# Patient Record
Sex: Female | Born: 1980 | Race: Black or African American | Hispanic: No | Marital: Married | Smoking: Never smoker
Health system: Southern US, Community
[De-identification: ages and names within clinical notes are randomized; demographics above are authoritative.]

## PROBLEM LIST (undated history)

## (undated) ENCOUNTER — Ambulatory Visit (HOSPITAL_COMMUNITY): Admission: EM | Payer: Medicare Other | Source: Home / Self Care

## (undated) DIAGNOSIS — D649 Anemia, unspecified: Secondary | ICD-10-CM

## (undated) DIAGNOSIS — F419 Anxiety disorder, unspecified: Secondary | ICD-10-CM

## (undated) DIAGNOSIS — J45909 Unspecified asthma, uncomplicated: Secondary | ICD-10-CM

## (undated) DIAGNOSIS — I209 Angina pectoris, unspecified: Secondary | ICD-10-CM

## (undated) DIAGNOSIS — R51 Headache: Secondary | ICD-10-CM

## (undated) DIAGNOSIS — G709 Myoneural disorder, unspecified: Secondary | ICD-10-CM

## (undated) DIAGNOSIS — K219 Gastro-esophageal reflux disease without esophagitis: Secondary | ICD-10-CM

## (undated) DIAGNOSIS — F603 Borderline personality disorder: Secondary | ICD-10-CM

## (undated) DIAGNOSIS — G039 Meningitis, unspecified: Secondary | ICD-10-CM

## (undated) DIAGNOSIS — F32A Depression, unspecified: Secondary | ICD-10-CM

## (undated) DIAGNOSIS — IMO0002 Reserved for concepts with insufficient information to code with codable children: Secondary | ICD-10-CM

## (undated) DIAGNOSIS — F329 Major depressive disorder, single episode, unspecified: Secondary | ICD-10-CM

## (undated) HISTORY — PX: WISDOM TOOTH EXTRACTION: SHX21

## (undated) HISTORY — PX: ABDOMINAL HYSTERECTOMY: SHX81

## (undated) HISTORY — PX: TUBAL LIGATION: SHX77

## (undated) HISTORY — PX: PERIPHERALLY INSERTED CENTRAL CATHETER INSERTION: SHX2221

---
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
## 1999-01-19 ENCOUNTER — Encounter: Admission: RE | Admit: 1999-01-19 | Discharge: 1999-01-19 | Payer: Self-pay | Admitting: Family Medicine

## 1999-02-18 ENCOUNTER — Encounter: Admission: RE | Admit: 1999-02-18 | Discharge: 1999-02-18 | Payer: Self-pay | Admitting: Sports Medicine

## 1999-03-30 ENCOUNTER — Encounter: Admission: RE | Admit: 1999-03-30 | Discharge: 1999-03-30 | Payer: Self-pay | Admitting: Family Medicine

## 1999-04-07 ENCOUNTER — Encounter: Admission: RE | Admit: 1999-04-07 | Discharge: 1999-04-07 | Payer: Self-pay | Admitting: Family Medicine

## 1999-04-13 ENCOUNTER — Encounter: Admission: RE | Admit: 1999-04-13 | Discharge: 1999-04-13 | Payer: Self-pay | Admitting: Family Medicine

## 1999-05-13 ENCOUNTER — Encounter: Admission: RE | Admit: 1999-05-13 | Discharge: 1999-05-13 | Payer: Self-pay | Admitting: Family Medicine

## 1999-07-13 ENCOUNTER — Encounter: Admission: RE | Admit: 1999-07-13 | Discharge: 1999-07-13 | Payer: Self-pay | Admitting: Family Medicine

## 1999-08-04 ENCOUNTER — Encounter: Admission: RE | Admit: 1999-08-04 | Discharge: 1999-08-04 | Payer: Self-pay | Admitting: Family Medicine

## 1999-08-31 ENCOUNTER — Encounter: Admission: RE | Admit: 1999-08-31 | Discharge: 1999-08-31 | Payer: Self-pay | Admitting: Family Medicine

## 1999-09-23 ENCOUNTER — Encounter: Admission: RE | Admit: 1999-09-23 | Discharge: 1999-09-23 | Payer: Self-pay | Admitting: Family Medicine

## 1999-10-23 ENCOUNTER — Encounter (INDEPENDENT_AMBULATORY_CARE_PROVIDER_SITE_OTHER): Payer: Self-pay | Admitting: *Deleted

## 1999-10-23 LAB — CONVERTED CEMR LAB

## 1999-10-25 ENCOUNTER — Other Ambulatory Visit: Admission: RE | Admit: 1999-10-25 | Discharge: 1999-10-25 | Payer: Self-pay | Admitting: *Deleted

## 1999-10-25 ENCOUNTER — Encounter: Admission: RE | Admit: 1999-10-25 | Discharge: 1999-10-25 | Payer: Self-pay | Admitting: Family Medicine

## 1999-12-20 ENCOUNTER — Encounter: Admission: RE | Admit: 1999-12-20 | Discharge: 1999-12-20 | Payer: Self-pay | Admitting: Sports Medicine

## 2000-01-24 ENCOUNTER — Encounter: Admission: RE | Admit: 2000-01-24 | Discharge: 2000-01-24 | Payer: Self-pay | Admitting: Family Medicine

## 2000-01-24 ENCOUNTER — Ambulatory Visit (HOSPITAL_COMMUNITY): Admission: RE | Admit: 2000-01-24 | Discharge: 2000-01-24 | Payer: Self-pay | Admitting: Family Medicine

## 2000-06-30 ENCOUNTER — Emergency Department (HOSPITAL_COMMUNITY): Admission: EM | Admit: 2000-06-30 | Discharge: 2000-06-30 | Payer: Self-pay | Admitting: *Deleted

## 2000-07-16 ENCOUNTER — Inpatient Hospital Stay (HOSPITAL_COMMUNITY): Admission: AD | Admit: 2000-07-16 | Discharge: 2000-07-16 | Payer: Self-pay | Admitting: Obstetrics

## 2000-11-20 ENCOUNTER — Encounter: Admission: RE | Admit: 2000-11-20 | Discharge: 2000-11-20 | Payer: Self-pay | Admitting: Sports Medicine

## 2000-12-13 ENCOUNTER — Encounter: Admission: RE | Admit: 2000-12-13 | Discharge: 2000-12-13 | Payer: Self-pay | Admitting: Family Medicine

## 2000-12-20 ENCOUNTER — Other Ambulatory Visit: Admission: RE | Admit: 2000-12-20 | Discharge: 2000-12-20 | Payer: Self-pay | Admitting: Family Medicine

## 2000-12-20 ENCOUNTER — Encounter: Admission: RE | Admit: 2000-12-20 | Discharge: 2000-12-20 | Payer: Self-pay | Admitting: Sports Medicine

## 2000-12-27 ENCOUNTER — Emergency Department (HOSPITAL_COMMUNITY): Admission: EM | Admit: 2000-12-27 | Discharge: 2000-12-27 | Payer: Self-pay | Admitting: Emergency Medicine

## 2001-04-23 ENCOUNTER — Encounter: Admission: RE | Admit: 2001-04-23 | Discharge: 2001-04-23 | Payer: Self-pay | Admitting: Family Medicine

## 2001-06-10 ENCOUNTER — Inpatient Hospital Stay (HOSPITAL_COMMUNITY): Admission: AD | Admit: 2001-06-10 | Discharge: 2001-06-10 | Payer: Self-pay | Admitting: *Deleted

## 2001-07-06 ENCOUNTER — Inpatient Hospital Stay (HOSPITAL_COMMUNITY): Admission: AD | Admit: 2001-07-06 | Discharge: 2001-07-06 | Payer: Self-pay | Admitting: Obstetrics and Gynecology

## 2001-07-06 ENCOUNTER — Encounter: Payer: Self-pay | Admitting: Obstetrics and Gynecology

## 2001-09-03 ENCOUNTER — Inpatient Hospital Stay (HOSPITAL_COMMUNITY): Admission: AD | Admit: 2001-09-03 | Discharge: 2001-09-03 | Payer: Self-pay | Admitting: *Deleted

## 2001-10-18 ENCOUNTER — Encounter: Payer: Self-pay | Admitting: *Deleted

## 2001-10-18 ENCOUNTER — Inpatient Hospital Stay (HOSPITAL_COMMUNITY): Admission: AD | Admit: 2001-10-18 | Discharge: 2001-10-18 | Payer: Self-pay | Admitting: Obstetrics and Gynecology

## 2001-11-01 ENCOUNTER — Inpatient Hospital Stay (HOSPITAL_COMMUNITY): Admission: RE | Admit: 2001-11-01 | Discharge: 2001-11-01 | Payer: Self-pay | Admitting: *Deleted

## 2001-11-01 ENCOUNTER — Encounter: Payer: Self-pay | Admitting: *Deleted

## 2001-12-13 ENCOUNTER — Inpatient Hospital Stay (HOSPITAL_COMMUNITY): Admission: AD | Admit: 2001-12-13 | Discharge: 2001-12-13 | Payer: Self-pay | Admitting: *Deleted

## 2002-06-07 ENCOUNTER — Inpatient Hospital Stay (HOSPITAL_COMMUNITY): Admission: AD | Admit: 2002-06-07 | Discharge: 2002-06-07 | Payer: Self-pay | Admitting: *Deleted

## 2002-06-21 ENCOUNTER — Inpatient Hospital Stay (HOSPITAL_COMMUNITY): Admission: AD | Admit: 2002-06-21 | Discharge: 2002-06-24 | Payer: Self-pay | Admitting: Obstetrics

## 2003-01-24 ENCOUNTER — Encounter: Payer: Self-pay | Admitting: Emergency Medicine

## 2003-01-24 ENCOUNTER — Emergency Department (HOSPITAL_COMMUNITY): Admission: EM | Admit: 2003-01-24 | Discharge: 2003-01-25 | Payer: Self-pay | Admitting: Emergency Medicine

## 2003-01-25 ENCOUNTER — Encounter: Payer: Self-pay | Admitting: Emergency Medicine

## 2003-12-08 ENCOUNTER — Inpatient Hospital Stay (HOSPITAL_COMMUNITY): Admission: AD | Admit: 2003-12-08 | Discharge: 2003-12-08 | Payer: Self-pay | Admitting: Obstetrics

## 2003-12-14 ENCOUNTER — Inpatient Hospital Stay (HOSPITAL_COMMUNITY): Admission: AD | Admit: 2003-12-14 | Discharge: 2003-12-16 | Payer: Self-pay | Admitting: Obstetrics

## 2003-12-15 ENCOUNTER — Encounter (INDEPENDENT_AMBULATORY_CARE_PROVIDER_SITE_OTHER): Payer: Self-pay | Admitting: *Deleted

## 2005-06-12 ENCOUNTER — Inpatient Hospital Stay (HOSPITAL_COMMUNITY): Admission: AD | Admit: 2005-06-12 | Discharge: 2005-06-13 | Payer: Self-pay | Admitting: Obstetrics

## 2005-07-03 ENCOUNTER — Emergency Department (HOSPITAL_COMMUNITY): Admission: EM | Admit: 2005-07-03 | Discharge: 2005-07-03 | Payer: Self-pay | Admitting: Family Medicine

## 2005-07-17 ENCOUNTER — Emergency Department (HOSPITAL_COMMUNITY): Admission: EM | Admit: 2005-07-17 | Discharge: 2005-07-17 | Payer: Self-pay | Admitting: Family Medicine

## 2005-10-21 ENCOUNTER — Emergency Department (HOSPITAL_COMMUNITY): Admission: EM | Admit: 2005-10-21 | Discharge: 2005-10-21 | Payer: Self-pay | Admitting: Emergency Medicine

## 2006-02-14 ENCOUNTER — Emergency Department (HOSPITAL_COMMUNITY): Admission: EM | Admit: 2006-02-14 | Discharge: 2006-02-15 | Payer: Self-pay | Admitting: Emergency Medicine

## 2006-04-05 ENCOUNTER — Encounter: Admission: RE | Admit: 2006-04-05 | Discharge: 2006-04-05 | Payer: Self-pay | Admitting: Internal Medicine

## 2006-06-22 ENCOUNTER — Encounter (INDEPENDENT_AMBULATORY_CARE_PROVIDER_SITE_OTHER): Payer: Self-pay | Admitting: *Deleted

## 2006-07-02 ENCOUNTER — Emergency Department (HOSPITAL_COMMUNITY): Admission: EM | Admit: 2006-07-02 | Discharge: 2006-07-02 | Payer: Self-pay | Admitting: Emergency Medicine

## 2006-07-03 ENCOUNTER — Emergency Department (HOSPITAL_COMMUNITY): Admission: EM | Admit: 2006-07-03 | Discharge: 2006-07-03 | Payer: Self-pay | Admitting: Family Medicine

## 2007-05-03 ENCOUNTER — Emergency Department (HOSPITAL_COMMUNITY): Admission: EM | Admit: 2007-05-03 | Discharge: 2007-05-03 | Payer: Self-pay | Admitting: Emergency Medicine

## 2007-06-18 ENCOUNTER — Emergency Department (HOSPITAL_COMMUNITY): Admission: EM | Admit: 2007-06-18 | Discharge: 2007-06-18 | Payer: Self-pay | Admitting: Emergency Medicine

## 2008-01-30 ENCOUNTER — Emergency Department (HOSPITAL_COMMUNITY): Admission: EM | Admit: 2008-01-30 | Discharge: 2008-01-30 | Payer: Self-pay | Admitting: Emergency Medicine

## 2008-01-31 ENCOUNTER — Ambulatory Visit: Payer: Self-pay | Admitting: Gynecology

## 2008-02-16 ENCOUNTER — Emergency Department (HOSPITAL_COMMUNITY): Admission: EM | Admit: 2008-02-16 | Discharge: 2008-02-16 | Payer: Self-pay | Admitting: Emergency Medicine

## 2008-04-08 ENCOUNTER — Ambulatory Visit: Payer: Self-pay | Admitting: Gynecology

## 2008-04-08 ENCOUNTER — Encounter: Payer: Self-pay | Admitting: Gynecology

## 2008-04-08 ENCOUNTER — Other Ambulatory Visit: Admission: RE | Admit: 2008-04-08 | Discharge: 2008-04-08 | Payer: Self-pay | Admitting: Gynecology

## 2008-04-29 ENCOUNTER — Inpatient Hospital Stay (HOSPITAL_COMMUNITY): Admission: RE | Admit: 2008-04-29 | Discharge: 2008-05-01 | Payer: Self-pay | Admitting: Gynecology

## 2008-04-29 ENCOUNTER — Encounter: Payer: Self-pay | Admitting: Gynecology

## 2008-04-29 ENCOUNTER — Ambulatory Visit: Payer: Self-pay | Admitting: Gynecology

## 2008-05-04 ENCOUNTER — Ambulatory Visit: Payer: Self-pay | Admitting: Gynecology

## 2008-05-18 ENCOUNTER — Ambulatory Visit: Payer: Self-pay | Admitting: Gynecology

## 2008-06-09 ENCOUNTER — Ambulatory Visit: Payer: Self-pay | Admitting: Gynecology

## 2008-09-29 ENCOUNTER — Observation Stay (HOSPITAL_COMMUNITY): Admission: EM | Admit: 2008-09-29 | Discharge: 2008-09-30 | Payer: Self-pay | Admitting: Emergency Medicine

## 2008-09-30 ENCOUNTER — Encounter: Payer: Self-pay | Admitting: Cardiology

## 2009-03-19 ENCOUNTER — Emergency Department (HOSPITAL_COMMUNITY): Admission: EM | Admit: 2009-03-19 | Discharge: 2009-03-19 | Payer: Self-pay | Admitting: Family Medicine

## 2009-08-15 ENCOUNTER — Emergency Department (HOSPITAL_COMMUNITY): Admission: EM | Admit: 2009-08-15 | Discharge: 2009-08-16 | Payer: Self-pay | Admitting: Emergency Medicine

## 2009-11-29 ENCOUNTER — Emergency Department (HOSPITAL_COMMUNITY): Admission: EM | Admit: 2009-11-29 | Discharge: 2009-11-29 | Payer: Self-pay | Admitting: Emergency Medicine

## 2010-05-08 ENCOUNTER — Emergency Department (HOSPITAL_COMMUNITY)
Admission: EM | Admit: 2010-05-08 | Discharge: 2010-05-08 | Payer: Self-pay | Source: Home / Self Care | Admitting: Emergency Medicine

## 2010-05-09 LAB — BASIC METABOLIC PANEL
BUN: 7 mg/dL (ref 6–23)
CO2: 27 mEq/L (ref 19–32)
Calcium: 9.4 mg/dL (ref 8.4–10.5)
Chloride: 107 mEq/L (ref 96–112)
Creatinine, Ser: 0.74 mg/dL (ref 0.4–1.2)
GFR calc Af Amer: >60 mL/min (ref >60)
GFR calc non Af Amer: >60 mL/min (ref >60)
Glucose, Bld: 76 mg/dL (ref 70–99)
Potassium: 3.3 mEq/L — ABNORMAL LOW (ref 3.5–5.1)
Sodium: 140 mEq/L (ref 135–145)

## 2010-05-09 LAB — CBC
HCT: 39 % (ref 36.0–46.0)
Hemoglobin: 13.2 g/dL (ref 12.0–15.0)
MCH: 30.4 pg (ref 26.0–34.0)
MCHC: 33.8 g/dL (ref 30.0–36.0)
MCV: 89.9 fL (ref 78.0–100.0)
Platelets: 230 10*3/uL (ref 150–400)
RBC: 4.34 MIL/uL (ref 3.87–5.11)
RDW: 12.2 % (ref 11.5–15.5)
WBC: 3.3 10*3/uL — ABNORMAL LOW (ref 4.0–10.5)

## 2010-05-09 LAB — POCT CARDIAC MARKERS
CKMB, poc: <1 ng/mL — ABNORMAL LOW (ref 1.0–8.0)
CKMB, poc: <1 ng/mL — ABNORMAL LOW (ref 1.0–8.0)
Myoglobin, poc: 56.6 ng/mL (ref 12–200)
Myoglobin, poc: 44.9 ng/mL (ref 12–200)
Troponin i, poc: <0.05 ng/mL (ref 0.00–0.09)
Troponin i, poc: <0.05 ng/mL (ref 0.00–0.09)

## 2010-05-09 LAB — URINALYSIS, ROUTINE W REFLEX MICROSCOPIC
Bilirubin Urine: NEGATIVE
Hgb urine dipstick: NEGATIVE
Ketones, ur: NEGATIVE mg/dL
Nitrite: NEGATIVE
Protein, ur: NEGATIVE mg/dL
Specific Gravity, Urine: 1.022 (ref 1.005–1.030)
Urine Glucose, Fasting: NEGATIVE mg/dL
Urobilinogen, UA: 0.2 mg/dL (ref 0.0–1.0)
pH: 7 (ref 5.0–8.0)

## 2010-05-09 LAB — DIFFERENTIAL
Basophils Absolute: 0 10*3/uL (ref 0.0–0.1)
Basophils Relative: 1 % (ref 0–1)
Eosinophils Absolute: 0.2 10*3/uL (ref 0.0–0.7)
Eosinophils Relative: 6 % — ABNORMAL HIGH (ref 0–5)
Lymphocytes Relative: 44 % (ref 12–46)
Lymphs Abs: 1.5 10*3/uL (ref 0.7–4.0)
Monocytes Absolute: 0.2 10*3/uL (ref 0.1–1.0)
Monocytes Relative: 7 % (ref 3–12)
Neutro Abs: 1.4 10*3/uL — ABNORMAL LOW (ref 1.7–7.7)
Neutrophils Relative %: 42 % — ABNORMAL LOW (ref 43–77)

## 2010-05-09 LAB — POCT PREGNANCY, URINE: Preg Test, Ur: NEGATIVE

## 2010-05-15 ENCOUNTER — Encounter: Payer: Self-pay | Admitting: Internal Medicine

## 2010-06-27 ENCOUNTER — Emergency Department (HOSPITAL_COMMUNITY)
Admission: EM | Admit: 2010-06-27 | Discharge: 2010-06-28 | Disposition: A | Discharge status: Home / Self Care | Payer: Medicaid Other | Attending: Emergency Medicine | Admitting: Emergency Medicine

## 2010-06-27 DIAGNOSIS — T50901A Poisoning by unspecified drugs, medicaments and biological substances, accidental (unintentional), initial encounter: Secondary | ICD-10-CM | POA: Insufficient documentation

## 2010-06-27 DIAGNOSIS — F3289 Other specified depressive episodes: Secondary | ICD-10-CM | POA: Insufficient documentation

## 2010-06-27 DIAGNOSIS — F329 Major depressive disorder, single episode, unspecified: Secondary | ICD-10-CM | POA: Insufficient documentation

## 2010-06-27 DIAGNOSIS — T50904A Poisoning by unspecified drugs, medicaments and biological substances, undetermined, initial encounter: Secondary | ICD-10-CM | POA: Insufficient documentation

## 2010-06-27 LAB — COMPREHENSIVE METABOLIC PANEL
ALT: <8 U/L (ref 0–35)
AST: 14 U/L (ref 0–37)
Albumin: 3.1 g/dL — ABNORMAL LOW (ref 3.5–5.2)
Alkaline Phosphatase: 56 U/L (ref 39–117)
BUN: 7 mg/dL (ref 6–23)
CO2: 26 mEq/L (ref 19–32)
Calcium: 8.1 mg/dL — ABNORMAL LOW (ref 8.4–10.5)
Chloride: 109 mEq/L (ref 96–112)
Creatinine, Ser: 0.63 mg/dL (ref 0.4–1.2)
GFR calc Af Amer: >60 mL/min (ref >60)
GFR calc non Af Amer: >60 mL/min (ref >60)
Glucose, Bld: 97 mg/dL (ref 70–99)
Potassium: 3.3 mEq/L — ABNORMAL LOW (ref 3.5–5.1)
Sodium: 139 mEq/L (ref 135–145)
Total Bilirubin: 0.1 mg/dL — ABNORMAL LOW (ref 0.3–1.2)
Total Protein: 6.1 g/dL (ref 6.0–8.3)

## 2010-06-27 LAB — URINALYSIS, ROUTINE W REFLEX MICROSCOPIC
Bilirubin Urine: NEGATIVE
Glucose, UA: NEGATIVE mg/dL
Hgb urine dipstick: NEGATIVE
Ketones, ur: NEGATIVE mg/dL
Nitrite: NEGATIVE
Protein, ur: NEGATIVE mg/dL
Specific Gravity, Urine: 1.026 (ref 1.005–1.030)
Urobilinogen, UA: 0.2 mg/dL (ref 0.0–1.0)
pH: 7 (ref 5.0–8.0)

## 2010-06-27 LAB — DIFFERENTIAL
Basophils Absolute: 0 10*3/uL (ref 0.0–0.1)
Basophils Relative: 1 % (ref 0–1)
Eosinophils Absolute: 0.2 10*3/uL (ref 0.0–0.7)
Eosinophils Relative: 5 % (ref 0–5)
Lymphocytes Relative: 40 % (ref 12–46)
Lymphs Abs: 1.7 10*3/uL (ref 0.7–4.0)
Monocytes Absolute: 0.3 10*3/uL (ref 0.1–1.0)
Monocytes Relative: 6 % (ref 3–12)
Neutro Abs: 2.1 10*3/uL (ref 1.7–7.7)
Neutrophils Relative %: 49 % (ref 43–77)

## 2010-06-27 LAB — ACETAMINOPHEN LEVEL
Acetaminophen (Tylenol), Serum: <10 ug/mL — ABNORMAL LOW (ref 10–30)
Acetaminophen (Tylenol), Serum: <10 ug/mL — ABNORMAL LOW (ref 10–30)

## 2010-06-27 LAB — ETHANOL: Alcohol, Ethyl (B): <5 mg/dL (ref 0–10)

## 2010-06-27 LAB — CBC
HCT: 33.3 % — ABNORMAL LOW (ref 36.0–46.0)
Hemoglobin: 10.8 g/dL — ABNORMAL LOW (ref 12.0–15.0)
MCH: 28.7 pg (ref 26.0–34.0)
MCHC: 32.4 g/dL (ref 30.0–36.0)
MCV: 88.6 fL (ref 78.0–100.0)
Platelets: 179 10*3/uL (ref 150–400)
RBC: 3.76 MIL/uL — ABNORMAL LOW (ref 3.87–5.11)
RDW: 12.2 % (ref 11.5–15.5)
WBC: 4.3 10*3/uL (ref 4.0–10.5)

## 2010-06-27 LAB — POCT PREGNANCY, URINE: Preg Test, Ur: NEGATIVE

## 2010-06-27 LAB — RAPID URINE DRUG SCREEN, HOSP PERFORMED
Amphetamines: NOT DETECTED
Barbiturates: NOT DETECTED
Benzodiazepines: POSITIVE — AB
Cocaine: NOT DETECTED
Opiates: NOT DETECTED
Tetrahydrocannabinol: NOT DETECTED

## 2010-06-27 LAB — SALICYLATE LEVEL: Salicylate Lvl: <4 mg/dL (ref 2.8–20.0)

## 2010-06-28 DIAGNOSIS — F339 Major depressive disorder, recurrent, unspecified: Secondary | ICD-10-CM

## 2010-07-08 LAB — DIFFERENTIAL
Basophils Absolute: 0 10*3/uL (ref 0.0–0.1)
Basophils Relative: 1 % (ref 0–1)
Eosinophils Absolute: 0.3 10*3/uL (ref 0.0–0.7)
Eosinophils Relative: 5 % (ref 0–5)
Lymphocytes Relative: 34 % (ref 12–46)
Lymphs Abs: 2 10*3/uL (ref 0.7–4.0)
Monocytes Absolute: 0.3 10*3/uL (ref 0.1–1.0)
Monocytes Relative: 6 % (ref 3–12)
Neutro Abs: 3.2 10*3/uL (ref 1.7–7.7)
Neutrophils Relative %: 55 % (ref 43–77)

## 2010-07-08 LAB — BASIC METABOLIC PANEL
BUN: 4 mg/dL — ABNORMAL LOW (ref 6–23)
CO2: 25 mEq/L (ref 19–32)
Calcium: 9.4 mg/dL (ref 8.4–10.5)
Chloride: 105 mEq/L (ref 96–112)
Creatinine, Ser: 0.71 mg/dL (ref 0.4–1.2)
GFR calc Af Amer: >60 mL/min (ref >60)
GFR calc non Af Amer: >60 mL/min (ref >60)
Glucose, Bld: 93 mg/dL (ref 70–99)
Potassium: 3.8 mEq/L (ref 3.5–5.1)
Sodium: 136 mEq/L (ref 135–145)

## 2010-07-08 LAB — TROPONIN I: Troponin I: 0.01 ng/mL (ref 0.00–0.06)

## 2010-07-08 LAB — CBC
HCT: 37 % (ref 36.0–46.0)
Hemoglobin: 12.7 g/dL (ref 12.0–15.0)
MCH: 30.2 pg (ref 26.0–34.0)
MCHC: 34.3 g/dL (ref 30.0–36.0)
MCV: 88.1 fL (ref 78.0–100.0)
Platelets: 227 10*3/uL (ref 150–400)
RBC: 4.2 MIL/uL (ref 3.87–5.11)
RDW: 12.3 % (ref 11.5–15.5)
WBC: 5.9 10*3/uL (ref 4.0–10.5)

## 2010-07-08 LAB — CK TOTAL AND CKMB (NOT AT ARMC)
CK, MB: 1.1 ng/mL (ref 0.3–4.0)
Relative Index: INVALID (ref 0.0–2.5)
Total CK: 79 U/L (ref 7–177)

## 2010-07-08 LAB — POCT CARDIAC MARKERS
CKMB, poc: <1 ng/mL — ABNORMAL LOW (ref 1.0–8.0)
Myoglobin, poc: 45.5 ng/mL (ref 12–200)
Troponin i, poc: <0.05 ng/mL (ref 0.00–0.09)

## 2010-07-12 LAB — DIFFERENTIAL
Basophils Absolute: 0 10*3/uL (ref 0.0–0.1)
Basophils Relative: 1 % (ref 0–1)
Eosinophils Absolute: 0.4 10*3/uL (ref 0.0–0.7)
Eosinophils Relative: 6 % — ABNORMAL HIGH (ref 0–5)
Lymphocytes Relative: 41 % (ref 12–46)
Lymphs Abs: 2.6 10*3/uL (ref 0.7–4.0)
Monocytes Absolute: 0.6 10*3/uL (ref 0.1–1.0)
Monocytes Relative: 9 % (ref 3–12)
Neutro Abs: 2.7 10*3/uL (ref 1.7–7.7)
Neutrophils Relative %: 43 % (ref 43–77)

## 2010-07-12 LAB — CBC
HCT: 36.5 % (ref 36.0–46.0)
Hemoglobin: 12.7 g/dL (ref 12.0–15.0)
MCHC: 34.8 g/dL (ref 30.0–36.0)
MCV: 90.8 fL (ref 78.0–100.0)
Platelets: 248 10*3/uL (ref 150–400)
RBC: 4.02 MIL/uL (ref 3.87–5.11)
RDW: 12.3 % (ref 11.5–15.5)
WBC: 6.2 10*3/uL (ref 4.0–10.5)

## 2010-07-12 LAB — POCT CARDIAC MARKERS
CKMB, poc: <1 ng/mL — ABNORMAL LOW (ref 1.0–8.0)
Myoglobin, poc: 35.4 ng/mL (ref 12–200)
Troponin i, poc: <0.05 ng/mL (ref 0.00–0.09)

## 2010-07-12 LAB — BASIC METABOLIC PANEL
BUN: 12 mg/dL (ref 6–23)
CO2: 28 mEq/L (ref 19–32)
Calcium: 9.4 mg/dL (ref 8.4–10.5)
Chloride: 106 mEq/L (ref 96–112)
Creatinine, Ser: 0.81 mg/dL (ref 0.4–1.2)
GFR calc Af Amer: >60 mL/min (ref >60)
GFR calc non Af Amer: >60 mL/min (ref >60)
Glucose, Bld: 94 mg/dL (ref 70–99)
Potassium: 3.4 mEq/L — ABNORMAL LOW (ref 3.5–5.1)
Sodium: 137 mEq/L (ref 135–145)

## 2010-08-01 LAB — COMPREHENSIVE METABOLIC PANEL
ALT: 9 U/L (ref 0–35)
AST: 26 U/L (ref 0–37)
Albumin: 3.7 g/dL (ref 3.5–5.2)
Alkaline Phosphatase: 60 U/L (ref 39–117)
BUN: 5 mg/dL — ABNORMAL LOW (ref 6–23)
CO2: 25 mEq/L (ref 19–32)
Calcium: 8.9 mg/dL (ref 8.4–10.5)
Chloride: 110 mEq/L (ref 96–112)
Creatinine, Ser: 0.74 mg/dL (ref 0.4–1.2)
GFR calc Af Amer: >60 mL/min (ref >60)
GFR calc non Af Amer: >60 mL/min (ref >60)
Glucose, Bld: 117 mg/dL — ABNORMAL HIGH (ref 70–99)
Potassium: 3.5 mEq/L (ref 3.5–5.1)
Sodium: 139 mEq/L (ref 135–145)
Total Bilirubin: 0.7 mg/dL (ref 0.3–1.2)
Total Protein: 6.9 g/dL (ref 6.0–8.3)

## 2010-08-01 LAB — DIFFERENTIAL
Basophils Absolute: 0 10*3/uL (ref 0.0–0.1)
Basophils Absolute: 0 10*3/uL (ref 0.0–0.1)
Basophils Relative: 1 % (ref 0–1)
Basophils Relative: 1 % (ref 0–1)
Eosinophils Absolute: 0.2 10*3/uL (ref 0.0–0.7)
Eosinophils Absolute: 0.2 10*3/uL (ref 0.0–0.7)
Eosinophils Relative: 5 % (ref 0–5)
Eosinophils Relative: 5 % (ref 0–5)
Lymphocytes Relative: 48 % — ABNORMAL HIGH (ref 12–46)
Lymphocytes Relative: 50 % — ABNORMAL HIGH (ref 12–46)
Lymphs Abs: 2.1 10*3/uL (ref 0.7–4.0)
Lymphs Abs: 2.2 10*3/uL (ref 0.7–4.0)
Monocytes Absolute: 0.3 10*3/uL (ref 0.1–1.0)
Monocytes Absolute: 0.4 10*3/uL (ref 0.1–1.0)
Monocytes Relative: 7 % (ref 3–12)
Monocytes Relative: 8 % (ref 3–12)
Neutro Abs: 1.6 10*3/uL — ABNORMAL LOW (ref 1.7–7.7)
Neutro Abs: 1.8 10*3/uL (ref 1.7–7.7)
Neutrophils Relative %: 38 % — ABNORMAL LOW (ref 43–77)
Neutrophils Relative %: 39 % — ABNORMAL LOW (ref 43–77)

## 2010-08-01 LAB — CBC
HCT: 35.7 % — ABNORMAL LOW (ref 36.0–46.0)
HCT: 37.5 % (ref 36.0–46.0)
Hemoglobin: 12.3 g/dL (ref 12.0–15.0)
Hemoglobin: 13 g/dL (ref 12.0–15.0)
MCHC: 34.4 g/dL (ref 30.0–36.0)
MCHC: 34.7 g/dL (ref 30.0–36.0)
MCV: 88.3 fL (ref 78.0–100.0)
MCV: 88.5 fL (ref 78.0–100.0)
Platelets: 205 10*3/uL (ref 150–400)
Platelets: 226 10*3/uL (ref 150–400)
RBC: 4.04 MIL/uL (ref 3.87–5.11)
RBC: 4.24 MIL/uL (ref 3.87–5.11)
RDW: 12.4 % (ref 11.5–15.5)
RDW: 12.8 % (ref 11.5–15.5)
WBC: 4.3 10*3/uL (ref 4.0–10.5)
WBC: 4.6 10*3/uL (ref 4.0–10.5)

## 2010-08-01 LAB — BASIC METABOLIC PANEL
BUN: 7 mg/dL (ref 6–23)
CO2: 24 mEq/L (ref 19–32)
Calcium: 9.3 mg/dL (ref 8.4–10.5)
Chloride: 106 mEq/L (ref 96–112)
Creatinine, Ser: 0.74 mg/dL (ref 0.4–1.2)
GFR calc Af Amer: >60 mL/min (ref >60)
GFR calc non Af Amer: >60 mL/min (ref >60)
Glucose, Bld: 98 mg/dL (ref 70–99)
Potassium: 3.4 mEq/L — ABNORMAL LOW (ref 3.5–5.1)
Sodium: 138 mEq/L (ref 135–145)

## 2010-08-01 LAB — POCT CARDIAC MARKERS
CKMB, poc: <1 ng/mL — ABNORMAL LOW (ref 1.0–8.0)
CKMB, poc: <1 ng/mL — ABNORMAL LOW (ref 1.0–8.0)
Myoglobin, poc: 105 ng/mL (ref 12–200)
Myoglobin, poc: 59.9 ng/mL (ref 12–200)
Troponin i, poc: <0.05 ng/mL (ref 0.00–0.09)
Troponin i, poc: <0.05 ng/mL (ref 0.00–0.09)

## 2010-08-01 LAB — CK TOTAL AND CKMB (NOT AT ARMC)
CK, MB: 1.3 ng/mL (ref 0.3–4.0)
Relative Index: 0.6 (ref 0.0–2.5)
Total CK: 219 U/L — ABNORMAL HIGH (ref 7–177)

## 2010-08-01 LAB — CARDIAC PANEL(CRET KIN+CKTOT+MB+TROPI)
CK, MB: 1 ng/mL (ref 0.3–4.0)
CK, MB: 1.2 ng/mL (ref 0.3–4.0)
Relative Index: 0.5 (ref 0.0–2.5)
Relative Index: 0.6 (ref 0.0–2.5)
Total CK: 206 U/L — ABNORMAL HIGH (ref 7–177)
Total CK: 211 U/L — ABNORMAL HIGH (ref 7–177)
Troponin I: 0.01 ng/mL (ref 0.00–0.06)
Troponin I: <0.01 ng/mL (ref 0.00–0.06)

## 2010-08-01 LAB — CK: Total CK: 219 U/L — ABNORMAL HIGH (ref 7–177)

## 2010-08-01 LAB — LIPID PANEL
Cholesterol: 132 mg/dL (ref 0–200)
HDL: 43 mg/dL (ref >39)
LDL Cholesterol: 82 mg/dL (ref 0–99)
Total CHOL/HDL Ratio: 3.1 RATIO
Triglycerides: 35 mg/dL (ref <150)
VLDL: 7 mg/dL (ref 0–40)

## 2010-08-01 LAB — C-REACTIVE PROTEIN: CRP: 0 mg/dL — ABNORMAL LOW (ref <0.6)

## 2010-08-01 LAB — TROPONIN I: Troponin I: 0.01 ng/mL (ref 0.00–0.06)

## 2010-08-01 LAB — APTT: aPTT: 32 seconds (ref 24–37)

## 2010-08-01 LAB — PROTIME-INR
INR: 1 (ref 0.00–1.49)
Prothrombin Time: 13.9 seconds (ref 11.6–15.2)

## 2010-08-08 LAB — CBC
HCT: 27.5 % — ABNORMAL LOW (ref 36.0–46.0)
HCT: 34.3 % — ABNORMAL LOW (ref 36.0–46.0)
Hemoglobin: 11.1 g/dL — ABNORMAL LOW (ref 12.0–15.0)
Hemoglobin: 9.3 g/dL — ABNORMAL LOW (ref 12.0–15.0)
MCHC: 32.5 g/dL (ref 30.0–36.0)
MCHC: 33.8 g/dL (ref 30.0–36.0)
MCV: 84.1 fL (ref 78.0–100.0)
MCV: 84.5 fL (ref 78.0–100.0)
Platelets: 232 10*3/uL (ref 150–400)
Platelets: 275 10*3/uL (ref 150–400)
RBC: 3.25 MIL/uL — ABNORMAL LOW (ref 3.87–5.11)
RBC: 4.07 MIL/uL (ref 3.87–5.11)
RDW: 16.3 % — ABNORMAL HIGH (ref 11.5–15.5)
RDW: 17.2 % — ABNORMAL HIGH (ref 11.5–15.5)
WBC: 11 10*3/uL — ABNORMAL HIGH (ref 4.0–10.5)
WBC: 4.2 10*3/uL (ref 4.0–10.5)

## 2010-08-08 LAB — HCG, SERUM, QUALITATIVE: Preg, Serum: NEGATIVE

## 2010-08-10 ENCOUNTER — Emergency Department (HOSPITAL_COMMUNITY)
Admission: EM | Admit: 2010-08-10 | Discharge: 2010-08-10 | Disposition: A | Discharge status: Home / Self Care | Payer: Medicaid Other | Attending: Emergency Medicine | Admitting: Emergency Medicine

## 2010-08-10 ENCOUNTER — Emergency Department (HOSPITAL_COMMUNITY): Payer: Medicaid Other

## 2010-08-10 DIAGNOSIS — R002 Palpitations: Secondary | ICD-10-CM | POA: Insufficient documentation

## 2010-08-10 DIAGNOSIS — R0602 Shortness of breath: Secondary | ICD-10-CM | POA: Insufficient documentation

## 2010-08-10 DIAGNOSIS — R079 Chest pain, unspecified: Secondary | ICD-10-CM | POA: Insufficient documentation

## 2010-08-10 LAB — COMPREHENSIVE METABOLIC PANEL
ALT: 15 U/L (ref 0–35)
AST: 23 U/L (ref 0–37)
Albumin: 4 g/dL (ref 3.5–5.2)
Alkaline Phosphatase: 76 U/L (ref 39–117)
BUN: 9 mg/dL (ref 6–23)
CO2: 25 mEq/L (ref 19–32)
Calcium: 9.4 mg/dL (ref 8.4–10.5)
Chloride: 105 mEq/L (ref 96–112)
Creatinine, Ser: 0.59 mg/dL (ref 0.4–1.2)
GFR calc Af Amer: >60 mL/min (ref >60)
GFR calc non Af Amer: >60 mL/min (ref >60)
Glucose, Bld: 97 mg/dL (ref 70–99)
Potassium: 3.8 mEq/L (ref 3.5–5.1)
Sodium: 135 mEq/L (ref 135–145)
Total Bilirubin: 0.4 mg/dL (ref 0.3–1.2)
Total Protein: 8.1 g/dL (ref 6.0–8.3)

## 2010-08-10 LAB — CBC
HCT: 39.8 % (ref 36.0–46.0)
Hemoglobin: 13.7 g/dL (ref 12.0–15.0)
MCH: 29.9 pg (ref 26.0–34.0)
MCHC: 34.4 g/dL (ref 30.0–36.0)
MCV: 86.9 fL (ref 78.0–100.0)
Platelets: 252 10*3/uL (ref 150–400)
RBC: 4.58 MIL/uL (ref 3.87–5.11)
RDW: 12.3 % (ref 11.5–15.5)
WBC: 5.9 10*3/uL (ref 4.0–10.5)

## 2010-08-10 LAB — DIFFERENTIAL
Basophils Absolute: 0 10*3/uL (ref 0.0–0.1)
Basophils Relative: 1 % (ref 0–1)
Eosinophils Absolute: 0.4 10*3/uL (ref 0.0–0.7)
Eosinophils Relative: 6 % — ABNORMAL HIGH (ref 0–5)
Lymphocytes Relative: 49 % — ABNORMAL HIGH (ref 12–46)
Lymphs Abs: 2.9 10*3/uL (ref 0.7–4.0)
Monocytes Absolute: 0.4 10*3/uL (ref 0.1–1.0)
Monocytes Relative: 7 % (ref 3–12)
Neutro Abs: 2.2 10*3/uL (ref 1.7–7.7)
Neutrophils Relative %: 37 % — ABNORMAL LOW (ref 43–77)

## 2010-08-10 LAB — POCT CARDIAC MARKERS
CKMB, poc: <1 ng/mL — ABNORMAL LOW (ref 1.0–8.0)
Myoglobin, poc: 34.2 ng/mL (ref 12–200)
Troponin i, poc: <0.05 ng/mL (ref 0.00–0.09)

## 2010-08-21 ENCOUNTER — Emergency Department (HOSPITAL_COMMUNITY)
Admission: EM | Admit: 2010-08-21 | Discharge: 2010-08-22 | Disposition: A | Discharge status: Home / Self Care | Payer: Medicaid Other | Attending: Emergency Medicine | Admitting: Emergency Medicine

## 2010-08-21 DIAGNOSIS — T481X4A Poisoning by skeletal muscle relaxants [neuromuscular blocking agents], undetermined, initial encounter: Secondary | ICD-10-CM | POA: Insufficient documentation

## 2010-08-21 DIAGNOSIS — Z79899 Other long term (current) drug therapy: Secondary | ICD-10-CM | POA: Insufficient documentation

## 2010-08-21 DIAGNOSIS — F341 Dysthymic disorder: Secondary | ICD-10-CM | POA: Insufficient documentation

## 2010-08-21 DIAGNOSIS — T43294A Poisoning by other antidepressants, undetermined, initial encounter: Secondary | ICD-10-CM | POA: Insufficient documentation

## 2010-08-21 DIAGNOSIS — T43591A Poisoning by other antipsychotics and neuroleptics, accidental (unintentional), initial encounter: Secondary | ICD-10-CM | POA: Insufficient documentation

## 2010-08-21 DIAGNOSIS — I209 Angina pectoris, unspecified: Secondary | ICD-10-CM | POA: Insufficient documentation

## 2010-08-21 LAB — ACETAMINOPHEN LEVEL
Acetaminophen (Tylenol), Serum: <10 ug/mL — ABNORMAL LOW (ref 10–30)
Acetaminophen (Tylenol), Serum: <10 ug/mL — ABNORMAL LOW (ref 10–30)

## 2010-08-21 LAB — DIFFERENTIAL
Basophils Absolute: 0 10*3/uL (ref 0.0–0.1)
Basophils Relative: 1 % (ref 0–1)
Eosinophils Absolute: 0.3 10*3/uL (ref 0.0–0.7)
Eosinophils Relative: 7 % — ABNORMAL HIGH (ref 0–5)
Lymphocytes Relative: 43 % (ref 12–46)
Lymphs Abs: 2 10*3/uL (ref 0.7–4.0)
Monocytes Absolute: 0.4 10*3/uL (ref 0.1–1.0)
Monocytes Relative: 8 % (ref 3–12)
Neutro Abs: 1.9 10*3/uL (ref 1.7–7.7)
Neutrophils Relative %: 42 % — ABNORMAL LOW (ref 43–77)

## 2010-08-21 LAB — CBC
HCT: 38.1 % (ref 36.0–46.0)
Hemoglobin: 12.8 g/dL (ref 12.0–15.0)
MCH: 29.3 pg (ref 26.0–34.0)
MCHC: 33.6 g/dL (ref 30.0–36.0)
MCV: 87.2 fL (ref 78.0–100.0)
Platelets: 229 10*3/uL (ref 150–400)
RBC: 4.37 MIL/uL (ref 3.87–5.11)
RDW: 12.4 % (ref 11.5–15.5)
WBC: 4.6 10*3/uL (ref 4.0–10.5)

## 2010-08-21 LAB — COMPREHENSIVE METABOLIC PANEL
ALT: 12 U/L (ref 0–35)
AST: 20 U/L (ref 0–37)
Albumin: 3.6 g/dL (ref 3.5–5.2)
Alkaline Phosphatase: 69 U/L (ref 39–117)
BUN: 8 mg/dL (ref 6–23)
CO2: 28 mEq/L (ref 19–32)
Calcium: 9 mg/dL (ref 8.4–10.5)
Chloride: 107 mEq/L (ref 96–112)
Creatinine, Ser: 0.65 mg/dL (ref 0.4–1.2)
GFR calc Af Amer: >60 mL/min (ref >60)
GFR calc non Af Amer: >60 mL/min (ref >60)
Glucose, Bld: 98 mg/dL (ref 70–99)
Potassium: 3.4 mEq/L — ABNORMAL LOW (ref 3.5–5.1)
Sodium: 140 mEq/L (ref 135–145)
Total Bilirubin: 0.3 mg/dL (ref 0.3–1.2)
Total Protein: 7.5 g/dL (ref 6.0–8.3)

## 2010-08-21 LAB — POCT PREGNANCY, URINE: Preg Test, Ur: NEGATIVE

## 2010-08-21 LAB — ETHANOL: Alcohol, Ethyl (B): <5 mg/dL (ref 0–10)

## 2010-08-21 LAB — RAPID URINE DRUG SCREEN, HOSP PERFORMED
Amphetamines: NOT DETECTED
Barbiturates: NOT DETECTED
Benzodiazepines: POSITIVE — AB
Cocaine: NOT DETECTED
Opiates: NOT DETECTED
Tetrahydrocannabinol: NOT DETECTED

## 2010-08-21 LAB — SALICYLATE LEVEL: Salicylate Lvl: <4 mg/dL (ref 2.8–20.0)

## 2010-08-22 DIAGNOSIS — F329 Major depressive disorder, single episode, unspecified: Secondary | ICD-10-CM

## 2010-08-22 DIAGNOSIS — F3289 Other specified depressive episodes: Secondary | ICD-10-CM

## 2010-08-23 NOTE — Consult Note (Signed)
  NAMEKOHANA, AMBLE           ACCOUNT NO.:  [id number]  MEDICAL RECORD NO.:  [id number]           PATIENT TYPE:  E  LOCATION:  WLED                         FACILITY:  [facility]  PHYSICIAN:  Eulogio Ditch, MD DATE OF BIRTH:  1981-03-17  DATE OF CONSULTATION:  08/22/2010 DATE OF DISCHARGE:  08/22/2010                                CONSULTATION   REASON FOR CONSULTATION:  Depression.  HISTORY OF PRESENT ILLNESS:  A 30 year old female who came to the [facility] ED as she took extra pills of Celexa.  The patient told me that she just took the pills to sleep because she was not able to sleep properly for the last few days, and there was no intention to kill herself.  The patient was very logical and goal directed during the interview.  Denies suicidal or homicidal ideations, was not internally preoccupied.  Told me that she  follows regularly at [facility] and has an appointment on May 1 at 2 o'clock. The patient denies any major stressors going on in her life.  The patient also denies any drug abuse.  CURRENT PSYCH MEDICATION:  The patient is on Celexa and Xanax.  MEDICAL HISTORY:  History of angina.  ALLERGIES:  Allergic to AMOXICILLIN any IMITREX.  MENTAL STATUS EXAM:  The patient was calm, cooperative during the interview, good eye contact, pleasant on approach.  Hygiene grooming fair, mood as per her okay.  Affect mood congruent.  Thought process logical and goal directed.  Thought content not suicidal or homicidal, not delusional.  Thought perception, no audiovisual hallucination reported not internally preoccupied.  Cognition alert, awake, oriented x3.  Memory immediate, recent, remote, fair.  Attention and concentration good.  Abstraction ability good.  Insight and judgment intact.  DIAGNOSIS:  AXIS I:  History of mood disorder, rule out depressive disorder. AXIS II:  Deferred. AXIS III:  No active medical issue. AXIS IV:  Unspecified at this  time. AXIS V:  50 to 60.  RECOMMENDATIONS: 1. The patient can be discharged at this time.  The patient do not     want to be admitted to the inpatient.  She has a regular followup     in the outpatient setting. 2. The patient is not acutely psychotic or manic and not any acute     depressive symptoms.  Denies suicidal     ideation, so she does not meet criteria to be admitted on IVC, but     I told the patient that she can get help from group therapy for     admission at [facility] for few days, but the patient do not     want to be  admitted at this time.  The patient can be discharged     to followup in the outpatient setting.     Eulogio Ditch, MD     SA/MEDQ  D:  08/23/2010  T:  08/23/2010  Job:  [id number]  Electronically Signed by Eulogio Ditch  on 08/23/2010 05:51:42 PM

## 2010-09-06 NOTE — H&P (Signed)
[name], [name]           ACCOUNT NO.:  [id number]   MEDICAL RECORD NO.:  [id number]         PATIENT TYPE:  WAMB   LOCATION:                                FACILITY[facility]  [facility]   PHYSICIAN:  Juan H. Lily Peer, M.D.DATE OF BIRTH:  Sep 03, 1980   DATE OF ADMISSION:  04/29/2008  DATE OF DISCHARGE:                              HISTORY & PHYSICAL   The patient is scheduled for surgery on Wednesday, April 29, 2008 at 1  p.m. at [facility].  Please have history and physical available.   CHIEF COMPLAINT:  Symptomatic leiomyomatous uterine, contributing to  dysmenorrhea and menorrhagia and anemia.   HISTORY:  The patient is a 30 year old gravida 4, para 3, AB 1, who had  been seen in the office on January 31, 2008, and then on April 08, 2008.  She had recently been referred to our office as a result of  having been seen in the emergency room at [facility].  The patient  had presented with vaginal bleeding, pelvic pressure, and cramping.  Review of her history indicated that she has had a prior tubal  sterilization procedure and had 3 children delivered vaginally.  She had  an ultrasound, which demonstrated a 12- to 14-week-sized uterus with a  posterior fibroid measuring 7.1 x 6.6 x 5.6 cm and the ovaries appeared  to be normal.  She had recently been placed on Megace to stop her  bleeding and as a result of her iron deficiency anemia was instructed to  take iron tablets 1 tablet p.o. b.i.d.  The last CBC in the office on  April 08, 2008, demonstrated a hemoglobin of 11.8, hematocrit of  37.4, and platelet count of 294,000.  Her PET scan was recently done and  which was normal and she is scheduled not to undergo a total abdominal  hysterectomy with ovarian conservation.   ALLERGIES:  The patient is allergic to [facility].   PAST CLINICAL HISTORY:  She has iron-deficiency anemia and is taking  iron b.i.d.  She has had 3 vaginal deliveries, has had a previous tubal  sterilization procedure at the time of her last delivery.  Has had a DME  in the past.   FAMILY HISTORY:  Her grandfather has history of colon cancer and mother  with history of renal failure.   PHYSICAL EXAMINATION:  GENERAL:  The patient is 5 feet 4-1/2 inches  tall, weight is 164 pounds.  Blood pressure 106/68.  HEENT:  Unremarkable.  NECK:  Supple.  Trachea midline.  No carotid bruits, no thyromegaly.  LUNGS:  Clear to auscultation, without rhonchi or wheezes.  HEART:  Regular rate and rhythm.  No murmurs or gallop.  BREAST:  Not done.  ABDOMEN:  Soft, nontender, no rebound or guarding.  PELVIC:  Bartholin, urethral, and Skene were within normal limits.  Vagina and cervix, no gross features on inspection.  Uterus  approximately 12- to 14-week size and irregular.  Adnexa difficult to  palpate.  RECTAL:  Deferred.   ASSESSMENT:  A 30 year old gravida 4, para 3, AB 1 with symptomatic  leiomyomatous uterus contributing to dysmenorrhea, menorrhagia, pelvic  pressure, bloating, and anemia.  She is scheduled to undergo a total  abdominal hysterectomy with ovarian conservation on Wednesday, April 29, 2008 at 1 p.m. at [facility].  The risks, benefits and present  cause of the operation were discussed.   To include infection, although she received prophylaxis antibiotics.  The risk of deep venous thrombosis and pulmonary embolism were  discussed.  She will have PSA stockings for prophylaxis.  Also in the  event of uncontrolled hemorrhage, and she would need blood or blood  products as a potential risk of anaphylactic reactions, hepatitis, or  acquired immunodeficiency syndrome.  Also a potential risk of trauma and  injury to internal organs requiring reparative surgery at that point.  All efforts will be undertaken to leave ovarian function by leaving both  ovaries.  All questions were answered, and we will follow accordingly.   PLAN:  The patient is scheduled for a total  abdominal hysterectomy with  ovarian conservation on Wednesday, April 29, 2008 at 1 p.m. at [facility].  Please have history and physical available.      Juan H. Lily Peer, M.D.  Electronically Signed     JHF/MEDQ  D:  04/28/2008  T:  04/29/2008  Job:  [id number]

## 2010-09-06 NOTE — Discharge Summary (Signed)
[name], [name]           ACCOUNT NO.:  [id number]   MEDICAL RECORD NO.:  [id number]          PATIENT TYPE:  INP   LOCATION:  9319                          FACILITY:  [facility]   PHYSICIAN:  Juan H. Lily Peer, M.D.DATE OF BIRTH:  March 19, 1981   DATE OF ADMISSION:  04/29/2008  DATE OF DISCHARGE:  05/01/2008                               DISCHARGE SUMMARY   HISTORY OF PRESENT ILLNESS:  The patient is a 30 year old 1 4, para 3,  AB 1 who on Wednesday, January 6th underwent a total abdominal  hysterectomy with ovarian conservation for symptomatic leiomyomatous  uterus contributing to dysmenorrhea, menorrhagia, and anemia.  The  patient is well intraoperatively, had only a blood loss of 125 mL and  receiving 1 g of cefotetan for prophylaxis.  On postoperative day #1,  her hemoglobin and hematocrit were 9.3 and 27.5, platelet count is  232,000, and urine output was greater than inclusive of 100 mL in  clearance.  She was started on clear liquid diet.  Her PCA pump was  discontinued, and she was started on oral pain medications consisting of  Percocet one p.o. q. 4-6h. p.r.n. pain.  That evening, she ambulated and  showered.  Then on postoperative day #2, she continued to be afebrile,  tolerating regular diet, ambulated, and showered; and was complaining of  being anxious with lot of anxiety and apprehension especially even  before her surgery.  We discussed about placing her on antianxiety  medications.  She was given a sample of Xanax 0.5 mg in the hospital and  was discharged home.  She will be given a prescription to take it on a  p.r.n. basis.  She looks ready to be discharged home.   FINAL DIAGNOSES:  1. Leiomyomatous uterus.  2. Dysmenorrhea.  3. Menorrhagia.  4. Anemia.  5. Anxiety.   PROCEDURE PERFORMED:  Total abdominal hysterectomy with ovarian  conservation.   FINAL DISPOSITION/FOLLOW UP:  The patient was ready to be discharged  home on second postoperative day.  She was  up and ambulating, tolerating  regular diet well and passing gas.  She was given a prescription for  Lortab 7.5 mg to take one p.o. q. 4-6h. p.r.n. pain and Reglan 10 mg one  p.o. q. 4-6h. p.r.n. nausea or vomiting.  She was instructed also to  continue to take her iron tablet one p.o. daily.  Her incision site was  intact.  She will return back in 72 hours to have staples removed.      Juan H. Lily Peer, M.D.  Electronically Signed     JHF/MEDQ  D:  05/01/2008  T:  05/02/2008  Job:  [id number]

## 2010-09-06 NOTE — Discharge Summary (Signed)
[name], [name]           ACCOUNT NO.:  [id number]   MEDICAL RECORD NO.:  [id number]          PATIENT TYPE:  INP   LOCATION:  1432                         FACILITY:  [facility]   PHYSICIAN:  Mohan N. Sharyn Lull, M.D. DATE OF BIRTH:  04/14/1981   DATE OF ADMISSION:  09/28/2008  DATE OF DISCHARGE:  09/30/2008                               DISCHARGE SUMMARY   ADMISSION DIAGNOSES:  1. Chest pain, rule out myocardial infarction.  2. Positive family history of coronary artery disease.  3. Gastroesophageal reflux disease.  4. Anxiety disorder.   DISCHARGE DIAGNOSES:  1. Status post chest pain, myocardial infarction ruled out.  2. Negative stress Myoview.  3. Gastroesophageal reflux disease.  4. Anxiety disorder, positive.  5. Family history of coronary artery disease.   DISCHARGE MEDICATIONS:  1. Enteric-coated aspirin 81 mg one tablet daily.  2. Prilosec 20 mg one capsule daily.  3. Nitrostat 0.4 mg sublingually, use as directed.  4. Xanax 0.25 mg one tablet twice daily   DIET:  Low salt, low cholesterol.   ACTIVITY:  As tolerated.   FOLLOWUP:  Follow up with me in two weeks.   CONDITION ON DISCHARGE:  Stable.   BRIEF HISTORY/HOSPITAL COURSE:  [name] is a 30 year old black  female with past medical history significant for GERD, anxiety disorder,  strong family history of coronary artery disease.  Mother died at the  age of 31 due to massive MI, status post surgical menopause.  She came  to the ER complaining of retrosternal chest pain described as tightness,  pressure, 10/10, which woke her up on Saturday night.  Again she had  similar chest pain associated with tingling and numbness in both arms.  Denies any nausea, vomiting, diaphoresis.  Denies palpitations, light-  headedness, or syncope.  Denies relation of chest pain to food,  breathing, or movement.  Denies cough, fever, or chills.  Denies  abdominal pain.  Denies urinary complaints.  No history of exertional  chest pain.   PAST MEDICAL HISTORY:  As above.   PAST SURGICAL HISTORY:  She had a hysterectomy in the past.   SOCIAL HISTORY:  She is single, three children.  Worked as Conservation officer, nature at  [employer], presently unemployed.  No smoking or alcohol abuse or drug  abuse.   FAMILY HISTORY:  Mother died of massive MI at the age of 31.  Father is  alive.  He is 109 years old.  He has alcohol abuse problems.  Two  brothers and three sisters in good health.   MEDICATIONS AT HOME:  She takes Pepcid AC on p.r.n. basis.   ALLERGIES:  No known drug allergies.   PHYSICAL EXAMINATION:  GENERAL:  She was alert, awake, oriented x3, no  acute distress.  VITAL SIGNS:  Blood pressure is 108/67,  pulse was 74 and regular.  HEENT:  Conjunctivae is pink.  NECK:  Supple.  No JVD, no bruit.  LUNGS:  Clear to auscultation without rhonchi.  CARDIOVASCULAR:  S1/S2 was normal.  There was a soft systolic murmur.  There was no S3, gallop, or rub.  ABDOMEN:  Soft.  Bowel sounds  were present.  Nontender.  EXTREMITIES:  There was no clubbing, cyanosis, or edema.   LABORATORY DATA:  EKG showed normal sinus rhythm with nonspecific T-wave  changes.   Her hemoglobin was 13, hematocrit 37.5, white count 4.6.  Potassium was  3.4, BUN 7, creatinine 0.74, glucose 98.  Two sets of cardiac enzymes  were normal.  Her C-reactive protein was 0.  Cholesterol was 132,  triglycerides 35, HDL 43, LDL 82.  Her Myoview scan showed no evidence  of reversible ischemia with EF of 68%.   HOSPITAL COURSE:  The patient was admitted to telemetry unit.  MI was  ruled out by serial enzymes and EKG.  The patient had an occasional  episode of vague chest pain during hospital stay with nonspecific T-wave  changes.  The patient subsequently underwent stress Myoview, exercised  for nine minutes on Bruce protocol.  Peak heart achieved was 171, peak  blood pressure was 101/60.  EKG showed normal sinus rhythm with  nonspecific T-wave changes and poor  R-wave progression in V1 to V3, had  an upslopiing mild ST depression in intralateral leads which reverted  towards baseline.  Occasional PVCs were noted during the stress test.  Myoview scan showed no evidence of reversible ischemia with ejection  fraction of 68%.  The patient has been ambulating in the hallway without  any exertional chest pain.  The patient will be discharged home on the  above medications and will be followed up in my office in two weeks.      Eduardo Osier. Sharyn Lull, M.D.  Electronically Signed     MNH/MEDQ  D:  09/30/2008  T:  10/01/2008  Job:  [id number]

## 2010-09-06 NOTE — Op Note (Signed)
NAMEKATHRINA, [name]           ACCOUNT NO.:  [id number]   MEDICAL RECORD NO.:  [id number]          PATIENT TYPE:  INP   LOCATION:  9319                          [facility]ACILITY:  [facility]   PHYSICIAN:  Juan H. Lily Peer, M.D.DATE OF BIRTH:  1980/06/18   DATE OF PROCEDURE:  04/29/2008  DATE OF DISCHARGE:                               OPERATIVE REPORT   SURGEON:  Juan H. Lily Peer, MD   FIRST ASSISTANT:  Timothy P. Fontaine, MD   INDICATIONS FOR OPERATION:  A 30 year old gravida 4, para 3, AB 1 with  symptomatic leiomyomatous uteri, scheduled to undergo a transabdominal  hysterectomy with ovarian conservation as a result of her leiomyomatous  uteri, dysmenorrhea, menorrhagia, and anemia.   PREOPERATIVE DIAGNOSES:  1. Symptomatic leiomyomatous uteri.  2. Anemia.  3. Dysfunctional bleeding.  4. Pelvic pain.  5. Uterine fibroids.   POSTOPERATIVE DIAGNOSES:  1. Symptomatic leiomyomatous uterine.  2. Anemia.  3. Dysfunctional bleeding.  4. Pelvic pain.  5. Uterine fibroids.   PROCEDURE PERFORMED:  Total abdominal hysterectomy.   ANESTHESIA:  General endotracheal anesthesia.   FINDINGS:  The patient with a 14 to 16-week size leiomyomatous uteri.  Normal-appearing ovaries.  Evidence of prior tubal sterilization was  evident.  The remainder of pelvic evaluation was normal.   DESCRIPTION OF OPERATION:  After the patient was adequately counseled,  she was taken to the operating room where she underwent successful  general endotracheal anesthesia.  She had PSA stockings for DVT  prophylaxis and she received a gram of cefotetan IV for infection  prophylaxis.  After general endotracheal anesthesia was obtained, the  patient's abdomen, vagina, and perineum were prepped and draped in usual  sterile fashion.  A Foley catheter was placed for monitorization of  urine output.  After the drapes were in place, a Pfannenstiel skin  incision was made 2 cm above the symphysis pubis.  The incision  was  carried out from skin, subcutaneous tissue down to the rectus fascia.  A  midline nick was made.  The fascia was incised in transverse fashion.  The peritoneal cavity was entered cautiously.  The O'Connor-O'Sullivan  retractors were placed.  The patient was placed in Trendelenburg  position.  Systematic inspection demonstrated 14-16 weeks size of  leiomyomatous uteri.  Normal-appearing ovaries bilaterally.  Previous a  evidence of tubal transection from sterilization was noted.  The right  round ligament was placed under tension and a transfixion stitch of 0  Vicryl suture was placed and the right round ligament that had been  transected and the bladder flap was established.  The utero-ovarian  ligament was clamped.  After it was transected, that was secured with 0  Vicryl suture in a transfixion stitch followed by free tie of 0 Vicryl  suture.  The remainder of the broad and cardinal ligaments were clamped,  cut, and suture ligated to the level of the right vaginal fornices and  similar procedure was carried out on the contralateral side.  After the  bladder flap had been established, an incision was made in the anterior  portion of the cervix, and with the use of Jorgenson scissors  the cervix  was removed from the vagina and passed off the operative field.  Both  angles were secured with a transfixion stitch of 0 Vicryl suture and the  rest of the vaginal cuff was closed with interrupted sutures of 0 Vicryl  suture.  The pelvic cavity was copiously irrigated with normal saline  solution and systematic inspection demonstrated both pedicles to be dry  as well as the vaginal cuff.  Sponge and needle count were correct.  The  visceral peritoneum was not reapproximated, but the rectus fascia was  closed with running stitch of 0 Vicryl suture and the subcutaneous  bleeders were Bovie cauterized.  The skin was reapproximated the skin  clips followed by placing Xeroform gauze and followed  by dressing.  The  patient was extubated, transferred to recovery in stable vital signs.  Blood loss was 125 mL.  IV fluids 1600 mL of lactated Ringer.  Urine  output 125 mL of clear.      Juan H. Lily Peer, M.D.  Electronically Signed     JHF/MEDQ  D:  04/29/2008  T:  04/30/2008  Job:  [id number]

## 2010-09-09 NOTE — Discharge Summary (Signed)
NAME:  [name], [name]                     ACCOUNT NO.:  [id number]   MEDICAL RECORD NO.:  [id number]                   PATIENT TYPE:  INP   LOCATION:  9124                                 FACILITY:  [facility]   PHYSICIAN:  Kathreen Cosier, M.D.           DATE OF BIRTH:  July 16, 1980   DATE OF ADMISSION:  12/14/2003  DATE OF DISCHARGE:                                 DISCHARGE SUMMARY   HOSPITAL COURSE:  The patient is a 30 year old gravida 4 para 2-0-1-2 with  [facility] December 13, 2003 who requested induction.  She was admitted with the  cervix 4 cm, 60%, vertex -2 to -3.  On admission her hemoglobin was 10.2 and  postoperative 9.6, platelets 303 and 266, RPR negative.  The patient had a  normal vaginal delivery of a female, Apgars 8 and 9, and desired  sterilization.  She underwent sterilization on December 15, 2003 and she did  well.  She was discharged on postpartum day #2 ambulatory, on a regular  diet, hemoglobin 9.6, to see me in 6 weeks.   DISCHARGE DIAGNOSES:  1. Status post normal vaginal delivery at term.  2. Postpartum tubal ligation.                                               Kathreen Cosier, M.D.    BAM/MEDQ  D:  12/16/2003  T:  12/16/2003  Job:  [id number]

## 2010-09-09 NOTE — Discharge Summary (Signed)
[name], [name]           ACCOUNT NO.:  [id number]   MEDICAL RECORD NO.:  [id number]          PATIENT TYPE:  OBV   LOCATION:  1432                         [facility]ACILITY:  [facility]   PHYSICIAN:  Mohan N. Sharyn Lull, M.D. DATE OF BIRTH:  Jan 18, 1981   DATE OF ADMISSION:  09/28/2008  DATE OF DISCHARGE:  09/30/2008                               DISCHARGE SUMMARY   ADMITTING DIAGNOSES:  1. Chest pain, rule out myocardial infarction.  Positive family      history of coronary artery disease.  2. Gastroesophageal reflux disease.  3. Anxiety disorder.   DISCHARGE DIAGNOSES:  1. Status post recurrent chest pain, myocardial infarction ruled out.      Negative Persantine Myoview.  Positive family history of coronary      artery disease.  2. Gastroesophageal reflux disease.  3. Anxiety disorder.   DISCHARGE HOME MEDICATIONS:  1. Prilosec 20 mg one capsule daily.  2. Enteric-coated aspirin 81 mg one tablet daily.  3. Nitrostat 0.4 mg sublingual use as directed.  4. Xanax 0.25 mg one tablet twice daily.   DIET:  Low salt, low cholesterol.   ACTIVITY:  As tolerated.   FOLLOWUP:  With me in 2 weeks.   CONDITION AT DISCHARGE:  Stable.   BRIEF HISTORY AND HOSPITAL COURSE:  [name] is a 30 year old black  female with past medical history significant for GERD, anxiety disorder,  strong family history of coronary artery disease.  Mother died of MI at  the age of 21.  Status post surgical menopause.  Came to the ER  complaining of retrosternal chest pain described as tightness, pressure  grade 10/10 which woke her up on Saturday night.  Again had similar  chest pain associated with tingling and numbness in both arms.  Denies  any nausea, vomiting, diaphoresis.  Denies palpitations, lightheadedness  or syncope.  Denies relation of chest pain to food, breathing or  movement.  Denies any cough, fever or chills.  Denies abdominal pain.  Denies urinary complaints.  Denies history of exertional  chest pain.   PAST MEDICAL HISTORY:  As above.   PAST SURGICAL HISTORY:  She had hysterectomy in the past.   She is single, has three children.  Worked as a Conservation officer, nature in [employer],  presently unemployed.  No history of smoking or alcohol abuse or drug  abuse.   FAMILY HISTORY:  Mother died of massive MI at the age of 52.  Father is  alive, he is 74, alcohol abuse.  Two brothers and three sisters in good  health.   MEDICATION AT HOME:  She takes Pepcid AC p.r.n.   ALLERGIES:  NO KNOWN DRUG ALLERGIES.   EXAMINATION:  She was alert, awake and oriented x3; in no acute  distress.  Blood pressure was 108/67, pulse was 74, afebrile.  Conjunctivae was pink.  NECK:  Supple, no JVD, no bruit.  LUNGS:  Clear to auscultation without rhonchi or rales.  CARDIOVASCULAR:  S1 - S2 was normal, there was soft systolic murmur,  there was no S3 gallop or rub.  ABDOMEN:  Soft, bowel sounds were present, nontender.  EXTREMITIES:  There  is no clubbing, cyanosis or edema.   LABS:  EKG showed normal sinus rhythm with nonspecific T-wave changes.  Two sets of cardiac enzymes were negative.  C-reactive protein was  normal.  Cholesterol was 132, HDL 35, LDL 82.  Hemoglobin was 13,  hematocrit 37.5, white count of 4.6, sodium was 138, potassium 3.4,  chloride 106, bicarb 24, glucose 98, BUN 7, creatinine 0.74.   BRIEF HOSPITAL COURSE:  The patient was admitted to telemetry unit.  MI  was ruled out by serial enzymes and EKG.  The patient had occasional  episode of vague chest pain during the hospital stay with nonspecific T-  wave changes.  The patient subsequently underwent stress Myoview,  exercised for 9 minutes on Bruce protocol, peak heart rate __________  was 171, peak blood pressure was 101/60.  EKG showed normal sinus rhythm  with nonspecific T-wave changes and poor R-wave progression in V1  through V3 and had upsloping mild ST depression in inferolateral leads  which reverted towards baseline.   Occasional PVCs were noted during the  stress test.  Myoview scan showed no evidence of reversible ischemia  with EF of 68%.  The patient has been ambulating in hallway without any  exertional chest pain.  The patient will be discharged home on above  medications and will be followed up in my office in 2 weeks.      Eduardo Osier. Sharyn Lull, M.D.  Electronically Signed     MNH/MEDQ  D:  11/06/2008  T:  11/07/2008  Job:  [id number]

## 2010-09-09 NOTE — Op Note (Signed)
NAME:  [name], [name]                     ACCOUNT NO.:  [id number]   MEDICAL RECORD NO.:  [id number]                   PATIENT TYPE:  INP   LOCATION:  9124                                 FACILITY:  [facility]   PHYSICIAN:  Kathreen Cosier, M.D.           DATE OF BIRTH:  [birth year]   DATE OF PROCEDURE:  12/15/2003  DATE OF DISCHARGE:                                 OPERATIVE REPORT   PREOPERATIVE DIAGNOSES:  Multiparity desires postpartum sterilization.   POSTOPERATIVE DIAGNOSES:  Multiparity desires postpartum sterilization.    Using epidural, patient in the supine position, abdomen prepped and draped,  bladder emptied with a straight catheter. A midline subumbilical incision 1  inch long was made, carried down to the fascia, fascia cleaned, grasped with  two Kocher's and then fascia and the peritoneum opened with the Mayo  scissors left tuberosity to the mid portion with a Babcock clamp. The tube  was traced to the fimbria. A zero plain suture was placed in the mesosalpinx  below the portion of the tube within the clamp. This was tied and then  approximately __________ tube transected. Hemostasis was satisfactory.  Procedure done in a similar fashion on the other side.  Lap and sponge  counts correct.  Abdomen closed in layers, peritoneum and fascia continuous  suture of #0 Dexon and the subcu closed with interrupted 3-0 plain and the  skin closed with subcuticular stitch of 4-0 Monocryl done __________ 5 mL.  The patient tolerated the procedure well and was taken to the recovery room  in good condition.                                               Kathreen Cosier, M.D.    BAM/MEDQ  D:  12/15/2003  T:  12/15/2003  Job:  [id number]

## 2010-09-23 ENCOUNTER — Inpatient Hospital Stay (HOSPITAL_COMMUNITY)
Admission: RE | Admit: 2010-09-23 | Discharge: 2010-09-27 | DRG: 885 | Disposition: A | Discharge status: Home / Self Care | Payer: Medicaid Other | Source: Ambulatory Visit | Attending: Psychiatry | Admitting: Psychiatry

## 2010-09-23 DIAGNOSIS — F329 Major depressive disorder, single episode, unspecified: Principal | ICD-10-CM

## 2010-09-23 DIAGNOSIS — K219 Gastro-esophageal reflux disease without esophagitis: Secondary | ICD-10-CM

## 2010-09-23 DIAGNOSIS — E559 Vitamin D deficiency, unspecified: Secondary | ICD-10-CM

## 2010-09-23 DIAGNOSIS — J45909 Unspecified asthma, uncomplicated: Secondary | ICD-10-CM

## 2010-09-23 DIAGNOSIS — D509 Iron deficiency anemia, unspecified: Secondary | ICD-10-CM

## 2010-09-23 DIAGNOSIS — Z6379 Other stressful life events affecting family and household: Secondary | ICD-10-CM

## 2010-09-23 DIAGNOSIS — R45851 Suicidal ideations: Secondary | ICD-10-CM

## 2010-09-23 DIAGNOSIS — F603 Borderline personality disorder: Secondary | ICD-10-CM

## 2010-09-24 ENCOUNTER — Other Ambulatory Visit: Payer: Self-pay

## 2010-09-24 DIAGNOSIS — F329 Major depressive disorder, single episode, unspecified: Secondary | ICD-10-CM

## 2010-09-24 LAB — CBC
HCT: 35.9 % — ABNORMAL LOW (ref 36.0–46.0)
Hemoglobin: 11.9 g/dL — ABNORMAL LOW (ref 12.0–15.0)
MCH: 29.1 pg (ref 26.0–34.0)
MCHC: 33.1 g/dL (ref 30.0–36.0)
MCV: 87.8 fL (ref 78.0–100.0)
Platelets: 230 10*3/uL (ref 150–400)
RBC: 4.09 MIL/uL (ref 3.87–5.11)
RDW: 12.5 % (ref 11.5–15.5)
WBC: 4.4 10*3/uL (ref 4.0–10.5)

## 2010-09-24 LAB — COMPREHENSIVE METABOLIC PANEL
ALT: 7 U/L (ref 0–35)
AST: 15 U/L (ref 0–37)
Albumin: 3.7 g/dL (ref 3.5–5.2)
Alkaline Phosphatase: 64 U/L (ref 39–117)
BUN: 10 mg/dL (ref 6–23)
CO2: 26 mEq/L (ref 19–32)
Calcium: 9 mg/dL (ref 8.4–10.5)
Chloride: 104 mEq/L (ref 96–112)
Creatinine, Ser: 0.72 mg/dL (ref 0.4–1.2)
GFR calc Af Amer: >60 mL/min (ref >60)
GFR calc non Af Amer: >60 mL/min (ref >60)
Glucose, Bld: 100 mg/dL — ABNORMAL HIGH (ref 70–99)
Potassium: 3.5 mEq/L (ref 3.5–5.1)
Sodium: 138 mEq/L (ref 135–145)
Total Bilirubin: 0.2 mg/dL — ABNORMAL LOW (ref 0.3–1.2)
Total Protein: 7.3 g/dL (ref 6.0–8.3)

## 2010-09-25 LAB — CARDIAC PANEL(CRET KIN+CKTOT+MB+TROPI)
CK, MB: 1.4 ng/mL (ref 0.3–4.0)
Relative Index: INVALID (ref 0.0–2.5)
Total CK: 96 U/L (ref 7–177)
Troponin I: <0.3 ng/mL (ref <0.30)

## 2010-09-26 LAB — HCG, SERUM, QUALITATIVE: Preg, Serum: NEGATIVE

## 2010-09-26 LAB — CARDIAC PANEL(CRET KIN+CKTOT+MB+TROPI)
CK, MB: 1.2 ng/mL (ref 0.3–4.0)
Relative Index: INVALID (ref 0.0–2.5)
Total CK: 80 U/L (ref 7–177)
Troponin I: <0.3 ng/mL (ref <0.30)

## 2010-09-26 LAB — PREGNANCY, URINE: Preg Test, Ur: NEGATIVE

## 2010-09-27 LAB — TSH: TSH: 2.207 u[IU]/mL (ref 0.350–4.500)

## 2010-09-27 LAB — T4, FREE: Free T4: 1.18 ng/dL (ref 0.80–1.80)

## 2010-09-27 LAB — T3, FREE: T3, Free: 3.1 pg/mL (ref 2.3–4.2)
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
## 2010-10-24 ENCOUNTER — Emergency Department (HOSPITAL_COMMUNITY)
Admission: EM | Admit: 2010-10-24 | Discharge: 2010-10-24 | Disposition: A | Discharge status: Home / Self Care | Payer: Medicaid Other | Attending: Emergency Medicine | Admitting: Emergency Medicine

## 2010-10-24 ENCOUNTER — Emergency Department (HOSPITAL_COMMUNITY): Payer: Medicaid Other

## 2010-10-24 DIAGNOSIS — I498 Other specified cardiac arrhythmias: Secondary | ICD-10-CM | POA: Insufficient documentation

## 2010-10-24 DIAGNOSIS — R0789 Other chest pain: Secondary | ICD-10-CM | POA: Insufficient documentation

## 2010-10-24 DIAGNOSIS — F410 Panic disorder [episodic paroxysmal anxiety] without agoraphobia: Secondary | ICD-10-CM | POA: Insufficient documentation

## 2010-10-24 LAB — URINALYSIS, ROUTINE W REFLEX MICROSCOPIC
Bilirubin Urine: NEGATIVE
Glucose, UA: NEGATIVE mg/dL
Hgb urine dipstick: NEGATIVE
Ketones, ur: NEGATIVE mg/dL
Leukocytes, UA: NEGATIVE
Nitrite: NEGATIVE
Protein, ur: NEGATIVE mg/dL
Specific Gravity, Urine: 1.026 (ref 1.005–1.030)
Urobilinogen, UA: 0.2 mg/dL (ref 0.0–1.0)
pH: 6 (ref 5.0–8.0)

## 2010-10-24 LAB — DIFFERENTIAL
Basophils Absolute: 0 10*3/uL (ref 0.0–0.1)
Basophils Relative: 1 % (ref 0–1)
Eosinophils Absolute: 0.4 10*3/uL (ref 0.0–0.7)
Eosinophils Relative: 7 % — ABNORMAL HIGH (ref 0–5)
Lymphocytes Relative: 43 % (ref 12–46)
Lymphs Abs: 2.4 10*3/uL (ref 0.7–4.0)
Monocytes Absolute: 0.3 10*3/uL (ref 0.1–1.0)
Monocytes Relative: 5 % (ref 3–12)
Neutro Abs: 2.5 10*3/uL (ref 1.7–7.7)
Neutrophils Relative %: 45 % (ref 43–77)

## 2010-10-24 LAB — BASIC METABOLIC PANEL
BUN: 13 mg/dL (ref 6–23)
CO2: 26 mEq/L (ref 19–32)
Calcium: 9.4 mg/dL (ref 8.4–10.5)
Chloride: 101 mEq/L (ref 96–112)
Creatinine, Ser: 0.77 mg/dL (ref 0.50–1.10)
GFR calc Af Amer: >60 mL/min (ref >60)
GFR calc non Af Amer: >60 mL/min (ref >60)
Glucose, Bld: 103 mg/dL — ABNORMAL HIGH (ref 70–99)
Potassium: 3.9 mEq/L (ref 3.5–5.1)
Sodium: 135 mEq/L (ref 135–145)

## 2010-10-24 LAB — TROPONIN I: Troponin I: <0.3 ng/mL (ref <0.30)

## 2010-10-24 LAB — CK TOTAL AND CKMB (NOT AT ARMC)
CK, MB: 1.4 ng/mL (ref 0.3–4.0)
Relative Index: INVALID (ref 0.0–2.5)
Total CK: 81 U/L (ref 7–177)

## 2010-10-24 LAB — CBC
HCT: 37.2 % (ref 36.0–46.0)
Hemoglobin: 12.5 g/dL (ref 12.0–15.0)
MCH: 29.2 pg (ref 26.0–34.0)
MCHC: 33.6 g/dL (ref 30.0–36.0)
MCV: 86.9 fL (ref 78.0–100.0)
Platelets: 222 10*3/uL (ref 150–400)
RBC: 4.28 MIL/uL (ref 3.87–5.11)
RDW: 12.3 % (ref 11.5–15.5)
WBC: 5.6 10*3/uL (ref 4.0–10.5)

## 2010-10-24 LAB — POCT PREGNANCY, URINE: Preg Test, Ur: NEGATIVE

## 2010-10-27 ENCOUNTER — Emergency Department (HOSPITAL_COMMUNITY)
Admission: EM | Admit: 2010-10-27 | Discharge: 2010-10-27 | Disposition: A | Discharge status: Home / Self Care | Payer: Medicaid Other | Attending: Emergency Medicine | Admitting: Emergency Medicine

## 2010-10-27 DIAGNOSIS — F411 Generalized anxiety disorder: Secondary | ICD-10-CM | POA: Insufficient documentation

## 2010-10-27 DIAGNOSIS — X789XXA Intentional self-harm by unspecified sharp object, initial encounter: Secondary | ICD-10-CM | POA: Insufficient documentation

## 2010-10-27 DIAGNOSIS — F3289 Other specified depressive episodes: Secondary | ICD-10-CM | POA: Insufficient documentation

## 2010-10-27 DIAGNOSIS — IMO0002 Reserved for concepts with insufficient information to code with codable children: Secondary | ICD-10-CM | POA: Insufficient documentation

## 2010-10-27 DIAGNOSIS — Z23 Encounter for immunization: Secondary | ICD-10-CM | POA: Insufficient documentation

## 2010-10-27 DIAGNOSIS — F329 Major depressive disorder, single episode, unspecified: Secondary | ICD-10-CM | POA: Insufficient documentation

## 2010-10-27 LAB — ETHANOL: Alcohol, Ethyl (B): <11 mg/dL (ref 0–11)

## 2010-10-27 LAB — DIFFERENTIAL
Basophils Absolute: 0.1 10*3/uL (ref 0.0–0.1)
Basophils Relative: 1 % (ref 0–1)
Eosinophils Absolute: 0.3 10*3/uL (ref 0.0–0.7)
Eosinophils Relative: 6 % — ABNORMAL HIGH (ref 0–5)
Lymphocytes Relative: 45 % (ref 12–46)
Lymphs Abs: 2.1 10*3/uL (ref 0.7–4.0)
Monocytes Absolute: 0.3 10*3/uL (ref 0.1–1.0)
Monocytes Relative: 6 % (ref 3–12)
Neutro Abs: 2 10*3/uL (ref 1.7–7.7)
Neutrophils Relative %: 42 % — ABNORMAL LOW (ref 43–77)

## 2010-10-27 LAB — RAPID URINE DRUG SCREEN, HOSP PERFORMED
Amphetamines: NOT DETECTED
Barbiturates: NOT DETECTED
Benzodiazepines: NOT DETECTED
Cocaine: NOT DETECTED
Opiates: NOT DETECTED
Tetrahydrocannabinol: NOT DETECTED

## 2010-10-27 LAB — CBC
HCT: 37.9 % (ref 36.0–46.0)
Hemoglobin: 12.8 g/dL (ref 12.0–15.0)
MCH: 29.5 pg (ref 26.0–34.0)
MCHC: 33.8 g/dL (ref 30.0–36.0)
MCV: 87.3 fL (ref 78.0–100.0)
Platelets: 226 10*3/uL (ref 150–400)
RBC: 4.34 MIL/uL (ref 3.87–5.11)
RDW: 12.1 % (ref 11.5–15.5)
WBC: 4.8 10*3/uL (ref 4.0–10.5)

## 2010-10-27 LAB — COMPREHENSIVE METABOLIC PANEL
ALT: 7 U/L (ref 0–35)
AST: 17 U/L (ref 0–37)
Albumin: 4.1 g/dL (ref 3.5–5.2)
Alkaline Phosphatase: 76 U/L (ref 39–117)
BUN: 9 mg/dL (ref 6–23)
CO2: 27 mEq/L (ref 19–32)
Calcium: 9.2 mg/dL (ref 8.4–10.5)
Chloride: 103 mEq/L (ref 96–112)
Creatinine, Ser: 0.68 mg/dL (ref 0.50–1.10)
GFR calc Af Amer: >60 mL/min (ref >60)
GFR calc non Af Amer: >60 mL/min (ref >60)
Glucose, Bld: 92 mg/dL (ref 70–99)
Potassium: 3.5 mEq/L (ref 3.5–5.1)
Sodium: 137 mEq/L (ref 135–145)
Total Bilirubin: 0.4 mg/dL (ref 0.3–1.2)
Total Protein: 7.9 g/dL (ref 6.0–8.3)

## 2010-10-27 LAB — POCT PREGNANCY, URINE: Preg Test, Ur: NEGATIVE

## 2010-11-30 ENCOUNTER — Emergency Department (HOSPITAL_COMMUNITY)
Admission: EM | Admit: 2010-11-30 | Discharge: 2010-12-01 | Disposition: A | Discharge status: Home / Self Care | Payer: Medicaid Other | Attending: Emergency Medicine | Admitting: Emergency Medicine

## 2010-11-30 DIAGNOSIS — Z0389 Encounter for observation for other suspected diseases and conditions ruled out: Secondary | ICD-10-CM | POA: Insufficient documentation

## 2010-11-30 DIAGNOSIS — F341 Dysthymic disorder: Secondary | ICD-10-CM | POA: Insufficient documentation

## 2010-11-30 DIAGNOSIS — I498 Other specified cardiac arrhythmias: Secondary | ICD-10-CM | POA: Insufficient documentation

## 2010-11-30 LAB — CBC
HCT: 36.5 % (ref 36.0–46.0)
Hemoglobin: 12.1 g/dL (ref 12.0–15.0)
MCH: 28.9 pg (ref 26.0–34.0)
MCHC: 33.2 g/dL (ref 30.0–36.0)
MCV: 87.1 fL (ref 78.0–100.0)
Platelets: 255 10*3/uL (ref 150–400)
RBC: 4.19 MIL/uL (ref 3.87–5.11)
RDW: 12.3 % (ref 11.5–15.5)
WBC: 4.5 10*3/uL (ref 4.0–10.5)

## 2010-11-30 LAB — COMPREHENSIVE METABOLIC PANEL
ALT: 8 U/L (ref 0–35)
AST: 17 U/L (ref 0–37)
Albumin: 3.6 g/dL (ref 3.5–5.2)
Alkaline Phosphatase: 64 U/L (ref 39–117)
BUN: 10 mg/dL (ref 6–23)
CO2: 24 mEq/L (ref 19–32)
Calcium: 9.3 mg/dL (ref 8.4–10.5)
Chloride: 103 mEq/L (ref 96–112)
Creatinine, Ser: 0.65 mg/dL (ref 0.50–1.10)
GFR calc Af Amer: >60 mL/min (ref >60)
GFR calc non Af Amer: >60 mL/min (ref >60)
Glucose, Bld: 73 mg/dL (ref 70–99)
Potassium: 3.4 mEq/L — ABNORMAL LOW (ref 3.5–5.1)
Sodium: 135 mEq/L (ref 135–145)
Total Bilirubin: 0.2 mg/dL — ABNORMAL LOW (ref 0.3–1.2)
Total Protein: 7.5 g/dL (ref 6.0–8.3)

## 2010-11-30 LAB — DIFFERENTIAL
Basophils Absolute: 0 10*3/uL (ref 0.0–0.1)
Basophils Relative: 1 % (ref 0–1)
Eosinophils Absolute: 0.4 10*3/uL (ref 0.0–0.7)
Eosinophils Relative: 8 % — ABNORMAL HIGH (ref 0–5)
Lymphocytes Relative: 51 % — ABNORMAL HIGH (ref 12–46)
Lymphs Abs: 2.3 10*3/uL (ref 0.7–4.0)
Monocytes Absolute: 0.4 10*3/uL (ref 0.1–1.0)
Monocytes Relative: 8 % (ref 3–12)
Neutro Abs: 1.4 10*3/uL — ABNORMAL LOW (ref 1.7–7.7)
Neutrophils Relative %: 32 % — ABNORMAL LOW (ref 43–77)

## 2010-11-30 LAB — RAPID URINE DRUG SCREEN, HOSP PERFORMED
Amphetamines: NOT DETECTED
Barbiturates: NOT DETECTED
Benzodiazepines: NOT DETECTED
Cocaine: NOT DETECTED
Opiates: NOT DETECTED
Tetrahydrocannabinol: NOT DETECTED

## 2010-11-30 LAB — ACETAMINOPHEN LEVEL: Acetaminophen (Tylenol), Serum: <15 ug/mL (ref 10–30)

## 2010-11-30 LAB — ETHANOL: Alcohol, Ethyl (B): <11 mg/dL (ref 0–11)

## 2010-11-30 LAB — SALICYLATE LEVEL: Salicylate Lvl: <2 mg/dL — ABNORMAL LOW (ref 2.8–20.0)

## 2010-12-18 ENCOUNTER — Emergency Department (HOSPITAL_COMMUNITY)
Admission: EM | Admit: 2010-12-18 | Discharge: 2010-12-19 | Disposition: A | Discharge status: Other Acute Inpatient Hospital | Payer: Medicaid Other | Source: Home / Self Care | Attending: Emergency Medicine | Admitting: Emergency Medicine

## 2010-12-18 DIAGNOSIS — I498 Other specified cardiac arrhythmias: Secondary | ICD-10-CM | POA: Insufficient documentation

## 2010-12-18 DIAGNOSIS — T50904A Poisoning by unspecified drugs, medicaments and biological substances, undetermined, initial encounter: Secondary | ICD-10-CM | POA: Insufficient documentation

## 2010-12-18 DIAGNOSIS — F329 Major depressive disorder, single episode, unspecified: Secondary | ICD-10-CM | POA: Insufficient documentation

## 2010-12-18 DIAGNOSIS — T50901A Poisoning by unspecified drugs, medicaments and biological substances, accidental (unintentional), initial encounter: Secondary | ICD-10-CM | POA: Insufficient documentation

## 2010-12-18 DIAGNOSIS — F3289 Other specified depressive episodes: Secondary | ICD-10-CM | POA: Insufficient documentation

## 2010-12-19 ENCOUNTER — Inpatient Hospital Stay (HOSPITAL_COMMUNITY)
Admission: RE | Admit: 2010-12-19 | Discharge: 2010-12-23 | DRG: 885 | Disposition: A | Discharge status: Home / Self Care | Payer: Medicaid Other | Attending: Psychiatry | Admitting: Psychiatry

## 2010-12-19 DIAGNOSIS — J45909 Unspecified asthma, uncomplicated: Secondary | ICD-10-CM

## 2010-12-19 DIAGNOSIS — T426X1A Poisoning by other antiepileptic and sedative-hypnotic drugs, accidental (unintentional), initial encounter: Secondary | ICD-10-CM

## 2010-12-19 DIAGNOSIS — T426X2A Poisoning by other antiepileptic and sedative-hypnotic drugs, intentional self-harm, initial encounter: Secondary | ICD-10-CM

## 2010-12-19 DIAGNOSIS — Z79899 Other long term (current) drug therapy: Secondary | ICD-10-CM

## 2010-12-19 DIAGNOSIS — F603 Borderline personality disorder: Secondary | ICD-10-CM

## 2010-12-19 DIAGNOSIS — T4272XA Poisoning by unspecified antiepileptic and sedative-hypnotic drugs, intentional self-harm, initial encounter: Secondary | ICD-10-CM

## 2010-12-19 DIAGNOSIS — Z6379 Other stressful life events affecting family and household: Secondary | ICD-10-CM

## 2010-12-19 DIAGNOSIS — T424X4A Poisoning by benzodiazepines, undetermined, initial encounter: Secondary | ICD-10-CM

## 2010-12-19 DIAGNOSIS — T43502A Poisoning by unspecified antipsychotics and neuroleptics, intentional self-harm, initial encounter: Secondary | ICD-10-CM

## 2010-12-19 DIAGNOSIS — K219 Gastro-esophageal reflux disease without esophagitis: Secondary | ICD-10-CM

## 2010-12-19 DIAGNOSIS — F339 Major depressive disorder, recurrent, unspecified: Principal | ICD-10-CM

## 2010-12-19 LAB — CBC
HCT: 34.7 % — ABNORMAL LOW (ref 36.0–46.0)
Hemoglobin: 11.8 g/dL — ABNORMAL LOW (ref 12.0–15.0)
MCH: 29.6 pg (ref 26.0–34.0)
MCHC: 34 g/dL (ref 30.0–36.0)
MCV: 87.2 fL (ref 78.0–100.0)
Platelets: 206 K/uL (ref 150–400)
RBC: 3.98 MIL/uL (ref 3.87–5.11)
RDW: 12.5 % (ref 11.5–15.5)
WBC: 5.3 K/uL (ref 4.0–10.5)

## 2010-12-19 LAB — URINALYSIS, ROUTINE W REFLEX MICROSCOPIC
Bilirubin Urine: NEGATIVE
Glucose, UA: NEGATIVE mg/dL
Hgb urine dipstick: NEGATIVE
Ketones, ur: NEGATIVE mg/dL
Leukocytes, UA: NEGATIVE
Nitrite: NEGATIVE
Protein, ur: NEGATIVE mg/dL
Specific Gravity, Urine: 1.004 — ABNORMAL LOW (ref 1.005–1.030)
Urobilinogen, UA: 0.2 mg/dL (ref 0.0–1.0)
pH: 6 (ref 5.0–8.0)

## 2010-12-19 LAB — COMPREHENSIVE METABOLIC PANEL
ALT: 11 U/L (ref 0–35)
AST: 17 U/L (ref 0–37)
Albumin: 3.5 g/dL (ref 3.5–5.2)
Alkaline Phosphatase: 92 U/L (ref 39–117)
BUN: 8 mg/dL (ref 6–23)
CO2: 29 mEq/L (ref 19–32)
Calcium: 9.1 mg/dL (ref 8.4–10.5)
Chloride: 102 mEq/L (ref 96–112)
Creatinine, Ser: 0.66 mg/dL (ref 0.50–1.10)
GFR calc Af Amer: >60 mL/min (ref >60)
GFR calc non Af Amer: >60 mL/min (ref >60)
Glucose, Bld: 103 mg/dL — ABNORMAL HIGH (ref 70–99)
Potassium: 3.4 mEq/L — ABNORMAL LOW (ref 3.5–5.1)
Sodium: 136 mEq/L (ref 135–145)
Total Bilirubin: 0.1 mg/dL — ABNORMAL LOW (ref 0.3–1.2)
Total Protein: 7.2 g/dL (ref 6.0–8.3)

## 2010-12-19 LAB — RAPID URINE DRUG SCREEN, HOSP PERFORMED
Amphetamines: NOT DETECTED
Barbiturates: NOT DETECTED
Benzodiazepines: NOT DETECTED
Cocaine: NOT DETECTED
Opiates: NOT DETECTED
Tetrahydrocannabinol: NOT DETECTED

## 2010-12-19 LAB — DIFFERENTIAL
Basophils Absolute: 0.1 K/uL (ref 0.0–0.1)
Basophils Relative: 1 % (ref 0–1)
Eosinophils Absolute: 0.4 K/uL (ref 0.0–0.7)
Eosinophils Relative: 8 % — ABNORMAL HIGH (ref 0–5)
Lymphocytes Relative: 37 % (ref 12–46)
Lymphs Abs: 1.9 K/uL (ref 0.7–4.0)
Monocytes Absolute: 0.3 K/uL (ref 0.1–1.0)
Monocytes Relative: 6 % (ref 3–12)
Neutro Abs: 2.6 K/uL (ref 1.7–7.7)
Neutrophils Relative %: 49 % (ref 43–77)

## 2010-12-19 LAB — ACETAMINOPHEN LEVEL: Acetaminophen (Tylenol), Serum: <15 ug/mL (ref 10–30)

## 2010-12-19 LAB — ETHANOL: Alcohol, Ethyl (B): <11 mg/dL (ref 0–11)

## 2010-12-19 LAB — SALICYLATE LEVEL: Salicylate Lvl: <2 mg/dL — ABNORMAL LOW (ref 2.8–20.0)

## 2010-12-19 LAB — PREGNANCY, URINE: Preg Test, Ur: NEGATIVE

## 2010-12-20 DIAGNOSIS — F339 Major depressive disorder, recurrent, unspecified: Secondary | ICD-10-CM

## 2010-12-20 DIAGNOSIS — F603 Borderline personality disorder: Secondary | ICD-10-CM

## 2010-12-21 NOTE — Assessment & Plan Note (Signed)
  [name], Brown           ACCOUNT NO.:  [id number]  MEDICAL RECORD NO.:  [id number]  LOCATION:  0500                          FACILITY:  [facility]  PHYSICIAN:  Evelyn Gallo, MD     DATE OF BIRTH:  1981/01/26  DATE OF ADMISSION:  12/19/2010 DATE OF DISCHARGE:                      PSYCHIATRIC ADMISSION ASSESSMENT   IDENTIFYING INFORMATION:  The patient is a 30 year old female voluntarily admitted on December 19, 2010.  REASON FOR ADMISSION:  The patient presented to the emergency department with a complaint of an overdose.  She has been feeling more depressed at home and having suicidal thoughts on her medications.  She overdosed on 5 Ambien 10 mg dosing and 5 Xanax.  She was reporting depressive symptoms but denied that she wanted to kill herself.  She denied any psychotic symptoms.  She denies any history of any substance use.  PAST PSYCHIATRIC HISTORY:  The patient has an ACT team top priority. This is her second admission.  She was in our facility in June 2012 for problems with anxiety and depression.  SOCIAL HISTORY:  The patient is married and has 2 younger children, an 58- year-old and a 83-year-old.  She was working as a Conservation officer, nature at [employer].  FAMILY HISTORY:  A father with a history of alcohol abuse.  ALCOHOL AND DRUG HISTORY:  The patient denies any substance use.  PRIMARY CARE PROVIDER:  Dr. Fleet Contras.  MEDICAL PROBLEMS:  History of GERD and asthma.  MEDICATIONS:  The patient has been on: 1. Celexa 20 mg, 1 b.i.d. 2. Iron over the counter. 3. Klonopin b.i.d. for anxiety. 4. Nitroglycerin. 5. Omeprazole 20 mg b.i.d. 6. Ambien for sleep.  PHYSICAL EXAMINATION:  Physical exam was performed at [facility] Emergency Department.  Physical exam was reviewed.  No significant findings.  She, however, did have a heart rate of 48 on her EKG.  Poison Control was notified.  Her CBC shows a hemoglobin of 11.8, hematocrit of 34.7.  Urinalysis was negative.   Urine pregnancy test negative.  Her alcohol level was less than 11.  Salicylate level less than 2.  MENTAL STATUS EXAM:  She is fully alert and cooperative.  She was seen in the treatment team for concerns and further questions.  She was bright in appearance and open about trying a new medication.  DIAGNOSES:   Axis I:  Major depressive disorder, recurrent. Axis II:  Borderline personality. Axis III:  History of asthma and GERD. Axis IV:  Chronic mental illness. Axis V:  Current GAF is 30.  PLAN:  Our plan was to discontinue her prior medications and place her on Effexor XR 75 mg daily and Trileptal 300 mg b.i.d.  Her ACT team was to visit during her hospitalization.  Her tentative length of stay is 2- 3 days.     [name], N.P.   ______________________________ Evelyn Gallo, MD    JO/MEDQ  D:  12/20/2010  T:  12/20/2010  Job:  [id number]  Electronically Signed by Limmie PatriciaP. on 12/21/2010 09:02:22 AM Electronically Signed by Evelyn Gallo MD on 12/21/2010 04:21:38 PM

## 2010-12-25 ENCOUNTER — Emergency Department (HOSPITAL_COMMUNITY)
Admission: EM | Admit: 2010-12-25 | Discharge: 2010-12-26 | Disposition: A | Discharge status: Home / Self Care | Payer: Medicaid Other | Attending: Emergency Medicine | Admitting: Emergency Medicine

## 2010-12-25 DIAGNOSIS — F3289 Other specified depressive episodes: Secondary | ICD-10-CM | POA: Insufficient documentation

## 2010-12-25 DIAGNOSIS — Z79899 Other long term (current) drug therapy: Secondary | ICD-10-CM | POA: Insufficient documentation

## 2010-12-25 DIAGNOSIS — R5381 Other malaise: Secondary | ICD-10-CM | POA: Insufficient documentation

## 2010-12-25 DIAGNOSIS — F329 Major depressive disorder, single episode, unspecified: Secondary | ICD-10-CM | POA: Insufficient documentation

## 2010-12-25 DIAGNOSIS — R5383 Other fatigue: Secondary | ICD-10-CM | POA: Insufficient documentation

## 2010-12-25 LAB — URINE MICROSCOPIC-ADD ON

## 2010-12-25 LAB — BASIC METABOLIC PANEL
BUN: 7 mg/dL (ref 6–23)
CO2: 27 mEq/L (ref 19–32)
Calcium: 9.2 mg/dL (ref 8.4–10.5)
Chloride: 102 mEq/L (ref 96–112)
Creatinine, Ser: 0.69 mg/dL (ref 0.50–1.10)
GFR calc Af Amer: >60 mL/min (ref >60)
GFR calc non Af Amer: >60 mL/min (ref >60)
Glucose, Bld: 79 mg/dL (ref 70–99)
Potassium: 3.3 mEq/L — ABNORMAL LOW (ref 3.5–5.1)
Sodium: 137 mEq/L (ref 135–145)

## 2010-12-25 LAB — URINALYSIS, ROUTINE W REFLEX MICROSCOPIC
Bilirubin Urine: NEGATIVE
Glucose, UA: NEGATIVE mg/dL
Hgb urine dipstick: NEGATIVE
Ketones, ur: 15 mg/dL — AB
Nitrite: NEGATIVE
Protein, ur: NEGATIVE mg/dL
Specific Gravity, Urine: 1.027 (ref 1.005–1.030)
Urobilinogen, UA: 1 mg/dL (ref 0.0–1.0)
pH: 6.5 (ref 5.0–8.0)

## 2010-12-25 LAB — ETHANOL: Alcohol, Ethyl (B): <11 mg/dL (ref 0–11)

## 2010-12-25 LAB — CBC
HCT: 36.7 % (ref 36.0–46.0)
Hemoglobin: 12.3 g/dL (ref 12.0–15.0)
MCH: 29.1 pg (ref 26.0–34.0)
MCHC: 33.5 g/dL (ref 30.0–36.0)
MCV: 87 fL (ref 78.0–100.0)
Platelets: 254 10*3/uL (ref 150–400)
RBC: 4.22 MIL/uL (ref 3.87–5.11)
RDW: 12.2 % (ref 11.5–15.5)
WBC: 5.9 10*3/uL (ref 4.0–10.5)

## 2010-12-25 LAB — RAPID URINE DRUG SCREEN, HOSP PERFORMED
Amphetamines: NOT DETECTED
Barbiturates: NOT DETECTED
Benzodiazepines: NOT DETECTED
Cocaine: NOT DETECTED
Opiates: NOT DETECTED
Tetrahydrocannabinol: NOT DETECTED

## 2010-12-26 NOTE — Discharge Summary (Signed)
  [name], [name]           ACCOUNT NO.:  [id number]  MEDICAL RECORD NO.:  [id number]  LOCATION:  0500                          FACILITY:  [facility]  PHYSICIAN:  Franchot Gallo, MD     DATE OF BIRTH:  1980-12-04  DATE OF ADMISSION:  12/19/2010 DATE OF DISCHARGE:  12/23/2010                              DISCHARGE SUMMARY   REASON FOR ADMISSION:  This is a 30 year old female that presented to the emergency department complaining of an overdose, feeling more depressed at home and, having suicidal thoughts on her medication.  She had overdosed on five Ambien 10-mg tablets and five Xanax.  FINAL IMPRESSION:  Axis I:  Major depressive disorder, recurrent. Axis II:  Borderline personality disorder. Axis III:  History of gastroesophageal reflux disease and asthma. Axis IV:  Chronic mental illness. Axis V:  Global assessment of functioning at discharge is 65.  PERTINENT LABS:  Her CBC:  Hemoglobin 11.8, hematocrit 34.7.  Urinalysis negative.  Urine pregnancy test was negative.  Alcohol level less than 11.  Salicylate level less than 2.  SIGNIFICANT FINDINGS:  The patient was admitted to the adult milieu for safety and stabilization.  We reviewed her medications.  We discontinued her prior medications and placed the patient on Effexor XR 75 mg and added Trileptal 300 mg b.i.d. for mood stabilization.  Her ACT team had also visited the patient during her hospital stay.  We had contact with her husband to provide for [place] to provide information and for [place] to gather any safety concerns.  She was reporting then good sleep, good appetite, having mild depressive symptoms rating it a 1-2 on a scale of 1-10.  Denied any suicidal or homicidal thoughts or auditory hallucinations.  Having minimal anxiety rating it a 2.  She was participating in groups.  She continued to do well, but having some early morning awakening and reported that she would never hurt herself again.  We changed her Benadryl  to a later time to help her with her sleep.  On day of discharge the patient's sleep was good, appetite was good, having mild depressive symptoms rating it 2.  Adamantly denied any suicidal or homicidal thoughts or auditory hallucinations.  The patient was seen by the interdisciplinary treatment team for further questions and concerns.  The patient was doing well and was stable to be discharged.  DISCHARGE MEDICATIONS: 1. Benadryl 50 mg one q.h.s. for sleep. 2. Trileptal 300 mg one b.i.d. 3. Effexor XR 75 mg one daily. 4. Flonase 2 sprays as needed. 5. Ibuprofen 800 mg q.8 h. p.r.n. for pain. 6. Iron over-the-counter daily. 7. Nitroglycerin for chest pain. 8. Omeprazole 20 mg for heartburn. 9. Vitamin D2 50,000 units weekly. 10.Zyrtec as needed.  The patient was to stop taking her Celexa.  FOLLOW-UP APPOINTMENT:  Top priority with the ACT, phone number [phone].     Landry Corporal, N.P.   ______________________________ Franchot Gallo, MD    JO/MEDQ  D:  12/23/2010  T:  12/23/2010  Job:  [id number] Electronically Signed by Limmie PatriciaP. on 12/26/2010 09:19:16 AM Electronically Signed by Franchot Gallo MD on 12/26/2010 05:23:11 PM

## 2010-12-27 LAB — URINE CULTURE
Colony Count: NO GROWTH
Culture  Setup Time: 201209031243
Culture: NO GROWTH

## 2011-01-16 ENCOUNTER — Inpatient Hospital Stay (INDEPENDENT_AMBULATORY_CARE_PROVIDER_SITE_OTHER)
Admission: RE | Admit: 2011-01-16 | Discharge: 2011-01-16 | Disposition: A | Discharge status: Home / Self Care | Payer: Medicaid Other | Source: Ambulatory Visit | Attending: Family Medicine | Admitting: Family Medicine

## 2011-01-16 DIAGNOSIS — IMO0002 Reserved for concepts with insufficient information to code with codable children: Secondary | ICD-10-CM

## 2011-01-23 LAB — URINALYSIS, ROUTINE W REFLEX MICROSCOPIC
Bilirubin Urine: NEGATIVE
Bilirubin Urine: NEGATIVE
Glucose, UA: NEGATIVE
Glucose, UA: NEGATIVE
Hgb urine dipstick: NEGATIVE
Ketones, ur: 15 — AB
Ketones, ur: 15 — AB
Nitrite: NEGATIVE
Nitrite: NEGATIVE
Protein, ur: 30 — AB
Protein, ur: NEGATIVE
Specific Gravity, Urine: 1.028
Specific Gravity, Urine: 1.028
Urobilinogen, UA: 0.2
Urobilinogen, UA: 1
pH: 6
pH: 6

## 2011-01-23 LAB — RPR: RPR Ser Ql: NONREACTIVE

## 2011-01-23 LAB — POCT PREGNANCY, URINE: Preg Test, Ur: NEGATIVE

## 2011-01-23 LAB — WET PREP, GENITAL
Clue Cells Wet Prep HPF POC: NONE SEEN
Trich, Wet Prep: NONE SEEN
WBC, Wet Prep HPF POC: NONE SEEN
Yeast Wet Prep HPF POC: NONE SEEN

## 2011-01-23 LAB — GC/CHLAMYDIA PROBE AMP, GENITAL
Chlamydia, DNA Probe: NEGATIVE
GC Probe Amp, Genital: NEGATIVE

## 2011-01-23 LAB — URINE MICROSCOPIC-ADD ON

## 2011-03-24 ENCOUNTER — Encounter: Payer: Self-pay | Admitting: Family Medicine

## 2011-03-24 ENCOUNTER — Emergency Department (HOSPITAL_COMMUNITY)
Admission: EM | Admit: 2011-03-24 | Discharge: 2011-03-24 | Disposition: A | Discharge status: Other Acute Inpatient Hospital | Payer: Medicaid Other | Source: Home / Self Care | Attending: Emergency Medicine | Admitting: Emergency Medicine

## 2011-03-24 ENCOUNTER — Encounter (HOSPITAL_COMMUNITY): Payer: Self-pay

## 2011-03-24 ENCOUNTER — Inpatient Hospital Stay (HOSPITAL_COMMUNITY)
Admission: AD | Admit: 2011-03-24 | Discharge: 2011-03-28 | DRG: 885 | Disposition: A | Discharge status: Home / Self Care | Payer: Medicaid Other | Source: Ambulatory Visit | Attending: Psychiatry | Admitting: Psychiatry

## 2011-03-24 DIAGNOSIS — F603 Borderline personality disorder: Secondary | ICD-10-CM

## 2011-03-24 DIAGNOSIS — Z7982 Long term (current) use of aspirin: Secondary | ICD-10-CM

## 2011-03-24 DIAGNOSIS — K219 Gastro-esophageal reflux disease without esophagitis: Secondary | ICD-10-CM | POA: Insufficient documentation

## 2011-03-24 DIAGNOSIS — M129 Arthropathy, unspecified: Secondary | ICD-10-CM | POA: Insufficient documentation

## 2011-03-24 DIAGNOSIS — F29 Unspecified psychosis not due to a substance or known physiological condition: Principal | ICD-10-CM

## 2011-03-24 DIAGNOSIS — Z880 Allergy status to penicillin: Secondary | ICD-10-CM

## 2011-03-24 DIAGNOSIS — F329 Major depressive disorder, single episode, unspecified: Secondary | ICD-10-CM | POA: Insufficient documentation

## 2011-03-24 DIAGNOSIS — T424X4A Poisoning by benzodiazepines, undetermined, initial encounter: Secondary | ICD-10-CM

## 2011-03-24 DIAGNOSIS — T50901A Poisoning by unspecified drugs, medicaments and biological substances, accidental (unintentional), initial encounter: Secondary | ICD-10-CM

## 2011-03-24 DIAGNOSIS — F3289 Other specified depressive episodes: Secondary | ICD-10-CM | POA: Insufficient documentation

## 2011-03-24 DIAGNOSIS — X838XXA Intentional self-harm by other specified means, initial encounter: Secondary | ICD-10-CM

## 2011-03-24 DIAGNOSIS — Z79899 Other long term (current) drug therapy: Secondary | ICD-10-CM

## 2011-03-24 DIAGNOSIS — T43591A Poisoning by other antipsychotics and neuroleptics, accidental (unintentional), initial encounter: Secondary | ICD-10-CM | POA: Insufficient documentation

## 2011-03-24 HISTORY — DX: Gastro-esophageal reflux disease without esophagitis: K21.9

## 2011-03-24 HISTORY — DX: Borderline personality disorder: F60.3

## 2011-03-24 HISTORY — DX: Depression, unspecified: F32.A

## 2011-03-24 HISTORY — DX: Major depressive disorder, single episode, unspecified: F32.9

## 2011-03-24 LAB — URINALYSIS, ROUTINE W REFLEX MICROSCOPIC
Bilirubin Urine: NEGATIVE
Glucose, UA: NEGATIVE mg/dL
Hgb urine dipstick: NEGATIVE
Ketones, ur: NEGATIVE mg/dL
Leukocytes, UA: NEGATIVE
Nitrite: NEGATIVE
Protein, ur: NEGATIVE mg/dL
Specific Gravity, Urine: 1.009 (ref 1.005–1.030)
Urobilinogen, UA: 0.2 mg/dL (ref 0.0–1.0)
pH: 6.5 (ref 5.0–8.0)

## 2011-03-24 LAB — CBC
HCT: 37.1 % (ref 36.0–46.0)
Hemoglobin: 12.1 g/dL (ref 12.0–15.0)
MCH: 28.9 pg (ref 26.0–34.0)
MCHC: 32.6 g/dL (ref 30.0–36.0)
MCV: 88.5 fL (ref 78.0–100.0)
Platelets: 255 10*3/uL (ref 150–400)
RBC: 4.19 MIL/uL (ref 3.87–5.11)
RDW: 12.5 % (ref 11.5–15.5)
WBC: 5.3 10*3/uL (ref 4.0–10.5)

## 2011-03-24 LAB — COMPREHENSIVE METABOLIC PANEL
ALT: 11 U/L (ref 0–35)
AST: 17 U/L (ref 0–37)
Albumin: 3.6 g/dL (ref 3.5–5.2)
Alkaline Phosphatase: 81 U/L (ref 39–117)
BUN: 8 mg/dL (ref 6–23)
CO2: 28 mEq/L (ref 19–32)
Calcium: 9.4 mg/dL (ref 8.4–10.5)
Chloride: 102 mEq/L (ref 96–112)
Creatinine, Ser: 0.77 mg/dL (ref 0.50–1.10)
GFR calc Af Amer: >90 mL/min (ref >90)
GFR calc non Af Amer: >90 mL/min (ref >90)
Glucose, Bld: 52 mg/dL — ABNORMAL LOW (ref 70–99)
Potassium: 3.3 mEq/L — ABNORMAL LOW (ref 3.5–5.1)
Sodium: 136 mEq/L (ref 135–145)
Total Bilirubin: 0.2 mg/dL — ABNORMAL LOW (ref 0.3–1.2)
Total Protein: 7.7 g/dL (ref 6.0–8.3)

## 2011-03-24 LAB — SALICYLATE LEVEL
Salicylate Lvl: 4.7 mg/dL (ref 2.8–20.0)
Salicylate Lvl: 10.9 mg/dL (ref 2.8–20.0)
Salicylate Lvl: 11.2 mg/dL (ref 2.8–20.0)
Salicylate Lvl: 8.9 mg/dL (ref 2.8–20.0)

## 2011-03-24 LAB — DIFFERENTIAL
Basophils Absolute: 0.1 10*3/uL (ref 0.0–0.1)
Basophils Relative: 1 % (ref 0–1)
Eosinophils Absolute: 0.4 10*3/uL (ref 0.0–0.7)
Eosinophils Relative: 7 % — ABNORMAL HIGH (ref 0–5)
Lymphocytes Relative: 42 % (ref 12–46)
Lymphs Abs: 2.2 10*3/uL (ref 0.7–4.0)
Monocytes Absolute: 0.4 10*3/uL (ref 0.1–1.0)
Monocytes Relative: 7 % (ref 3–12)
Neutro Abs: 2.2 10*3/uL (ref 1.7–7.7)
Neutrophils Relative %: 42 % — ABNORMAL LOW (ref 43–77)

## 2011-03-24 LAB — ACETAMINOPHEN LEVEL
Acetaminophen (Tylenol), Serum: <15 ug/mL (ref 10–30)
Acetaminophen (Tylenol), Serum: <15 ug/mL (ref 10–30)
Acetaminophen (Tylenol), Serum: <15 ug/mL (ref 10–30)

## 2011-03-24 LAB — RAPID URINE DRUG SCREEN, HOSP PERFORMED
Amphetamines: NOT DETECTED
Barbiturates: NOT DETECTED
Benzodiazepines: NOT DETECTED
Cocaine: NOT DETECTED
Opiates: NOT DETECTED
Tetrahydrocannabinol: NOT DETECTED

## 2011-03-24 LAB — PROTIME-INR
INR: 0.95 (ref 0.00–1.49)
Prothrombin Time: 12.9 seconds (ref 11.6–15.2)

## 2011-03-24 LAB — ETHANOL: Alcohol, Ethyl (B): <11 mg/dL (ref 0–11)

## 2011-03-24 LAB — PREGNANCY, URINE: Preg Test, Ur: NEGATIVE

## 2011-03-24 MED ORDER — LORAZEPAM 1 MG PO TABS: 1.0000 mg | ORAL_TABLET | Freq: Three times a day (TID) | ORAL | Status: DC | PRN

## 2011-03-24 MED ORDER — ONDANSETRON HCL 4 MG PO TABS: 4.0000 mg | ORAL_TABLET | Freq: Three times a day (TID) | ORAL | Status: DC | PRN

## 2011-03-24 MED ORDER — ALUM & MAG HYDROXIDE-SIMETH 200-200-20 MG/5ML PO SUSP: 30.0000 mL | ORAL | Status: DC | PRN

## 2011-03-24 MED ORDER — HYDROXYZINE HCL 25 MG PO TABS
25.0000 mg | ORAL_TABLET | Freq: Every evening | ORAL | Status: DC | PRN
  Administered 2011-03-24 – 2011-03-27 (×3): 25 mg via ORAL
  Filled 2011-03-24: qty 1

## 2011-03-24 MED ORDER — NICOTINE 21 MG/24HR TD PT24: 21.0000 mg | MEDICATED_PATCH | Freq: Every day | TRANSDERMAL | Status: DC | PRN

## 2011-03-24 MED ORDER — MAGNESIUM HYDROXIDE 400 MG/5ML PO SUSP: 30.0000 mL | Freq: Every day | ORAL | Status: DC | PRN

## 2011-03-24 MED ORDER — ACETAMINOPHEN 325 MG PO TABS: 650.0000 mg | ORAL_TABLET | ORAL | Status: DC | PRN

## 2011-03-24 MED ORDER — ACETAMINOPHEN 325 MG PO TABS: 650.0000 mg | ORAL_TABLET | Freq: Four times a day (QID) | ORAL | Status: DC | PRN

## 2011-03-24 MED ORDER — ZOLPIDEM TARTRATE 5 MG PO TABS: 5.0000 mg | ORAL_TABLET | Freq: Every evening | ORAL | Status: DC | PRN

## 2011-03-24 MED ORDER — IBUPROFEN 200 MG PO TABS: 400.0000 mg | ORAL_TABLET | Freq: Three times a day (TID) | ORAL | Status: DC | PRN

## 2011-03-24 NOTE — ED Notes (Signed)
Per EMS: Pt from home lives with family. Reports pt took 8 xanax, 4- 325 mg aspirins and 4 tsp of baby benadryl at 10:00 this morning. Pt has hx of depression and cannot sleep. Denies SI, states she was trying to rest.

## 2011-03-24 NOTE — ED Notes (Signed)
Pt st's she took 8 xanax, 6-7 benadryl, and 4 325mg  aspirin, pt is alert and oriented x4 but very sleepy and drowsy.  Airway intact, respiratory effort adequate.  Pt st's this is not the first time she has ODed on her prescription meds, st's that she knows how much she is supposed to take but took more because she was "tired and wanted to sleep."  Pt st's she is depressed.  St's she is not suicidal but she st's she has ODed 4 times before a few months ago.  St's "I'm tryint to stay out the hospital."

## 2011-03-24 NOTE — ED Notes (Signed)
JXB:JYNW<GN> Expected date:03/24/11<BR> Expected time:10:30 AM<BR> Means of arrival:Ambulance<BR> Comments:<BR> overdose

## 2011-03-24 NOTE — ED Notes (Signed)
Ladona Ridgel, RN found pt belongings.  One patient belonging bag locked in activity room.

## 2011-03-24 NOTE — BH Assessment (Signed)
Assessment Note   [name] is an 30 y.o. female. She was assessed today by staff during the previous shift. Pt was referred to [facility] for a in-patient admission. Writer informed that patient has been accepted Environmental consultant to [facility]. Writer has notified the EDP Jonell Cluck, MD) that patient has been accepted and he agrees to place patient up for discharge. Pt's support paperwork has been completed by this Clinical research associate. Pt's nurse is aware of patients disposition.  Pt will be transported accordingly.  Melynda Ripple, MS

## 2011-03-24 NOTE — ED Notes (Signed)
Accepted at behavioral health, Dr. Dan Humphreys.   Patient resting comfortably.  VSS.  No complaints.  ASA level has peaked.  BP 109/64  Pulse 95  Temp(Src) 97.9 F (36.6 C) (Oral)  Resp 20  SpO2 99%   Glynn Octave, MD 03/24/11 2127

## 2011-03-24 NOTE — ED Notes (Signed)
Pt changed into blue scrubs and red socks. Belonging and patient waned by security. Inventory: 1 pair of black socks, gray tank top, and zebra bra. Belongings located underneath nurses station across from room.

## 2011-03-24 NOTE — ED Notes (Signed)
Per RN, Ladona Ridgel patient's husband took belongings home.

## 2011-03-24 NOTE — ED Provider Notes (Signed)
History     CSN: [id number] Arrival date & time: 03/24/2011 10:43 AM    Chief Complaint  Patient presents with  . Drug Overdose   Patient is a 30 y.o. female presenting with Overdose. The history is provided by the patient and the EMS personnel. History Limited By: uncooperative.  Drug Overdose  Pt was seen at 1110.  Per EMS and pt, c/o pt took 8 xanax, 4 ASA, and 4tsp of baby benadryl at 10am this morning PTA "because I was tired and wanted to rest." Has hx of doing same for the past several months.  Has hx of depression.    Past Medical History  Diagnosis Date  . Depression   . GERD (gastroesophageal reflux disease)   . Arthritis   . Borderline personality disorder     History reviewed. No pertinent past surgical history.   History  Substance Use Topics  . Smoking status: Never Smoker   . Smokeless tobacco: Not on file  . Alcohol Use: No    Review of Systems  Unable to perform ROS: Other    Allergies  Amoxicillin; Imitrex; and Penicillins  Home Medications   Current Outpatient Rx  Name Route Sig Dispense Refill  . ACETAMINOPHEN 325 MG PO TABS Oral Take 650 mg by mouth every 6 (six) hours as needed.      . ALPRAZOLAM 0.5 MG PO TABS Oral Take 0.5 mg by mouth at bedtime as needed.      . ASPIRIN 325 MG PO TABS Oral Take 325 mg by mouth daily.      Marland Kitchen VITAMIN B12 100 MCG PO TABS Oral Take 100 mcg by mouth daily.        BP 104/68  Pulse 94  Temp(Src) 97.5 F (36.4 C) (Oral)  Resp 15  SpO2 100%  Physical Exam 1115: Physical examination:  Nursing notes reviewed; Vital signs and O2 SAT reviewed;  Constitutional: Well developed, Well nourished, Well hydrated, In no acute distress; Head:  Normocephalic, atraumatic; Eyes: EOMI, PERRL, No scleral icterus; ENMT: Mouth and pharynx normal, Mucous membranes moist; Neck: Supple, Full range of motion, No lymphadenopathy; Cardiovascular: Regular rate and rhythm, No murmur, rub, or gallop; Respiratory: Breath sounds clear &  equal bilaterally, No rales, rhonchi, wheezes, or rub, Normal respiratory effort/excursion; Chest: Nontender, Movement normal; Abdomen: Soft, Nontender, Nondistended, Normal bowel sounds; Extremities: Pulses normal, No tenderness, No edema, No calf edema or asymmetry.; Neuro: AA&Ox3, Major CN grossly intact.  No gross focal motor or sensory deficits in extremities.; Skin: Color normal, Warm, Dry, Psych:  Affect flat, poor eye contact (eyes closed during exam).    ED Course  Procedures    MDM  MDM Reviewed: nursing note, vitals and previous chart Interpretation: labs   Results for orders placed during the hospital encounter of 03/24/11  CBC      Component Value Range   WBC 5.3  4.0 - 10.5 (K/uL)   RBC 4.19  3.87 - 5.11 (MIL/uL)   Hemoglobin 12.1  12.0 - 15.0 (g/dL)   HCT 40.9  81.1 - 91.4 (%)   MCV 88.5  78.0 - 100.0 (fL)   MCH 28.9  26.0 - 34.0 (pg)   MCHC 32.6  30.0 - 36.0 (g/dL)   RDW 78.2  95.6 - 21.3 (%)   Platelets 255  150 - 400 (K/uL)  DIFFERENTIAL      Component Value Range   Neutrophils Relative 42 (*) 43 - 77 (%)   Neutro Abs 2.2  1.7 - 7.7 (  K/uL)   Lymphocytes Relative 42  12 - 46 (%)   Lymphs Abs 2.2  0.7 - 4.0 (K/uL)   Monocytes Relative 7  3 - 12 (%)   Monocytes Absolute 0.4  0.1 - 1.0 (K/uL)   Eosinophils Relative 7 (*) 0 - 5 (%)   Eosinophils Absolute 0.4  0.0 - 0.7 (K/uL)   Basophils Relative 1  0 - 1 (%)   Basophils Absolute 0.1  0.0 - 0.1 (K/uL)  PROTIME-INR      Component Value Range   Prothrombin Time 12.9  11.6 - 15.2 (seconds)   INR 0.95  0.00 - 1.49   COMPREHENSIVE METABOLIC PANEL      Component Value Range   Sodium 136  135 - 145 (mEq/L)   Potassium 3.3 (*) 3.5 - 5.1 (mEq/L)   Chloride 102  96 - 112 (mEq/L)   CO2 28  19 - 32 (mEq/L)   Glucose, Bld 52 (*) 70 - 99 (mg/dL)   BUN 8  6 - 23 (mg/dL)   Creatinine, Ser 0.98  0.50 - 1.10 (mg/dL)   Calcium 9.4  8.4 - 11.9 (mg/dL)   Total Protein 7.7  6.0 - 8.3 (g/dL)   Albumin 3.6  3.5 - 5.2 (g/dL)     AST 17  0 - 37 (U/L)   ALT 11  0 - 35 (U/L)   Alkaline Phosphatase 81  39 - 117 (U/L)   Total Bilirubin 0.2 (*) 0.3 - 1.2 (mg/dL)   GFR calc non Af Amer >90  >90 (mL/min)   GFR calc Af Amer >90  >90 (mL/min)  URINE RAPID DRUG SCREEN (HOSP PERFORMED)      Component Value Range   Opiates NONE DETECTED  NONE DETECTED    Cocaine NONE DETECTED  NONE DETECTED    Benzodiazepines NONE DETECTED  NONE DETECTED    Amphetamines NONE DETECTED  NONE DETECTED    Tetrahydrocannabinol NONE DETECTED  NONE DETECTED    Barbiturates NONE DETECTED  NONE DETECTED   ETHANOL      Component Value Range   Alcohol, Ethyl (B) <11  0 - 11 (mg/dL)  URINALYSIS, ROUTINE W REFLEX MICROSCOPIC      Component Value Range   Color, Urine YELLOW  YELLOW    APPearance CLEAR  CLEAR    Specific Gravity, Urine 1.009  1.005 - 1.030    pH 6.5  5.0 - 8.0    Glucose, UA NEGATIVE  NEGATIVE (mg/dL)   Hgb urine dipstick NEGATIVE  NEGATIVE    Bilirubin Urine NEGATIVE  NEGATIVE    Ketones, ur NEGATIVE  NEGATIVE (mg/dL)   Protein, ur NEGATIVE  NEGATIVE (mg/dL)   Urobilinogen, UA 0.2  0.0 - 1.0 (mg/dL)   Nitrite NEGATIVE  NEGATIVE    Leukocytes, UA NEGATIVE  NEGATIVE   SALICYLATE LEVEL      Component Value Range   Salicylate Lvl 4.7  2.8 - 20.0 (mg/dL)  ACETAMINOPHEN LEVEL      Component Value Range   Acetaminophen (Tylenol), Serum <15.0  10 - 30 (ug/mL)  PREGNANCY, URINE      Component Value Range   Preg Test, Ur NEGATIVE    ACETAMINOPHEN LEVEL      Component Value Range   Acetaminophen (Tylenol), Serum <15.0  10 - 30 (ug/mL)  SALICYLATE LEVEL      Component Value Range   Salicylate Lvl 10.9  2.8 - 20.0 (mg/dL)  SALICYLATE LEVEL      Component Value Range   Salicylate  Lvl 11.2  2.8 - 20.0 (mg/dL)  ACETAMINOPHEN LEVEL      Component Value Range   Acetaminophen (Tylenol), Serum <15.0  10 - 30 (ug/mL)     7:38 PM:  4 hours post ingestion ASA and APAP levels continue non-toxic.  Pt AAOx3, resps easy.  Has tol PO  well without N/V while in ED.  VSS.  ACT to eval.  Holding orders written.   Sign out to Dr. Manus Gunning.         Mililani Murthy Allison Quarry, DO 03/24/11 1939
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
## 2011-03-24 NOTE — ED Notes (Signed)
Pt states she "took some medicine this morning" because she was tired and trying to sleep.  Says she was not trying to hurt herself or others.  Has had previous SA as overdose and cuttings on arms.

## 2011-03-25 ENCOUNTER — Encounter (HOSPITAL_COMMUNITY): Payer: Self-pay

## 2011-03-25 DIAGNOSIS — F329 Major depressive disorder, single episode, unspecified: Secondary | ICD-10-CM

## 2011-03-25 DIAGNOSIS — F332 Major depressive disorder, recurrent severe without psychotic features: Secondary | ICD-10-CM

## 2011-03-25 NOTE — Progress Notes (Signed)
Pt is a 30 yr old female that was transferred from [facility] after medical clearance for OD on 8 xanax, 4- 325 mg aspirins and 4 tsp of baby benadryl at 10 am on Friday 11/30. Pt denies this as a suicide attempt, instead she was trying to get some rest. Although, pt has been documented with a previous suicide attempt by OD and self-inflicted stratches on her arm, that placed her at [facility] in august 2012. During this admission the pt was drowsy and guarded with her responses. Pt reported that her husband was worried after she OD on the pills and called the EMS for help. She now states that her goal is to get out of here as soon as possible. During the suicide  risk assessment pt does describe her want to become medically stable (anxiety) so she can return back to work. During the skin assessment scars of scratches on her left arm was present. Pt arrives with minimal belongings that were sent on the unit. No locker at this time. Pt has been orientated to the units rules and regulations.

## 2011-03-25 NOTE — Progress Notes (Signed)
03/25/2011 Nrsg 1835  [name] is seen OOB UAL on the 500 hall tolerated fair. Her affect is falt and depressed. She is seen interacting appropriately with her peers, but is quiet and somewhat seclusive.\ A She has attended her groups and completed her self inventory this AM, stating she had depression and hopelessness of 4/2, denied SI and stated her goal was to :" stop allowing herslef to get so upset and hurt herslef." R Safety is maintained and POC includes fostering therapeutic realtionship already established. PD RN [facility]

## 2011-03-25 NOTE — Progress Notes (Signed)
Suicide Risk Assessment  Admission Assessment     Demographic factors:  Assessment Details Time of Assessment: Admission Current Mental Status:  Current Mental Status:  (denies si) Loss Factors:    Historical Factors:  Historical Factors: Family history of mental illness or substance abuse Risk Reduction Factors:  Risk Reduction Factors: Responsible for children under 31 years of age;Positive social support  CLINICAL FACTORS:   Depression:   Impulsivity  COGNITIVE FEATURES THAT CONTRIBUTE TO RISK:  Polarized thinking    SUICIDE RISK:   Minimal: No identifiable suicidal ideation.  Patients presenting with no risk factors but with morbid ruminations; may be classified as minimal risk based on the severity of the depressive symptoms  PLAN OF CARE: I saw the patient and review of medical records. 30 year old [place] American female with history of depression and multiple suicide attempts. Patient took number of medications but she is denying that it was a suicide attempt patient was unable to give the reason behind why she took so many medications but adamantly denied that it was a suicide attempt and  asked  to be discharged. Patient seemed to have poor insight and judgment into her illness she don't want to be started on any medications. I explained to the patient that she will be here at least 2-3 days to go to the group therapy and work on her coping skills. Psychoeducation given to the patient. Will get more collateral information on the patient.   [name] 03/25/2011, 7:59 AM

## 2011-03-25 NOTE — Progress Notes (Signed)
Adult Psychosocial Assessment Update Interdisciplinary Team  Previous [facility] admissions/discharges:  Admissions Discharges  Date:December 19, 2010 Date: December 23, 2010  Date: Date:  Date: Date:  Date: Date:  Date: Date:   Changes since the last Psychosocial Assessment (including adherence to outpatient mental health and/or substance abuse treatment, situational issues contributing to decompensation and/or relapse). Pt. Stated that she was referred to the ACT Team and was compliant for 3 weeks. Pt. Stated that the ACT team gave her more anxiety and was too overwhelming for her. Pt. Stated that the ACT Team did home visits 3 times per week. Pt. Stated that she felt that every professional she spoke with asked her repetitive questions. Pt. Continues to be on medical leave from her job at [employer] due to panic attacks and overdosing.              Discharge Plan 1. Will you be returning to the same living situation after discharge?   Yes: * No:      If no, what is your plan?           2. Would you like a referral for services when you are discharged? Yes:*     If yes, for what services?  No:       Pt. Does not want to be referred to the ACT team but would like a counselor in [facility]. Pt. Is requesting morning appointments due to her lack of transportation.       Summary and Recommendations (to be completed by the evaluator) Pt. Is a 30 y.o. African American woman diagnosed with Major Depressive Disorder, recurrent, and Borderline Personality Disorder. Pt. Has a history of overdosing. Recommendation for treatment includes: crisis stabilization, medication management, case management, psychotherapy to teach coping skills and group therapy.                        Signature:  Neila Gear, 03/25/2011 11:45 AM

## 2011-03-25 NOTE — Progress Notes (Signed)
Pt came to medication window and reported that she knew she did not have any scheduled medications but requested a prn of visteril to aid with sleep which was given. Pt. denies having pain, -si,hi, a/v hall. Safety maintained on the unit, will continue to monitor.

## 2011-03-25 NOTE — Progress Notes (Signed)
Patient ID: Rosine Abe, female   DOB: October 18, 1980, 30 y.o.   MRN: [id number]   [facility] Group Notes:  (Counselor/Nursing/MHT/Case Management/Adjunct)  03/25/2011 1:15 PM  Type of Therapy:  Group Therapy  Participation Level:  Active  Participation Quality:  Appropriate  Affect:  Appropriate  Cognitive:  Appropriate  Insight:  Good  Engagement in Group:  Good  Engagement in Therapy:  Good  Modes of Intervention:  Clarification, Problem-solving, Role-play, Socialization and Support  Summary of Progress/Problems:   Group identified what the signs are of self-sabotaging behaviors. Group members stated at least one positive behavior to change the identified sabotaging behavior. Pt shared that she is "doing ok" and that she realizes that her high expectations is a form of self-sabotage. She appropriately engaged in group discussion by sharing her personal experiences and providing support to peers. She stated that she is going to change her thinking as her positive behavior.   Thomasena Edis, [employer]

## 2011-03-25 NOTE — Progress Notes (Signed)
[facility] Adult Inpatient Family/Significant Other Suicide Prevention Education  Suicide Prevention Education:  Education Completed; Damon Shuford ([phone]) or [phone]) has been identified by the patient as the family member/significant other with whom the patient will be residing, and identified as the person(s) who will aid the patient in the event of a mental health crisis (suicidal ideations/suicide attempt).  With written consent from the patient, the family member/significant other has been provided the following suicide prevention education, prior to the and/or following the discharge of the patient.  The suicide prevention education provided includes the following:  Suicide risk factors  Suicide prevention and interventions  National Suicide Hotline telephone number  [facility] assessment telephone number  [facility] Emergency Assistance 911  [facility] and/or Residential Mobile Crisis Unit telephone number  Request made of family/significant other to:  Remove weapons (e.g., guns, rifles, knives), all items previously/currently identified as safety concern.  Pt.'s husband states there no weapons/guns in the home.  Remove drugs/medications (over-the-counter, prescriptions, illicit drugs), all items previously/currently identified as a safety concern. Pt.'s husband states that he had the pt.'s medication locked up in a lock box and will secure home before the pt. Is d/c.  Pt. Husband discussed briefly the pt.'s past history of SI and problems with depression. Pt.'s husbands states the pt. Is depressed she does not have a job. Pt.'s husband states the pt. Will do better for a while and then the depression returns along with the SI attempts. Pt. denied SI attempt in group but pt.'s husband feels the pt. was trying to harm herself and does not be live the pt. Pt.'s husband can be contacted at phone numbers above.  The family member/significant other  verbalizes understanding of the suicide prevention education information provided.  The family member/significant other agrees to remove the items of safety concern listed above.  Lamar Blinks [place] 03/25/2011, 12:03 PM

## 2011-03-25 NOTE — Progress Notes (Signed)
Pt. attended and participated in aftercare planning group. Suicide prevention information was given as well as suicide risk warning sign information and crisis line numbers to use.  Pt. Stated that she is refusing all medications because she feels as though the last time at [facility] the medications were "too strong" and that she was "close to death".

## 2011-03-25 NOTE — H&P (Signed)
Psychiatric Admission Assessment Adult  Patient Identification:  [name] Date of Evaluation:  03/25/2011  History of Present Illness:: Husband called EMS as he thought patient had overdosed. He apparently keeps her meds locked up and hadn't put them up when she took Xanax Aspirin  and liquid Benadryl "to rest".  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   Past Psychiatric History:Has been here in past and quit the ACT team. Doesn't want meds only Xanax prescribed  By [facility] Dr.Alevere. Gets it from [facility].   Substance Abuse History: Denies and no evidence   Social History:    reports that she has never smoked. She does not have any smokeless tobacco history on file. She reports that she does not drink alcohol or use illicit drugs.  Family Psych History:  Past Medical History:     Past Medical History  Diagnosis Date  . Depression   . GERD (gastroesophageal reflux disease)   . Borderline personality disorder        Past Surgical History  Procedure Date  . Abdominal hysterectomy     Allergies:  Allergies  Allergen Reactions  . Amoxicillin   . Imitrex (Sumatriptan Base)   . Penicillins     Current Medications:  Prior to Admission medications   Medication Sig Start Date End Date Taking? Authorizing Provider  acetaminophen (TYLENOL) 325 MG tablet Take 650 mg by mouth every 6 (six) hours as needed.      Historical Provider, MD  ALPRAZolam Prudy Feeler) 0.5 MG tablet Take 0.5 mg by mouth at bedtime as needed.      Historical Provider, MD  aspirin 325 MG tablet Take 325 mg by mouth daily.      Historical Provider, MD  vitamin B-12 (CYANOCOBALAMIN) 100 MCG tablet Take 100 mcg by mouth daily.      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  AXIS III See medical history.  AXIS IV problems with primary support group  AXIS V 51-60 moderate symptoms   Treatment Plan Summary:  Adjust meds as indicated. Patient refusing. Agree with H&P done in ED

## 2011-03-25 NOTE — Tx Team (Signed)
Initial Interdisciplinary Treatment Plan  PATIENT STRENGTHS: (choose at least two) Ability for insight General fund of knowledge Physical Health  PATIENT STRESSORS: Financial difficulties Occupational concerns family issues ( children split with her and husband with living arrangements)   PROBLEM LIST: Problem List/Patient Goals Date to be addressed Date deferred Reason deferred Estimated date of resolution  Depression 11/30     Anxiety 11/30     Denies SI attempt But OD in attempt " to just get some sleep" 11/30                                          DISCHARGE CRITERIA:  Ability to meet basic life and health needs Adequate post-discharge living arrangements Motivation to continue treatment in a less acute level of care Need for constant or close observation no longer present Safe-care adequate arrangements made  PRELIMINARY DISCHARGE PLAN: Attend aftercare/continuing care group Outpatient therapy Referrals indicated:    PATIENT/FAMIILY INVOLVEMENT: This treatment plan has been presented to and reviewed with the patient, [name].  The patient and family have been given the opportunity to ask questions and make suggestions.  Talitha Givens, RN 03/25/2011, 4:28 AM

## 2011-03-26 MED ORDER — DIPHENHYDRAMINE HCL 25 MG PO CAPS
50.0000 mg | ORAL_CAPSULE | Freq: Every evening | ORAL | Status: DC | PRN
  Administered 2011-03-26 – 2011-03-27 (×2): 50 mg via ORAL
  Filled 2011-03-26: qty 1

## 2011-03-26 MED ORDER — LORATADINE 10 MG PO TABS
10.0000 mg | ORAL_TABLET | Freq: Every day | ORAL | Status: DC
  Administered 2011-03-26 – 2011-03-28 (×3): 10 mg via ORAL
  Filled 2011-03-26 (×4): qty 1

## 2011-03-26 NOTE — Progress Notes (Signed)
Patient ID: [name], female   DOB: February 17, 1981, 30 y.o.   MRN: [id number] 03/26/2011 Nrsg 1845 D Pt is seen OOB UAL on the 500 hall today...tolerated fair. She cont to be sad, depressed and flat. She minimizes her problems..She  Stays focused on others' problems, as opposed to working on her issues. A She requested and was  given a Loratidine for complaints of congested sinus.Shecompleted her slef inventory this AM, on she wrote that her depression and hopelessness are 3/1 and she denied that she is having SI. R Safety is maintaiend and POC includes fostering therapeutic comunication. PD RN [facility]

## 2011-03-26 NOTE — Progress Notes (Signed)
[facility] Group Notes:  (Counselor/Nursing/MHT/Case Management/Adjunct)  03/26/2011 1315PM  Type of Therapy:  Psychoeducational Skills  Participation Level:  Active  Participation Quality:  Appropriate  Affect:  Appropriate  Cognitive:  Appropriate  Insight:  Good  Engagement in Group:  Good  Engagement in Therapy:  Good  Modes of Intervention:  Clarification, Problem-solving and Support  Summary of Progress/Problems: Pt. participated in group session on supports. Pt. was asked what support means to them, who are their supports, what is the difference between unhealthy and healthy supports and what they can do when their support is not there. Pt. Stated that she considers her husband a support in her life.    [name] 03/26/2011, 3:42 PM

## 2011-03-26 NOTE — Progress Notes (Signed)
D:  Pt was in dayroom with sheet tied like a scarf around neck.  Underwriter told pt to remove sheet that is loosely tied around neck.   Pt easily redirected and said she did not realize she was doing that.  A: Reported actions of pt to RN.  R:  Pt is safe. Aundria Rud, Dex Blakely L

## 2011-03-26 NOTE — Progress Notes (Signed)
Pt. attended and participated in aftercare planning group. [facility] support group information was given. Pt. stated that she is anxious today.

## 2011-03-26 NOTE — Progress Notes (Signed)
  [name] is a 30 y.o. female [id number] 20-Mar-1981  03/24/2011 Active Problems:  * No active hospital problems. *    Mental Status: Up dressed in her own clothes active in [place]. Denies SI. Says she had nightmares and this has kept her anxious today. Still afraid of meds and continues to exhibit poor insight and judgment.       Filed Vitals:   03/26/11 0710  BP: 113/79  Pulse: 84  Temp:   Resp:     Lab Results:   BMET    Component Value Date/Time   NA 136 03/24/2011 1115   K 3.3* 03/24/2011 1115   CL 102 03/24/2011 1115   CO2 28 03/24/2011 1115   GLUCOSE 52* 03/24/2011 1115   BUN 8 03/24/2011 1115   CREATININE 0.77 03/24/2011 1115   CALCIUM 9.4 03/24/2011 1115   GFRNONAA >90 03/24/2011 1115   GFRAA >90 03/24/2011 1115    Medications:  Scheduled:    PRN Meds acetaminophen, alum & mag hydroxide-simeth, hydrOXYzine, magnesium hydroxide Plan: need a family session with husband.           Patient refuses ACT team but does want therapy.  Juventino Pavone,MICKIE D. 03/26/2011

## 2011-03-27 NOTE — Tx Team (Signed)
Interdisciplinary Treatment Plan Update (Adult)  Date:  03/27/2011  Time Reviewed:  10:28 AM   Progress in Treatment: Attending groups: Yes Participating in groups:  Yes Taking medication as prescribed: Yes Tolerating medication:  Yes Family/Significant othe contact made:  Yes Patient understands diagnosis:  Yes Discussing patient identified problems/goals with staff:  Yes Medical problems stabilized or resolved:  Yes Denies suicidal/homicidal ideation: Yes Issues/concerns per patient self-inventory:  None identified Other: N/A  New problem(s) identified: None Identified  Reason for Continuation of Hospitalization: Depression  Interventions implemented related to continuation of hospitalization: mood stabilization, medication monitoring and adjustment, group therapy and psycho education, safety checks q 15 mins  Additional comments: N/A  Estimated length of stay: 1 day  Discharge Plan: Pt will follow up at [facility] for medication management and therapy.    New goal(s): N/A  Review of initial/current patient goals per problem list:   1.  Goal(s): Depression  Met:  No  Target date: by discharge  As evidenced by: Reduce depression from a 10 to a 3.   2.  Goal (s): Suicidal Ideation  Met:  No  Target date: by discharge  As evidenced by: Eliminate suicidal ideation.   3.  Goal(s): Anxiety  Met:  No  Target date: by discharge  As evidenced by: Reduce anxiety from a 10 to a 3.   4.  Goal(s):  Met:  No  Target date: by discharge  As evidenced by:   Attendees: Patient:  [name] 03/27/2011 11:00 am  Family:     Physician:  Orson Aloe, MD  03/27/2011  10:28 AM   Nursing:   Neill Loft, RN 03/27/2011 10:28 AM   Case Manager:  Reyes Ivan, LCSWA 03/27/2011  10:28 AM   Counselor:  Angus Palms, LCSW 03/27/2011  10:28 AM   Other:  Juline Patch, LCSW 03/27/2011  10:28 AM   Other:  Quintella Reichert, RN 03/27/2011 10:29 AM     Other:     Other:      Scribe for Treatment Team:   Carmina Miller, 03/27/2011 , 10:28 AM

## 2011-03-27 NOTE — Progress Notes (Signed)
Patient ID: [name], female   DOB: [birth year], 30 y.o.   MRN: [id number] Pt interacting in the dayroom at this time, smiles frequently.  She denies SI/HI/AVH, 2/10 for depression and minimal anxiety.  [name] reports being upset earlier when she was not discharged.  She said they had not told her that she would be discharged today, she just had it in her mind that she would be.  [name] states she is Ok with that decision and is ready for discharge tomorrow.

## 2011-03-27 NOTE — Progress Notes (Signed)
Patient ID: [name], female   DOB: 1980/07/16, 30 y.o.   MRN: [id number] Pt requested d/c today but did not have a written safety plan.  After meeting with treatment team she agreed to write out a safety plan and d/c will be viewed for tomorrow.  Pt has been attending groups.  She says she wants something that is not too strong for anxiety.  Says that she is very sensitive to medications and taking them scares her.  She ranks her depression a 3 and her hoplessness a 1.  She denies thoughts of suicide.

## 2011-03-27 NOTE — Progress Notes (Signed)
Pt attended discharge planning group and actively participated.  Pt presents with calm mood and affect.  Pt was open with sharing the reason for entering the hospital.  Pt states that she was d/c from [facility] awhile ago and started working with an ACT team.  Pt states that she was overwhelmed with this because there were multiple staff coming to her and asking the same questions.  Pt states that she also had a bad reaction to the medication she was d/c from and stopped taking it.  Pt discharged from the ACT team and started seeking other resources in the community, with no luck.  Pt states she got upset about this and overdosed.  Pt states that she is open to any referral, but only wants outpatient with a therapist and dr.  Armanda Magic will refer pt to [facility] for medication management and therapy.  Pt will return home with husband and 3 kids.  Pt has transportation home.  Pt wants to d/c soon, stating her husband is missing work to watch the children at home.  Safety planning and suicide prevention discussed.     Reyes Ivan, LCSWA 03/27/2011  11:41 AM

## 2011-03-27 NOTE — Progress Notes (Signed)
Pt seen in the dayroom interacting appropriately with select peers. Pt reported that her sleep medication that she took on last night caused her to have nightmares and she wanted to see if she could get  Benadryl for sleep instead of the visteril. Pt was informed that once an order was received from the dr. she would be notified. Pt currently denies having pain, -si/hi/a/v hall. Safety maintained on unit, will continue to monitor.

## 2011-03-27 NOTE — Progress Notes (Signed)
[facility] Group Notes:  (Counselor/Nursing/MHT/Case Management/Adjunct)  03/27/2011 3:33 PM  Type of Therapy:  Group Therapy  Participation Level:  Active  Participation Quality:  Appropriate, Attentive and Sharing  Affect:  Appropriate  Cognitive:  Alert and Appropriate  Insight:  Good  Engagement in Group:  Good  Engagement in Therapy:  Good  Modes of Intervention:  Support and Exploration  Summary of Progress/Problems: [name] explored her concept of mindfulness. She talked about how being more mindful could benefit her by not focusing on what needs to be done and then becoming overwhelmed and depressed by it. [name] processed her belief that since she is not working and making money she must take care of the house perfectly, and acknowledged how this gets in the way of her wellbeing. She shared that she gets very focused on what she feels she has to do and misses out on the important things like moments with her sons.    [name] 03/27/2011, 3:33 PM

## 2011-03-27 NOTE — Progress Notes (Signed)
[facility] Group Notes:  (Counselor/Nursing/MHT/Case Management/Adjunct)  03/27/2011 2:11 PM  Type of Therapy:  Group Therapy  Participation Level:  Active  Participation Quality:  Appropriate, Attentive and Sharing  Affect:  Appropriate  Cognitive:  Alert and Appropriate  Insight:  Good  Engagement in Group:  Good  Engagement in Therapy:  Good  Modes of Intervention:  Support and Exploration  Summary of Progress/Problems: Rupa explored ways that the journey to wellness is like a marathon, mentioning that everyone must go at their own pace and that it takes a lot of practice and preparation. She was able to generalize these things and talk about how one should practice wellness techniques in various aspects of life - physical, social, and emotional. Lloyd emphasized the importance of believing in oneself and finding meaning in what one is doing. She also talked about her need for others around her to lift her up and work with her, rather than pushing her down and away from her goals.    [name] 03/27/2011, 2:11 PM

## 2011-03-28 DIAGNOSIS — F39 Unspecified mood [affective] disorder: Secondary | ICD-10-CM

## 2011-03-28 MED ORDER — HYDROXYZINE HCL 25 MG PO TABS: 25.0000 mg | ORAL_TABLET | Freq: Every evening | ORAL | Status: DC | PRN

## 2011-03-28 NOTE — Progress Notes (Signed)
Suicide Risk Assessment  Discharge Assessment     Demographic factors:  Assessment Details Time of Assessment: Admission Current Mental Status:  Current Mental Status:  (denies si) Risk Reduction Factors:  Risk Reduction Factors: Responsible for children under 31 years of age;Positive social support  CLINICAL FACTORS:   Previous Psychiatric Diagnoses and Treatments Medical Diagnoses and Treatments/Surgeries  COGNITIVE FEATURES THAT CONTRIBUTE TO RISK:  No cognitive risks factors noted.    SUICIDE RISK:   Minimal: No identifiable suicidal ideation.  Patients presenting with no risk factors but with morbid ruminations; may be classified as minimal risk based on the severity of the depressive symptoms  Patient denies suicidal or homicidal ideation, hallucinations, illusions, or delusions. Patient engages with good eye contact, is able to focus adequately in a one to one setting, and has clear goal directed thoughts. Patient speaks with a natural conversational volume, rate, and tone. Anxiety was reported at 1 on a scale of 1 the least and 10 the most. Depression was reported also at 1 on the same scale. Patient is oriented times 4, recent and remote memory intact. Judgement: Improved from admission Insight: Improved from admission Plan: Follow-up with [facility].    [name] 03/28/2011, 1:59 PM

## 2011-03-28 NOTE — Discharge Summary (Addendum)
Discharge Summary  Patient:  [name] is an 30 y.o., female DOB:  06-Sep-1980  Date of Admission:  03/24/2011  Date of Discharge:  03/28/2011  Axis Diagnosis:   Axis I: Psychotic Disorder NOS Axis II: Deferred Axis III:  Past Medical History  Diagnosis Date  . Depression   . GERD (gastroesophageal reflux disease)   . Borderline personality disorder    Axis IV: other psychosocial or environmental problems and problems with primary support group Axis V: 51-60 moderate symptoms  Hospital Course: Patient was admitted to the acute mood disordered unit.  She was not given any medications, but learned a lot from the group, milieu, and individual therapy.  She learned from the peers on the unit as well. She decided that she needed to reach out and ask for help when she needed it, stay on her medications, and be kind to herself.  This made a large difference for her.  She was referred to the [facility] program of [facility].  The goal would be for her to rebuild her self esteem and be the confident wonderful person that she is.  Condition on Discharge: Patient denies suicidal or homicidal ideation, hallucinations, illusions, or delusions. Patient engages with good eye contact, is able to focus adequately in a one to one setting, and has clear goal directed thoughts. Patient speaks with a natural conversational volume, rate, and tone. Anxiety was reported at 1 on a scale of 1 the least and 10 the most. Depression was reported also at 1 on the same scale. Patient is oriented times 4, recent and remote memory intact. Judgement: Improved from admission Insight: Improved from admission Plan: Follow-up with [facility].  Medications: . loratadine  10 mg Oral Daily   Dominico Rod 03/28/2011, 2:20 PM

## 2011-03-28 NOTE — Progress Notes (Signed)
Group focus was on Losses and grief. Group engaged in conversation around key losses in their lives and how they are experiencing grief related to the losses relevant to them. Discussion also occurred regarding how their grief reactions were influenced in their childhood and what can help them now in finding their way through their grief. Group was supportive of one another and members found many points of common experiences. Patient identified a loss as her ability to feel good about herself and to sustain self-confidence. She identified with others as they talked about the importance of a job as bring meaning and a feeling of being independent.   Kathlyn Sacramento Director of [facility]

## 2011-03-28 NOTE — Progress Notes (Signed)
Patient ID: [name], female   DOB: [birth year], 30 y.o.   MRN: [id number] Pt was discharged ambulatory with her husband.  She denies SI/HI.  She verbalizes understanding of d/c followup and she is on no medications.  She was given suicide prevention information and she presented a suicide prevention safety plan to treatment team today.  She is hopeful about the future.

## 2011-03-28 NOTE — Discharge Summary (Addendum)
Discharge Note  Patient:  [name] is an 30 y.o., female DOB:  10/18/1980  Date of Admission:  03/24/2011  Date of Discharge:  03/28/2011  Level of Care:  OP  Discharge destination:  Home  Is patient on multiple antipsychotic therapies at discharge:  No    The patient received suicide prevention pamphlet:  Yes Belongings returned:  Judene Companion, Cherese Lozano 03/28/2011, 2:12 PM

## 2011-03-28 NOTE — Tx Team (Signed)
Interdisciplinary Treatment Plan Update (Adult)  Date:  03/28/2011  Time Reviewed:  11:12 AM   Progress in Treatment: Attending groups: Yes Participating in groups:  Yes Taking medication as prescribed: Yes Tolerating medication:  Yes Family/Significant othe contact made:  Yes Patient understands diagnosis:  Yes Discussing patient identified problems/goals with staff:  Yes Medical problems stabilized or resolved:  Yes Denies suicidal/homicidal ideation: Yes Issues/concerns per patient self-inventory:  None identified Other: N/A  New problem(s) identified: None Identified  Reason for Continuation of Hospitalization: Stable to d/c  Interventions implemented related to continuation of hospitalization: Stable to d/c  Additional comments: N/A  Estimated length of stay: D/C today   Discharge Plan: Pt will follow up at [facility] the [place] for medication management and therapy.    New goal(s): N/A  Review of initial/current patient goals per problem list:   1.  Goal(s): Depression  Met:  Yes  Target date: by discharge  As evidenced by: Reduce depression from a 10 to a 3. Pt ranks depression at a 2 today.    2.  Goal (s): Suicidal Ideation  Met:  Yes  Target date: by discharge  As evidenced by: Eliminate suicidal ideation. Pt denies SI.   3.  Goal(s): Anxiety  Met:  Yes  Target date: by discharge  As evidenced by: Reduce anxiety from a 10 to a 3. Pt ranks anxiety at a 3 today.    4.  Goal(s):  Met:  No  Target date: by discharge  As evidenced by:   Attendees: Patient:  [name] 03/28/2011 11:14 AM   Family:     Physician:  Orson Aloe, MD  03/28/2011  11:12 AM   Nursing:   Quintella Reichert, RN  03/28/2011 11:15 AM   Case Manager:  Reyes Ivan, LCSWA 03/28/2011  11:12 AM   Counselor:  Angus Palms, LCSW 03/28/2011  11:12 AM   Other:  Juline Patch, LCSW 03/28/2011  11:12 AM   Other:  Serena Colonel, FNP 03/28/2011 11:15 AM   Other:       Other:      Scribe for Treatment Team:   Carmina Miller, 03/28/2011 , 11:12 AM

## 2011-03-28 NOTE — Discharge Summary (Signed)
Pt attended discharge planning group and actively participated.  Pt presents with bright mood and affect.  Pt reports feeling stable to d/c today.  Pt ranks depression at a 2 and anxiety at a 3 today.  Pt denies SI.  Pt states her plan to stay well is to follow up with appointments, go to support groups and utilize counseling services through her church.  Pt states she left a voicemail for the counseling dept at her church already.  Pt states that she is still skeptical about taking medication and was not started on any here, but may consider medications if necessary through her outpt.  Pt can not go to [facility] due to them not accepting Medicaid.  Pt referred to [facility] of the [place] instead for medication management and therapy.  Pt to return home and has transportation.  Safety planning and suicide prevention discussed.   No further d/c needs voiced.  Pt stable to d/c.  Reyes Ivan, LCSWA 03/28/2011  9:40 AM
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
## 2011-03-30 NOTE — Progress Notes (Signed)
Patient Discharge Instructions:  Admission Note Faxed,  03/30/2011 Discharge Note Faxed,   03/30/2011 After Visit Summary Faxed,  03/30/2011 Faxed to the Next Level Care provider:  03/30/2011 Facesheet faxed 03/30/2011 D/C summary faxed 03/30/2011  Faxed to [facility] of the [facility] @ [phone]  Wandra Scot, 03/30/2011, 7:04 PM

## 2011-07-01 ENCOUNTER — Emergency Department (HOSPITAL_COMMUNITY)
Admission: EM | Admit: 2011-07-01 | Discharge: 2011-07-02 | Disposition: A | Discharge status: Home / Self Care | Payer: Medicaid Other | Attending: Emergency Medicine | Admitting: Emergency Medicine

## 2011-07-01 ENCOUNTER — Encounter (HOSPITAL_COMMUNITY): Payer: Self-pay | Admitting: *Deleted

## 2011-07-01 DIAGNOSIS — R6889 Other general symptoms and signs: Secondary | ICD-10-CM

## 2011-07-01 DIAGNOSIS — R509 Fever, unspecified: Secondary | ICD-10-CM | POA: Insufficient documentation

## 2011-07-01 DIAGNOSIS — R0602 Shortness of breath: Secondary | ICD-10-CM | POA: Insufficient documentation

## 2011-07-01 DIAGNOSIS — F3289 Other specified depressive episodes: Secondary | ICD-10-CM | POA: Insufficient documentation

## 2011-07-01 DIAGNOSIS — K219 Gastro-esophageal reflux disease without esophagitis: Secondary | ICD-10-CM | POA: Insufficient documentation

## 2011-07-01 DIAGNOSIS — F329 Major depressive disorder, single episode, unspecified: Secondary | ICD-10-CM | POA: Insufficient documentation

## 2011-07-01 DIAGNOSIS — R05 Cough: Secondary | ICD-10-CM | POA: Insufficient documentation

## 2011-07-01 DIAGNOSIS — R059 Cough, unspecified: Secondary | ICD-10-CM | POA: Insufficient documentation

## 2011-07-01 MED ORDER — ONDANSETRON 4 MG PO TBDP
4.0000 mg | ORAL_TABLET | Freq: Once | ORAL | Status: AC
  Administered 2011-07-02: 4 mg via ORAL
  Filled 2011-07-01 (×2): qty 1

## 2011-07-01 MED ORDER — DIPHENHYDRAMINE HCL 25 MG PO CAPS
25.0000 mg | ORAL_CAPSULE | Freq: Once | ORAL | Status: AC
  Administered 2011-07-02: 25 mg via ORAL
  Filled 2011-07-01: qty 1

## 2011-07-01 MED ORDER — IBUPROFEN 200 MG PO TABS
600.0000 mg | ORAL_TABLET | Freq: Once | ORAL | Status: AC
  Administered 2011-07-02: 600 mg via ORAL
  Filled 2011-07-01: qty 3

## 2011-07-01 MED ORDER — ACETAMINOPHEN 325 MG PO TABS
650.0000 mg | ORAL_TABLET | Freq: Once | ORAL | Status: AC
  Administered 2011-07-01: 650 mg via ORAL
  Filled 2011-07-01: qty 1

## 2011-07-01 NOTE — ED Provider Notes (Signed)
History     CSN: [id number]  Arrival date & time 07/01/11  1944   First MD Initiated Contact with Patient 07/01/11 2251      Chief Complaint  Patient presents with  . Fever    (Consider location/radiation/quality/duration/timing/severity/associated sxs/prior treatment) Patient is a 31 y.o. female presenting with fever. The history is provided by the patient. No language interpreter was used.  Fever Primary symptoms of the febrile illness include fever, fatigue, cough, shortness of breath, nausea and myalgias. Primary symptoms do not include wheezing, vomiting, diarrhea or dysuria. The current episode started 2 days ago. This is a new problem. The problem has not changed since onset.  Reports flu like symptoms with fever cough and nausea and general body aches and pains for over 48 hours. Febrile with cough.  Appears ill. Will check cbc, istat and chest x-ray and urine.    Past Medical History  Diagnosis Date  . Depression   . GERD (gastroesophageal reflux disease)   . Borderline personality disorder     Past Surgical History  Procedure Date  . Abdominal hysterectomy     Family History  Problem Relation Age of Onset  . Heart attack Mother     History  Substance Use Topics  . Smoking status: Never Smoker   . Smokeless tobacco: Not on file  . Alcohol Use: No    OB History    Grav Para Term Preterm Abortions TAB SAB Ect Mult Living                  Review of Systems  Constitutional: Positive for fever and fatigue.  Respiratory: Positive for cough and shortness of breath. Negative for wheezing.   Gastrointestinal: Positive for nausea. Negative for vomiting and diarrhea.  Genitourinary: Negative for dysuria.  Musculoskeletal: Positive for myalgias.  All other systems reviewed and are negative.    Allergies  Amoxicillin; Imitrex; and Penicillins  Home Medications   Current Outpatient Rx  Name Route Sig Dispense Refill  . ALBUTEROL SULFATE HFA 108 (90 BASE)  MCG/ACT IN AERS Inhalation Inhale 2 puffs into the lungs every 6 (six) hours as needed. SHORTNESS OF BREATH    . GABAPENTIN 400 MG PO CAPS Oral Take 400 mg by mouth 3 (three) times daily.    Marland Kitchen LORATADINE 10 MG PO TABS Oral Take 10 mg by mouth daily.    Marland Kitchen NITROGLYCERIN 0.4 MG SL SUBL Sublingual Place 0.4 mg under the tongue every 5 (five) minutes as needed. CHEST PAIN    . OMEPRAZOLE 20 MG PO CPDR Oral Take 20 mg by mouth daily.    . SERTRALINE HCL 100 MG PO TABS Oral Take 100 mg by mouth daily.      BP 117/60  Pulse 112  Temp(Src) 100.3 F (37.9 C) (Oral)  Resp 22  SpO2 100%  Physical Exam  Nursing note and vitals reviewed. Constitutional: She is oriented to person, place, and time. She appears well-developed and well-nourished.  HENT:  Head: Normocephalic and atraumatic.  Eyes: Conjunctivae and EOM are normal. Pupils are equal, round, and reactive to light.  Neck: Normal range of motion. Neck supple.  Cardiovascular: Normal rate, regular rhythm, normal heart sounds and intact distal pulses.   Pulmonary/Chest: Effort normal and breath sounds normal.  Abdominal: Soft.  Musculoskeletal: Normal range of motion. She exhibits no edema and no tenderness.  Neurological: She is alert and oriented to person, place, and time. She has normal reflexes.  Skin: Skin is warm and dry.  Psychiatric: She has a normal mood and affect.    ED Course  Procedures (including critical care time)  Labs Reviewed - No data to display No results found.   No diagnosis found.    MDM  31yo female with flu-like symptoms > 48 hours.   Wbc 4.5, Fever controlled with ibuprofen and tylenol.  Percocet for body aches. Feels better after meds in ER.  Will follow up with Dr. Concepcion Elk next week or return for severe pain or SOB.          Jethro Bastos, NP 07/03/11 1251  Medical screening examination/treatment/procedure(s) were performed by non-physician practitioner and as supervising physician I was  immediately available for consultation/collaboration.  Sunnie Nielsen, MD 07/03/11 [phone]

## 2011-07-01 NOTE — ED Notes (Signed)
Patient c/o shortness of breath and thinks her asthma is acting up.

## 2011-07-01 NOTE — ED Notes (Signed)
Patient with flu like symptoms for about three days.  Patient c/o coughing, fever, generalized bodyaches, headache, and chest soreness.  Has been taking OTC without any relief

## 2011-07-02 ENCOUNTER — Emergency Department (HOSPITAL_COMMUNITY): Payer: Medicaid Other

## 2011-07-02 LAB — POCT I-STAT, CHEM 8
BUN: <3 mg/dL — ABNORMAL LOW (ref 6–23)
Calcium, Ion: 1.17 mmol/L (ref 1.12–1.32)
Chloride: 105 mEq/L (ref 96–112)
Creatinine, Ser: 1 mg/dL (ref 0.50–1.10)
Glucose, Bld: 112 mg/dL — ABNORMAL HIGH (ref 70–99)
HCT: 38 % (ref 36.0–46.0)
Hemoglobin: 12.9 g/dL (ref 12.0–15.0)
Potassium: 3.4 mEq/L — ABNORMAL LOW (ref 3.5–5.1)
Sodium: 140 mEq/L (ref 135–145)
TCO2: 21 mmol/L (ref 0–100)

## 2011-07-02 LAB — URINALYSIS, ROUTINE W REFLEX MICROSCOPIC
Bilirubin Urine: NEGATIVE
Glucose, UA: NEGATIVE mg/dL
Hgb urine dipstick: NEGATIVE
Ketones, ur: NEGATIVE mg/dL
Leukocytes, UA: NEGATIVE
Nitrite: NEGATIVE
Protein, ur: NEGATIVE mg/dL
Specific Gravity, Urine: 1.012 (ref 1.005–1.030)
Urobilinogen, UA: 1 mg/dL (ref 0.0–1.0)
pH: 7.5 (ref 5.0–8.0)

## 2011-07-02 LAB — CBC
HCT: 36 % (ref 36.0–46.0)
Hemoglobin: 12.1 g/dL (ref 12.0–15.0)
MCH: 28.5 pg (ref 26.0–34.0)
MCHC: 33.6 g/dL (ref 30.0–36.0)
MCV: 84.9 fL (ref 78.0–100.0)
Platelets: 222 10*3/uL (ref 150–400)
RBC: 4.24 MIL/uL (ref 3.87–5.11)
RDW: 12.5 % (ref 11.5–15.5)
WBC: 4.5 10*3/uL (ref 4.0–10.5)

## 2011-07-02 MED ORDER — OXYCODONE-ACETAMINOPHEN 5-325 MG PO TABS: 1.0000 | ORAL_TABLET | Freq: Once | ORAL | Status: DC

## 2011-07-02 MED ORDER — ONDANSETRON HCL 4 MG PO TABS: 4.0000 mg | ORAL_TABLET | Freq: Four times a day (QID) | ORAL | Status: AC

## 2011-07-02 MED ORDER — HYDROCOD POLST-CHLORPHEN POLST 10-8 MG/5ML PO LQCR: 5.0000 mL | Freq: Two times a day (BID) | ORAL | Status: DC

## 2011-07-02 MED ORDER — HYDROCODONE-IBUPROFEN 7.5-200 MG PO TABS: 1.0000 | ORAL_TABLET | Freq: Four times a day (QID) | ORAL | Status: AC | PRN

## 2011-07-02 NOTE — Discharge Instructions (Signed)
[name] you chest x-ray shows no pneumonia.  I think you are having flu-like symtoms.  Take ibuprofen and the narcotic pain meds but do not drive with the percocet. The zofran is for nausea if you need it.  Take ibuprofen or tylenol for fever as well.  Follow up with your PCP if not better by Monday. Return for severe pain, nausea vomiting diarrhea or stiff neck.      Bronchitis Bronchitis is a problem of the air tubes leading to your lungs. This problem makes it hard for air to get in and out of the lungs. You may cough a lot because your air tubes are narrow. Going without care can cause lasting (chronic) bronchitis. HOME CARE   Drink enough fluids to keep your pee (urine) clear or pale yellow.   Use a cool mist humidifier.   Quit smoking if you smoke. If you keep smoking, the bronchitis might not get better.   Only take medicine as told by your doctor.  GET HELP RIGHT AWAY IF:   Coughing keeps you awake.   You start to wheeze.   You become more sick or weak.   You have a hard time breathing or get short of breath.   You cough up blood.   Coughing lasts more than 2 weeks.   You have a fever.   Your baby is older than 3 months with a rectal temperature of 102 F (38.9 C) or higher.   Your baby is 60 months old or younger with a rectal temperature of 100.4 F (38 C) or higher.  MAKE SURE YOU:  Understand these instructions.   Will watch your condition.   Will get help right away if you are not doing well or get worse.  Document Released: 09/27/2007 Document Revised: 03/30/2011 Document Reviewed: 03/12/2009 [facility] Patient Information 2012 [place], [place].

## 2011-10-28 ENCOUNTER — Encounter (HOSPITAL_COMMUNITY): Payer: Self-pay

## 2011-10-28 ENCOUNTER — Emergency Department (HOSPITAL_COMMUNITY)
Admission: EM | Admit: 2011-10-28 | Discharge: 2011-10-28 | Disposition: A | Discharge status: Home / Self Care | Payer: Medicaid Other | Attending: Emergency Medicine | Admitting: Emergency Medicine

## 2011-10-28 DIAGNOSIS — F419 Anxiety disorder, unspecified: Secondary | ICD-10-CM

## 2011-10-28 DIAGNOSIS — F3289 Other specified depressive episodes: Secondary | ICD-10-CM | POA: Insufficient documentation

## 2011-10-28 DIAGNOSIS — F329 Major depressive disorder, single episode, unspecified: Secondary | ICD-10-CM | POA: Insufficient documentation

## 2011-10-28 DIAGNOSIS — K219 Gastro-esophageal reflux disease without esophagitis: Secondary | ICD-10-CM | POA: Insufficient documentation

## 2011-10-28 DIAGNOSIS — F411 Generalized anxiety disorder: Secondary | ICD-10-CM | POA: Insufficient documentation

## 2011-10-28 LAB — CBC
HCT: 39.3 % (ref 36.0–46.0)
Hemoglobin: 13.4 g/dL (ref 12.0–15.0)
MCH: 29.1 pg (ref 26.0–34.0)
MCHC: 34.1 g/dL (ref 30.0–36.0)
MCV: 85.4 fL (ref 78.0–100.0)
Platelets: 291 10*3/uL (ref 150–400)
RBC: 4.6 MIL/uL (ref 3.87–5.11)
RDW: 12.5 % (ref 11.5–15.5)
WBC: 5.1 10*3/uL (ref 4.0–10.5)

## 2011-10-28 LAB — COMPREHENSIVE METABOLIC PANEL
ALT: 9 U/L (ref 0–35)
AST: 21 U/L (ref 0–37)
Albumin: 4 g/dL (ref 3.5–5.2)
Alkaline Phosphatase: 79 U/L (ref 39–117)
BUN: 7 mg/dL (ref 6–23)
CO2: 23 mEq/L (ref 19–32)
Calcium: 9.7 mg/dL (ref 8.4–10.5)
Chloride: 102 mEq/L (ref 96–112)
Creatinine, Ser: 0.68 mg/dL (ref 0.50–1.10)
GFR calc Af Amer: >90 mL/min (ref >90)
GFR calc non Af Amer: >90 mL/min (ref >90)
Glucose, Bld: 98 mg/dL (ref 70–99)
Potassium: 3.6 mEq/L (ref 3.5–5.1)
Sodium: 135 mEq/L (ref 135–145)
Total Bilirubin: 0.3 mg/dL (ref 0.3–1.2)
Total Protein: 8.4 g/dL — ABNORMAL HIGH (ref 6.0–8.3)

## 2011-10-28 LAB — ACETAMINOPHEN LEVEL: Acetaminophen (Tylenol), Serum: <15 ug/mL (ref 10–30)

## 2011-10-28 LAB — RAPID URINE DRUG SCREEN, HOSP PERFORMED
Amphetamines: NOT DETECTED
Barbiturates: NOT DETECTED
Benzodiazepines: NOT DETECTED
Cocaine: NOT DETECTED
Opiates: NOT DETECTED
Tetrahydrocannabinol: NOT DETECTED

## 2011-10-28 LAB — POCT PREGNANCY, URINE: Preg Test, Ur: NEGATIVE

## 2011-10-28 LAB — ETHANOL: Alcohol, Ethyl (B): <11 mg/dL (ref 0–11)

## 2011-10-28 MED ORDER — LORAZEPAM 1 MG PO TABS
1.0000 mg | ORAL_TABLET | Freq: Once | ORAL | Status: AC
  Administered 2011-10-28: 1 mg via ORAL
  Filled 2011-10-28: qty 1

## 2011-10-28 NOTE — ED Notes (Signed)
Pt states a lot going on states yells at kids a lot, states dealing with a lot of stressors, states "i don't trust medications because they make me yell, depressed, and withdrawn" states a couple of weeks ago felt suicidal, plan to take all medication states at present no s/i or h/i pt is calm at present denies pain

## 2011-10-28 NOTE — ED Notes (Signed)
Pt in via ems with anxiety, per ems lots of stressors this week, was found on the couch crying states can't deal anymore

## 2011-10-28 NOTE — ED Provider Notes (Signed)
History     CSN: [id number]  Arrival date & time 10/28/11  1524   First MD Initiated Contact with Patient 10/28/11 1652      Chief Complaint  Patient presents with  . Anxiety    (Consider location/radiation/quality/duration/timing/severity/associated sxs/prior treatment) HPI Comments: Patient here with increase in anxiety over the past several weeks - states that her medication was recently changed and that her PCP added klonopin to her medication as well and she is not feeling any better, she reports that she recently tried to help a neighbor who was also having psychiatric issues and this has sent her into a tailspin.  She denies homicidal or suicidal ideation but reports that she was feeling suicidal last week, none now.  She states that she wants to talk to a psychiatrist about her medications, she does not want to talk to ACT team or be admitted at [facility].  Patient is a 31 y.o. female presenting with anxiety. The history is provided by the patient. No language interpreter was used.  Anxiety This is a chronic problem. The current episode started today. The problem occurs constantly. The problem has been unchanged. Pertinent negatives include no abdominal pain, anorexia, arthralgias, change in bowel habit, chest pain, chills, congestion, coughing, diaphoresis, fatigue, fever, headaches, joint swelling, myalgias, nausea, neck pain, numbness, rash, sore throat, swollen glands, urinary symptoms, vertigo, visual change, vomiting or weakness. Nothing aggravates the symptoms. She has tried nothing for the symptoms. The treatment provided no relief.    Past Medical History  Diagnosis Date  . Depression   . GERD (gastroesophageal reflux disease)   . Borderline personality disorder     Past Surgical History  Procedure Date  . Abdominal hysterectomy     Family History  Problem Relation Age of Onset  . Heart attack Mother     History  Substance Use Topics  . Smoking status: Never Smoker    . Smokeless tobacco: Not on file  . Alcohol Use: No    OB History    Grav Para Term Preterm Abortions TAB SAB Ect Mult Living                  Review of Systems  Constitutional: Negative for fever, chills, diaphoresis and fatigue.  HENT: Negative for congestion, sore throat and neck pain.   Respiratory: Negative for cough.   Cardiovascular: Negative for chest pain.  Gastrointestinal: Negative for nausea, vomiting, abdominal pain, anorexia and change in bowel habit.  Musculoskeletal: Negative for myalgias, joint swelling and arthralgias.  Skin: Negative for rash.  Neurological: Negative for vertigo, weakness, numbness and headaches.  Psychiatric/Behavioral: Positive for dysphoric mood and decreased concentration. Negative for suicidal ideas and self-injury. The patient is nervous/anxious.   All other systems reviewed and are negative.    Allergies  Amoxicillin; Imitrex; and Penicillins  Home Medications   Current Outpatient Rx  Name Route Sig Dispense Refill  . ALBUTEROL SULFATE HFA 108 (90 BASE) MCG/ACT IN AERS Inhalation Inhale 2 puffs into the lungs every 6 (six) hours as needed. SHORTNESS OF BREATH    . AMPHETAMINE-DEXTROAMPHETAMINE 10 MG PO TABS Oral Take 5 mg by mouth 2 (two) times daily.    Marland Kitchen CETIRIZINE HCL 10 MG PO TABS Oral Take 10 mg by mouth daily.    Marland Kitchen CLONAZEPAM 0.5 MG PO TABS Oral Take 0.5 mg by mouth at bedtime as needed. Anxiety and sleep.    . CYCLOBENZAPRINE HCL 5 MG PO TABS Oral Take 5 mg by mouth at  bedtime as needed. Muscle spasms.    Marland Kitchen GABAPENTIN 300 MG PO CAPS Oral Take 300 mg by mouth 3 (three) times daily.    Marland Kitchen NITROGLYCERIN 0.4 MG SL SUBL Sublingual Place 0.4 mg under the tongue every 5 (five) minutes as needed. CHEST PAIN    . OMEPRAZOLE 20 MG PO CPDR Oral Take 20 mg by mouth daily.    . SERTRALINE HCL 100 MG PO TABS Oral Take 100 mg by mouth daily.      BP 125/78  Pulse 99  Resp 18  SpO2 100%  Physical Exam  Nursing note and vitals  reviewed. Constitutional: She is oriented to person, place, and time. She appears well-developed and well-nourished. No distress.  HENT:  Head: Normocephalic and atraumatic.  Right Ear: External ear normal.  Left Ear: External ear normal.  Nose: Nose normal.  Mouth/Throat: Oropharynx is clear and moist. No oropharyngeal exudate.  Eyes: Conjunctivae are normal. Pupils are equal, round, and reactive to light. No scleral icterus.  Neck: Normal range of motion. Neck supple. No JVD present. No tracheal deviation present. No thyromegaly present.  Cardiovascular: Normal rate, regular rhythm and normal heart sounds.  Exam reveals no gallop and no friction rub.   No murmur heard. Pulmonary/Chest: Effort normal and breath sounds normal. No respiratory distress. She has no wheezes. She has no rales. She exhibits no tenderness.  Abdominal: Soft. Bowel sounds are normal. She exhibits no distension. There is no tenderness.  Musculoskeletal: Normal range of motion. She exhibits no edema and no tenderness.  Lymphadenopathy:    She has no cervical adenopathy.  Neurological: She is alert and oriented to person, place, and time. No cranial nerve deficit. She exhibits normal muscle tone. Coordination normal.  Skin: Skin is warm and dry. No rash noted. No erythema. No pallor.  Psychiatric: Her behavior is normal. Judgment and thought content normal.       Anxious with flight of ideas, no SI/HI    ED Course  Procedures (including critical care time)   Labs Reviewed  CBC  URINE RAPID DRUG SCREEN (HOSP PERFORMED)  POCT PREGNANCY, URINE  COMPREHENSIVE METABOLIC PANEL  ETHANOL  ACETAMINOPHEN LEVEL   No results found.   Anxiety   MDM  Patient with a long history of depression and anxiety presents wanting to review her medications.  I have explained that we do not have a psychiatrist on staff, she is not homicidal or suicidal ideation at this time and does not want to speak to ACT or telepsych.  I have  given her ativan here orally and will discharge her - she states that she sees [facility] and will see them on Monday.        Izola Price [place], [place] 10/28/11 1726

## 2011-10-29 NOTE — ED Provider Notes (Signed)
Medical screening examination/treatment/procedure(s) were performed by non-physician practitioner and as supervising physician I was immediately available for consultation/collaboration.  Kleigh Hoelzer M Darol Cush, MD 10/29/11 1237 

## 2011-12-16 ENCOUNTER — Emergency Department (HOSPITAL_COMMUNITY)
Admission: EM | Admit: 2011-12-16 | Discharge: 2011-12-17 | Disposition: A | Discharge status: Home / Self Care | Payer: Medicaid Other | Attending: Emergency Medicine | Admitting: Emergency Medicine

## 2011-12-16 ENCOUNTER — Encounter (HOSPITAL_COMMUNITY): Payer: Self-pay | Admitting: *Deleted

## 2011-12-16 DIAGNOSIS — F3289 Other specified depressive episodes: Secondary | ICD-10-CM | POA: Insufficient documentation

## 2011-12-16 DIAGNOSIS — L509 Urticaria, unspecified: Secondary | ICD-10-CM | POA: Insufficient documentation

## 2011-12-16 DIAGNOSIS — F419 Anxiety disorder, unspecified: Secondary | ICD-10-CM

## 2011-12-16 DIAGNOSIS — F411 Generalized anxiety disorder: Secondary | ICD-10-CM | POA: Insufficient documentation

## 2011-12-16 DIAGNOSIS — F329 Major depressive disorder, single episode, unspecified: Secondary | ICD-10-CM | POA: Insufficient documentation

## 2011-12-16 DIAGNOSIS — K219 Gastro-esophageal reflux disease without esophagitis: Secondary | ICD-10-CM | POA: Insufficient documentation

## 2011-12-16 LAB — CBC
HCT: 38.2 % (ref 36.0–46.0)
Hemoglobin: 12.8 g/dL (ref 12.0–15.0)
MCH: 28.6 pg (ref 26.0–34.0)
MCHC: 33.5 g/dL (ref 30.0–36.0)
MCV: 85.5 fL (ref 78.0–100.0)
Platelets: 287 10*3/uL (ref 150–400)
RBC: 4.47 MIL/uL (ref 3.87–5.11)
RDW: 12.8 % (ref 11.5–15.5)
WBC: 7.7 10*3/uL (ref 4.0–10.5)

## 2011-12-16 LAB — BASIC METABOLIC PANEL
BUN: 11 mg/dL (ref 6–23)
CO2: 25 mEq/L (ref 19–32)
Calcium: 9.7 mg/dL (ref 8.4–10.5)
Chloride: 101 mEq/L (ref 96–112)
Creatinine, Ser: 0.71 mg/dL (ref 0.50–1.10)
GFR calc Af Amer: >90 mL/min (ref >90)
GFR calc non Af Amer: >90 mL/min (ref >90)
Glucose, Bld: 86 mg/dL (ref 70–99)
Potassium: 3.6 mEq/L (ref 3.5–5.1)
Sodium: 135 mEq/L (ref 135–145)

## 2011-12-16 LAB — POCT PREGNANCY, URINE: Preg Test, Ur: NEGATIVE

## 2011-12-16 LAB — POCT I-STAT TROPONIN I: Troponin i, poc: 0 ng/mL (ref 0.00–0.08)

## 2011-12-16 NOTE — ED Notes (Signed)
Pt c/o shortness of breath x 2 days.  Pt reports nausea x 1 week. Pt c/o CP w/ hx of angina and hx of anxiety. Pt has been coughing a small amount. Pt c/o "bumps" that are sore, reports multiple locations. Pt c/o medial upper chest pain that is a sharp stabbing pain.

## 2011-12-17 ENCOUNTER — Emergency Department (HOSPITAL_COMMUNITY): Payer: Medicaid Other

## 2011-12-17 MED ORDER — LORAZEPAM 1 MG PO TABS
1.0000 mg | ORAL_TABLET | Freq: Once | ORAL | Status: AC
  Administered 2011-12-17: 1 mg via ORAL
  Filled 2011-12-17: qty 1

## 2011-12-17 NOTE — Discharge Instructions (Signed)

## 2011-12-17 NOTE — ED Provider Notes (Signed)
History     CSN: [id number]  Arrival date & time 12/16/11  2137   First MD Initiated Contact with Patient 12/17/11 0216      Chief Complaint  Patient presents with  . Shortness of Breath  . Chest Pain    (Consider location/radiation/quality/duration/timing/severity/associated sxs/prior treatment) HPI  Patient presents to the emergency department with complaints of shortness of breath with hives and anxiety for one day. The patient states that she feels bumps everywhere are itchy may make her very anxious. She states that she gets anxious she starts having difficulty breathing her stomach for pain in her chest against tight. Patient has a history of chest pain with a normal CT angiogram heart and 2010 has a extensive psychiatric history depression, suicidal attempt, borderline personality disorder, severe anxiety. She's treated for these with gabapentin, Adderall.  Past Medical History  Diagnosis Date  . Depression   . GERD (gastroesophageal reflux disease)   . Borderline personality disorder     Past Surgical History  Procedure Date  . Abdominal hysterectomy     Family History  Problem Relation Age of Onset  . Heart attack Mother     History  Substance Use Topics  . Smoking status: Never Smoker   . Smokeless tobacco: Not on file  . Alcohol Use: No    OB History    Grav Para Term Preterm Abortions TAB SAB Ect Mult Living                  Review of Systems  All other systems reviewed and are negative.    Allergies  Amoxicillin; Imitrex; and Penicillins  Home Medications   Current Outpatient Rx  Name Route Sig Dispense Refill  . ALBUTEROL SULFATE HFA 108 (90 BASE) MCG/ACT IN AERS Inhalation Inhale 2 puffs into the lungs every 6 (six) hours as needed. SHORTNESS OF BREATH    . AMPHETAMINE-DEXTROAMPHETAMINE 10 MG PO TABS Oral Take 5 mg by mouth 2 (two) times daily.    Marland Kitchen CETIRIZINE HCL 10 MG PO TABS Oral Take 10 mg by mouth daily.    . CYCLOBENZAPRINE HCL 5  MG PO TABS Oral Take 5 mg by mouth at bedtime as needed. Muscle spasms.    Marland Kitchen GABAPENTIN 300 MG PO CAPS Oral Take 300 mg by mouth daily.     . IBUPROFEN 200 MG PO TABS Oral Take 800 mg by mouth every 6 (six) hours as needed.    Marland Kitchen NITROGLYCERIN 0.4 MG SL SUBL Sublingual Place 0.4 mg under the tongue every 5 (five) minutes as needed. CHEST PAIN    . OMEPRAZOLE 20 MG PO CPDR Oral Take 20 mg by mouth daily.      BP 115/73  Pulse 77  Temp 97.8 F (36.6 C) (Oral)  Resp 18  SpO2 100%  Physical Exam  Nursing note and vitals reviewed. Constitutional: She appears well-developed and well-nourished. No distress.  HENT:  Head: Normocephalic and atraumatic.  Eyes: Pupils are equal, round, and reactive to light.  Neck: Normal range of motion. Neck supple.  Cardiovascular: Normal rate and regular rhythm.   Pulmonary/Chest: Effort normal. No respiratory distress. She has no wheezes. She has no rales. She exhibits no tenderness.  Abdominal: Soft.  Neurological: She is alert.  Skin: Skin is warm and dry.  Psychiatric: Her mood appears anxious. She expresses no homicidal and no suicidal ideation. She expresses no suicidal plans and no homicidal plans.    ED Course  Procedures (including critical care time)  Labs Reviewed  CBC  BASIC METABOLIC PANEL  POCT PREGNANCY, URINE  POCT I-STAT TROPONIN I   Dg Chest 2 View  12/17/2011  *RADIOLOGY REPORT*  Clinical Data: 31 year old female with shortness of breath chest pain.  CHEST - 2 VIEW  Comparison: 07/02/2011 and earlier.  Findings: Continued somewhat low lung volumes.  Minimal retrocardiac opacity most resembles atelectasis.  No pneumothorax, pulmonary edema or pleural effusion.  No other pulmonary opacity. Cardiac size and mediastinal contours are within normal limits. Visualized tracheal air column is within normal limits.  No acute osseous abnormality identified.  IMPRESSION: Atelectasis, otherwise no acute cardiopulmonary abnormality.   Original  Report Authenticated By: Harley Hallmark, M.D.      1. Anxiety   2. Hives       MDM   Date: 12/17/2011  Rate: 78  Rhythm: normal sinus rhythm  QRS Axis: normal  Intervals: normal  ST/T Wave abnormalities: normal  Conduction Disutrbances:none  Narrative Interpretation:   Old EKG Reviewed: none available   Clinical Data: Chest pain  MYOCARDIAL IMAGING WITH SPECT (REST AND EXERCISE)  GATED LEFT VENTRICULAR WALL MOTION STUDY  LEFT VENTRICULAR EJECTION FRACTION  Technique: Standard myocardial SPECT imaging was performed after  resting intravenous injection of 10 mCi Tc-7m tetrofosmin.  Subsequently, exercise tolerance test was performed by the patient  under the supervision of the Cardiology staff. At peak exercise,  30 mCi Tc-37m tetrofosmin was injected intravenously and standard  myocardial SPECT imaging was performed. Quantitative gated imaging  was also performed to evaluate left ventricular wall motion and  estimate left ventricular ejection fraction.  Comparison: None.  Findings:  Spect: No inducible or reversible ischemia with exercise stress.  No fixed defects. Target heart rate 162. Achieved heart 171.  Time on treadmill 9 minutes. Stage III completed.  Wall motion: Normal wall motion and thickening  Ejection fraction: Calculated ejection fraction is 68%.  IMPRESSION:  Negative for inducible ischemia with exercise stress  68% ejection fraction  Provider: [place] Reece, Hannah Beat, Thompson Stedge      Patients symptoms consistent with anxiety attack. She has hives. After being observed in the ER for multiple hours the patient continues to remain symptom free. She admits that she was just anxious and is requesting to leave as she is tired and feels fine. Since the patient had a relatively normal CT angio of the heart in 2010 and a significant hx of anxiety with multiple visits, I will let the patient go home at her request and she has been given strict return to ED  precautions with follow up with cardiology.  Pt has been advised of the symptoms that warrant their return to the ED. Patient has voiced understanding and has agreed to follow-up with the PCP or specialist.          Dorthula Matas, PA 12/17/11 [phone]

## 2011-12-21 NOTE — ED Provider Notes (Signed)
Medical screening examination/treatment/procedure(s) were conducted as a shared visit with non-physician practitioner(s) and myself.  I personally evaluated the patient during the encounter.  Low risk for acute coronary syndrome or pulmonary embolus. Screening tests negative  Donnetta Hutching, MD 12/21/11 2049

## 2012-01-19 ENCOUNTER — Emergency Department (HOSPITAL_COMMUNITY): Payer: Medicaid Other

## 2012-01-19 ENCOUNTER — Encounter (HOSPITAL_COMMUNITY): Payer: Self-pay | Admitting: Emergency Medicine

## 2012-01-19 ENCOUNTER — Inpatient Hospital Stay (HOSPITAL_COMMUNITY)
Admission: EM | Admit: 2012-01-19 | Discharge: 2012-01-22 | DRG: 871 | Disposition: A | Discharge status: Home / Self Care | Payer: Medicaid Other | Attending: Internal Medicine | Admitting: Internal Medicine

## 2012-01-19 ENCOUNTER — Inpatient Hospital Stay (HOSPITAL_COMMUNITY): Payer: Medicaid Other

## 2012-01-19 DIAGNOSIS — R51 Headache: Secondary | ICD-10-CM

## 2012-01-19 DIAGNOSIS — A419 Sepsis, unspecified organism: Secondary | ICD-10-CM | POA: Diagnosis present

## 2012-01-19 DIAGNOSIS — K219 Gastro-esophageal reflux disease without esophagitis: Secondary | ICD-10-CM | POA: Diagnosis present

## 2012-01-19 DIAGNOSIS — G039 Meningitis, unspecified: Secondary | ICD-10-CM | POA: Diagnosis present

## 2012-01-19 DIAGNOSIS — R519 Headache, unspecified: Secondary | ICD-10-CM | POA: Diagnosis present

## 2012-01-19 DIAGNOSIS — N390 Urinary tract infection, site not specified: Secondary | ICD-10-CM | POA: Diagnosis present

## 2012-01-19 DIAGNOSIS — F329 Major depressive disorder, single episode, unspecified: Secondary | ICD-10-CM | POA: Diagnosis present

## 2012-01-19 DIAGNOSIS — G935 Compression of brain: Secondary | ICD-10-CM | POA: Diagnosis present

## 2012-01-19 DIAGNOSIS — Z79899 Other long term (current) drug therapy: Secondary | ICD-10-CM

## 2012-01-19 DIAGNOSIS — I472 Ventricular tachycardia, unspecified: Secondary | ICD-10-CM | POA: Diagnosis present

## 2012-01-19 DIAGNOSIS — I498 Other specified cardiac arrhythmias: Secondary | ICD-10-CM | POA: Diagnosis present

## 2012-01-19 DIAGNOSIS — R7881 Bacteremia: Secondary | ICD-10-CM | POA: Diagnosis present

## 2012-01-19 DIAGNOSIS — F411 Generalized anxiety disorder: Secondary | ICD-10-CM | POA: Diagnosis present

## 2012-01-19 DIAGNOSIS — A4151 Sepsis due to Escherichia coli [E. coli]: Principal | ICD-10-CM | POA: Diagnosis present

## 2012-01-19 DIAGNOSIS — G936 Cerebral edema: Secondary | ICD-10-CM | POA: Diagnosis present

## 2012-01-19 DIAGNOSIS — F3289 Other specified depressive episodes: Secondary | ICD-10-CM | POA: Diagnosis present

## 2012-01-19 DIAGNOSIS — I471 Supraventricular tachycardia: Secondary | ICD-10-CM | POA: Diagnosis present

## 2012-01-19 DIAGNOSIS — I4729 Other ventricular tachycardia: Secondary | ICD-10-CM | POA: Diagnosis present

## 2012-01-19 HISTORY — DX: Headache: R51

## 2012-01-19 LAB — URINE MICROSCOPIC-ADD ON

## 2012-01-19 LAB — ACETAMINOPHEN LEVEL: Acetaminophen (Tylenol), Serum: <15 ug/mL (ref 10–30)

## 2012-01-19 LAB — CBC WITH DIFFERENTIAL/PLATELET
Basophils Absolute: 0 10*3/uL (ref 0.0–0.1)
Basophils Relative: 0 % (ref 0–1)
Eosinophils Absolute: 0 10*3/uL (ref 0.0–0.7)
Eosinophils Relative: 0 % (ref 0–5)
HCT: 34.7 % — ABNORMAL LOW (ref 36.0–46.0)
Hemoglobin: 11.6 g/dL — ABNORMAL LOW (ref 12.0–15.0)
Lymphocytes Relative: 9 % — ABNORMAL LOW (ref 12–46)
Lymphs Abs: 0.9 10*3/uL (ref 0.7–4.0)
MCH: 28.9 pg (ref 26.0–34.0)
MCHC: 33.4 g/dL (ref 30.0–36.0)
MCV: 86.3 fL (ref 78.0–100.0)
Monocytes Absolute: 0.2 10*3/uL (ref 0.1–1.0)
Monocytes Relative: 2 % — ABNORMAL LOW (ref 3–12)
Neutro Abs: 9.1 10*3/uL — ABNORMAL HIGH (ref 1.7–7.7)
Neutrophils Relative %: 89 % — ABNORMAL HIGH (ref 43–77)
Platelets: 204 10*3/uL (ref 150–400)
RBC: 4.02 MIL/uL (ref 3.87–5.11)
RDW: 12.8 % (ref 11.5–15.5)
WBC: 10.2 10*3/uL (ref 4.0–10.5)

## 2012-01-19 LAB — URINALYSIS, ROUTINE W REFLEX MICROSCOPIC
Bilirubin Urine: NEGATIVE
Glucose, UA: NEGATIVE mg/dL
Ketones, ur: NEGATIVE mg/dL
Nitrite: POSITIVE — AB
Protein, ur: 100 mg/dL — AB
Specific Gravity, Urine: 1.016 (ref 1.005–1.030)
Urobilinogen, UA: 1 mg/dL (ref 0.0–1.0)
pH: 6 (ref 5.0–8.0)

## 2012-01-19 LAB — RAPID URINE DRUG SCREEN, HOSP PERFORMED
Amphetamines: NOT DETECTED
Barbiturates: NOT DETECTED
Benzodiazepines: NOT DETECTED
Cocaine: NOT DETECTED
Opiates: NOT DETECTED
Tetrahydrocannabinol: NOT DETECTED

## 2012-01-19 LAB — GRAM STAIN

## 2012-01-19 LAB — BASIC METABOLIC PANEL
BUN: 11 mg/dL (ref 6–23)
CO2: 21 mEq/L (ref 19–32)
Calcium: 8.9 mg/dL (ref 8.4–10.5)
Chloride: 103 mEq/L (ref 96–112)
Creatinine, Ser: 1.15 mg/dL — ABNORMAL HIGH (ref 0.50–1.10)
GFR calc Af Amer: 72 mL/min — ABNORMAL LOW (ref >90)
GFR calc non Af Amer: 62 mL/min — ABNORMAL LOW (ref >90)
Glucose, Bld: 146 mg/dL — ABNORMAL HIGH (ref 70–99)
Potassium: 3.6 mEq/L (ref 3.5–5.1)
Sodium: 135 mEq/L (ref 135–145)

## 2012-01-19 LAB — SALICYLATE LEVEL: Salicylate Lvl: <2 mg/dL — ABNORMAL LOW (ref 2.8–20.0)

## 2012-01-19 LAB — POCT PREGNANCY, URINE: Preg Test, Ur: NEGATIVE

## 2012-01-19 MED ORDER — IBUPROFEN 400 MG PO TABS
600.0000 mg | ORAL_TABLET | Freq: Once | ORAL | Status: AC
  Administered 2012-01-19: 600 mg via ORAL
  Filled 2012-01-19: qty 2

## 2012-01-19 MED ORDER — VANCOMYCIN HCL 1000 MG IV SOLR
1500.0000 mg | Freq: Once | INTRAVENOUS | Status: AC
  Administered 2012-01-19: 1500 mg via INTRAVENOUS
  Filled 2012-01-19: qty 1500

## 2012-01-19 MED ORDER — SODIUM CHLORIDE 0.9 % IV BOLUS (SEPSIS)
1000.0000 mL | Freq: Once | INTRAVENOUS | Status: AC
  Administered 2012-01-19: 1000 mL via INTRAVENOUS

## 2012-01-19 MED ORDER — DEXTROSE 5 % IV SOLN
2.0000 g | Freq: Once | INTRAVENOUS | Status: AC
  Administered 2012-01-19: 2 g via INTRAVENOUS
  Filled 2012-01-19: qty 2

## 2012-01-19 MED ORDER — ACETAMINOPHEN 650 MG RE SUPP: 650.0000 mg | Freq: Once | RECTAL | Status: DC

## 2012-01-19 MED ORDER — SODIUM CHLORIDE 0.9 % IV SOLN
10.0000 mg | Freq: Once | INTRAVENOUS | Status: DC
  Filled 2012-01-19: qty 1

## 2012-01-19 MED ORDER — DEXTROSE 5 % IV SOLN
700.0000 mg | Freq: Once | INTRAVENOUS | Status: AC
  Administered 2012-01-19: 700 mg via INTRAVENOUS
  Filled 2012-01-19: qty 14

## 2012-01-19 NOTE — ED Provider Notes (Signed)
History     CSN: [id number]  Arrival date & time 01/19/12  1648   First MD Initiated Contact with Patient 01/19/12 1655      Chief Complaint  Patient presents with  . Anxiety  . Tachycardia    (Consider location/radiation/quality/duration/timing/severity/associated sxs/prior treatment) Patient is a 31 y.o. female presenting with anxiety.  Anxiety This is a recurrent problem. The current episode started today. The problem occurs constantly. The problem has been gradually improving. Associated symptoms include chest pain (resolved) and diaphoresis. Pertinent negatives include no abdominal pain, anorexia, arthralgias, change in bowel habit, chills, congestion, coughing, fatigue, fever, headaches, joint swelling, myalgias, nausea, neck pain, numbness, rash, sore throat, swollen glands, urinary symptoms, vertigo, visual change, vomiting or weakness. Treatments tried: ASA, Adenosine 12 mg adminstereed by EMS. The treatment provided mild relief.   31 yo F with hx of anxiety and depression presents via EMS for AMS, fever, and tachycardia. Per EMS report patient was in SVT which resolved with 12 of adenosine. History limited given AMS. Patient febrile on arrival.  She reports dysuria, fever and HA since yesterday. HA worsened with movement at neck and lights.   Past Medical History  Diagnosis Date  . Depression   . GERD (gastroesophageal reflux disease)   . Borderline personality disorder     Past Surgical History  Procedure Date  . Abdominal hysterectomy     Family History  Problem Relation Age of Onset  . Heart attack Mother     History  Substance Use Topics  . Smoking status: Never Smoker   . Smokeless tobacco: Not on file  . Alcohol Use: No    OB History    Grav Para Term Preterm Abortions TAB SAB Ect Mult Living                  Review of Systems  Constitutional: Positive for diaphoresis. Negative for fever, chills, activity change, appetite change and fatigue.    HENT: Negative for ear pain, congestion, sore throat, rhinorrhea and neck pain.   Eyes: Negative for pain.  Respiratory: Negative for cough and shortness of breath.   Cardiovascular: Positive for chest pain (resolved) and palpitations. Negative for leg swelling.  Gastrointestinal: Negative for nausea, vomiting, abdominal pain, anorexia and change in bowel habit.  Genitourinary: Negative for dysuria, difficulty urinating and pelvic pain.  Musculoskeletal: Negative for myalgias, back pain, joint swelling and arthralgias.  Skin: Negative for rash and wound.  Neurological: Negative for vertigo, weakness, numbness and headaches.  Psychiatric/Behavioral: Negative for behavioral problems, confusion and agitation.    Allergies  Amoxicillin; Imitrex; and Penicillins  Home Medications   Current Outpatient Rx  Name Route Sig Dispense Refill  . ALBUTEROL SULFATE HFA 108 (90 BASE) MCG/ACT IN AERS Inhalation Inhale 2 puffs into the lungs every 6 (six) hours as needed. SHORTNESS OF BREATH    . AMPHETAMINE-DEXTROAMPHETAMINE 10 MG PO TABS Oral Take 5 mg by mouth 2 (two) times daily.    Marland Kitchen CETIRIZINE HCL 10 MG PO TABS Oral Take 10 mg by mouth daily.    . CYCLOBENZAPRINE HCL 5 MG PO TABS Oral Take 5 mg by mouth at bedtime as needed. Muscle spasms.    Marland Kitchen GABAPENTIN 300 MG PO CAPS Oral Take 300 mg by mouth daily.     . IBUPROFEN 200 MG PO TABS Oral Take 800 mg by mouth every 6 (six) hours as needed.    Marland Kitchen NITROGLYCERIN 0.4 MG SL SUBL Sublingual Place 0.4 mg under the tongue every  5 (five) minutes as needed. CHEST PAIN    . OMEPRAZOLE 20 MG PO CPDR Oral Take 20 mg by mouth daily.      There were no vitals taken for this visit.  Physical Exam  ED Course  Procedures (including critical care time)    Results for orders placed during the hospital encounter of 01/19/12  CBC WITH DIFFERENTIAL      Component Value Range   WBC 10.2  4.0 - 10.5 K/uL   RBC 4.02  3.87 - 5.11 MIL/uL   Hemoglobin 11.6 (*) 12.0  - 15.0 g/dL   HCT 16.1 (*) 09.6 - 04.5 %   MCV 86.3  78.0 - 100.0 fL   MCH 28.9  26.0 - 34.0 pg   MCHC 33.4  30.0 - 36.0 g/dL   RDW 40.9  81.1 - 91.4 %   Platelets 204  150 - 400 K/uL   Neutrophils Relative 89 (*) 43 - 77 %   Neutro Abs 9.1 (*) 1.7 - 7.7 K/uL   Lymphocytes Relative 9 (*) 12 - 46 %   Lymphs Abs 0.9  0.7 - 4.0 K/uL   Monocytes Relative 2 (*) 3 - 12 %   Monocytes Absolute 0.2  0.1 - 1.0 K/uL   Eosinophils Relative 0  0 - 5 %   Eosinophils Absolute 0.0  0.0 - 0.7 K/uL   Basophils Relative 0  0 - 1 %   Basophils Absolute 0.0  0.0 - 0.1 K/uL  BASIC METABOLIC PANEL      Component Value Range   Sodium 135  135 - 145 mEq/L   Potassium 3.6  3.5 - 5.1 mEq/L   Chloride 103  96 - 112 mEq/L   CO2 21  19 - 32 mEq/L   Glucose, Bld 146 (*) 70 - 99 mg/dL   BUN 11  6 - 23 mg/dL   Creatinine, Ser 7.82 (*) 0.50 - 1.10 mg/dL   Calcium 8.9  8.4 - 95.6 mg/dL   GFR calc non Af Amer 62 (*) >90 mL/min   GFR calc Af Amer 72 (*) >90 mL/min  URINE RAPID DRUG SCREEN (HOSP PERFORMED)      Component Value Range   Opiates NONE DETECTED  NONE DETECTED   Cocaine NONE DETECTED  NONE DETECTED   Benzodiazepines NONE DETECTED  NONE DETECTED   Amphetamines NONE DETECTED  NONE DETECTED   Tetrahydrocannabinol NONE DETECTED  NONE DETECTED   Barbiturates NONE DETECTED  NONE DETECTED  URINALYSIS, ROUTINE W REFLEX MICROSCOPIC      Component Value Range   Color, Urine YELLOW  YELLOW   APPearance CLOUDY (*) CLEAR   Specific Gravity, Urine 1.016  1.005 - 1.030   pH 6.0  5.0 - 8.0   Glucose, UA NEGATIVE  NEGATIVE mg/dL   Hgb urine dipstick MODERATE (*) NEGATIVE   Bilirubin Urine NEGATIVE  NEGATIVE   Ketones, ur NEGATIVE  NEGATIVE mg/dL   Protein, ur 213 (*) NEGATIVE mg/dL   Urobilinogen, UA 1.0  0.0 - 1.0 mg/dL   Nitrite POSITIVE (*) NEGATIVE   Leukocytes, UA LARGE (*) NEGATIVE  GRAM STAIN      Component Value Range   Specimen Description URINE, CLEAN CATCH     Special Requests NONE     Gram  Stain       Value: CYTOSPIN SLIDE     EPITHELIAL CELLS PRESENT     WBC PRESENT, PREDOMINANTLY PMN     GRAM NEGATIVE RODS   Report Status 01/19/2012  FINAL    SALICYLATE LEVEL      Component Value Range   Salicylate Lvl <2.0 (*) 2.8 - 20.0 mg/dL  ACETAMINOPHEN LEVEL      Component Value Range   Acetaminophen (Tylenol), Serum <15.0  10 - 30 ug/mL  POCT PREGNANCY, URINE      Component Value Range   Preg Test, Ur NEGATIVE  NEGATIVE  URINE MICROSCOPIC-ADD ON      Component Value Range   Squamous Epithelial / LPF RARE  RARE   WBC, UA 7-10  <3 WBC/hpf   RBC / HPF 0-2  <3 RBC/hpf   Bacteria, UA MANY (*) RARE       CT Head Wo Contrast (Final result)   Result time:01/19/12 1935    Final result by Rad Results In Interface (01/19/12 19:35:41)    Narrative:   *RADIOLOGY REPORT*  Clinical Data: Decreased level of consciousness  CT HEAD WITHOUT CONTRAST  Technique: Contiguous axial images were obtained from the base of the skull through the vertex without contrast.  Comparison: None.  Findings: The basal cisterns are effaced (series 2/image 11). This appearance is suspicious for cerebral edema with increased intracranial pressure/impending transtentorial herniation.  The falx and tentorium are hyperdense, which could reflect subarachnoid hemorrhage, but is favored to reflect "pseudosubarachnoid hemorrhage" given lack of focal abnormality as well as the above findings.  The visualized paranasal sinuses are essentially clear. The mastoid air cells are unopacified.  No evidence of calvarial fracture.  IMPRESSION: Suspected diffuse cerebral edema.  Effacement of the basal cisterns, suggesting increased intracranial pressure/impending transtentorial herniation.  Hyperdensity in the falx and tentorium, raising the possibility of subarachnoid hemorrhage, although favored to reflect "pseudosubarachnoid hemorrhage" given the above findings.  Critical Value/emergent results were  called by telephone at the time of interpretation on 01/19/2012 at 1928 hours to Dr Gerhard Munch, who verbally acknowledged these results.   Original Report Authenticated By: Charline Bills, M.D.             [facility] Chest Port 1 View (Final result)   Result time:01/19/12 [phone]    Final result by Rad Results In Interface (01/19/12 18:24:41)    Narrative:   *RADIOLOGY REPORT*  Clinical Data: Altered mental status.  PORTABLE CHEST - 1 VIEW  Comparison: 12/17/2011.  Findings: Trachea is midline. Heart size normal. Lungs are low in volume with bibasilar air space disease, right greater than left. No pleural fluid.  IMPRESSION: Low lung volumes with bibasilar air space disease, right greater than left. In this patient with a reported history of altered mental status, aspiration could have this appearance. Atelectasis and pneumonia are additional considerations.         1. Meningitis   2. Headache       MDM      31 yo F febrile, tachycardic who presents via EMS for AMS and HA.  CT with "Suspected diffuse cerebral edema. Effacement of the basal cisterns, suggesting increased intracranial pressure/impending transtentorial herniation. Hyperdensity in the falx and tentorium, raising the possibility of subarachnoid hemorrhage, although favored to reflect "pseudosubarachnoid hemorrhage" given the above findings."   7:45 PM Discussed case including CT head findings with Dr. Shirlean Kelly (Neurosurgery) who did not feel surgical intervention was warranted at this time.    Discussed case with Dr. Thad Ranger (Neurology). See her note for further details.   Blood cultures drawn prior to antibiotics. Coverage for meningitis.   UA with UTI.   Case discussed with Hospitalist service. Patient admitted for further care.  Nadara Mustard, MD 01/20/12 [phone]

## 2012-01-19 NOTE — ED Notes (Signed)
Pt to ED from home via EMS, hyperventilating on EMS arrival, no CP initially, developed CP which was resolved, first 12 lead normal,  Subsequent HR 200 - EMS gave 12mg  adenison, HR 130 after meds, 99% on room air, 132/60, is diaphoretic on arrival, denies CP on arrival, c/o HA, no SOB

## 2012-01-19 NOTE — ED Notes (Signed)
Neurology to bedside

## 2012-01-19 NOTE — ED Notes (Signed)
Family at bedside. Updated by Dr Jeraldine Loots.

## 2012-01-19 NOTE — Consult Note (Addendum)
Reason for Consult:Abnormal head CT Referring Physician: Jeraldine Loots  CC: Headache, fever and lethargy  HPI: [name] is an 31 y.o. female who by report of her husband has been feeling poorly for the past 2-3 days.  She has complained of frontal pressure and had coughing.  Last evening had at least three times when she was febrile (fever101-102).  Today as more lethargic an complained of worsening headache.  Was brought in for further evaluation.  Head Ct was performed in the ED and showed diffuse cerebral edema.  There was question of SAH as well.  Consult was called for further evaluation. Complains of headache that is associated with dizziness.  Reports that it is frontal and a pressure-like sensation.  There is no associated visual complaints, nausea or vomiting.  There has been no diarrhea.  She rates the pain at a 10/10.    Past Medical History  Diagnosis Date  . Depression   . GERD (gastroesophageal reflux disease)   . Borderline personality disorder     Past Surgical History  Procedure Date  . Abdominal hysterectomy     Family History  Problem Relation Age of Onset  . Heart attack Mother     Social History:  reports that she has never smoked. She does not have any smokeless tobacco history on file. She reports that she does not drink alcohol or use illicit drugs. She does not work.  She is married.    Allergies  Allergen Reactions  . Amoxicillin     Unknown  . Imitrex (Sumatriptan Base)     Unknown  . Penicillins     Unknown    Medications: I have reviewed the patient's current medications. Prior to Admission:  Current outpatient prescriptions:albuterol (PROVENTIL HFA;VENTOLIN HFA) 108 (90 BASE) MCG/ACT inhaler, Inhale 2 puffs into the lungs every 6 (six) hours as needed. SHORTNESS OF BREATH, Disp: , Rfl: ;  amphetamine-dextroamphetamine (ADDERALL) 10 MG tablet, Take 5 mg by mouth 2 (two) times daily., Disp: , Rfl: ;  cetirizine (ZYRTEC) 10 MG tablet, Take 10 mg  by mouth daily., Disp: , Rfl:  cyclobenzaprine (FLEXERIL) 5 MG tablet, Take 5 mg by mouth at bedtime as needed. Muscle spasms., Disp: , Rfl: ;  gabapentin (NEURONTIN) 300 MG capsule, Take 300 mg by mouth daily. , Disp: , Rfl: ;  ibuprofen (ADVIL,MOTRIN) 200 MG tablet, Take 800 mg by mouth every 6 (six) hours as needed. For pain, Disp: , Rfl: ;  nitroGLYCERIN (NITROSTAT) 0.4 MG SL tablet, Place 0.4 mg under the tongue every 5 (five) minutes as needed. CHEST PAIN, Disp: , Rfl:  omeprazole (PRILOSEC) 20 MG capsule, Take 20 mg by mouth daily., Disp: , Rfl:   ROS: History obtained from the patient  General ROS: as per HPI Psychological ROS: negative for - behavioral disorder, hallucinations, memory difficulties, mood swings or suicidal ideation Ophthalmic ROS: negative for - blurry vision, double vision, eye pain or loss of vision ENT ROS: negative for - epistaxis, nasal discharge, oral lesions, sore throat, tinnitus or vertigo Allergy and Immunology ROS: negative for - hives or itchy/watery eyes Hematological and Lymphatic ROS: negative for - bleeding problems, bruising or swollen lymph nodes Endocrine ROS: negative for - galactorrhea, hair pattern changes, polydipsia/polyuria or temperature intolerance Respiratory ROS: as per HPI Cardiovascular ROS: negative for - chest pain, dyspnea on exertion, edema or irregular heartbeat Gastrointestinal ROS: negative for - abdominal pain, diarrhea, hematemesis, nausea/vomiting or stool incontinence Genito-Urinary ROS: negative for - dysuria, hematuria, incontinence or urinary  frequency/urgency Musculoskeletal ROS: negative for - joint swelling or muscular weakness Neurological ROS: as noted in HPI Dermatological ROS: negative for rash and skin lesion changes  Physical Examination: Blood pressure 99/61, pulse 101, temperature 98.4 F (36.9 C), temperature source Oral, resp. rate 20, SpO2 100.00%.  Neurologic Examination Mental Status: Patient lethargic  but easily alerted.  Oriented.  Speech fluent without evidence of aphasia.  Able to follow 3 step commands without difficulty. Cranial Nerves: II: Discs flat on the right and difficult to visualize on the left secondary to cooperation.  Visual fields grossly normal, pupils equal, round, reactive to light and accommodation III,IV, VI: ptosis not present, extra-ocular motions intact bilaterally V,VII: smile symmetric, facial light touch sensation normal bilaterally VIII: hearing normal bilaterally IX,X: gag reflex present XI: bilateral shoulder shrug XII: midline tongue extension Motor: Right : Upper extremity   5/5    Left:     Upper extremity   5/5  Lower extremity   5/5     Lower extremity   5/5 Minimal effort given during exam.  Tone and bulk:normal tone throughout; no atrophy noted Sensory: Pinprick and light touch intact throughout, bilaterally Deep Tendon Reflexes: 2+ and symmetric with absent AJ's bilaterally Plantars: Right: downgoing   Left: downgoing Cerebellar: normal finger-to-nose and normal heel-to-shin test Gait: unable to test CV: pulses palpable throughout    Laboratory Studies:   Basic Metabolic Panel:  Lab 01/19/12 9562  NA 135  K 3.6  CL 103  CO2 21  GLUCOSE 146*  BUN 11  CREATININE 1.15*  CALCIUM 8.9  MG --  PHOS --    Liver Function Tests: No results found for this basename: AST:5,ALT:5,ALKPHOS:5,BILITOT:5,PROT:5,ALBUMIN:5 in the last 168 hours No results found for this basename: LIPASE:5,AMYLASE:5 in the last 168 hours No results found for this basename: AMMONIA:3 in the last 168 hours  CBC:  Lab 01/19/12 1716  WBC 10.2  NEUTROABS 9.1*  HGB 11.6*  HCT 34.7*  MCV 86.3  PLT 204    Cardiac Enzymes: No results found for this basename: CKTOTAL:5,CKMB:5,CKMBINDEX:5,TROPONINI:5 in the last 168 hours  BNP: No components found with this basename: POCBNP:5  CBG: No results found for this basename: GLUCAP:5 in the last 168  hours  Microbiology: Results for orders placed during the hospital encounter of 01/19/12  GRAM STAIN     Status: Normal   Collection Time   01/19/12  6:22 PM      Component Value Range Status Comment   Specimen Description URINE, CLEAN CATCH   Final    Special Requests NONE   Final    Gram Stain     Final    Value: CYTOSPIN SLIDE     EPITHELIAL CELLS PRESENT     WBC PRESENT, PREDOMINANTLY PMN     GRAM NEGATIVE RODS   Report Status 01/19/2012 FINAL   Final     Coagulation Studies: No results found for this basename: LABPROT:5,INR:5 in the last 72 hours  Urinalysis:  Lab 01/19/12 1822  COLORURINE YELLOW  LABSPEC 1.016  PHURINE 6.0  GLUCOSEU NEGATIVE  HGBUR MODERATE*  BILIRUBINUR NEGATIVE  KETONESUR NEGATIVE  PROTEINUR 100*  UROBILINOGEN 1.0  NITRITE POSITIVE*  LEUKOCYTESUR LARGE*    Lipid Panel:     Component Value Date/Time   CHOL  Value: 132        ATP III CLASSIFICATION:  <200     mg/dL   Desirable  130-865  mg/dL   Borderline High  >=784    mg/dL  High        09/29/2008 0910   TRIG 35 09/29/2008 0910   HDL 43 09/29/2008 0910   CHOLHDL 3.1 09/29/2008 0910   VLDL 7 09/29/2008 0910   LDLCALC  Value: 82        Total Cholesterol/HDL:CHD Risk Coronary Heart Disease Risk Table                     Men   Women  1/2 Average Risk   3.4   3.3  Average Risk       5.0   4.4  2 X Average Risk   9.6   7.1  3 X Average Risk  23.4   11.0        Use the calculated Patient Ratio above and the CHD Risk Table to determine the patient's CHD Risk.        ATP III CLASSIFICATION (LDL):  <100     mg/dL   Optimal  161-096  mg/dL   Near or Above                    Optimal  130-159  mg/dL   Borderline  045-409  mg/dL   High  >811     mg/dL   Very High 12/23/4780 9562    HgbA1C:  No results found for this basename: HGBA1C    Urine Drug Screen:     Component Value Date/Time   LABOPIA NONE DETECTED 01/19/2012 1822   COCAINSCRNUR NONE DETECTED 01/19/2012 1822   LABBENZ NONE DETECTED 01/19/2012 1822    AMPHETMU NONE DETECTED 01/19/2012 1822   THCU NONE DETECTED 01/19/2012 1822   LABBARB NONE DETECTED 01/19/2012 1822    Alcohol Level: No results found for this basename: ETH:2 in the last 168 hours  Imaging: Ct Head Wo Contrast  01/19/2012  *RADIOLOGY REPORT*  Clinical Data: Decreased level of consciousness  CT HEAD WITHOUT CONTRAST  Technique:  Contiguous axial images were obtained from the base of the skull through the vertex without contrast.  Comparison: None.  Findings: The basal cisterns are effaced (series 2/image 11).  This appearance is suspicious for cerebral edema with increased intracranial pressure/impending transtentorial herniation.  The falx and tentorium are hyperdense, which could reflect subarachnoid hemorrhage, but is favored to reflect "pseudosubarachnoid hemorrhage" given lack of focal abnormality as well as the above findings.  The visualized paranasal sinuses are essentially clear. The mastoid air cells are unopacified.  No evidence of calvarial fracture.  IMPRESSION: Suspected diffuse cerebral edema.  Effacement of the basal cisterns, suggesting increased intracranial pressure/impending transtentorial herniation.  Hyperdensity in the falx and tentorium, raising the possibility of subarachnoid hemorrhage, although favored to reflect "pseudosubarachnoid hemorrhage" given the above findings.  Critical Value/emergent results were called by telephone at the time of interpretation on 01/19/2012 at 1928 hours to Dr Gerhard Munch, who verbally acknowledged these results.   Original Report Authenticated By: Charline Bills, M.D.    Dg Chest Port 1 View  01/19/2012  *RADIOLOGY REPORT*  Clinical Data: Altered mental status.  PORTABLE CHEST - 1 VIEW  Comparison: 12/17/2011.  Findings: Trachea is midline.  Heart size normal.  Lungs are low in volume with bibasilar air space disease, right greater than left. No pleural fluid.  IMPRESSION: Low lung volumes with bibasilar air space disease, right  greater than left.  In this patient with a reported history of altered mental status, aspiration could have this appearance.  Atelectasis and pneumonia are additional considerations.  Original Report Authenticated By: Reyes Ivan, M.D.      Assessment/Plan:  30 yo female with recent upper respiratory illness that has now developed fever and lethargy.  Reports headache as well.  No focality noted on exam.  Easily alerted.  Follows commands.  Head CT suggestive of diffuse cerebral edema.  Chest X-ray suggestive of a pneumonia.  With fever, altered mental status and CT findings, meningitis is high on the differential.  Due to finding of diffuse cerebral edema would not consider LP at this time.  Patient likely better served by empiric coverage with broad spectrum antibiotics and steroids.  With mention of possible SAH, further imaging is recommended as well.  BP is controlled.   Neurosurgery does not feel their services are required at this time.    Recommendations: 1.  Would start Rocephin and vancomycin for broad spectrum antibiotic coverage.  2.  Acyclovir to be started for possible viral meningitis coverage 3.  Dexamethasone 10mg  now to be continued at 6mg  q6 4.  Blood cultures 5.  Frequent neuro checks 6.  MRI, MRA of the brain  Case discussed with Dr. Sandre Kitty, MD [facility] [phone] 01/19/2012, 8:57 PM

## 2012-01-19 NOTE — ED Notes (Addendum)
Pt LOC decreased, no verbal response to questions or painful stimuli, no response to IV start, pt, moving, maintaining airway well, has good pulses, pt's husband to bedside - Dr Jeraldine Loots notified

## 2012-01-19 NOTE — ED Notes (Signed)
Patient placed on bp cuff, cardiac monitor, and pulse oximetry.

## 2012-01-19 NOTE — ED Notes (Signed)
Dr. Lockwood to bedside 

## 2012-01-20 ENCOUNTER — Encounter (HOSPITAL_COMMUNITY): Payer: Self-pay | Admitting: *Deleted

## 2012-01-20 ENCOUNTER — Inpatient Hospital Stay (HOSPITAL_COMMUNITY): Payer: Medicaid Other

## 2012-01-20 DIAGNOSIS — A419 Sepsis, unspecified organism: Secondary | ICD-10-CM | POA: Diagnosis present

## 2012-01-20 DIAGNOSIS — G935 Compression of brain: Secondary | ICD-10-CM | POA: Diagnosis present

## 2012-01-20 DIAGNOSIS — N390 Urinary tract infection, site not specified: Secondary | ICD-10-CM

## 2012-01-20 DIAGNOSIS — G936 Cerebral edema: Secondary | ICD-10-CM | POA: Diagnosis present

## 2012-01-20 DIAGNOSIS — R51 Headache: Secondary | ICD-10-CM

## 2012-01-20 HISTORY — DX: Urinary tract infection, site not specified: N39.0

## 2012-01-20 LAB — BASIC METABOLIC PANEL
BUN: 9 mg/dL (ref 6–23)
CO2: 19 mEq/L (ref 19–32)
Calcium: 8.4 mg/dL (ref 8.4–10.5)
Chloride: 111 mEq/L (ref 96–112)
Creatinine, Ser: 0.55 mg/dL (ref 0.50–1.10)
GFR calc Af Amer: >90 mL/min (ref >90)
GFR calc non Af Amer: >90 mL/min (ref >90)
Glucose, Bld: 137 mg/dL — ABNORMAL HIGH (ref 70–99)
Potassium: 3.6 mEq/L (ref 3.5–5.1)
Sodium: 139 mEq/L (ref 135–145)

## 2012-01-20 LAB — CBC
HCT: 34.1 % — ABNORMAL LOW (ref 36.0–46.0)
Hemoglobin: 11.5 g/dL — ABNORMAL LOW (ref 12.0–15.0)
MCH: 29.3 pg (ref 26.0–34.0)
MCHC: 33.7 g/dL (ref 30.0–36.0)
MCV: 87 fL (ref 78.0–100.0)
Platelets: 192 10*3/uL (ref 150–400)
RBC: 3.92 MIL/uL (ref 3.87–5.11)
RDW: 13.3 % (ref 11.5–15.5)
WBC: 9.5 10*3/uL (ref 4.0–10.5)

## 2012-01-20 LAB — MRSA PCR SCREENING: MRSA by PCR: NEGATIVE

## 2012-01-20 MED ORDER — ENOXAPARIN SODIUM 40 MG/0.4ML ~~LOC~~ SOLN
40.0000 mg | SUBCUTANEOUS | Status: DC
  Administered 2012-01-20 – 2012-01-21 (×2): 40 mg via SUBCUTANEOUS
  Filled 2012-01-20 (×3): qty 0.4

## 2012-01-20 MED ORDER — SODIUM CHLORIDE 0.9 % IV SOLN
INTRAVENOUS | Status: DC
  Administered 2012-01-20 (×2): via INTRAVENOUS

## 2012-01-20 MED ORDER — DEXAMETHASONE SODIUM PHOSPHATE 10 MG/ML IJ SOLN
10.0000 mg | Freq: Four times a day (QID) | INTRAMUSCULAR | Status: DC
  Administered 2012-01-20 (×2): 10 mg via INTRAVENOUS
  Filled 2012-01-20 (×8): qty 1

## 2012-01-20 MED ORDER — ONDANSETRON HCL 4 MG PO TABS: 4.0000 mg | ORAL_TABLET | Freq: Four times a day (QID) | ORAL | Status: DC | PRN

## 2012-01-20 MED ORDER — ALUM & MAG HYDROXIDE-SIMETH 200-200-20 MG/5ML PO SUSP: 30.0000 mL | Freq: Four times a day (QID) | ORAL | Status: DC | PRN

## 2012-01-20 MED ORDER — VANCOMYCIN HCL 1000 MG IV SOLR
1250.0000 mg | Freq: Three times a day (TID) | INTRAVENOUS | Status: DC
  Administered 2012-01-20: 1250 mg via INTRAVENOUS
  Filled 2012-01-20 (×3): qty 1250

## 2012-01-20 MED ORDER — GADOBENATE DIMEGLUMINE 529 MG/ML IV SOLN
20.0000 mL | Freq: Once | INTRAVENOUS | Status: AC | PRN
  Administered 2012-01-20: 20 mL via INTRAVENOUS

## 2012-01-20 MED ORDER — DEXTROSE 5 % IV SOLN
700.0000 mg | Freq: Three times a day (TID) | INTRAVENOUS | Status: DC
  Administered 2012-01-20: 700 mg via INTRAVENOUS
  Filled 2012-01-20 (×4): qty 14

## 2012-01-20 MED ORDER — GABAPENTIN 300 MG PO CAPS
300.0000 mg | ORAL_CAPSULE | Freq: Every day | ORAL | Status: DC
  Administered 2012-01-20 – 2012-01-22 (×3): 300 mg via ORAL
  Filled 2012-01-20 (×3): qty 1

## 2012-01-20 MED ORDER — NITROGLYCERIN 0.4 MG SL SUBL: 0.4000 mg | SUBLINGUAL_TABLET | SUBLINGUAL | Status: DC | PRN

## 2012-01-20 MED ORDER — OXYCODONE HCL 5 MG PO TABS
5.0000 mg | ORAL_TABLET | ORAL | Status: DC | PRN
  Administered 2012-01-22 (×3): 5 mg via ORAL
  Filled 2012-01-20 (×3): qty 1

## 2012-01-20 MED ORDER — ACETAMINOPHEN 650 MG RE SUPP: 650.0000 mg | Freq: Four times a day (QID) | RECTAL | Status: DC | PRN

## 2012-01-20 MED ORDER — SODIUM CHLORIDE 0.9 % IV SOLN: INTRAVENOUS | Status: DC

## 2012-01-20 MED ORDER — ACETAMINOPHEN 325 MG PO TABS
650.0000 mg | ORAL_TABLET | Freq: Four times a day (QID) | ORAL | Status: DC | PRN
  Administered 2012-01-20 – 2012-01-22 (×5): 650 mg via ORAL
  Filled 2012-01-20 (×6): qty 2

## 2012-01-20 MED ORDER — PANTOPRAZOLE SODIUM 40 MG PO TBEC
40.0000 mg | DELAYED_RELEASE_TABLET | Freq: Every day | ORAL | Status: DC
  Administered 2012-01-20 – 2012-01-22 (×3): 40 mg via ORAL
  Filled 2012-01-20 (×4): qty 1

## 2012-01-20 MED ORDER — CEFTRIAXONE SODIUM 2 G IJ SOLR: 2.0000 g | INTRAMUSCULAR | Status: DC

## 2012-01-20 MED ORDER — HYDROMORPHONE HCL PF 1 MG/ML IJ SOLN: 0.5000 mg | INTRAMUSCULAR | Status: DC | PRN

## 2012-01-20 MED ORDER — ALBUTEROL SULFATE HFA 108 (90 BASE) MCG/ACT IN AERS: 2.0000 | INHALATION_SPRAY | Freq: Four times a day (QID) | RESPIRATORY_TRACT | Status: DC | PRN

## 2012-01-20 MED ORDER — DEXTROSE 5 % IV SOLN
2.0000 g | Freq: Two times a day (BID) | INTRAVENOUS | Status: DC
  Filled 2012-01-20 (×2): qty 2

## 2012-01-20 MED ORDER — DEXTROSE 5 % IV SOLN
2.0000 g | INTRAVENOUS | Status: DC
  Administered 2012-01-20: 2 g via INTRAVENOUS
  Filled 2012-01-20 (×2): qty 2

## 2012-01-20 MED ORDER — ONDANSETRON HCL 4 MG/2ML IJ SOLN
4.0000 mg | Freq: Four times a day (QID) | INTRAMUSCULAR | Status: DC | PRN
  Administered 2012-01-20: 4 mg via INTRAVENOUS
  Filled 2012-01-20: qty 2

## 2012-01-20 NOTE — H&P (Signed)
[facility] History and Physical  DAMAYA CHANNING ZOX:[id number] DOB: [birth year] DOA: 01/19/2012  Referring physician:  PCP: Dorrene German, MD   Chief Complaint: Headache and Fever  HPI: [name] is a 31 y.o. female who presented to the ED with complaints of a severe worsening frontal area headache for the past 2-3 days the fevers and chills and increasing confusion and listlessness over the past 24 hours.  She was seen and evaluated in the ED an had a CT scan which revealed Diffuse Cerebral Edema and questionable SAH, Neurosurgery was called and the case was discussed, and the case was deemed nonsurgical and then  Neurology was consulted.  She was started on IV Vancomycin, and Rocephin, and Acyclovir and Decadron therapy and referred for admission.     Review of Systems: The patient denies anorexia, weight loss, vision loss, decreased hearing, hoarseness, chest pain, syncope, dyspnea on exertion, peripheral edema, balance deficits, hemoptysis, abdominal pain, melena, hematochezia, severe indigestion/heartburn, hematuria, incontinence, genital sores, muscle weakness, suspicious skin lesions, transient blindness, difficulty walking, depression, unusual weight change, abnormal bleeding, enlarged lymph nodes, angioedema, and breast masses.    Past Medical History  Diagnosis Date  . Depression   . GERD (gastroesophageal reflux disease)   . Borderline personality disorder    Past Surgical History  Procedure Date  . Abdominal hysterectomy     Prior to Admission medications   Medication Sig Start Date End Date Taking? Authorizing Provider  albuterol (PROVENTIL HFA;VENTOLIN HFA) 108 (90 BASE) MCG/ACT inhaler Inhale 2 puffs into the lungs every 6 (six) hours as needed. SHORTNESS OF BREATH   Yes Historical Provider, MD  amphetamine-dextroamphetamine (ADDERALL) 10 MG tablet Take 5 mg by mouth 2 (two) times daily.   Yes Historical Provider, MD  cetirizine (ZYRTEC) 10 MG tablet  Take 10 mg by mouth daily.   Yes Historical Provider, MD  cyclobenzaprine (FLEXERIL) 5 MG tablet Take 5 mg by mouth at bedtime as needed. Muscle spasms.   Yes Historical Provider, MD  gabapentin (NEURONTIN) 300 MG capsule Take 300 mg by mouth daily.    Yes Historical Provider, MD  ibuprofen (ADVIL,MOTRIN) 200 MG tablet Take 800 mg by mouth every 6 (six) hours as needed. For pain   Yes Historical Provider, MD  nitroGLYCERIN (NITROSTAT) 0.4 MG SL tablet Place 0.4 mg under the tongue every 5 (five) minutes as needed. CHEST PAIN   Yes Historical Provider, MD  omeprazole (PRILOSEC) 20 MG capsule Take 20 mg by mouth daily.   Yes Historical Provider, MD     Allergies  Allergen Reactions  . Amoxicillin     Unknown  . Imitrex (Sumatriptan Base)     Unknown  . Penicillins     Unknown    Social History:  reports that she has never smoked. She does not have any smokeless tobacco history on file. She reports that she does not drink alcohol or use illicit drugs.  where does patient live--home, ALF, SNF? and with whom if at home?  Can patient participate in ADLs?   Family History  Problem Relation Age of Onset  . Heart attack Mother       Physical Exam: Filed Vitals:   01/19/12 2030 01/19/12 2100 01/19/12 2130 01/19/12 2154  BP: 122/89 100/60 103/58 108/57  Pulse: 111 99 101   Temp:      TempSrc:      Resp: 18 22 20 18   SpO2: 99% 100% 100% 100%    Physical Exam:  GEN:  Pleasant  examined  and in no acute distress; cooperative with exam Filed Vitals:   01/19/12 2030 01/19/12 2100 01/19/12 2130 01/19/12 2154  BP: 122/89 100/60 103/58 108/57  Pulse: 111 99 101   Temp:      TempSrc:      Resp: 18 22 20 18   SpO2: 99% 100% 100% 100%   Blood pressure 108/57, pulse 101, temperature 98.4 F (36.9 C), temperature source Oral, resp. rate 18, SpO2 100.00%. PSYCH: She is alert and oriented x 2; does not appear anxious does not appear depressed; affect is normal HEENT: Normocephalic and  Atraumatic, Mucous membranes pink; PERRLA; EOM intact; Fundi:  Benign;  No scleral icterus, Nares: Patent, Oropharynx: Clear, Fair Dentition, Neck:  FROM, no cervical lymphadenopathy nor thyromegaly or carotid bruit; no JVD; Breasts:: Not examined CHEST WALL: No tenderness CHEST: Normal respiration, clear to auscultation bilaterally HEART: Regular rate and rhythm; no murmurs rubs or gallops BACK: No kyphosis or scoliosis; no CVA tenderness ABDOMEN: Positive Bowel Sounds, Obese, soft non-tender; no masses, no organomegaly, no pannus; no intertriginous candida. Rectal Exam: Not done EXTREMITIES: No bone or joint deformity; age-appropriate arthropathy of the hands and knees; no cyanosis, clubbing or edema; no ulcerations. Genitalia: not examined PULSES: 2+ and symmetric SKIN: Normal hydration no rash or ulceration CNS: Cranial nerves 2-12 grossly intact no focal neurologic deficit   Labs on Admission:  Basic Metabolic Panel:  Lab 01/19/12 1610  NA 135  K 3.6  CL 103  CO2 21  GLUCOSE 146*  BUN 11  CREATININE 1.15*  CALCIUM 8.9  MG --  PHOS --   Liver Function Tests: No results found for this basename: AST:5,ALT:5,ALKPHOS:5,BILITOT:5,PROT:5,ALBUMIN:5 in the last 168 hours No results found for this basename: LIPASE:5,AMYLASE:5 in the last 168 hours No results found for this basename: AMMONIA:5 in the last 168 hours CBC:  Lab 01/19/12 1716  WBC 10.2  NEUTROABS 9.1*  HGB 11.6*  HCT 34.7*  MCV 86.3  PLT 204   Cardiac Enzymes: No results found for this basename: CKTOTAL:5,CKMB:5,CKMBINDEX:5,TROPONINI:5 in the last 168 hours  BNP (last 3 results) No results found for this basename: PROBNP:3 in the last 8760 hours CBG: No results found for this basename: GLUCAP:5 in the last 168 hours  Radiological Exams on Admission: Ct Head Wo Contrast  01/19/2012  *RADIOLOGY REPORT*  Clinical Data: Decreased level of consciousness  CT HEAD WITHOUT CONTRAST  Technique:  Contiguous axial  images were obtained from the base of the skull through the vertex without contrast.  Comparison: None.  Findings: The basal cisterns are effaced (series 2/image 11).  This appearance is suspicious for cerebral edema with increased intracranial pressure/impending transtentorial herniation.  The falx and tentorium are hyperdense, which could reflect subarachnoid hemorrhage, but is favored to reflect "pseudosubarachnoid hemorrhage" given lack of focal abnormality as well as the above findings.  The visualized paranasal sinuses are essentially clear. The mastoid air cells are unopacified.  No evidence of calvarial fracture.  IMPRESSION: Suspected diffuse cerebral edema.  Effacement of the basal cisterns, suggesting increased intracranial pressure/impending transtentorial herniation.  Hyperdensity in the falx and tentorium, raising the possibility of subarachnoid hemorrhage, although favored to reflect "pseudosubarachnoid hemorrhage" given the above findings.  Critical Value/emergent results were called by telephone at the time of interpretation on 01/19/2012 at 1928 hours to Dr Gerhard Munch, who verbally acknowledged these results.   Original Report Authenticated By: Charline Bills, M.D.    Dg Chest Port 1 View  01/19/2012  *RADIOLOGY REPORT*  Clinical Data: Altered  mental status.  PORTABLE CHEST - 1 VIEW  Comparison: 12/17/2011.  Findings: Trachea is midline.  Heart size normal.  Lungs are low in volume with bibasilar air space disease, right greater than left. No pleural fluid.  IMPRESSION: Low lung volumes with bibasilar air space disease, right greater than left.  In this patient with a reported history of altered mental status, aspiration could have this appearance.  Atelectasis and pneumonia are additional considerations.   Original Report Authenticated By: Reyes Ivan, M.D.     EKG: Independently reviewed.   Assessment:    Active Problems:  Meningitis  Headache Cerebral Edema   Plan:      Admitted to Stepdown Bed Neurology Consulted and Saw in ED Droplet Precautions Covered Empirically for Bacterial and Viral Meningitis with IV Vancomycin and Rocephin, and IV Acyclovir Respectively Decadron therapy for Cerebral Edema Neuro checks MRI/MRA in AM to evaluate further for possible SAH Reconcile Home Medications SCDs for DVT prophylaxis   Code Status: FULL CODE Family Communication:   Disposition Plan:  Return to Home  Time spent: 60 minutes Ron Parker [facility] Pager [phone]  If 7PM-7AM, please contact night-coverage [email or web address] Password [facility] 01/20/2012, 12:35 AM

## 2012-01-20 NOTE — Progress Notes (Signed)
ANTIBIOTIC CONSULT NOTE - INITIAL  Pharmacy Consult for Vancomycin/Acyclovir Indication: r/o meningitis  Allergies  Allergen Reactions  . Amoxicillin     Unknown  . Imitrex (Sumatriptan Base)     Unknown  . Penicillins     Unknown    Patient Measurements: Height: 5\' 5"  (165.1 cm) Weight: 205 lb 7.5 oz (93.2 kg) IBW/kg (Calculated) : 57  Adjusted Body Weight: 71kg  Vital Signs: Temp: 96.1 F (35.6 C) (09/28 0035) Temp src: Oral (09/28 0035) BP: 99/53 mmHg (09/28 0035) Pulse Rate: 87  (09/28 0035) Intake/Output from previous day:   Intake/Output from this shift:    Labs:  [facility] 01/19/12 1716  WBC 10.2  HGB 11.6*  PLT 204  LABCREA --  CREATININE 1.15*   Estimated Creatinine Clearance: 79.3 ml/min (by C-G formula based on Cr of 1.15). No results found for this basename: VANCOTROUGH:2,VANCOPEAK:2,VANCORANDOM:2,GENTTROUGH:2,GENTPEAK:2,GENTRANDOM:2,TOBRATROUGH:2,TOBRAPEAK:2,TOBRARND:2,AMIKACINPEAK:2,AMIKACINTROU:2,AMIKACIN:2, in the last 72 hours   Microbiology: Recent Results (from the past 720 hour(s))  GRAM STAIN     Status: Normal   Collection Time   01/19/12  6:22 PM      Component Value Range Status Comment   Specimen Description URINE, CLEAN CATCH   Final    Special Requests NONE   Final    Gram Stain     Final    Value: CYTOSPIN SLIDE     EPITHELIAL CELLS PRESENT     WBC PRESENT, PREDOMINANTLY PMN     GRAM NEGATIVE RODS   Report Status 01/19/2012 FINAL   Final     Medical History: Past Medical History  Diagnosis Date  . Depression   . GERD (gastroesophageal reflux disease)   . Borderline personality disorder     Medications:  Prescriptions prior to admission  Medication Sig Dispense Refill  . albuterol (PROVENTIL HFA;VENTOLIN HFA) 108 (90 BASE) MCG/ACT inhaler Inhale 2 puffs into the lungs every 6 (six) hours as needed. SHORTNESS OF BREATH      . amphetamine-dextroamphetamine (ADDERALL) 10 MG tablet Take 5 mg by mouth 2 (two) times daily.       . cetirizine (ZYRTEC) 10 MG tablet Take 10 mg by mouth daily.      . cyclobenzaprine (FLEXERIL) 5 MG tablet Take 5 mg by mouth at bedtime as needed. Muscle spasms.      Marland Kitchen gabapentin (NEURONTIN) 300 MG capsule Take 300 mg by mouth daily.       Marland Kitchen ibuprofen (ADVIL,MOTRIN) 200 MG tablet Take 800 mg by mouth every 6 (six) hours as needed. For pain      . nitroGLYCERIN (NITROSTAT) 0.4 MG SL tablet Place 0.4 mg under the tongue every 5 (five) minutes as needed. CHEST PAIN      . omeprazole (PRILOSEC) 20 MG capsule Take 20 mg by mouth daily.       Assessment: 31 y/o female patient admitted with headache and lethargy requiring antibiotics and antivirals for r/o meningitis. Received vanc/ctx/acyclovir in ED at 2100.  Goal of Therapy:  Vancomycin trough level 15-20 mcg/ml  Plan:  Vancomycin 1250mg  IV q8h, acyclovir 700mg  IV q8h and change rocephin to q12h. Monitor renal function and f/u c&s. Measure antibiotic drug levels at steady state  Verlene Mayer, PharmD, BCPS Pager [phone] Verlene Mayer M 01/20/2012,1:07 AM

## 2012-01-20 NOTE — Consult Note (Signed)
Name: [name] MRN: [id number] DOB: [birth year]    LOS: 1  PCCM CONSULT NOTE  PCP- Claudie Revering History of Present Illness: 85 /F adm 9/27 with fever, shaking chills & confusion. She also reported frontal headache. Head CT was suggestive of cerebral edema  - I was called by hospitalist in light of this finding. However her exam was not consistent with this finding. 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  She was subsequently seen by neurosurgery who felt that this could be evaluated as outpt. She was treated empirically for meningitis with ceftriaxone/ vanc. She is much improved & on my arrival was sitting in a chair talking to family. Headache is resolved, afebrile since admission.    Cultures: 9/28 Urine >> e coli 9/28 blood >> G neg rods >>  Antibiotics: ceftx 9/28 >>  Tests / Events:     Past Medical History  Diagnosis Date  . Depression   . GERD (gastroesophageal reflux disease)   . Borderline personality disorder    Past Surgical History  Procedure Date  . Abdominal hysterectomy    Prior to Admission medications   Medication Sig Start Date End Date Taking? Authorizing Provider  albuterol (PROVENTIL HFA;VENTOLIN HFA) 108 (90 BASE) MCG/ACT inhaler Inhale 2 puffs into the lungs every 6 (six) hours as needed. SHORTNESS OF BREATH   Yes Historical Provider, MD  amphetamine-dextroamphetamine (ADDERALL) 10 MG tablet Take 5 mg by mouth 2 (two) times daily.   Yes Historical Provider, MD  cetirizine (ZYRTEC) 10 MG tablet Take 10 mg by mouth daily.   Yes Historical Provider, MD  cyclobenzaprine (FLEXERIL) 5 MG tablet Take 5 mg by mouth at bedtime as needed. Muscle spasms.   Yes Historical Provider, MD  gabapentin (NEURONTIN) 300 MG capsule Take 300 mg by mouth daily.    Yes Historical Provider, MD  ibuprofen (ADVIL,MOTRIN) 200 MG tablet Take 800 mg by mouth every 6 (six) hours  as needed. For pain   Yes Historical Provider, MD  nitroGLYCERIN (NITROSTAT) 0.4 MG SL tablet Place 0.4 mg under the tongue every 5 (five) minutes as needed. CHEST PAIN   Yes Historical Provider, MD  omeprazole (PRILOSEC) 20 MG capsule Take 20 mg by mouth daily.   Yes Historical Provider, MD   Allergies Allergies  Allergen Reactions  . Amoxicillin     Unknown  . Imitrex (Sumatriptan Base)     Unknown  . Penicillins     Unknown    Family History Family History  Problem Relation Age of Onset  . Heart attack Mother     Social History  reports that she has never smoked. She does not have any smokeless tobacco history on file. She reports that she does not drink alcohol or use illicit drugs.  Review Of Systems  General ROS: as per HPI  Psychological ROS: negative for - behavioral disorder, hallucinations, memory difficulties, mood swings or suicidal ideation   negative for - blurry vision, double vision, eye pain or loss of vision  ENT ROS: negative for - epistaxis, nasal discharge, oral lesions, sore throat, tinnitus or vertigo  Allergy and Immunology ROS: negative for - hives or itchy/watery eyes  Hematological and Lymphatic ROS: negative for - bleeding problems, bruising or swollen lymph nodes  Endocrine ROS: negative for - galactorrhea, hair pattern changes, polydipsia/polyuria or temperature intolerance  Respiratory ROS: as per HPI  Cardiovascular ROS: negative for - chest pain,  dyspnea on exertion, edema or irregular heartbeat  Gastrointestinal ROS: negative for - abdominal pain, diarrhea, hematemesis, nausea/vomiting or stool incontinence  Genito-Urinary ROS: negative for - dysuria, hematuria, incontinence or urinary frequency/urgency  Musculoskeletal ROS: negative for - joint swelling or muscular weakness  Neurological ROS: as noted in HPI  Dermatological ROS: negative for rash and skin lesion changes  Vital Signs: Temp:  [96.1 F (35.6 C)-99.8 F (37.7 C)] 97.5 F (36.4  C) (09/28 1549) Pulse Rate:  [63-114] 79  (09/28 1100) Resp:  [2-27] 16  (09/28 1300) BP: (90-122)/(44-89) 93/63 mmHg (09/28 1300) SpO2:  [96 %-100 %] 100 % (09/28 1100) Weight:  [93.2 kg (205 lb 7.5 oz)] 93.2 kg (205 lb 7.5 oz) (09/28 0035) I/O last 3 completed shifts: In: 868.7 [I.V.:616.7; IV Piggyback:252] Out: 500 [Urine:500]  Physical Examination: Gen. Pleasant, well-nourished, in no distress, normal affect ENT - no lesions, no post nasal drip Neck: No JVD, no thyromegaly, no carotid bruits Lungs: no use of accessory muscles, no dullness to percussion, clear without rales or rhonchi  Cardiovascular: Rhythm regular, heart sounds  normal, no murmurs, no peripheral edema Abdomen: soft and non-tender, no hepatosplenomegaly, BS normal. Musculoskeletal: No deformities, no cyanosis or clubbing Neuro:  alert, non focal, no meningeal signs Skin:  Warm, no lesions/ rash   CBC    Component Value Date/Time   WBC 9.5 01/20/2012 0620   RBC 3.92 01/20/2012 0620   HGB 11.5* 01/20/2012 0620   HCT 34.1* 01/20/2012 0620   PLT 192 01/20/2012 0620   MCV 87.0 01/20/2012 0620   MCH 29.3 01/20/2012 0620   MCHC 33.7 01/20/2012 0620   RDW 13.3 01/20/2012 0620   LYMPHSABS 0.9 01/19/2012 1716   MONOABS 0.2 01/19/2012 1716   EOSABS 0.0 01/19/2012 1716   BASOSABS 0.0 01/19/2012 1716    BMET    Component Value Date/Time   NA 139 01/20/2012 0620   K 3.6 01/20/2012 0620   CL 111 01/20/2012 0620   CO2 19 01/20/2012 0620   GLUCOSE 137* 01/20/2012 0620   BUN 9 01/20/2012 0620   CREATININE 0.55 01/20/2012 0620   CALCIUM 8.4 01/20/2012 0620   GFRNONAA >90 01/20/2012 0620   GFRAA >90 01/20/2012 0620         Labs and Imaging:  Reviewed.  Please refer to the Assessment and Plan section for relevant results.  Assessment and Plan: Patient Active Hospital Problem List:  Sepsis (01/20/2012)   Assessment: G neg bacteremia   Plan: ct ceftx , change to PO abx once sensitivities available  No evidence of  meningitis Resume diet  UTI (lower urinary tract infection) (01/20/2012)   Assessment: no evidence of complicated UTI by history   Plan: US renals to assess for hydronephrosis   Discussed above with pt, family [facility] to  Stay primary, can transfer out of ICU in my opinion   Kaislyn Gulas V. 01/20/2012, 5:59 PM

## 2012-01-20 NOTE — Consult Note (Addendum)
Reason for Consult:  "Impending herniation" Referring Physician:  Dr. Calvert Cantor  [name] is an 31 y.o. female.  HPI: Patient is a 31 year old right-handed black female who is seen in emergency consultation at the request of Dr. Butler Denmark regarding "impending herniation."   The patient presented to the [facility] hospital emergency room yesterday. The emergency medicine resident's (Dr. Nadara Mustard) indicate that she presented with complaints of anxiety and tachycardia. His note further indicates that she had altered mental status, fever, and tachycardia. However I cannot find documentation of what her actual temperature was. He further notes that she had dysuria and headache since the previous day, and that her headache was worse with movement of her neck and with light.  A CT scan was done while she was in the emergency room which was described by the interpreting radiologist to be suspicious for diffuse cerebral edema, effacement of the basal cisterns, increased intracranial pressure. I was contacted by Dr. Colbert Coyer, and spoke with him as well as with his supervising attending emergency room physician, Dr. Gerhard Munch. I reviewed the CT scan, which was of notably poor quality and poor resolution, due to the hospital's and department of radiology's decision to do low dose CTs, which significantly limit image quality and resolution. Nonetheless I did not appreciate any intracranial mass or hemorrhage. And in having been presented a limited summary of the patient's circumstances it seemed that meningitis or some other significant medical/neurology condition may well be present, and therefore I recommended medicine/CCM/neurology consultation, treatment, and care.  I received a stat page this morning from Dr. Calvert Cantor because the patient has since undergone a MRI scan and she been told by the neurology consultant Dr. Minus Breeding that neurosurgery consultation was needed.  In speaking with  the patient she explains that she presented to the emergency room yesterday because of a rapid heartbeat and rapid breathing. She also notes that she had fever for 2 days and had some headache and nausea. Reviewing Dr. Cicero Duck note he documents that adenosine 12 mg was administered by EMS during transport to the hospital, and that EMS reported that the patient's SVT resolved with the 12 mg of adenosine.  Further workup included a urinalysis and urine culture. Urinalysis showed positive nitrite, large leukocytes, 7-10 WBC/HPF , and many act.. Urine culture quickly showed gram-negative rods.  The patient was admitted to the triad hospitalist service, and covered empirically for bacterial and viral meningitis with IV vancomycin and Rocephin, and IV acyclovir. She was also started on Decadron.  At this point the patient says that she does feel better as compared to when she presented yesterday, although she does have some mild headache and nausea. She denies any diplopia or blurred vision.  She does report a significant history of panic attacks, depression, and anxiety which are managed by her primary physician Dr. Fleet Contras, at [facility] medical clinic, and by [facility]. She explains that she's been treated with gabapentin 300 mg daily and was recently started on Wellbutrin, she is unsure of the dose. Unfortunately she ran out of her medications about a week ago.  Past medical history: Notable for history of angina for which he uses nitroglycerin when necessary and for a history of anemia. She denies a history of hypertension, myocardial infarction, cancer, stroke, peptic ulcer disease, diabetes, or lung disease.  Family history: Mother died of a myocardial infarction at age 84, father is alive with hypertension.  Social history: Patient is married, she  has 3 children, she doesn't smoke tobacco, she does not drink Etoh, she was taken out of work by Dr. Concepcion Elk (her primary physician)  on 10/25/2010 because of her panic attacks. 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   Past Medical History:  Past Medical History  Diagnosis Date  . Depression   . GERD (gastroesophageal reflux disease)   . Borderline personality disorder     Past Surgical History:  Past Surgical History  Procedure Date  . Abdominal hysterectomy     Family History:  Family History  Problem Relation Age of Onset  . Heart attack Mother     Social History:  reports that she has never smoked. She does not have any smokeless tobacco history on file. She reports that she does not drink alcohol or use illicit drugs.  Allergies:  Allergies  Allergen Reactions  . Amoxicillin     Unknown  . Imitrex (Sumatriptan Base)     Unknown  . Penicillins     Unknown    Medications: I have reviewed the patient's current medications.  Results for orders placed during the hospital encounter of 01/19/12 (from the past 48 hour(s))  CBC WITH DIFFERENTIAL     Status: Abnormal   Collection Time   01/19/12  5:16 PM      Component Value Range Comment   WBC 10.2  4.0 - 10.5 K/uL    RBC 4.02  3.87 - 5.11 MIL/uL    Hemoglobin 11.6 (*) 12.0 - 15.0 g/dL    HCT 16.1 (*) 09.6 - 46.0 %    MCV 86.3  78.0 - 100.0 fL    MCH 28.9  26.0 - 34.0 pg    MCHC 33.4  30.0 - 36.0 g/dL    RDW 04.5  40.9 - 81.1 %    Platelets 204  150 - 400 K/uL    Neutrophils Relative 89 (*) 43 - 77 %    Neutro Abs 9.1 (*) 1.7 - 7.7 K/uL    Lymphocytes Relative 9 (*) 12 - 46 %    Lymphs Abs 0.9  0.7 - 4.0 K/uL    Monocytes Relative 2 (*) 3 - 12 %    Monocytes Absolute 0.2  0.1 - 1.0 K/uL    Eosinophils Relative 0  0 - 5 %    Eosinophils Absolute 0.0  0.0 - 0.7 K/uL    Basophils Relative 0  0 - 1 %    Basophils Absolute 0.0  0.0 - 0.1 K/uL   BASIC METABOLIC PANEL     Status: Abnormal   Collection Time   01/19/12  5:16 PM      Component Value Range Comment   Sodium 135  135 - 145 mEq/L    Potassium 3.6  3.5 - 5.1 mEq/L      Chloride 103  96 - 112 mEq/L    CO2 21  19 - 32 mEq/L    Glucose, Bld 146 (*) 70 - 99 mg/dL    BUN 11  6 - 23 mg/dL    Creatinine, Ser 9.14 (*) 0.50 - 1.10 mg/dL    Calcium 8.9  8.4 - 78.2 mg/dL    GFR calc non Af Amer 62 (*) >90 mL/min    GFR calc Af Amer 72 (*) >90 mL/min   URINE RAPID DRUG SCREEN (HOSP PERFORMED)     Status: Normal   Collection Time   01/19/12  6:22 PM      Component Value  Range Comment   Opiates NONE DETECTED  NONE DETECTED    Cocaine NONE DETECTED  NONE DETECTED    Benzodiazepines NONE DETECTED  NONE DETECTED    Amphetamines NONE DETECTED  NONE DETECTED    Tetrahydrocannabinol NONE DETECTED  NONE DETECTED    Barbiturates NONE DETECTED  NONE DETECTED   URINALYSIS, ROUTINE W REFLEX MICROSCOPIC     Status: Abnormal   Collection Time   01/19/12  6:22 PM      Component Value Range Comment   Color, Urine YELLOW  YELLOW    APPearance CLOUDY (*) CLEAR    Specific Gravity, Urine 1.016  1.005 - 1.030    pH 6.0  5.0 - 8.0    Glucose, UA NEGATIVE  NEGATIVE mg/dL    Hgb urine dipstick MODERATE (*) NEGATIVE    Bilirubin Urine NEGATIVE  NEGATIVE    Ketones, ur NEGATIVE  NEGATIVE mg/dL    Protein, ur 478 (*) NEGATIVE mg/dL    Urobilinogen, UA 1.0  0.0 - 1.0 mg/dL    Nitrite POSITIVE (*) NEGATIVE    Leukocytes, UA LARGE (*) NEGATIVE   GRAM STAIN     Status: Normal   Collection Time   01/19/12  6:22 PM      Component Value Range Comment   Specimen Description URINE, CLEAN CATCH      Special Requests NONE      Gram Stain        Value: CYTOSPIN SLIDE     EPITHELIAL CELLS PRESENT     WBC PRESENT, PREDOMINANTLY PMN     GRAM NEGATIVE RODS   Report Status 01/19/2012 FINAL     URINE MICROSCOPIC-ADD ON     Status: Abnormal   Collection Time   01/19/12  6:22 PM      Component Value Range Comment   Squamous Epithelial / LPF RARE  RARE    WBC, UA 7-10  <3 WBC/hpf    RBC / HPF 0-2  <3 RBC/hpf    Bacteria, UA MANY (*) RARE   POCT PREGNANCY, URINE     Status: Normal    Collection Time   01/19/12  6:27 PM      Component Value Range Comment   Preg Test, Ur NEGATIVE  NEGATIVE   SALICYLATE LEVEL     Status: Abnormal   Collection Time   01/19/12  6:34 PM      Component Value Range Comment   Salicylate Lvl <2.0 (*) 2.8 - 20.0 mg/dL   ACETAMINOPHEN LEVEL     Status: Normal   Collection Time   01/19/12  6:34 PM      Component Value Range Comment   Acetaminophen (Tylenol), Serum <15.0  10 - 30 ug/mL   MRSA PCR SCREENING     Status: Normal   Collection Time   01/20/12  1:00 AM      Component Value Range Comment   MRSA by PCR NEGATIVE  NEGATIVE   BASIC METABOLIC PANEL     Status: Abnormal   Collection Time   01/20/12  6:20 AM      Component Value Range Comment   Sodium 139  135 - 145 mEq/L    Potassium 3.6  3.5 - 5.1 mEq/L    Chloride 111  96 - 112 mEq/L    CO2 19  19 - 32 mEq/L    Glucose, Bld 137 (*) 70 - 99 mg/dL    BUN 9  6 - 23 mg/dL    Creatinine, Ser 2.95  0.50 - 1.10 mg/dL DELTA CHECK NOTED   Calcium 8.4  8.4 - 10.5 mg/dL    GFR calc non Af Amer >90  >90 mL/min    GFR calc Af Amer >90  >90 mL/min   CBC     Status: Abnormal   Collection Time   01/20/12  6:20 AM      Component Value Range Comment   WBC 9.5  4.0 - 10.5 K/uL    RBC 3.92  3.87 - 5.11 MIL/uL    Hemoglobin 11.5 (*) 12.0 - 15.0 g/dL    HCT 16.1 (*) 09.6 - 46.0 %    MCV 87.0  78.0 - 100.0 fL    MCH 29.3  26.0 - 34.0 pg    MCHC 33.7  30.0 - 36.0 g/dL    RDW 04.5  40.9 - 81.1 %    Platelets 192  150 - 400 K/uL     Ct Head Wo Contrast  01/19/2012  *RADIOLOGY REPORT*  Clinical Data: Decreased level of consciousness  CT HEAD WITHOUT CONTRAST  Technique:  Contiguous axial images were obtained from the base of the skull through the vertex without contrast.  Comparison: None.  Findings: The basal cisterns are effaced (series 2/image 11).  This appearance is suspicious for cerebral edema with increased intracranial pressure/impending transtentorial herniation.  The falx and tentorium are  hyperdense, which could reflect subarachnoid hemorrhage, but is favored to reflect "pseudosubarachnoid hemorrhage" given lack of focal abnormality as well as the above findings.  The visualized paranasal sinuses are essentially clear. The mastoid air cells are unopacified.  No evidence of calvarial fracture.  IMPRESSION: Suspected diffuse cerebral edema.  Effacement of the basal cisterns, suggesting increased intracranial pressure/impending transtentorial herniation.  Hyperdensity in the falx and tentorium, raising the possibility of subarachnoid hemorrhage, although favored to reflect "pseudosubarachnoid hemorrhage" given the above findings.  Critical Value/emergent results were called by telephone at the time of interpretation on 01/19/2012 at 1928 hours to Dr Gerhard Munch, who verbally acknowledged these results.   Original Report Authenticated By: Charline Bills, M.D.    Mr [name] Head Wo Contrast  01/20/2012  *RADIOLOGY REPORT*  Clinical Data:  Recent upper respiratory illness, now with fever and lethargy.  MRI HEAD WITHOUT CONTRAST MRA HEAD WITHOUT CONTRAST  Technique:  Multiplanar, multiecho pulse sequences of the brain and surrounding structures were obtained without intravenous contrast. Angiographic images of the head were obtained using MRA technique without contrast.  Comparison:  CT scan earlier in the day.  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  Effacement of the anterior subarachnoid space at the foramen magnum.  No hydrocephalus or hydromyelia.  No acute stroke, hemorrhage, mass lesion, hydrocephalus, or extra- axial fluid.  No atrophy or white matter disease.  No midline shift.  Major intracranial vascular structures patent.  Axial FLAIR imaging demonstrates no definite subarachnoid blood. Meningitis is more difficult to exclude.  Post infusion imaging of the brain could be helpful in further evaluation if there is concern for  an inflammatory process.  Negative orbits.  Mild chronic sinus disease without air-fluid level.  No mastoid fluid.  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  Lumbar puncture is contraindicated.  No visible hydrocephalus or hydromyelia.  No MR signs of subarachnoid hemorrhage or intra-axial brain edema. Meningitis is more difficult to exclude.  Consider post contrast MRI of the brain as clinically indicated.  MRA HEAD  Findings: The carotid arteries are widely patent.  The basilar artery is widely patent with vertebrals codominant.  There is no intracranial stenosis or aneurysm.  There is no cerebellar branch occlusion.  IMPRESSION: Negative MRA.   Original Report Authenticated By: Elsie Stain, M.D.    Mr Brain Wo Contrast  01/20/2012  *RADIOLOGY REPORT*  Clinical Data:  Recent upper respiratory illness, now with fever and lethargy.  MRI HEAD WITHOUT CONTRAST MRA HEAD WITHOUT CONTRAST  Technique:  Multiplanar, multiecho pulse sequences of the brain and surrounding structures were obtained without intravenous contrast. Angiographic images of the head were obtained using MRA technique without contrast.  Comparison:  CT scan earlier in the day.  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  Effacement of the anterior subarachnoid space at the foramen magnum.  No hydrocephalus or hydromyelia.  No acute stroke, hemorrhage, mass lesion, hydrocephalus, or extra- axial fluid.  No atrophy or white matter disease.  No midline shift.  Major intracranial vascular structures patent.  Axial FLAIR imaging demonstrates no definite subarachnoid blood. Meningitis is more difficult to exclude.  Post infusion imaging of the brain could be helpful in further evaluation if there is concern for an inflammatory process.  Negative orbits.  Mild chronic sinus disease without air-fluid level.  No mastoid fluid.  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  Lumbar puncture is contraindicated.  No visible hydrocephalus or hydromyelia.  No MR signs of subarachnoid hemorrhage or intra-axial brain edema. Meningitis is more difficult to exclude.  Consider post contrast MRI of the brain as clinically indicated.  MRA HEAD  Findings: The carotid arteries are widely patent.  The basilar artery is widely patent with vertebrals codominant.  There is no intracranial stenosis or aneurysm.  There is no cerebellar branch occlusion.  IMPRESSION: Negative MRA.   Original Report Authenticated By: Elsie Stain, M.D.    Dg Chest Port 1 View  01/19/2012  *RADIOLOGY REPORT*  Clinical Data: Altered mental status.  PORTABLE CHEST - 1 VIEW  Comparison: 12/17/2011.  Findings: Trachea is midline.  Heart size normal.  Lungs are low in volume with bibasilar air space disease, right greater than left. No pleural fluid.  IMPRESSION: Low lung volumes with bibasilar air space disease, right greater than left.  In this patient with a reported history of altered mental status, aspiration could have this appearance.  Atelectasis and pneumonia are additional considerations.   Original Report Authenticated By: Reyes Ivan, M.D.     Physical examination: Patient is a well-developed well-nourished black female in no acute distress.  She is sitting up in a chair at her bedside. Neck is supple, minimal discomfort on flexion and extension.  Blood pressure 99/60, pulse 79, temperature 98 F (36.7 C), temperature source Oral, resp. rate 18, height 5\' 5"  (1.651 m), weight 93.2 kg (205 lb 7.5 oz), SpO2 100.00%.  Mental status examination: Patient is awake, alert, oriented to name, [facility], [place], and 01/20/2012. Speech is fluent, she has good comprehension. She follows commands briskly. Cranial nerve examination: Pupils are equal round and reactive to light, and about 3 mm bilaterally.  Extraocular movements are intact. Facial sensation is intact. Facial movement is symmetrical. Hearing is present bilaterally. Palatal movement is symmetrical. Shoulder shrug is symmetrical. Tongue is midline. Motor examination: Patient has 5 over 5 strength through the upper and lower extremities including the deltoid, biceps, triceps, intrinsics,  grips, iliopsoas, quadriceps, dorsiflexor, and plantar flexor bilaterally. She has no drift of the upper extremities. Sensory examination: Intact to pinprick throughout. Reflex examination: Trace in the upper and lower extremities bilaterally. Toes are downgoing bilaterally. Gait and stance: Patient has a normal gait and stance.   Assessment/Plan: 31 year old black female who presented to the emergency room with a variety of complaints. She may well have had SVT, treated by EMS in transport with adenosine. She does have a history of panic attacks, depression, and anxiety, and unfortunately ran out of her medications a week ago. She has also been found to have a urinary tract infection with gram-negative rods.  Reviewing her CT scan (poor quality and poor resolution) and her MRI scan she does have a Chiari malformation, without evidence of syringomyelia or syringobulbia. I feel the this congenital variation is not responsible or contributing to any of her acute symptoms. However it might be a cause of some of her intermittent , mild, chronic occipital headache. Certainly no acute neurosurgical intervention is indicated at this time, however I've explained the patient am certainly happy to have her return for followup with me in a month or so. I've asked her to contact my office to schedule appointment if she so wishes.  Certainly at this time the more pressing issues are addressing the episode of SVT that she had yesterday and treating her urinary tract infection.  Hewitt Shorts, MD 01/20/2012, 12:00 PM

## 2012-01-20 NOTE — Progress Notes (Signed)
Location: ICU   Subjective: Patient admitted last night for generalized weakness and fever. Head CT revealed herniation. Patient was started on ABX and steroids for herniation. Patient had an MRI done that shows significant chiari malformation. Patient seen at the bedside, she is ambulating without difficulty and there is no focal neurological deficits She denies headaches, no nausea, no vomiting, no abdominal pain, no shoulder pain,  Mild neck pain in flexion position  She is complaining of being tired and fatigued  Objective: Current vital signs: BP 103/64  Pulse 75  Temp 97.7 F (36.5 C) (Oral)  Resp 13  Ht 5\' 5"  (1.651 m)  Wt 93.2 kg (205 lb 7.5 oz)  BMI 34.19 kg/m2  SpO2 98% Vital signs in last 24 hours: Temp:  [96.1 F (35.6 C)-102.5 F (39.2 C)] 97.7 F (36.5 C) (09/28 0400) Pulse Rate:  [63-124] 75  (09/28 0600) Resp:  [2-29] 13  (09/28 0600) BP: (90-128)/(44-89) 103/64 mmHg (09/28 0600) SpO2:  [96 %-100 %] 98 % (09/28 0600) Weight:  [93.2 kg (205 lb 7.5 oz)] 93.2 kg (205 lb 7.5 oz) (09/28 0035)  Intake/Output from previous day: 09/27 0701 - 09/28 0700 In: 768.7 [I.V.:516.7; IV Piggyback:252] Out: 500 [Urine:500] Intake/Output this shift:   Nutritional status: NPO  Neurologic Exam: Mental Status: Alert, oriented, thought content appropriate.  Speech fluent without evidence of aphasia.  Able to follow 3 step commands without difficulty. Cranial Nerves: II: visual fields grossly normal, pupils equal, round, reactive to light and accommodation III,IV, VI: ptosis not present, extra-ocular motions intact bilaterally V,VII: smile symmetric, facial light touch sensation normal bilaterally VIII: hearing normal bilaterally IX,X: gag reflex present XI: trapezius strength/neck flexion strength normal bilaterally XII: tongue strength normal  Motor: Right : Upper extremity   5/5    Left:     Upper extremity   5/5  Lower extremity   5/5     Lower extremity   5/5 Tone  and bulk:normal tone throughout; no atrophy noted Sensory: Pinprick and light touch intact throughout, bilaterally, positive for Lhermitte sign. Deep Tendon Reflexes: 2+ and symmetric throughout Plantars: Right: downgoing   Left: downgoing No clonus Cerebellar: normal finger-to-nose bradykinetic but normal rapid alternating movements and normal heel-to-shin test normal gait and station Romberg negative No fine motor skills.     Lab Results: Results for orders placed during the hospital encounter of 01/19/12 (from the past 48 hour(s))  CBC WITH DIFFERENTIAL     Status: Abnormal   Collection Time   01/19/12  5:16 PM      Component Value Range Comment   WBC 10.2  4.0 - 10.5 K/uL    RBC 4.02  3.87 - 5.11 MIL/uL    Hemoglobin 11.6 (*) 12.0 - 15.0 g/dL    HCT 40.9 (*) 81.1 - 46.0 %    MCV 86.3  78.0 - 100.0 fL    MCH 28.9  26.0 - 34.0 pg    MCHC 33.4  30.0 - 36.0 g/dL    RDW 91.4  78.2 - 95.6 %    Platelets 204  150 - 400 K/uL    Neutrophils Relative 89 (*) 43 - 77 %    Neutro Abs 9.1 (*) 1.7 - 7.7 K/uL    Lymphocytes Relative 9 (*) 12 - 46 %    Lymphs Abs 0.9  0.7 - 4.0 K/uL    Monocytes Relative 2 (*) 3 - 12 %    Monocytes Absolute 0.2  0.1 - 1.0 K/uL  Eosinophils Relative 0  0 - 5 %    Eosinophils Absolute 0.0  0.0 - 0.7 K/uL    Basophils Relative 0  0 - 1 %    Basophils Absolute 0.0  0.0 - 0.1 K/uL   BASIC METABOLIC PANEL     Status: Abnormal   Collection Time   01/19/12  5:16 PM      Component Value Range Comment   Sodium 135  135 - 145 mEq/L    Potassium 3.6  3.5 - 5.1 mEq/L    Chloride 103  96 - 112 mEq/L    CO2 21  19 - 32 mEq/L    Glucose, Bld 146 (*) 70 - 99 mg/dL    BUN 11  6 - 23 mg/dL    Creatinine, Ser 4.09 (*) 0.50 - 1.10 mg/dL    Calcium 8.9  8.4 - 81.1 mg/dL    GFR calc non Af Amer 62 (*) >90 mL/min    GFR calc Af Amer 72 (*) >90 mL/min   URINE RAPID DRUG SCREEN (HOSP PERFORMED)     Status: Normal   Collection Time   01/19/12  6:22 PM      Component  Value Range Comment   Opiates NONE DETECTED  NONE DETECTED    Cocaine NONE DETECTED  NONE DETECTED    Benzodiazepines NONE DETECTED  NONE DETECTED    Amphetamines NONE DETECTED  NONE DETECTED    Tetrahydrocannabinol NONE DETECTED  NONE DETECTED    Barbiturates NONE DETECTED  NONE DETECTED   URINALYSIS, ROUTINE W REFLEX MICROSCOPIC     Status: Abnormal   Collection Time   01/19/12  6:22 PM      Component Value Range Comment   Color, Urine YELLOW  YELLOW    APPearance CLOUDY (*) CLEAR    Specific Gravity, Urine 1.016  1.005 - 1.030    pH 6.0  5.0 - 8.0    Glucose, UA NEGATIVE  NEGATIVE mg/dL    Hgb urine dipstick MODERATE (*) NEGATIVE    Bilirubin Urine NEGATIVE  NEGATIVE    Ketones, ur NEGATIVE  NEGATIVE mg/dL    Protein, ur 914 (*) NEGATIVE mg/dL    Urobilinogen, UA 1.0  0.0 - 1.0 mg/dL    Nitrite POSITIVE (*) NEGATIVE    Leukocytes, UA LARGE (*) NEGATIVE   GRAM STAIN     Status: Normal   Collection Time   01/19/12  6:22 PM      Component Value Range Comment   Specimen Description URINE, CLEAN CATCH      Special Requests NONE      Gram Stain        Value: CYTOSPIN SLIDE     EPITHELIAL CELLS PRESENT     WBC PRESENT, PREDOMINANTLY PMN     GRAM NEGATIVE RODS   Report Status 01/19/2012 FINAL     URINE MICROSCOPIC-ADD ON     Status: Abnormal   Collection Time   01/19/12  6:22 PM      Component Value Range Comment   Squamous Epithelial / LPF RARE  RARE    WBC, UA 7-10  <3 WBC/hpf    RBC / HPF 0-2  <3 RBC/hpf    Bacteria, UA MANY (*) RARE   POCT PREGNANCY, URINE     Status: Normal   Collection Time   01/19/12  6:27 PM      Component Value Range Comment   Preg Test, Ur NEGATIVE  NEGATIVE   SALICYLATE LEVEL  Status: Abnormal   Collection Time   01/19/12  6:34 PM      Component Value Range Comment   Salicylate Lvl <2.0 (*) 2.8 - 20.0 mg/dL   ACETAMINOPHEN LEVEL     Status: Normal   Collection Time   01/19/12  6:34 PM      Component Value Range Comment   Acetaminophen  (Tylenol), Serum <15.0  10 - 30 ug/mL   MRSA PCR SCREENING     Status: Normal   Collection Time   01/20/12  1:00 AM      Component Value Range Comment   MRSA by PCR NEGATIVE  NEGATIVE   BASIC METABOLIC PANEL     Status: Abnormal   Collection Time   01/20/12  6:20 AM      Component Value Range Comment   Sodium 139  135 - 145 mEq/L    Potassium 3.6  3.5 - 5.1 mEq/L    Chloride 111  96 - 112 mEq/L    CO2 19  19 - 32 mEq/L    Glucose, Bld 137 (*) 70 - 99 mg/dL    BUN 9  6 - 23 mg/dL    Creatinine, Ser 1.61  0.50 - 1.10 mg/dL DELTA CHECK NOTED   Calcium 8.4  8.4 - 10.5 mg/dL    GFR calc non Af Amer >90  >90 mL/min    GFR calc Af Amer >90  >90 mL/min   CBC     Status: Abnormal   Collection Time   01/20/12  6:20 AM      Component Value Range Comment   WBC 9.5  4.0 - 10.5 K/uL    RBC 3.92  3.87 - 5.11 MIL/uL    Hemoglobin 11.5 (*) 12.0 - 15.0 g/dL    HCT 09.6 (*) 04.5 - 46.0 %    MCV 87.0  78.0 - 100.0 fL    MCH 29.3  26.0 - 34.0 pg    MCHC 33.7  30.0 - 36.0 g/dL    RDW 40.9  81.1 - 91.4 %    Platelets 192  150 - 400 K/uL     Recent Results (from the past 240 hour(s))  GRAM STAIN     Status: Normal   Collection Time   01/19/12  6:22 PM      Component Value Range Status Comment   Specimen Description URINE, CLEAN CATCH   Final    Special Requests NONE   Final    Gram Stain     Final    Value: CYTOSPIN SLIDE     EPITHELIAL CELLS PRESENT     WBC PRESENT, PREDOMINANTLY PMN     GRAM NEGATIVE RODS   Report Status 01/19/2012 FINAL   Final   MRSA PCR SCREENING     Status: Normal   Collection Time   01/20/12  1:00 AM      Component Value Range Status Comment   MRSA by PCR NEGATIVE  NEGATIVE Final     Lipid Panel No results found for this basename: CHOL,TRIG,HDL,CHOLHDL,VLDL,LDLCALC in the last 72 hours  Studies/Results: Ct Head Wo Contrast  01/19/2012  *RADIOLOGY REPORT*  Clinical Data: Decreased level of consciousness  CT HEAD WITHOUT CONTRAST  Technique:  Contiguous axial  images were obtained from the base of the skull through the vertex without contrast.  Comparison: None.  Findings: The basal cisterns are effaced (series 2/image 11).  This appearance is suspicious for cerebral edema with increased intracranial pressure/impending transtentorial herniation.  The falx  and tentorium are hyperdense, which could reflect subarachnoid hemorrhage, but is favored to reflect "pseudosubarachnoid hemorrhage" given lack of focal abnormality as well as the above findings.  The visualized paranasal sinuses are essentially clear. The mastoid air cells are unopacified.  No evidence of calvarial fracture.  IMPRESSION: Suspected diffuse cerebral edema.  Effacement of the basal cisterns, suggesting increased intracranial pressure/impending transtentorial herniation.  Hyperdensity in the falx and tentorium, raising the possibility of subarachnoid hemorrhage, although favored to reflect "pseudosubarachnoid hemorrhage" given the above findings.  Critical Value/emergent results were called by telephone at the time of interpretation on 01/19/2012 at 1928 hours to Dr Gerhard Munch, who verbally acknowledged these results.   Original Report Authenticated By: Charline Bills, M.D.    Mr Maxine Glenn Head Wo Contrast  01/20/2012  *RADIOLOGY REPORT*  Clinical Data:  Recent upper respiratory illness, now with fever and lethargy.  MRI HEAD WITHOUT CONTRAST MRA HEAD WITHOUT CONTRAST  Technique:  Multiplanar, multiecho pulse sequences of the brain and surrounding structures were obtained without intravenous contrast. Angiographic images of the head were obtained using MRA technique without contrast.  Comparison:  CT scan earlier in the day.  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  Effacement of the anterior subarachnoid space at the foramen magnum.  No hydrocephalus or hydromyelia.  No acute stroke, hemorrhage, mass lesion, hydrocephalus, or extra- axial  fluid.  No atrophy or white matter disease.  No midline shift.  Major intracranial vascular structures patent.  Axial FLAIR imaging demonstrates no definite subarachnoid blood. Meningitis is more difficult to exclude.  Post infusion imaging of the brain could be helpful in further evaluation if there is concern for an inflammatory process.  Negative orbits.  Mild chronic sinus disease without air-fluid level.  No mastoid fluid.  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  Lumbar puncture is contraindicated.  No visible hydrocephalus or hydromyelia.  No MR signs of subarachnoid hemorrhage or intra-axial brain edema. Meningitis is more difficult to exclude.  Consider post contrast MRI of the brain as clinically indicated.  MRA HEAD  Findings: The carotid arteries are widely patent.  The basilar artery is widely patent with vertebrals codominant.  There is no intracranial stenosis or aneurysm.  There is no cerebellar branch occlusion.  IMPRESSION: Negative MRA.   Original Report Authenticated By: Elsie Stain, M.D.    Mr Brain Wo Contrast  01/20/2012  *RADIOLOGY REPORT*  Clinical Data:  Recent upper respiratory illness, now with fever and lethargy.  MRI HEAD WITHOUT CONTRAST MRA HEAD WITHOUT CONTRAST  Technique:  Multiplanar, multiecho pulse sequences of the brain and surrounding structures were obtained without intravenous contrast. Angiographic images of the head were obtained using MRA technique without contrast.  Comparison:  CT scan earlier in the day.  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  Effacement of the anterior subarachnoid space at the foramen magnum.  No hydrocephalus or hydromyelia.  No acute stroke, hemorrhage, mass lesion, hydrocephalus, or extra- axial fluid.  No atrophy or white matter disease.  No midline shift.  Major intracranial vascular structures patent.  Axial FLAIR  imaging demonstrates no definite subarachnoid blood. Meningitis is more difficult to exclude.  Post infusion imaging of the brain could be helpful in further evaluation if there is concern for an inflammatory process.  Negative orbits.  Mild chronic sinus disease without air-fluid level.  No mastoid fluid.  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  Lumbar puncture is contraindicated.  No visible hydrocephalus or hydromyelia.  No MR signs of subarachnoid hemorrhage or intra-axial brain edema. Meningitis is more difficult to exclude.  Consider post contrast MRI of the brain as clinically indicated.  MRA HEAD  Findings: The carotid arteries are widely patent.  The basilar artery is widely patent with vertebrals codominant.  There is no intracranial stenosis or aneurysm.  There is no cerebellar branch occlusion.  IMPRESSION: Negative MRA.   Original Report Authenticated By: Elsie Stain, M.D.    Dg Chest Port 1 View  01/19/2012  *RADIOLOGY REPORT*  Clinical Data: Altered mental status.  PORTABLE CHEST - 1 VIEW  Comparison: 12/17/2011.  Findings: Trachea is midline.  Heart size normal.  Lungs are low in volume with bibasilar air space disease, right greater than left. No pleural fluid.  IMPRESSION: Low lung volumes with bibasilar air space disease, right greater than left.  In this patient with a reported history of altered mental status, aspiration could have this appearance.  Atelectasis and pneumonia are additional considerations.   Original Report Authenticated By: Reyes Ivan, M.D.     Medications:  Scheduled:   . acetaminophen  650 mg Rectal Once  . acyclovir  700 mg Intravenous Once  . acyclovir  700 mg Intravenous Q8H  . cefTRIAXone (ROCEPHIN)  IV  2 g Intravenous Once  . cefTRIAXone (ROCEPHIN)  IV  2 g Intravenous Q12H  . dexamethasone  10 mg Intravenous Q6H  . gabapentin  300 mg Oral Daily  . ibuprofen  600 mg Oral Once  .  pantoprazole  40 mg Oral Q1200  . sodium chloride  1,000 mL Intravenous Once  . sodium chloride  1,000 mL Intravenous Once  . vancomycin  1,250 mg Intravenous Q8H  . vancomycin  1,500 mg Intravenous Once  . DISCONTD: sodium chloride   Intravenous STAT  . DISCONTD: cefTRIAXone (ROCEPHIN)  IV  2 g Intravenous Q24H  . DISCONTD: dexamethasone (DECADRON) IVPB  10 mg Intravenous Once    Assessment/Plan:  Patient admitted for  Fever and headache and head CT indicated herniation and confirmed by MRI that it was a Chiari Malformation. This may not be meningitis her headaches, generalized weakness and fatigue  could be secondary to Chiari malformation   Recommendations; 1) D/C Acyclovir 2) D/C Steroids 3) Neurosurgical  Consult : patient will need some intervention prior to discharge, there is a significant herniation. 4) Can do MRI with contrast to rule out Meningitis but less likely if this Meningitis  Spent approximately 30 minutes of critical care time, reviewing charts, medications, labs and images, examination and generating a plan  Madlyn Crosby V-P Eilleen Kempf., MD., Ph.D.,MS 01/20/2012 10:38 AM

## 2012-01-20 NOTE — Progress Notes (Addendum)
[facility] PROGRESS NOTE  [name]:[id number] DOB: [birth year] DOA: 01/19/2012 PCP: Dorrene German, MD  Assessment/Plan: Active Problems:  Arnold-Chiari malformation, type I I was called by Neurology this morning and advised that due to a Chiari 1 malformation and impending herniation, I should obtain and urgent Neurosurgical consult. MRI was read as "Chiari 1 malformation with 15 mm of cerebellar tonsillar heriation and impaction."  Therefore, I contacted Dr Newell Coral in request of a consult- Per Dr Earl Gala evaluation, she is not in need of surgery and can follow up with them as an outpatient   Headaches- chronic Per Dr Earl Gala note, may be related to to above mentioned CT findings.    Sepsis/ UTI  continue Rocephin and f/u on cultures- ultrasound ordered by Dr Vassie Loll  SVT? No strips available for review- will follow her on tele and order and ECHO  Code Status: full code Disposition Plan: transfer to Tele DVT prophylaxis:    Brief narrative:  [name] is a 31 y.o. female who presented to the ED with complaints of a severe worsening frontal area headache for the past 2-3 days the fevers and chills and increasing confusion and listlessness over the past 24 hours. She was seen and evaluated in the ED an had a CT scan which revealed Diffuse Cerebral Edema and questionable SAH, Neurosurgery was called and the case was discussed, and the case was deemed nonsurgical and then Neurology was consulted. She was started on IV Vancomycin, and Rocephin, and Acyclovir and Decadron therapy and referred for admission.    Consultants:  Neurosurery  Neurology  Antibiotics: Rocephin, Vancomycin 9/27    HPI/Subjective: Pt alert, headache and neck pain improved. Dysuria remains. No other complaints.   Objective: Filed Vitals:   01/20/12 1549 01/20/12 1600 01/20/12 1700 01/20/12 1800  BP:      Pulse:  81 82 77  Temp: 97.5 F (36.4 C)     TempSrc: Oral       Resp:  16 18 18   Height:      Weight:      SpO2:  99% 100% 100%    Intake/Output Summary (Last 24 hours) at 01/20/12 1811 Last data filed at 01/20/12 1615  Gross per 24 hour  Intake 1368.67 ml  Output   1525 ml  Net -156.33 ml    Exam:   General:  Alert, oriented x 3  Cardiovascular: RRR, no murmurs  Respiratory: CTA b/l  Abdomen: soft, NT, ND, BS+  Ext: no c/c/e  Data Reviewed: Basic Metabolic Panel:  Lab 01/20/12 8119 01/19/12 1716  NA 139 135  K 3.6 3.6  CL 111 103  CO2 19 21  GLUCOSE 137* 146*  BUN 9 11  CREATININE 0.55 1.15*  CALCIUM 8.4 8.9  MG -- --  PHOS -- --   Liver Function Tests: No results found for this basename: AST:5,ALT:5,ALKPHOS:5,BILITOT:5,PROT:5,ALBUMIN:5 in the last 168 hours No results found for this basename: LIPASE:5,AMYLASE:5 in the last 168 hours No results found for this basename: AMMONIA:5 in the last 168 hours CBC:  Lab 01/20/12 0620 01/19/12 1716  WBC 9.5 10.2  NEUTROABS -- 9.1*  HGB 11.5* 11.6*  HCT 34.1* 34.7*  MCV 87.0 86.3  PLT 192 204   Cardiac Enzymes: No results found for this basename: CKTOTAL:5,CKMB:5,CKMBINDEX:5,TROPONINI:5 in the last 168 hours BNP (last 3 results) No results found for this basename: PROBNP:3 in the last 8760 hours CBG: No results found for this basename: GLUCAP:5 in the last 168 hours  Recent  Results (from the past 240 hour(s))  GRAM STAIN     Status: Normal   Collection Time   01/19/12  6:22 PM      Component Value Range Status Comment   Specimen Description URINE, CLEAN CATCH   Final    Special Requests NONE   Final    Gram Stain     Final    Value: CYTOSPIN SLIDE     EPITHELIAL CELLS PRESENT     WBC PRESENT, PREDOMINANTLY PMN     GRAM NEGATIVE RODS   Report Status 01/19/2012 FINAL   Final   URINE CULTURE     Status: Normal (Preliminary result)   Collection Time   01/19/12  6:22 PM      Component Value Range Status Comment   Specimen Description URINE, CATHETERIZED   Final     Special Requests NONE   Final    Culture  Setup Time 01/19/2012 19:36   Final    Colony Count >=100,000 COLONIES/ML   Final    Culture ESCHERICHIA COLI   Final    Report Status PENDING   Incomplete   CULTURE, BLOOD (ROUTINE X 2)     Status: Normal (Preliminary result)   Collection Time   01/19/12  6:43 PM      Component Value Range Status Comment   Specimen Description BLOOD HAND RIGHT   Final    Special Requests BOTTLES DRAWN AEROBIC AND ANAEROBIC 10CC   Final    Culture  Setup Time 01/20/2012 00:43   Final    Culture     Final    Value: GRAM NEGATIVE RODS     Note: Gram Stain Report Called to,Read Back By and Verified With: Rico Ala 01/20/12 @ 2PM BY RUSCA.   Report Status PENDING   Incomplete   CULTURE, BLOOD (ROUTINE X 2)     Status: Normal (Preliminary result)   Collection Time   01/19/12  6:50 PM      Component Value Range Status Comment   Specimen Description BLOOD HAND LEFT   Final    Special Requests BOTTLES DRAWN AEROBIC AND ANAEROBIC 10CC   Final    Culture  Setup Time 01/20/2012 00:43   Final    Culture     Final    Value: GRAM NEGATIVE RODS     Note: Gram Stain Report Called to,Read Back By and Verified With: Rico Ala 01/20/12 @ 2PM BY RUSCA.   Report Status PENDING   Incomplete   MRSA PCR SCREENING     Status: Normal   Collection Time   01/20/12  1:00 AM      Component Value Range Status Comment   MRSA by PCR NEGATIVE  NEGATIVE Final      Studies: Ct Head Wo Contrast  01/19/2012  *RADIOLOGY REPORT*  Clinical Data: Decreased level of consciousness  CT HEAD WITHOUT CONTRAST  Technique:  Contiguous axial images were obtained from the base of the skull through the vertex without contrast.  Comparison: None.  Findings: The basal cisterns are effaced (series 2/image 11).  This appearance is suspicious for cerebral edema with increased intracranial pressure/impending transtentorial herniation.  The falx and tentorium are hyperdense, which could reflect subarachnoid  hemorrhage, but is favored to reflect "pseudosubarachnoid hemorrhage" given lack of focal abnormality as well as the above findings.  The visualized paranasal sinuses are essentially clear. The mastoid air cells are unopacified.  No evidence of calvarial fracture.  IMPRESSION: Suspected diffuse cerebral edema.  Effacement of the  basal cisterns, suggesting increased intracranial pressure/impending transtentorial herniation.  Hyperdensity in the falx and tentorium, raising the possibility of subarachnoid hemorrhage, although favored to reflect "pseudosubarachnoid hemorrhage" given the above findings.  Critical Value/emergent results were called by telephone at the time of interpretation on 01/19/2012 at 1928 hours to Dr Gerhard Munch, who verbally acknowledged these results.   Original Report Authenticated By: Charline Bills, M.D.    Mr Maxine Glenn Head Wo Contrast  01/20/2012  *RADIOLOGY REPORT*  Clinical Data:  Recent upper respiratory illness, now with fever and lethargy.  MRI HEAD WITHOUT CONTRAST MRA HEAD WITHOUT CONTRAST  Technique:  Multiplanar, multiecho pulse sequences of the brain and surrounding structures were obtained without intravenous contrast. Angiographic images of the head were obtained using MRA technique without contrast.  Comparison:  CT scan earlier in the day.  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  Effacement of the anterior subarachnoid space at the foramen magnum.  No hydrocephalus or hydromyelia.  No acute stroke, hemorrhage, mass lesion, hydrocephalus, or extra- axial fluid.  No atrophy or white matter disease.  No midline shift.  Major intracranial vascular structures patent.  Axial FLAIR imaging demonstrates no definite subarachnoid blood. Meningitis is more difficult to exclude.  Post infusion imaging of the brain could be helpful in further evaluation if there is concern for an inflammatory process.  Negative orbits.   Mild chronic sinus disease without air-fluid level.  No mastoid fluid.  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  Lumbar puncture is contraindicated.  No visible hydrocephalus or hydromyelia.  No MR signs of subarachnoid hemorrhage or intra-axial brain edema. Meningitis is more difficult to exclude.  Consider post contrast MRI of the brain as clinically indicated.  MRA HEAD  Findings: The carotid arteries are widely patent.  The basilar artery is widely patent with vertebrals codominant.  There is no intracranial stenosis or aneurysm.  There is no cerebellar branch occlusion.  IMPRESSION: Negative MRA.   Original Report Authenticated By: Elsie Stain, M.D.    Mr Brain Wo Contrast  01/20/2012  *RADIOLOGY REPORT*  Clinical Data:  Recent upper respiratory illness, now with fever and lethargy.  MRI HEAD WITHOUT CONTRAST MRA HEAD WITHOUT CONTRAST  Technique:  Multiplanar, multiecho pulse sequences of the brain and surrounding structures were obtained without intravenous contrast. Angiographic images of the head were obtained using MRA technique without contrast.  Comparison:  CT scan earlier in the day.  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  Effacement of the anterior subarachnoid space at the foramen magnum.  No hydrocephalus or hydromyelia.  No acute stroke, hemorrhage, mass lesion, hydrocephalus, or extra- axial fluid.  No atrophy or white matter disease.  No midline shift.  Major intracranial vascular structures patent.  Axial FLAIR imaging demonstrates no definite subarachnoid blood. Meningitis is more difficult to exclude.  Post infusion imaging of the brain could be helpful in further evaluation if there is concern for an inflammatory process.  Negative orbits.  Mild chronic sinus disease without air-fluid level.  No mastoid fluid.  IMPRESSION: Chiari I malformation with 15 mm of  cerebellar tonsillar herniation and impaction.  Lumbar puncture is contraindicated.  No visible hydrocephalus or hydromyelia.  No MR signs of subarachnoid hemorrhage or intra-axial brain edema. Meningitis is more difficult to exclude.  Consider post contrast MRI of the brain as clinically indicated.  MRA HEAD  Findings: The carotid arteries are widely patent.  The basilar artery is widely patent with vertebrals codominant.  There is no intracranial stenosis or aneurysm.  There is no cerebellar branch occlusion.  IMPRESSION: Negative MRA.   Original Report Authenticated By: Elsie Stain, M.D.    Mr Laqueta Jean Contrast  01/20/2012  *RADIOLOGY REPORT*  Clinical Data: Fever with altered mental status.  Overall improving condition.  MRI HEAD WITH CONTRAST  Technique:  Multiplanar, multiecho pulse sequences of the brain and surrounding structures were obtained according to standard protocol with intravenous contrast  Contrast: 20mL MULTIHANCE GADOBENATE DIMEGLUMINE 529 MG/ML IV SOLN  Comparison: Noncontrast exam 01/19/2012  Findings: Post infusion, there is no abnormal intracranial enhancement.  No evidence for pachymeningeal or leptomeningeal enhancement. Unremarkable tentorium.  Major dural venous sinuses widely patent.  No intra-axial abnormalities to suggest encephalitis or cerebritis, and no evidence for viral, bacterial, or other type of meningeal inflammation.  Redemonstrated is moderately severe Chiari I malformation with 15 mm of cerebellar tonsillar descent.  IMPRESSION: Negative MRI head with contrast.   Original Report Authenticated By: Elsie Stain, M.D.    Dg Chest Port 1 View  01/19/2012  *RADIOLOGY REPORT*  Clinical Data: Altered mental status.  PORTABLE CHEST - 1 VIEW  Comparison: 12/17/2011.  Findings: Trachea is midline.  Heart size normal.  Lungs are low in volume with bibasilar air space disease, right greater than left. No pleural fluid.  IMPRESSION: Low lung volumes with bibasilar air space  disease, right greater than left.  In this patient with a reported history of altered mental status, aspiration could have this appearance.  Atelectasis and pneumonia are additional considerations.   Original Report Authenticated By: Reyes Ivan, M.D.     Scheduled Meds:    . acetaminophen  650 mg Rectal Once  . acyclovir  700 mg Intravenous Once  . cefTRIAXone (ROCEPHIN)  IV  2 g Intravenous Once  . cefTRIAXone (ROCEPHIN)  IV  2 g Intravenous Q24H  . gabapentin  300 mg Oral Daily  . pantoprazole  40 mg Oral Q1200  . sodium chloride  1,000 mL Intravenous Once  . vancomycin  1,500 mg Intravenous Once  . DISCONTD: sodium chloride   Intravenous STAT  . DISCONTD: acyclovir  700 mg Intravenous Q8H  . DISCONTD: cefTRIAXone (ROCEPHIN)  IV  2 g Intravenous Q24H  . DISCONTD: cefTRIAXone (ROCEPHIN)  IV  2 g Intravenous Q12H  . DISCONTD: dexamethasone (DECADRON) IVPB  10 mg Intravenous Once  . DISCONTD: dexamethasone  10 mg Intravenous Q6H  . DISCONTD: vancomycin  1,250 mg Intravenous Q8H   Continuous Infusions:    . sodium chloride 100 mL/hr at 01/20/12 1306    ________________________________________________________________________  Time spent:    [facility]  [facility] Pager [phone] If 8PM-8AM, please contact night-coverage at [email or web address], password [facility] 01/20/2012, 6:11 PM  LOS: 1 day

## 2012-01-20 NOTE — ED Provider Notes (Signed)
  I performed a history and physical examination of [name] and discussed her management with Dr. Colbert Coyer.  I agree with the history, physical, assessment, and plan of care, with the following exceptions: None  On my initial exam the patient was minimally oriented, though protecting her airway.  I performed chart review, and discussed her Hx w family members.  A broad DDX was entertained.  Following return of the CT scan, I discussed the case with Dr. Newell Coral (per radiology recs).  He deferred emergent interventions for the patient.  With the assistance of Dr. Thad Ranger, the patient was admitted to ICU, received ABX, anti-virals, steroids, IVF.  Throughout her ED course she had waxing / waning orientation.  VS were notable for tachycardia (121st, abnormal), hypotension.  She was not hypoxic.  I discussed the CT with radiology. Given the acuity of her lesion she was admitted for further E/M.   Date: 01/20/2012  Rate: 123  Rhythm: sinus tachycardia  QRS Axis: normal  Intervals: normal  ST/T Wave abnormalities: nonspecific T wave changes  Conduction Disutrbances:none  Narrative Interpretation:   Old EKG Reviewed: none available ABNORMAL  CRITICAL CARE Performed by: Gerhard Munch   Total critical care time: 35  Critical care time was exclusive of separately billable procedures and treating other patients.  Critical care was necessary to treat or prevent imminent or life-threatening deterioration.  Critical care was time spent personally by me on the following activities: development of treatment plan with patient and/or surrogate as well as nursing, discussions with consultants, evaluation of patient's response to treatment, examination of patient, obtaining history from patient or surrogate, ordering and performing treatments and interventions, ordering and review of laboratory studies, ordering and review of radiographic studies, pulse oximetry and re-evaluation of patient's  condition.   Elyse Jarvis, MD 01/20/12 [phone]

## 2012-01-21 ENCOUNTER — Inpatient Hospital Stay (HOSPITAL_COMMUNITY): Payer: Medicaid Other

## 2012-01-21 DIAGNOSIS — G039 Meningitis, unspecified: Secondary | ICD-10-CM

## 2012-01-21 LAB — URINE CULTURE: Colony Count: >=100000

## 2012-01-21 MED ORDER — DEXTROSE 5 % IV SOLN
1.0000 g | INTRAVENOUS | Status: DC
  Administered 2012-01-21 – 2012-01-22 (×2): 1 g via INTRAVENOUS
  Filled 2012-01-21 (×2): qty 10

## 2012-01-21 NOTE — Progress Notes (Signed)
Patient in the ICU  Subjective:   Patient seen at the bedside, MRI of the brain with contrast negative for any enhancement or indicative of meningitis. Patient is stable, no focal neurological deficits. She mentions she has had headaches all her life. Blood cultures were positive. She has been treated. No complications overnight No complaints    Objective: Current vital signs: BP 100/68  Pulse 67  Temp 98.6 F (37 C) (Oral)  Resp 14  Ht 5\' 5"  (1.651 m)  Wt 93.2 kg (205 lb 7.5 oz)  BMI 34.19 kg/m2  SpO2 99% Vital signs in last 24 hours: Temp:  [97.5 F (36.4 C)-98.6 F (37 C)] 98.6 F (37 C) (09/29 0400) Pulse Rate:  [62-85] 67  (09/29 0600) Resp:  [14-22] 14  (09/29 0600) BP: (93-113)/(51-75) 100/68 mmHg (09/29 0600) SpO2:  [96 %-100 %] 99 % (09/29 0600)  Intake/Output from previous day: 09/28 0701 - 09/29 0700 In: 500 [I.V.:500] Out: 2425 [Urine:2425] Intake/Output this shift:   Nutritional status: Cardiac   Lungs: CTA Heart: RRR Abdomen: bowel sounds present Skin: No rash Extremity: No edema Neurologic Exam: Mental Status: Alert, oriented, thought content appropriate.  Speech fluent without evidence of aphasia.  Able to follow 3 step commands without difficulty. Cranial Nerves: II: visual fields grossly normal, pupils equal, round, reactive to light and accommodation III,IV, VI: ptosis not present, extra-ocular motions intact bilaterally V,VII: smile symmetric, facial light touch sensation normal bilaterally VIII: hearing normal bilaterally IX,X: gag reflex present XI: trapezius strength/neck flexion strength normal bilaterally XII: tongue strength normal  Motor: Right : Upper extremity   5/5    Left:     Upper extremity   5/5  Lower extremity   5/5     Lower extremity   5/5 Tone and bulk:normal tone throughout; no atrophy noted Sensory: Pinprick and light touch intact throughout, bilaterally Deep Tendon Reflexes: 2+ and symmetric  throughout Plantars: Right: downgoing   Left: downgoing Cerebellar: normal finger-to-nose, normal rapid alternating movements and normal heel-to-shin test normal gait and station    Lab Results: Results for orders placed during the hospital encounter of 01/19/12 (from the past 48 hour(s))  CBC WITH DIFFERENTIAL     Status: Abnormal   Collection Time   01/19/12  5:16 PM      Component Value Range Comment   WBC 10.2  4.0 - 10.5 K/uL    RBC 4.02  3.87 - 5.11 MIL/uL    Hemoglobin 11.6 (*) 12.0 - 15.0 g/dL    HCT 16.1 (*) 09.6 - 46.0 %    MCV 86.3  78.0 - 100.0 fL    MCH 28.9  26.0 - 34.0 pg    MCHC 33.4  30.0 - 36.0 g/dL    RDW 04.5  40.9 - 81.1 %    Platelets 204  150 - 400 K/uL    Neutrophils Relative 89 (*) 43 - 77 %    Neutro Abs 9.1 (*) 1.7 - 7.7 K/uL    Lymphocytes Relative 9 (*) 12 - 46 %    Lymphs Abs 0.9  0.7 - 4.0 K/uL    Monocytes Relative 2 (*) 3 - 12 %    Monocytes Absolute 0.2  0.1 - 1.0 K/uL    Eosinophils Relative 0  0 - 5 %    Eosinophils Absolute 0.0  0.0 - 0.7 K/uL    Basophils Relative 0  0 - 1 %    Basophils Absolute 0.0  0.0 - 0.1 K/uL  BASIC METABOLIC PANEL     Status: Abnormal   Collection Time   01/19/12  5:16 PM      Component Value Range Comment   Sodium 135  135 - 145 mEq/L    Potassium 3.6  3.5 - 5.1 mEq/L    Chloride 103  96 - 112 mEq/L    CO2 21  19 - 32 mEq/L    Glucose, Bld 146 (*) 70 - 99 mg/dL    BUN 11  6 - 23 mg/dL    Creatinine, Ser 6.21 (*) 0.50 - 1.10 mg/dL    Calcium 8.9  8.4 - 30.8 mg/dL    GFR calc non Af Amer 62 (*) >90 mL/min    GFR calc Af Amer 72 (*) >90 mL/min   URINE RAPID DRUG SCREEN (HOSP PERFORMED)     Status: Normal   Collection Time   01/19/12  6:22 PM      Component Value Range Comment   Opiates NONE DETECTED  NONE DETECTED    Cocaine NONE DETECTED  NONE DETECTED    Benzodiazepines NONE DETECTED  NONE DETECTED    Amphetamines NONE DETECTED  NONE DETECTED    Tetrahydrocannabinol NONE DETECTED  NONE DETECTED     Barbiturates NONE DETECTED  NONE DETECTED   URINALYSIS, ROUTINE W REFLEX MICROSCOPIC     Status: Abnormal   Collection Time   01/19/12  6:22 PM      Component Value Range Comment   Color, Urine YELLOW  YELLOW    APPearance CLOUDY (*) CLEAR    Specific Gravity, Urine 1.016  1.005 - 1.030    pH 6.0  5.0 - 8.0    Glucose, UA NEGATIVE  NEGATIVE mg/dL    Hgb urine dipstick MODERATE (*) NEGATIVE    Bilirubin Urine NEGATIVE  NEGATIVE    Ketones, ur NEGATIVE  NEGATIVE mg/dL    Protein, ur 657 (*) NEGATIVE mg/dL    Urobilinogen, UA 1.0  0.0 - 1.0 mg/dL    Nitrite POSITIVE (*) NEGATIVE    Leukocytes, UA LARGE (*) NEGATIVE   GRAM STAIN     Status: Normal   Collection Time   01/19/12  6:22 PM      Component Value Range Comment   Specimen Description URINE, CLEAN CATCH      Special Requests NONE      Gram Stain        Value: CYTOSPIN SLIDE     EPITHELIAL CELLS PRESENT     WBC PRESENT, PREDOMINANTLY PMN     GRAM NEGATIVE RODS   Report Status 01/19/2012 FINAL     URINE CULTURE     Status: Normal (Preliminary result)   Collection Time   01/19/12  6:22 PM      Component Value Range Comment   Specimen Description URINE, CATHETERIZED      Special Requests NONE      Culture  Setup Time 01/19/2012 19:36      Colony Count >=100,000 COLONIES/ML      Culture ESCHERICHIA COLI      Report Status PENDING     URINE MICROSCOPIC-ADD ON     Status: Abnormal   Collection Time   01/19/12  6:22 PM      Component Value Range Comment   Squamous Epithelial / LPF RARE  RARE    WBC, UA 7-10  <3 WBC/hpf    RBC / HPF 0-2  <3 RBC/hpf    Bacteria, UA MANY (*) RARE   POCT PREGNANCY, URINE  Status: Normal   Collection Time   01/19/12  6:27 PM      Component Value Range Comment   Preg Test, Ur NEGATIVE  NEGATIVE   SALICYLATE LEVEL     Status: Abnormal   Collection Time   01/19/12  6:34 PM      Component Value Range Comment   Salicylate Lvl <2.0 (*) 2.8 - 20.0 mg/dL   ACETAMINOPHEN LEVEL     Status: Normal    Collection Time   01/19/12  6:34 PM      Component Value Range Comment   Acetaminophen (Tylenol), Serum <15.0  10 - 30 ug/mL   CULTURE, BLOOD (ROUTINE X 2)     Status: Normal (Preliminary result)   Collection Time   01/19/12  6:43 PM      Component Value Range Comment   Specimen Description BLOOD HAND RIGHT      Special Requests BOTTLES DRAWN AEROBIC AND ANAEROBIC 10CC      Culture  Setup Time 01/20/2012 00:43      Culture        Value: GRAM NEGATIVE RODS     Note: Gram Stain Report Called to,Read Back By and Verified With: Rico Ala 01/20/12 @ 2PM BY RUSCA.   Report Status PENDING     CULTURE, BLOOD (ROUTINE X 2)     Status: Normal (Preliminary result)   Collection Time   01/19/12  6:50 PM      Component Value Range Comment   Specimen Description BLOOD HAND LEFT      Special Requests BOTTLES DRAWN AEROBIC AND ANAEROBIC 10CC      Culture  Setup Time 01/20/2012 00:43      Culture        Value: GRAM NEGATIVE RODS     Note: Gram Stain Report Called to,Read Back By and Verified With: Rico Ala 01/20/12 @ 2PM BY RUSCA.   Report Status PENDING     MRSA PCR SCREENING     Status: Normal   Collection Time   01/20/12  1:00 AM      Component Value Range Comment   MRSA by PCR NEGATIVE  NEGATIVE   BASIC METABOLIC PANEL     Status: Abnormal   Collection Time   01/20/12  6:20 AM      Component Value Range Comment   Sodium 139  135 - 145 mEq/L    Potassium 3.6  3.5 - 5.1 mEq/L    Chloride 111  96 - 112 mEq/L    CO2 19  19 - 32 mEq/L    Glucose, Bld 137 (*) 70 - 99 mg/dL    BUN 9  6 - 23 mg/dL    Creatinine, Ser 1.61  0.50 - 1.10 mg/dL DELTA CHECK NOTED   Calcium 8.4  8.4 - 10.5 mg/dL    GFR calc non Af Amer >90  >90 mL/min    GFR calc Af Amer >90  >90 mL/min   CBC     Status: Abnormal   Collection Time   01/20/12  6:20 AM      Component Value Range Comment   WBC 9.5  4.0 - 10.5 K/uL    RBC 3.92  3.87 - 5.11 MIL/uL    Hemoglobin 11.5 (*) 12.0 - 15.0 g/dL    HCT 09.6 (*) 04.5 - 46.0  %    MCV 87.0  78.0 - 100.0 fL    MCH 29.3  26.0 - 34.0 pg    MCHC 33.7  30.0 -  36.0 g/dL    RDW 40.9  81.1 - 91.4 %    Platelets 192  150 - 400 K/uL     Recent Results (from the past 240 hour(s))  GRAM STAIN     Status: Normal   Collection Time   01/19/12  6:22 PM      Component Value Range Status Comment   Specimen Description URINE, CLEAN CATCH   Final    Special Requests NONE   Final    Gram Stain     Final    Value: CYTOSPIN SLIDE     EPITHELIAL CELLS PRESENT     WBC PRESENT, PREDOMINANTLY PMN     GRAM NEGATIVE RODS   Report Status 01/19/2012 FINAL   Final   URINE CULTURE     Status: Normal (Preliminary result)   Collection Time   01/19/12  6:22 PM      Component Value Range Status Comment   Specimen Description URINE, CATHETERIZED   Final    Special Requests NONE   Final    Culture  Setup Time 01/19/2012 19:36   Final    Colony Count >=100,000 COLONIES/ML   Final    Culture ESCHERICHIA COLI   Final    Report Status PENDING   Incomplete   CULTURE, BLOOD (ROUTINE X 2)     Status: Normal (Preliminary result)   Collection Time   01/19/12  6:43 PM      Component Value Range Status Comment   Specimen Description BLOOD HAND RIGHT   Final    Special Requests BOTTLES DRAWN AEROBIC AND ANAEROBIC 10CC   Final    Culture  Setup Time 01/20/2012 00:43   Final    Culture     Final    Value: GRAM NEGATIVE RODS     Note: Gram Stain Report Called to,Read Back By and Verified With: Rico Ala 01/20/12 @ 2PM BY RUSCA.   Report Status PENDING   Incomplete   CULTURE, BLOOD (ROUTINE X 2)     Status: Normal (Preliminary result)   Collection Time   01/19/12  6:50 PM      Component Value Range Status Comment   Specimen Description BLOOD HAND LEFT   Final    Special Requests BOTTLES DRAWN AEROBIC AND ANAEROBIC 10CC   Final    Culture  Setup Time 01/20/2012 00:43   Final    Culture     Final    Value: GRAM NEGATIVE RODS     Note: Gram Stain Report Called to,Read Back By and Verified With:  Rico Ala 01/20/12 @ 2PM BY RUSCA.   Report Status PENDING   Incomplete   MRSA PCR SCREENING     Status: Normal   Collection Time   01/20/12  1:00 AM      Component Value Range Status Comment   MRSA by PCR NEGATIVE  NEGATIVE Final     Lipid Panel No results found for this basename: CHOL,TRIG,HDL,CHOLHDL,VLDL,LDLCALC in the last 72 hours  Studies/Results: Ct Head Wo Contrast  01/19/2012  *RADIOLOGY REPORT*  Clinical Data: Decreased level of consciousness  CT HEAD WITHOUT CONTRAST  Technique:  Contiguous axial images were obtained from the base of the skull through the vertex without contrast.  Comparison: None.  Findings: The basal cisterns are effaced (series 2/image 11).  This appearance is suspicious for cerebral edema with increased intracranial pressure/impending transtentorial herniation.  The falx and tentorium are hyperdense, which could reflect subarachnoid hemorrhage, but is favored to reflect "pseudosubarachnoid hemorrhage" given  lack of focal abnormality as well as the above findings.  The visualized paranasal sinuses are essentially clear. The mastoid air cells are unopacified.  No evidence of calvarial fracture.  IMPRESSION: Suspected diffuse cerebral edema.  Effacement of the basal cisterns, suggesting increased intracranial pressure/impending transtentorial herniation.  Hyperdensity in the falx and tentorium, raising the possibility of subarachnoid hemorrhage, although favored to reflect "pseudosubarachnoid hemorrhage" given the above findings.  Critical Value/emergent results were called by telephone at the time of interpretation on 01/19/2012 at 1928 hours to Dr Gerhard Munch, who verbally acknowledged these results.   Original Report Authenticated By: Charline Bills, M.D.    Mr Maxine Glenn Head Wo Contrast  01/20/2012  *RADIOLOGY REPORT*  Clinical Data:  Recent upper respiratory illness, now with fever and lethargy.  MRI HEAD WITHOUT CONTRAST MRA HEAD WITHOUT CONTRAST  Technique:   Multiplanar, multiecho pulse sequences of the brain and surrounding structures were obtained without intravenous contrast. Angiographic images of the head were obtained using MRA technique without contrast.  Comparison:  CT scan earlier in the day.  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  Effacement of the anterior subarachnoid space at the foramen magnum.  No hydrocephalus or hydromyelia.  No acute stroke, hemorrhage, mass lesion, hydrocephalus, or extra- axial fluid.  No atrophy or white matter disease.  No midline shift.  Major intracranial vascular structures patent.  Axial FLAIR imaging demonstrates no definite subarachnoid blood. Meningitis is more difficult to exclude.  Post infusion imaging of the brain could be helpful in further evaluation if there is concern for an inflammatory process.  Negative orbits.  Mild chronic sinus disease without air-fluid level.  No mastoid fluid.  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  Lumbar puncture is contraindicated.  No visible hydrocephalus or hydromyelia.  No MR signs of subarachnoid hemorrhage or intra-axial brain edema. Meningitis is more difficult to exclude.  Consider post contrast MRI of the brain as clinically indicated.  MRA HEAD  Findings: The carotid arteries are widely patent.  The basilar artery is widely patent with vertebrals codominant.  There is no intracranial stenosis or aneurysm.  There is no cerebellar branch occlusion.  IMPRESSION: Negative MRA.   Original Report Authenticated By: Elsie Stain, M.D.    Mr Brain Wo Contrast  01/20/2012  *RADIOLOGY REPORT*  Clinical Data:  Recent upper respiratory illness, now with fever and lethargy.  MRI HEAD WITHOUT CONTRAST MRA HEAD WITHOUT CONTRAST  Technique:  Multiplanar, multiecho pulse sequences of the brain and surrounding structures were obtained without intravenous contrast.  Angiographic images of the head were obtained using MRA technique without contrast.  Comparison:  CT scan earlier in the day.  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  Effacement of the anterior subarachnoid space at the foramen magnum.  No hydrocephalus or hydromyelia.  No acute stroke, hemorrhage, mass lesion, hydrocephalus, or extra- axial fluid.  No atrophy or white matter disease.  No midline shift.  Major intracranial vascular structures patent.  Axial FLAIR imaging demonstrates no definite subarachnoid blood. Meningitis is more difficult to exclude.  Post infusion imaging of the brain could be helpful in further evaluation if there is concern for an inflammatory process.  Negative orbits.  Mild chronic sinus disease without air-fluid level.  No mastoid fluid.  IMPRESSION: Chiari I malformation with 15 mm of cerebellar tonsillar herniation and  impaction.  Lumbar puncture is contraindicated.  No visible hydrocephalus or hydromyelia.  No MR signs of subarachnoid hemorrhage or intra-axial brain edema. Meningitis is more difficult to exclude.  Consider post contrast MRI of the brain as clinically indicated.  MRA HEAD  Findings: The carotid arteries are widely patent.  The basilar artery is widely patent with vertebrals codominant.  There is no intracranial stenosis or aneurysm.  There is no cerebellar branch occlusion.  IMPRESSION: Negative MRA.   Original Report Authenticated By: Elsie Stain, M.D.    Mr Laqueta Jean Contrast  01/20/2012  *RADIOLOGY REPORT*  Clinical Data: Fever with altered mental status.  Overall improving condition.  MRI HEAD WITH CONTRAST  Technique:  Multiplanar, multiecho pulse sequences of the brain and surrounding structures were obtained according to standard protocol with intravenous contrast  Contrast: 20mL MULTIHANCE GADOBENATE DIMEGLUMINE 529 MG/ML IV SOLN  Comparison: Noncontrast exam 01/19/2012  Findings: Post  infusion, there is no abnormal intracranial enhancement.  No evidence for pachymeningeal or leptomeningeal enhancement. Unremarkable tentorium.  Major dural venous sinuses widely patent.  No intra-axial abnormalities to suggest encephalitis or cerebritis, and no evidence for viral, bacterial, or other type of meningeal inflammation.  Redemonstrated is moderately severe Chiari I malformation with 15 mm of cerebellar tonsillar descent.  IMPRESSION: Negative MRI head with contrast.   Original Report Authenticated By: Elsie Stain, M.D.    Dg Chest Port 1 View  01/19/2012  *RADIOLOGY REPORT*  Clinical Data: Altered mental status.  PORTABLE CHEST - 1 VIEW  Comparison: 12/17/2011.  Findings: Trachea is midline.  Heart size normal.  Lungs are low in volume with bibasilar air space disease, right greater than left. No pleural fluid.  IMPRESSION: Low lung volumes with bibasilar air space disease, right greater than left.  In this patient with a reported history of altered mental status, aspiration could have this appearance.  Atelectasis and pneumonia are additional considerations.   Original Report Authenticated By: Reyes Ivan, M.D.     Medications:  Scheduled:   . acetaminophen  650 mg Rectal Once  . cefTRIAXone (ROCEPHIN)  IV  2 g Intravenous Q24H  . enoxaparin (LOVENOX) injection  40 mg Subcutaneous Q24H  . gabapentin  300 mg Oral Daily  . pantoprazole  40 mg Oral Q1200  . DISCONTD: acyclovir  700 mg Intravenous Q8H  . DISCONTD: cefTRIAXone (ROCEPHIN)  IV  2 g Intravenous Q12H  . DISCONTD: dexamethasone  10 mg Intravenous Q6H  . DISCONTD: vancomycin  1,250 mg Intravenous Q8H    Assessment/Plan:   Patient with chronic headaches, MRI of the  Brain indicative of Chiari malformation. Seen by Neurosurgeon No intervention at this time. MRI with contrast does not indicate meningitis.  Patient has no focal neurological deficits.   No further management from neurology perspective.  Will sign  off   Spent approximately 30 minutes of critical care time, reviewing charts, medications, labs and images  Eri Mcevers V-P Eilleen Kempf., MD., Ph.D.,MS 01/21/2012 7:49 AM

## 2012-01-21 NOTE — Progress Notes (Signed)
[facility] PROGRESS NOTE  [name] WUJ:[id number] DOB: Mar 08, 1981 DOA: 01/19/2012 PCP: Dorrene German, MD  Assessment/Plan: Active Problems:  [name] malformation, type I I was called by Neurology on 9/28 and advised that due to a Chiari 1 malformation and impending herniation, I should obtain and urgent Neurosurgical consult. 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    E.coli Sepsis- UTI and bactermia continue Rocephin and f/u on sensitivities- ultrasound ordered by Dr Vassie Loll- negative for any pathology.   SVT? No strips available for review- will cont to follow her on tele - ECHO ordered- Possible this was sinus tachycardia- question if the patient had an anxiety attack- she apparently stopped her anxiolytics last week.   Code Status: full code Disposition Plan: transfer to Tele DVT prophylaxis: Lovenox   Brief narrative:  [name] is a 31 y.o. female who presented to the ED with complaints of a severe worsening frontal area headache for the past 2-3 days the fevers and chills and increasing confusion and listlessness over the past 24 hours. She was seen and evaluated in the ED an had a CT scan which revealed Diffuse Cerebral Edema and questionable SAH, Neurosurgery was called and the case was discussed, and the case was deemed nonsurgical and then Neurology was consulted. She was started on IV Vancomycin, and Rocephin, and Acyclovir and Decadron therapy and referred for admission.    Consultants:  Neurosurery  Neurology  Antibiotics: Rocephin9/27 Vancomycin 9/27 - 9/28   HPI/Subjective: Pt  alert-her headache, dysuria, nausea have resolved.   Objective: Filed Vitals:   01/21/12 0400 01/21/12 0600 01/21/12 0800 01/21/12 1337  BP: 100/75 100/68  105/70  Pulse: 62 67 66 65  Temp: 98.6 F (37 C)  96.8 F (36 C) 97.3 F (36.3 C)  TempSrc: Oral  Oral Oral  Resp: 17 14  15   Height:      Weight:      SpO2: 99% 99% 98% 100%    Intake/Output Summary (Last 24 hours) at 01/21/12 1511 Last data filed at 01/21/12 0800  Gross per 24 hour  Intake    360 ml  Output   1900 ml  Net  -1540 ml    Exam:   General:  Alert, oriented x 3  Cardiovascular: RRR, no murmurs  Respiratory: CTA b/l  Abdomen: soft, NT, ND, BS+  Ext: no c/c/e  Data Reviewed: Basic Metabolic Panel:  Lab 01/20/12 9562 01/19/12 1716  NA 139 135  K 3.6 3.6  CL 111 103  CO2 19 21  GLUCOSE 137* 146*  BUN 9 11  CREATININE 0.55 1.15*  CALCIUM 8.4 8.9  MG -- --  PHOS -- --   Liver Function Tests: No results found for this basename: AST:5,ALT:5,ALKPHOS:5,BILITOT:5,PROT:5,ALBUMIN:5 in the last 168 hours No results found for this basename: LIPASE:5,AMYLASE:5 in the last 168 hours No results found for this basename: AMMONIA:5 in the last 168 hours CBC:  Lab 01/20/12 0620 01/19/12 1716  WBC 9.5 10.2  NEUTROABS -- 9.1*  HGB 11.5* 11.6*  HCT 34.1* 34.7*  MCV 87.0 86.3  PLT 192 204   Cardiac Enzymes: No results found for  this basename: CKTOTAL:5,CKMB:5,CKMBINDEX:5,TROPONINI:5 in the last 168 hours BNP (last 3 results) No results found for this basename: PROBNP:3 in the last 8760 hours CBG: No results found for this basename: GLUCAP:5 in the last 168 hours  Recent Results (from the past 240 hour(s))  GRAM STAIN     Status: Normal   Collection Time   01/19/12  6:22 PM      Component Value Range Status Comment   Specimen Description URINE, CLEAN CATCH   Final    Special Requests NONE   Final    Gram Stain     Final    Value: CYTOSPIN SLIDE     EPITHELIAL CELLS PRESENT     WBC PRESENT,  PREDOMINANTLY PMN     GRAM NEGATIVE RODS   Report Status 01/19/2012 FINAL   Final   URINE CULTURE     Status: Normal   Collection Time   01/19/12  6:22 PM      Component Value Range Status Comment   Specimen Description URINE, CATHETERIZED   Final    Special Requests NONE   Final    Culture  Setup Time 01/19/2012 19:36   Final    Colony Count >=100,000 COLONIES/ML   Final    Culture ESCHERICHIA COLI   Final    Report Status 01/21/2012 FINAL   Final    Organism ID, Bacteria ESCHERICHIA COLI   Final   CULTURE, BLOOD (ROUTINE X 2)     Status: Normal (Preliminary result)   Collection Time   01/19/12  6:43 PM      Component Value Range Status Comment   Specimen Description BLOOD HAND RIGHT   Final    Special Requests BOTTLES DRAWN AEROBIC AND ANAEROBIC 10CC   Final    Culture  Setup Time 01/20/2012 00:43   Final    Culture     Final    Value: ESCHERICHIA COLI     Note: Gram Stain Report Called to,Read Back By and Verified With: Rico Ala 01/20/12 @ 2PM BY RUSCA.   Report Status PENDING   Incomplete   CULTURE, BLOOD (ROUTINE X 2)     Status: Normal (Preliminary result)   Collection Time   01/19/12  6:50 PM      Component Value Range Status Comment   Specimen Description BLOOD HAND LEFT   Final    Special Requests BOTTLES DRAWN AEROBIC AND ANAEROBIC 10CC   Final    Culture  Setup Time 01/20/2012 00:43   Final    Culture     Final    Value: ESCHERICHIA COLI     Note: Gram Stain Report Called to,Read Back By and Verified With: Rico Ala 01/20/12 @ 2PM BY RUSCA.   Report Status PENDING   Incomplete   MRSA PCR SCREENING     Status: Normal   Collection Time   01/20/12  1:00 AM      Component Value Range Status Comment   MRSA by PCR NEGATIVE  NEGATIVE Final      Studies: Ct Head Wo Contrast  01/19/2012  *RADIOLOGY REPORT*  Clinical Data: Decreased level of consciousness  CT HEAD WITHOUT CONTRAST  Technique:  Contiguous axial images were obtained from the base of the skull through  the vertex without contrast.  Comparison: None.  Findings: The basal cisterns are effaced (series 2/image 11).  This appearance is suspicious for cerebral edema with increased intracranial pressure/impending transtentorial herniation.  The falx and tentorium are hyperdense, which could reflect subarachnoid hemorrhage, but  is favored to reflect "pseudosubarachnoid hemorrhage" given lack of focal abnormality as well as the above findings.  The visualized paranasal sinuses are essentially clear. The mastoid air cells are unopacified.  No evidence of calvarial fracture.  IMPRESSION: Suspected diffuse cerebral edema.  Effacement of the basal cisterns, suggesting increased intracranial pressure/impending transtentorial herniation.  Hyperdensity in the falx and tentorium, raising the possibility of subarachnoid hemorrhage, although favored to reflect "pseudosubarachnoid hemorrhage" given the above findings.  Critical Value/emergent results were called by telephone at the time of interpretation on 01/19/2012 at 1928 hours to Dr Gerhard Munch, who verbally acknowledged these results.   Original Report Authenticated By: Charline Bills, M.D.    Mr Maxine Glenn Head Wo Contrast  01/20/2012  *RADIOLOGY REPORT*  Clinical Data:  Recent upper respiratory illness, now with fever and lethargy.  MRI HEAD WITHOUT CONTRAST MRA HEAD WITHOUT CONTRAST  Technique:  Multiplanar, multiecho pulse sequences of the brain and surrounding structures were obtained without intravenous contrast. Angiographic images of the head were obtained using MRA technique without contrast.  Comparison:  CT scan earlier in the day.  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  Effacement of the anterior subarachnoid space at the foramen magnum.  No hydrocephalus or hydromyelia.  No acute stroke, hemorrhage, mass lesion, hydrocephalus, or extra- axial fluid.  No atrophy or white matter disease.  No midline  shift.  Major intracranial vascular structures patent.  Axial FLAIR imaging demonstrates no definite subarachnoid blood. Meningitis is more difficult to exclude.  Post infusion imaging of the brain could be helpful in further evaluation if there is concern for an inflammatory process.  Negative orbits.  Mild chronic sinus disease without air-fluid level.  No mastoid fluid.  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  Lumbar puncture is contraindicated.  No visible hydrocephalus or hydromyelia.  No MR signs of subarachnoid hemorrhage or intra-axial brain edema. Meningitis is more difficult to exclude.  Consider post contrast MRI of the brain as clinically indicated.  MRA HEAD  Findings: The carotid arteries are widely patent.  The basilar artery is widely patent with vertebrals codominant.  There is no intracranial stenosis or aneurysm.  There is no cerebellar branch occlusion.  IMPRESSION: Negative MRA.   Original Report Authenticated By: Elsie Stain, M.D.    Mr Brain Wo Contrast  01/20/2012  *RADIOLOGY REPORT*  Clinical Data:  Recent upper respiratory illness, now with fever and lethargy.  MRI HEAD WITHOUT CONTRAST MRA HEAD WITHOUT CONTRAST  Technique:  Multiplanar, multiecho pulse sequences of the brain and surrounding structures were obtained without intravenous contrast. Angiographic images of the head were obtained using MRA technique without contrast.  Comparison:  CT scan earlier in the day.  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  Effacement of the anterior subarachnoid space at the foramen magnum.  No hydrocephalus or hydromyelia.  No acute stroke, hemorrhage, mass lesion, hydrocephalus, or extra- axial fluid.  No atrophy or white matter disease.  No midline shift.  Major intracranial vascular structures patent.  Axial FLAIR imaging demonstrates no definite subarachnoid blood.  Meningitis is more difficult to exclude.  Post infusion imaging of the brain could be helpful in further evaluation if there is concern for an inflammatory process.  Negative orbits.  Mild chronic sinus disease without air-fluid level.  No mastoid fluid.  IMPRESSION: Chiari I malformation with  15 mm of cerebellar tonsillar herniation and impaction.  Lumbar puncture is contraindicated.  No visible hydrocephalus or hydromyelia.  No MR signs of subarachnoid hemorrhage or intra-axial brain edema. Meningitis is more difficult to exclude.  Consider post contrast MRI of the brain as clinically indicated.  MRA HEAD  Findings: The carotid arteries are widely patent.  The basilar artery is widely patent with vertebrals codominant.  There is no intracranial stenosis or aneurysm.  There is no cerebellar branch occlusion.  IMPRESSION: Negative MRA.   Original Report Authenticated By: Elsie Stain, M.D.    Mr Laqueta Jean Contrast  01/20/2012  *RADIOLOGY REPORT*  Clinical Data: Fever with altered mental status.  Overall improving condition.  MRI HEAD WITH CONTRAST  Technique:  Multiplanar, multiecho pulse sequences of the brain and surrounding structures were obtained according to standard protocol with intravenous contrast  Contrast: 20mL MULTIHANCE GADOBENATE DIMEGLUMINE 529 MG/ML IV SOLN  Comparison: Noncontrast exam 01/19/2012  Findings: Post infusion, there is no abnormal intracranial enhancement.  No evidence for pachymeningeal or leptomeningeal enhancement. Unremarkable tentorium.  Major dural venous sinuses widely patent.  No intra-axial abnormalities to suggest encephalitis or cerebritis, and no evidence for viral, bacterial, or other type of meningeal inflammation.  Redemonstrated is moderately severe Chiari I malformation with 15 mm of cerebellar tonsillar descent.  IMPRESSION: Negative MRI head with contrast.   Original Report Authenticated By: Elsie Stain, M.D.    Dg Chest Port 1 View  01/19/2012  *RADIOLOGY  REPORT*  Clinical Data: Altered mental status.  PORTABLE CHEST - 1 VIEW  Comparison: 12/17/2011.  Findings: Trachea is midline.  Heart size normal.  Lungs are low in volume with bibasilar air space disease, right greater than left. No pleural fluid.  IMPRESSION: Low lung volumes with bibasilar air space disease, right greater than left.  In this patient with a reported history of altered mental status, aspiration could have this appearance.  Atelectasis and pneumonia are additional considerations.   Original Report Authenticated By: Reyes Ivan, M.D.     Scheduled Meds:    . acetaminophen  650 mg Rectal Once  . cefTRIAXone (ROCEPHIN)  IV  2 g Intravenous Q24H  . enoxaparin (LOVENOX) injection  40 mg Subcutaneous Q24H  . gabapentin  300 mg Oral Daily  . pantoprazole  40 mg Oral Q1200   Continuous Infusions:    . DISCONTD: sodium chloride 100 mL/hr at 01/20/12 1306    ________________________________________________________________________  Time spent:    [facility]  [facility] Pager [phone] If 8PM-8AM, please contact night-coverage at [email or web address], password [facility] 01/21/2012, 3:11 PM  LOS: 2 days

## 2012-01-22 DIAGNOSIS — R7881 Bacteremia: Secondary | ICD-10-CM

## 2012-01-22 DIAGNOSIS — A498 Other bacterial infections of unspecified site: Secondary | ICD-10-CM

## 2012-01-22 DIAGNOSIS — I471 Supraventricular tachycardia, unspecified: Secondary | ICD-10-CM

## 2012-01-22 HISTORY — DX: Supraventricular tachycardia, unspecified: I47.10

## 2012-01-22 HISTORY — DX: Bacteremia: R78.81

## 2012-01-22 HISTORY — DX: Supraventricular tachycardia: I47.1

## 2012-01-22 LAB — CULTURE, BLOOD (ROUTINE X 2)

## 2012-01-22 MED ORDER — HEPARIN SOD (PORK) LOCK FLUSH 100 UNIT/ML IV SOLN
250.0000 [IU] | INTRAVENOUS | Status: AC | PRN
  Administered 2012-01-22: 500 [IU]

## 2012-01-22 MED ORDER — CEFUROXIME AXETIL 500 MG PO TABS: 500.0000 mg | ORAL_TABLET | Freq: Two times a day (BID) | ORAL | Status: DC

## 2012-01-22 MED ORDER — VERAPAMIL HCL ER 120 MG PO TBCR: 120.0000 mg | EXTENDED_RELEASE_TABLET | Freq: Every day | ORAL | Status: DC

## 2012-01-22 MED ORDER — DEXTROSE 5 % IV SOLN: 1.0000 g | INTRAVENOUS | Status: DC

## 2012-01-22 MED ORDER — SODIUM CHLORIDE 0.9 % IJ SOLN
10.0000 mL | INTRAMUSCULAR | Status: DC | PRN
  Administered 2012-01-22: 10 mL

## 2012-01-22 MED ORDER — VERAPAMIL HCL ER 120 MG PO TBCR
120.0000 mg | EXTENDED_RELEASE_TABLET | Freq: Every day | ORAL | Status: DC
  Filled 2012-01-22: qty 1

## 2012-01-22 NOTE — Discharge Summary (Signed)
Physician Discharge Summary  [name]:[id number] DOB: September 27, 1980 DOA: 01/19/2012  PCP: Dorrene German, MD  Admit date: 01/19/2012 Discharge date: 01/22/2012  Recommendations for Outpatient Follow-up:  1. Patient needs outpatient referral to neurosurgery Dr. Newell Coral 2. Patient needs PICC line removed in one week  Discharge Diagnoses:  E coli bacteremia  Headache acute on chronic  Sepsis-result  Escherichia coli UTI (lower urinary tract infection)  Arnold-Chiari malformation, type I  SVT (supraventricular tachycardia)   Discharge Condition: Good, back to baseline  Diet recommendation: Regular diet  Filed Weights   01/20/12 0035  Weight: 93.2 kg (205 lb 7.5 oz)    History of present illness:  31 year old woman presents emergency room with fever and headache.  Hospital Course:  1. fever, sepsis-initially it was thought that the patient may have meningitis. Blood cultures and urine cultures percent. Lumbar puncture could not be performed due to the incidental finding of Chiari malformation. Patient was started on empiric antibiotics using Rocephin 2 g as well as vancomycin, acyclovir and Decadron. As the clinical picture became more obvious we did find other the patient was having an Escherichia coli urinary tract infection with bacteremia. MRI of the brain failed to find changes to suggest meningitis. Patient's headache improved. Plan is to treat the patient with one week of IV Rocephin in followed by one week of oral Ceftin. 2. Chiari malformation-patient was seen by neurosurgery who recommended outpatient followup 3. episode of SVT in the emergency room-responded to adenosine. Patient was placed on verapamil 120 mg daily with the purpose of also helping with prevention of headaches  Procedures:  MRI brain  PICC  Consultations:  Neurology  Neurosurgery - [place]   Discharge Exam: Filed Vitals:   01/21/12 1337 01/21/12 2130 01/22/12 0027 01/22/12 0600  BP:  105/70 93/74  93/57  Pulse: 65 82  104  Temp: 97.3 F (36.3 C) 98 F (36.7 C) 98.4 F (36.9 C) 99.8 F (37.7 C)  TempSrc: Oral  Axillary   Resp: 15 18  18   Height:      Weight:      SpO2: 100% 98%  96%    General: Alert and oriented x3 Cardiovascular: Regular rate and rhythm without murmurs rubs or gallops Respiratory: Clear to auscultation bilaterally  Discharge Instructions  Discharge Orders    Future Orders Please Complete By Expires   Diet - low sodium heart healthy      Increase activity slowly          Medication List     As of 01/22/2012 10:42 AM    STOP taking these medications         gabapentin 300 MG capsule   Commonly known as: NEURONTIN      TAKE these medications         albuterol 108 (90 BASE) MCG/ACT inhaler   Commonly known as: PROVENTIL HFA;VENTOLIN HFA   Inhale 2 puffs into the lungs every 6 (six) hours as needed. SHORTNESS OF BREATH      amphetamine-dextroamphetamine 10 MG tablet   Commonly known as: ADDERALL   Take 5 mg by mouth 2 (two) times daily.      cefUROXime 500 MG tablet   Commonly known as: CEFTIN   Take 1 tablet (500 mg total) by mouth 2 (two) times daily.      cetirizine 10 MG tablet   Commonly known as: ZYRTEC   Take 10 mg by mouth daily.      cyclobenzaprine 5 MG tablet  Commonly known as: FLEXERIL   Take 5 mg by mouth at bedtime as needed. Muscle spasms.      dextrose 5 % SOLN 50 mL with cefTRIAXone 1 G SOLR 1 g   Inject 1 g into the vein daily.      ibuprofen 200 MG tablet   Commonly known as: ADVIL,MOTRIN   Take 800 mg by mouth every 6 (six) hours as needed. For pain      nitroGLYCERIN 0.4 MG SL tablet   Commonly known as: NITROSTAT   Place 0.4 mg under the tongue every 5 (five) minutes as needed. CHEST PAIN      omeprazole 20 MG capsule   Commonly known as: PRILOSEC   Take 20 mg by mouth daily.      verapamil 120 MG CR tablet   Commonly known as: CALAN-SR   Take 1 tablet (120 mg total) by mouth daily.            Follow-up Information    Follow up with Hewitt Shorts, MD.   Contact information:   [address] [place] [address] [phone]           The results of significant diagnostics from this hospitalization (including imaging, microbiology, ancillary and laboratory) are listed below for reference.    Significant Diagnostic Studies: Ct Head Wo Contrast  01/19/2012  *RADIOLOGY REPORT*  Clinical Data: Decreased level of consciousness  CT HEAD WITHOUT CONTRAST  Technique:  Contiguous axial images were obtained from the base of the skull through the vertex without contrast.  Comparison: None.  Findings: The basal cisterns are effaced (series 2/image 11).  This appearance is suspicious for cerebral edema with increased intracranial pressure/impending transtentorial herniation.  The falx and tentorium are hyperdense, which could reflect subarachnoid hemorrhage, but is favored to reflect "pseudosubarachnoid hemorrhage" given lack of focal abnormality as well as the above findings.  The visualized paranasal sinuses are essentially clear. The mastoid air cells are unopacified.  No evidence of calvarial fracture.  IMPRESSION: Suspected diffuse cerebral edema.  Effacement of the basal cisterns, suggesting increased intracranial pressure/impending transtentorial herniation.  Hyperdensity in the falx and tentorium, raising the possibility of subarachnoid hemorrhage, although favored to reflect "pseudosubarachnoid hemorrhage" given the above findings.  Critical Value/emergent results were called by telephone at the time of interpretation on 01/19/2012 at 1928 hours to Dr Gerhard Munch, who verbally acknowledged these results.   Original Report Authenticated By: Charline Bills, M.D.    Mr Maxine Glenn Head Wo Contrast  01/20/2012  *RADIOLOGY REPORT*  Clinical Data:  Recent upper respiratory illness, now with fever and lethargy.  MRI HEAD WITHOUT CONTRAST MRA HEAD WITHOUT CONTRAST   Technique:  Multiplanar, multiecho pulse sequences of the brain and surrounding structures were obtained without intravenous contrast. Angiographic images of the head were obtained using MRA technique without contrast.  Comparison:  CT scan earlier in the day.  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  Effacement of the anterior subarachnoid space at the foramen magnum.  No hydrocephalus or hydromyelia.  No acute stroke, hemorrhage, mass lesion, hydrocephalus, or extra- axial fluid.  No atrophy or white matter disease.  No midline shift.  Major intracranial vascular structures patent.  Axial FLAIR imaging demonstrates no definite subarachnoid blood. Meningitis is more difficult to exclude.  Post infusion imaging of the brain could be helpful in further evaluation if there is concern for an inflammatory process.  Negative orbits.  Mild chronic sinus disease without air-fluid level.  No mastoid fluid.  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  Lumbar puncture is contraindicated.  No visible hydrocephalus or hydromyelia.  No MR signs of subarachnoid hemorrhage or intra-axial brain edema. Meningitis is more difficult to exclude.  Consider post contrast MRI of the brain as clinically indicated.  MRA HEAD  Findings: The carotid arteries are widely patent.  The basilar artery is widely patent with vertebrals codominant.  There is no intracranial stenosis or aneurysm.  There is no cerebellar branch occlusion.  IMPRESSION: Negative MRA.   Original Report Authenticated By: Elsie Stain, M.D.    Mr Brain Wo Contrast  01/20/2012  *RADIOLOGY REPORT*  Clinical Data:  Recent upper respiratory illness, now with fever and lethargy.  MRI HEAD WITHOUT CONTRAST MRA HEAD WITHOUT CONTRAST  Technique:  Multiplanar, multiecho pulse sequences of the brain and surrounding structures were obtained without intravenous  contrast. Angiographic images of the head were obtained using MRA technique without contrast.  Comparison:  CT scan earlier in the day.  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  Effacement of the anterior subarachnoid space at the foramen magnum.  No hydrocephalus or hydromyelia.  No acute stroke, hemorrhage, mass lesion, hydrocephalus, or extra- axial fluid.  No atrophy or white matter disease.  No midline shift.  Major intracranial vascular structures patent.  Axial FLAIR imaging demonstrates no definite subarachnoid blood. Meningitis is more difficult to exclude.  Post infusion imaging of the brain could be helpful in further evaluation if there is concern for an inflammatory process.  Negative orbits.  Mild chronic sinus disease without air-fluid level.  No mastoid fluid.  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  Lumbar puncture is contraindicated.  No visible hydrocephalus or hydromyelia.  No MR signs of subarachnoid hemorrhage or intra-axial brain edema. Meningitis is more difficult to exclude.  Consider post contrast MRI of the brain as clinically indicated.  MRA HEAD  Findings: The carotid arteries are widely patent.  The basilar artery is widely patent with vertebrals codominant.  There is no intracranial stenosis or aneurysm.  There is no cerebellar branch occlusion.  IMPRESSION: Negative MRA.   Original Report Authenticated By: Elsie Stain, M.D.    Mr Laqueta Jean Contrast  01/20/2012  *RADIOLOGY REPORT*  Clinical Data: Fever with altered mental status.  Overall improving condition.  MRI HEAD WITH CONTRAST  Technique:  Multiplanar, multiecho pulse sequences of the brain and surrounding structures were obtained according to standard protocol with intravenous contrast  Contrast: 20mL MULTIHANCE GADOBENATE DIMEGLUMINE 529 MG/ML IV SOLN  Comparison: Noncontrast exam 01/19/2012  Findings:  Post infusion, there is no abnormal intracranial enhancement.  No evidence for pachymeningeal or leptomeningeal enhancement. Unremarkable tentorium.  Major dural venous sinuses widely patent.  No intra-axial abnormalities to suggest encephalitis or cerebritis, and no evidence for viral, bacterial, or other type of meningeal inflammation.  Redemonstrated is moderately severe Chiari I malformation with 15 mm of cerebellar tonsillar descent.  IMPRESSION: Negative MRI head with contrast.   Original Report Authenticated By: Elsie Stain, M.D.    US Renal  01/21/2012  *RADIOLOGY REPORT*  Clinical Data: Urinary tract infection.  RENAL/URINARY TRACT ULTRASOUND COMPLETE  Comparison:  Abdominal ultrasound 04/05/2006.  Findings:  Right Kidney:  Measures 12.5 cm and appears normal without stone, mass or hydronephrosis.  Left Kidney:  Measures 12.0 cm and appears normal without stone, mass or hydronephrosis.  Bladder:  Appears normal.  IMPRESSION: Normal study.   Original Report Authenticated By: Bernadene Bell. Maricela Curet, M.D.    Dg Chest Port 1 View  01/19/2012  *RADIOLOGY REPORT*  Clinical Data: Altered mental status.  PORTABLE CHEST - 1 VIEW  Comparison: 12/17/2011.  Findings: Trachea is midline.  Heart size normal.  Lungs are low in volume with bibasilar air space disease, right greater than left. No pleural fluid.  IMPRESSION: Low lung volumes with bibasilar air space disease, right greater than left.  In this patient with a reported history of altered mental status, aspiration could have this appearance.  Atelectasis and pneumonia are additional considerations.   Original Report Authenticated By: Reyes Ivan, M.D.     Microbiology: Recent Results (from the past 240 hour(s))  GRAM STAIN     Status: Normal   Collection Time   01/19/12  6:22 PM      Component Value Range Status Comment   Specimen Description URINE, CLEAN CATCH   Final    Special Requests NONE   Final    Gram Stain     Final    Value:  CYTOSPIN SLIDE     EPITHELIAL CELLS PRESENT     WBC PRESENT, PREDOMINANTLY PMN     GRAM NEGATIVE RODS   Report Status 01/19/2012 FINAL   Final   URINE CULTURE     Status: Normal   Collection Time   01/19/12  6:22 PM      Component Value Range Status Comment   Specimen Description URINE, CATHETERIZED   Final    Special Requests NONE   Final    Culture  Setup Time 01/19/2012 19:36   Final    Colony Count >=100,000 COLONIES/ML   Final    Culture ESCHERICHIA COLI   Final    Report Status 01/21/2012 FINAL   Final    Organism ID, Bacteria ESCHERICHIA COLI   Final   CULTURE, BLOOD (ROUTINE X 2)     Status: Normal   Collection Time   01/19/12  6:43 PM      Component Value Range Status Comment   Specimen Description BLOOD HAND RIGHT   Final    Special Requests BOTTLES DRAWN AEROBIC AND ANAEROBIC 10CC   Final    Culture  Setup Time 01/20/2012 00:43   Final    Culture     Final    Value: ESCHERICHIA COLI     Note: SUSCEPTIBILITIES PERFORMED ON PREVIOUS CULTURE WITHIN THE LAST 5 DAYS.     Note: Gram Stain Report Called to,Read Back By and Verified With: Rico Ala 01/20/12 @ 2PM BY RUSCA.   Report Status 01/22/2012 FINAL   Final   CULTURE, BLOOD (ROUTINE X 2)     Status: Normal   Collection Time   01/19/12  6:50 PM      Component Value Range Status Comment   Specimen Description BLOOD HAND LEFT   Final    Special Requests BOTTLES DRAWN AEROBIC AND ANAEROBIC 10CC   Final    Culture  Setup Time 01/20/2012 00:43   Final    Culture     Final    Value: ESCHERICHIA COLI     Note: Gram Stain Report Called to,Read Back By and Verified With: Rico Ala 01/20/12 @ 2PM BY RUSCA.   Report Status 01/22/2012 FINAL   Final    Organism ID, Bacteria ESCHERICHIA COLI   Final   MRSA  PCR SCREENING     Status: Normal   Collection Time   01/20/12  1:00 AM      Component Value Range Status Comment   MRSA by PCR NEGATIVE  NEGATIVE Final      Labs: Basic Metabolic Panel:  Lab 01/20/12 1610 01/19/12 1716   NA 139 135  K 3.6 3.6  CL 111 103  CO2 19 21  GLUCOSE 137* 146*  BUN 9 11  CREATININE 0.55 1.15*  CALCIUM 8.4 8.9  MG -- --  PHOS -- --   Liver Function Tests: No results found for this basename: AST:5,ALT:5,ALKPHOS:5,BILITOT:5,PROT:5,ALBUMIN:5 in the last 168 hours No results found for this basename: LIPASE:5,AMYLASE:5 in the last 168 hours No results found for this basename: AMMONIA:5 in the last 168 hours CBC:  Lab 01/20/12 0620 01/19/12 1716  WBC 9.5 10.2  NEUTROABS -- 9.1*  HGB 11.5* 11.6*  HCT 34.1* 34.7*  MCV 87.0 86.3  PLT 192 204   Cardiac Enzymes: No results found for this basename: CKTOTAL:5,CKMB:5,CKMBINDEX:5,TROPONINI:5 in the last 168 hours BNP: BNP (last 3 results) No results found for this basename: PROBNP:3 in the last 8760 hours CBG: No results found for this basename: GLUCAP:5 in the last 168 hours  Time coordinating discharge: 50 minutes  Signed:  Joshaua Epple  [facility] 01/22/2012, 10:42 AM

## 2012-01-22 NOTE — Progress Notes (Signed)
Pt was dressed and sitting on the side of the bed when I arrived. She told me she was going home and thanked God she was better. We talked a few more minutes about her stay and knowing had prayer. Pt's husband arrived during prayer and seemed very supportive of wife as he brought in her shoes. Pt was thankful for visit and prayer.  Marjory Lies, Chaplain

## 2012-01-22 NOTE — Progress Notes (Signed)
Peripherally Inserted Central Catheter/Midline Placement  The IV Nurse has discussed with the patient and/or persons authorized to consent for the patient, the purpose of this procedure and the potential benefits and risks involved with this procedure.  The benefits include less needle sticks, lab draws from the catheter and patient may be discharged home with the catheter.  Risks include, but not limited to, infection, bleeding, blood clot (thrombus formation), and puncture of an artery; nerve damage and irregular heat beat.  Alternatives to this procedure were also discussed.  PICC/Midline Placement Documentation        [name] 01/22/2012, 1:30 PM

## 2012-01-22 NOTE — Care Management Note (Signed)
    Page 1 of 1   01/22/2012     11:55:43 AM   CARE MANAGEMENT NOTE 01/22/2012  Patient:  [name], [name]   Account Number:  [id number]  Date Initiated:  01/22/2012  Documentation initiated by:  GRAVES-BIGELOW,Annelyse Rey  Subjective/Objective Assessment:   Pt admitted with headache and fever. Pt is from home with husband. Plans for home with IV Rocephin. CM did make referral for hh RN services for IV infusion with [facility] for services.     Action/Plan:   SOC to begin within 24-48 hours post d/c. No further needs from CM at this time.   Anticipated DC Date:  01/22/2012   Anticipated DC Plan:  HOME W HOME HEALTH SERVICES      DC Planning Services  CM consult      [facility] Choice  HOME HEALTH   Choice offered to / List presented to:  C-1 Patient        HH arranged  HH-1 RN  HH-10 DISEASE MANAGEMENT      HH agency  [facility]   Status of service:  Completed, signed off Medicare Important Message given?   (If response is "NO", the following Medicare IM given date fields will be blank) Date Medicare IM given:   Date Additional Medicare IM given:    Discharge Disposition:  HOME W HOME HEALTH SERVICES  Per UR Regulation:  Reviewed for med. necessity/level of care/duration of stay  If discussed at Long Length of Stay Meetings, dates discussed:    Comments:

## 2012-01-22 NOTE — Progress Notes (Signed)
  Echocardiogram 2D Echocardiogram has been performed.  Emelia Loron 01/22/2012, 3:32 PM

## 2012-06-08 ENCOUNTER — Other Ambulatory Visit: Payer: Self-pay

## 2012-06-08 ENCOUNTER — Encounter (HOSPITAL_COMMUNITY): Payer: Self-pay | Admitting: Cardiology

## 2012-06-08 ENCOUNTER — Emergency Department (HOSPITAL_COMMUNITY)
Admission: EM | Admit: 2012-06-08 | Discharge: 2012-06-08 | Discharge status: Against Medical Advice | Payer: Medicaid Other | Attending: Emergency Medicine | Admitting: Emergency Medicine

## 2012-06-08 DIAGNOSIS — R42 Dizziness and giddiness: Secondary | ICD-10-CM | POA: Insufficient documentation

## 2012-06-08 DIAGNOSIS — R52 Pain, unspecified: Secondary | ICD-10-CM | POA: Insufficient documentation

## 2012-06-08 DIAGNOSIS — F411 Generalized anxiety disorder: Secondary | ICD-10-CM | POA: Insufficient documentation

## 2012-06-08 NOTE — ED Notes (Signed)
Pt reports pain all over and dizziness over the past couple of days. Pt also reports that she has been forgetting things more often lately. No distress noted. Pt A&Ox4 at triage.

## 2012-06-21 ENCOUNTER — Other Ambulatory Visit: Payer: Self-pay | Admitting: Neurosurgery

## 2012-06-21 DIAGNOSIS — G935 Compression of brain: Secondary | ICD-10-CM

## 2012-06-24 ENCOUNTER — Ambulatory Visit
Admission: RE | Admit: 2012-06-24 | Discharge: 2012-06-24 | Disposition: A | Discharge status: Home / Self Care | Payer: Medicaid Other | Source: Ambulatory Visit | Attending: Neurosurgery | Admitting: Neurosurgery

## 2012-06-24 DIAGNOSIS — G935 Compression of brain: Secondary | ICD-10-CM

## 2012-07-02 ENCOUNTER — Other Ambulatory Visit: Payer: Self-pay | Admitting: Neurosurgery

## 2012-07-05 ENCOUNTER — Encounter (HOSPITAL_COMMUNITY): Payer: Self-pay | Admitting: Pharmacy Technician

## 2012-07-11 NOTE — Pre-Procedure Instructions (Signed)
[name]  07/11/2012   Your procedure is scheduled on:  Monday, March 31st  Report to [facility] at 0815 AM.  Call this number if you have problems the morning of surgery: [phone]   Remember:   Do not eat food or drink liquids after midnight.    Take these medicines the morning of surgery with A SIP OF WATER: lexapro, prilosec   Do not wear jewelry, make-up or nail polish.  Do not wear lotions, powders, or perfume,deodorant.  Do not shave 48 hours prior to surgery.   Do not bring valuables to the hospital.  Contacts, dentures or bridgework may not be worn into surgery.  Leave suitcase in the car. After surgery it may be brought to your room.  For patients admitted to the hospital, checkout time is 11:00 AM the day of discharge.   Patients discharged the day of surgery will not be allowed to drive home.    Special Instructions: Shower using CHG 2 nights before surgery and the night before surgery.  If you shower the day of surgery use CHG.  Use special wash - you have one bottle of CHG for all showers.  You should use approximately 1/3 of the bottle for each shower.   Please read over the following fact sheets that you were given: Pain Booklet, Coughing and Deep Breathing, Blood Transfusion Information, MRSA Information and Surgical Site Infection Prevention

## 2012-07-12 ENCOUNTER — Encounter (HOSPITAL_COMMUNITY)
Admission: RE | Admit: 2012-07-12 | Discharge: 2012-07-12 | Disposition: A | Discharge status: Home / Self Care | Payer: Medicaid Other | Source: Ambulatory Visit | Attending: Neurosurgery | Admitting: Neurosurgery

## 2012-07-12 ENCOUNTER — Encounter (HOSPITAL_COMMUNITY): Payer: Self-pay

## 2012-07-12 HISTORY — DX: Unspecified asthma, uncomplicated: J45.909

## 2012-07-12 HISTORY — DX: Anemia, unspecified: D64.9

## 2012-07-12 HISTORY — DX: Headache: R51

## 2012-07-12 HISTORY — DX: Anxiety disorder, unspecified: F41.9

## 2012-07-12 HISTORY — DX: Meningitis, unspecified: G03.9

## 2012-07-12 HISTORY — DX: Angina pectoris, unspecified: I20.9

## 2012-07-12 HISTORY — DX: Myoneural disorder, unspecified: G70.9

## 2012-07-12 LAB — BASIC METABOLIC PANEL
BUN: 8 mg/dL (ref 6–23)
CO2: 25 mEq/L (ref 19–32)
Calcium: 9.5 mg/dL (ref 8.4–10.5)
Chloride: 104 mEq/L (ref 96–112)
Creatinine, Ser: 0.68 mg/dL (ref 0.50–1.10)
GFR calc Af Amer: >90 mL/min (ref >90)
GFR calc non Af Amer: >90 mL/min (ref >90)
Glucose, Bld: 92 mg/dL (ref 70–99)
Potassium: 3.9 mEq/L (ref 3.5–5.1)
Sodium: 137 mEq/L (ref 135–145)

## 2012-07-12 LAB — CBC
HCT: 37.1 % (ref 36.0–46.0)
Hemoglobin: 12.9 g/dL (ref 12.0–15.0)
MCH: 29.5 pg (ref 26.0–34.0)
MCHC: 34.8 g/dL (ref 30.0–36.0)
MCV: 84.7 fL (ref 78.0–100.0)
Platelets: 269 10*3/uL (ref 150–400)
RBC: 4.38 MIL/uL (ref 3.87–5.11)
RDW: 12.9 % (ref 11.5–15.5)
WBC: 5.5 10*3/uL (ref 4.0–10.5)

## 2012-07-12 LAB — SURGICAL PCR SCREEN
MRSA, PCR: NEGATIVE
Staphylococcus aureus: NEGATIVE

## 2012-07-12 LAB — ABO/RH: ABO/RH(D): O POS

## 2012-07-12 LAB — TYPE AND SCREEN
ABO/RH(D): O POS
Antibody Screen: NEGATIVE

## 2012-07-12 NOTE — Progress Notes (Signed)
Call to Dr. Sharyn Lull for records- line busy two times, Call to Dr. Lenna Sciara- request last OV note & ekg, spoke with Ladene Artist

## 2012-07-12 NOTE — Progress Notes (Signed)
Call to Dr. Albertina Parr office , spoke with [place] & told her that we are still waiting on record.  She agrees Ladene Artist will follow up.

## 2012-07-21 MED ORDER — VANCOMYCIN HCL IN DEXTROSE 1-5 GM/200ML-% IV SOLN
1000.0000 mg | INTRAVENOUS | Status: AC
  Administered 2012-07-22: 1000 mg via INTRAVENOUS

## 2012-07-22 ENCOUNTER — Encounter (HOSPITAL_COMMUNITY): Payer: Self-pay | Admitting: *Deleted

## 2012-07-22 ENCOUNTER — Inpatient Hospital Stay (HOSPITAL_COMMUNITY): Payer: Medicaid Other | Admitting: Anesthesiology

## 2012-07-22 ENCOUNTER — Encounter (HOSPITAL_COMMUNITY)
Admission: RE | Disposition: A | Discharge status: Home / Self Care | Payer: Self-pay | Source: Ambulatory Visit | Attending: Neurosurgery

## 2012-07-22 ENCOUNTER — Encounter (HOSPITAL_COMMUNITY): Payer: Self-pay | Admitting: Anesthesiology

## 2012-07-22 ENCOUNTER — Inpatient Hospital Stay (HOSPITAL_COMMUNITY)
Admission: RE | Admit: 2012-07-22 | Discharge: 2012-07-25 | DRG: 027 | Disposition: A | Discharge status: Home / Self Care | Payer: Medicaid Other | Source: Ambulatory Visit | Attending: Neurosurgery | Admitting: Neurosurgery

## 2012-07-22 DIAGNOSIS — F411 Generalized anxiety disorder: Secondary | ICD-10-CM | POA: Diagnosis present

## 2012-07-22 DIAGNOSIS — F329 Major depressive disorder, single episode, unspecified: Secondary | ICD-10-CM | POA: Diagnosis present

## 2012-07-22 DIAGNOSIS — G935 Compression of brain: Principal | ICD-10-CM | POA: Diagnosis present

## 2012-07-22 DIAGNOSIS — F3289 Other specified depressive episodes: Secondary | ICD-10-CM | POA: Diagnosis present

## 2012-07-22 DIAGNOSIS — J45909 Unspecified asthma, uncomplicated: Secondary | ICD-10-CM | POA: Diagnosis present

## 2012-07-22 DIAGNOSIS — K219 Gastro-esophageal reflux disease without esophagitis: Secondary | ICD-10-CM | POA: Diagnosis present

## 2012-07-22 DIAGNOSIS — M62838 Other muscle spasm: Secondary | ICD-10-CM | POA: Diagnosis present

## 2012-07-22 DIAGNOSIS — F603 Borderline personality disorder: Secondary | ICD-10-CM | POA: Diagnosis present

## 2012-07-22 HISTORY — PX: SUBOCCIPITAL CRANIECTOMY CERVICAL LAMINECTOMY: SHX5404

## 2012-07-22 SURGERY — SUBOCCIPITAL CRANIECTOMY CERVICAL LAMINECTOMY/DURAPLASTY
Anesthesia: General | Site: Head | Wound class: Clean

## 2012-07-22 MED ORDER — PROMETHAZINE HCL 25 MG PO TABS: 12.5000 mg | ORAL_TABLET | ORAL | Status: DC | PRN

## 2012-07-22 MED ORDER — ONDANSETRON HCL 4 MG/2ML IJ SOLN
INTRAMUSCULAR | Status: DC | PRN
  Administered 2012-07-22: 4 mg via INTRAVENOUS

## 2012-07-22 MED ORDER — BUPIVACAINE-EPINEPHRINE 0.5% -1:200000 IJ SOLN
INTRAMUSCULAR | Status: DC | PRN
  Administered 2012-07-22: 30 mL

## 2012-07-22 MED ORDER — PROPOFOL 10 MG/ML IV BOLUS
INTRAVENOUS | Status: DC | PRN
  Administered 2012-07-22: 160 mg via INTRAVENOUS
  Administered 2012-07-22: 40 mg via INTRAVENOUS

## 2012-07-22 MED ORDER — POTASSIUM CHLORIDE IN NACL 20-0.9 MEQ/L-% IV SOLN
INTRAVENOUS | Status: DC
  Administered 2012-07-22 – 2012-07-23 (×3): via INTRAVENOUS
  Administered 2012-07-24: 1000 mL via INTRAVENOUS
  Filled 2012-07-22 (×7): qty 1000

## 2012-07-22 MED ORDER — LORATADINE 10 MG PO TABS
10.0000 mg | ORAL_TABLET | Freq: Every day | ORAL | Status: DC
  Administered 2012-07-23 – 2012-07-25 (×3): 10 mg via ORAL
  Filled 2012-07-22 (×3): qty 1

## 2012-07-22 MED ORDER — LABETALOL HCL 5 MG/ML IV SOLN: 5.0000 mg | INTRAVENOUS | Status: DC | PRN

## 2012-07-22 MED ORDER — MORPHINE SULFATE 2 MG/ML IJ SOLN
1.0000 mg | INTRAMUSCULAR | Status: DC | PRN
  Administered 2012-07-22 – 2012-07-24 (×15): 2 mg via INTRAVENOUS
  Filled 2012-07-22 (×14): qty 1

## 2012-07-22 MED ORDER — GENTAMICIN SULFATE 40 MG/ML IJ SOLN
139.8000 mg | INTRAVENOUS | Status: DC | PRN
  Administered 2012-07-22: 80 mg via INTRAVENOUS

## 2012-07-22 MED ORDER — ALBUMIN HUMAN 5 % IV SOLN
INTRAVENOUS | Status: DC | PRN
  Administered 2012-07-22: 14:00:00 via INTRAVENOUS

## 2012-07-22 MED ORDER — LIDOCAINE-EPINEPHRINE 1 %-1:100000 IJ SOLN
INTRAMUSCULAR | Status: DC | PRN
  Administered 2012-07-22: 20 mL

## 2012-07-22 MED ORDER — LACTATED RINGERS IV SOLN
INTRAVENOUS | Status: DC | PRN
  Administered 2012-07-22 (×2): via INTRAVENOUS

## 2012-07-22 MED ORDER — 0.9 % SODIUM CHLORIDE (POUR BTL) OPTIME
TOPICAL | Status: DC | PRN
  Administered 2012-07-22 (×4): 1000 mL

## 2012-07-22 MED ORDER — ESCITALOPRAM OXALATE 10 MG PO TABS: 10.0000 mg | ORAL_TABLET | Freq: Every day | ORAL | Status: DC

## 2012-07-22 MED ORDER — NITROGLYCERIN 0.4 MG SL SUBL: 0.4000 mg | SUBLINGUAL_TABLET | SUBLINGUAL | Status: DC | PRN

## 2012-07-22 MED ORDER — PANTOPRAZOLE SODIUM 40 MG PO TBEC
80.0000 mg | DELAYED_RELEASE_TABLET | Freq: Every day | ORAL | Status: DC
  Administered 2012-07-23 – 2012-07-25 (×3): 80 mg via ORAL
  Filled 2012-07-22 (×2): qty 2
  Filled 2012-07-22: qty 1

## 2012-07-22 MED ORDER — SODIUM CHLORIDE 0.9 % IV SOLN
INTRAVENOUS | Status: AC
  Filled 2012-07-22: qty 500

## 2012-07-22 MED ORDER — HYDRALAZINE HCL 20 MG/ML IJ SOLN: 5.0000 mg | INTRAMUSCULAR | Status: DC | PRN

## 2012-07-22 MED ORDER — EPHEDRINE SULFATE 50 MG/ML IJ SOLN
INTRAMUSCULAR | Status: DC | PRN
  Administered 2012-07-22: 10 mg via INTRAVENOUS

## 2012-07-22 MED ORDER — THROMBIN 20000 UNITS EX SOLR
OROMUCOSAL | Status: DC | PRN
  Administered 2012-07-22: 14:00:00 via TOPICAL

## 2012-07-22 MED ORDER — BACITRACIN 50000 UNITS IM SOLR
INTRAMUSCULAR | Status: AC
  Filled 2012-07-22: qty 1

## 2012-07-22 MED ORDER — GLYCOPYRROLATE 0.2 MG/ML IJ SOLN
INTRAMUSCULAR | Status: DC | PRN
  Administered 2012-07-22: 0.2 mg via INTRAVENOUS
  Administered 2012-07-22: 0.6 mg via INTRAVENOUS

## 2012-07-22 MED ORDER — PANTOPRAZOLE SODIUM 40 MG IV SOLR
40.0000 mg | Freq: Every day | INTRAVENOUS | Status: DC
  Administered 2012-07-22 – 2012-07-23 (×2): 40 mg via INTRAVENOUS
  Filled 2012-07-22 (×3): qty 40

## 2012-07-22 MED ORDER — ALBUTEROL SULFATE HFA 108 (90 BASE) MCG/ACT IN AERS
2.0000 | INHALATION_SPRAY | Freq: Four times a day (QID) | RESPIRATORY_TRACT | Status: DC | PRN
  Filled 2012-07-22: qty 6.7

## 2012-07-22 MED ORDER — ONDANSETRON HCL 4 MG/2ML IJ SOLN
4.0000 mg | Freq: Four times a day (QID) | INTRAMUSCULAR | Status: DC
  Administered 2012-07-22 – 2012-07-23 (×3): 4 mg via INTRAVENOUS
  Filled 2012-07-22 (×3): qty 2

## 2012-07-22 MED ORDER — HYDROXYZINE HCL 25 MG PO TABS
50.0000 mg | ORAL_TABLET | ORAL | Status: DC | PRN
  Administered 2012-07-24: 50 mg via ORAL
  Filled 2012-07-22: qty 1
  Filled 2012-07-22: qty 2

## 2012-07-22 MED ORDER — ESCITALOPRAM OXALATE 20 MG PO TABS
30.0000 mg | ORAL_TABLET | Freq: Every day | ORAL | Status: DC
  Administered 2012-07-23 – 2012-07-25 (×3): 30 mg via ORAL
  Filled 2012-07-22 (×3): qty 1

## 2012-07-22 MED ORDER — ACETAMINOPHEN 10 MG/ML IV SOLN
1000.0000 mg | Freq: Four times a day (QID) | INTRAVENOUS | Status: AC
  Administered 2012-07-22 – 2012-07-23 (×4): 1000 mg via INTRAVENOUS
  Filled 2012-07-22 (×4): qty 100

## 2012-07-22 MED ORDER — PHENYLEPHRINE HCL 10 MG/ML IJ SOLN
INTRAMUSCULAR | Status: DC | PRN
  Administered 2012-07-22: 80 ug via INTRAVENOUS
  Administered 2012-07-22: 40 ug via INTRAVENOUS
  Administered 2012-07-22: 80 ug via INTRAVENOUS

## 2012-07-22 MED ORDER — DEXAMETHASONE SODIUM PHOSPHATE 10 MG/ML IJ SOLN
6.0000 mg | Freq: Four times a day (QID) | INTRAMUSCULAR | Status: AC
  Administered 2012-07-22 – 2012-07-23 (×4): 6 mg via INTRAVENOUS
  Filled 2012-07-22 (×2): qty 0.6
  Filled 2012-07-22: qty 1
  Filled 2012-07-22 (×2): qty 0.6

## 2012-07-22 MED ORDER — ROCURONIUM BROMIDE 100 MG/10ML IV SOLN
INTRAVENOUS | Status: DC | PRN
  Administered 2012-07-22 (×3): 10 mg via INTRAVENOUS
  Administered 2012-07-22: 50 mg via INTRAVENOUS

## 2012-07-22 MED ORDER — GENTAMICIN IN SALINE 1.6-0.9 MG/ML-% IV SOLN
80.0000 mg | INTRAVENOUS | Status: AC
  Filled 2012-07-22: qty 50

## 2012-07-22 MED ORDER — ONDANSETRON HCL 4 MG PO TABS
4.0000 mg | ORAL_TABLET | Freq: Four times a day (QID) | ORAL | Status: DC
  Administered 2012-07-23 – 2012-07-24 (×4): 4 mg via ORAL
  Filled 2012-07-22 (×8): qty 2

## 2012-07-22 MED ORDER — LACTATED RINGERS IV SOLN
INTRAVENOUS | Status: DC | PRN
  Administered 2012-07-22 (×2): via INTRAVENOUS

## 2012-07-22 MED ORDER — FENTANYL CITRATE 0.05 MG/ML IJ SOLN
INTRAMUSCULAR | Status: DC | PRN
  Administered 2012-07-22 (×7): 50 ug via INTRAVENOUS

## 2012-07-22 MED ORDER — LIDOCAINE HCL (CARDIAC) 20 MG/ML IV SOLN
INTRAVENOUS | Status: DC | PRN
  Administered 2012-07-22: 100 mg via INTRAVENOUS

## 2012-07-22 MED ORDER — SODIUM CHLORIDE 0.9 % IR SOLN
Status: DC | PRN
  Administered 2012-07-22 (×2)

## 2012-07-22 MED ORDER — DEXAMETHASONE SODIUM PHOSPHATE 4 MG/ML IJ SOLN
4.0000 mg | Freq: Three times a day (TID) | INTRAMUSCULAR | Status: DC
  Filled 2012-07-22 (×2): qty 1

## 2012-07-22 MED ORDER — NEOSTIGMINE METHYLSULFATE 1 MG/ML IJ SOLN
INTRAMUSCULAR | Status: DC | PRN
  Administered 2012-07-22: 4 mg via INTRAVENOUS

## 2012-07-22 MED ORDER — MIDAZOLAM HCL 5 MG/5ML IJ SOLN
INTRAMUSCULAR | Status: DC | PRN
  Administered 2012-07-22: 2 mg via INTRAVENOUS

## 2012-07-22 MED ORDER — ARTIFICIAL TEARS OP OINT
TOPICAL_OINTMENT | OPHTHALMIC | Status: DC | PRN
  Administered 2012-07-22: 1 via OPHTHALMIC

## 2012-07-22 MED ORDER — MORPHINE SULFATE 2 MG/ML IJ SOLN
INTRAMUSCULAR | Status: AC
  Filled 2012-07-22: qty 1

## 2012-07-22 MED ORDER — ESCITALOPRAM OXALATE 20 MG PO TABS: 20.0000 mg | ORAL_TABLET | Freq: Every day | ORAL | Status: DC

## 2012-07-22 MED ORDER — ACETAMINOPHEN 10 MG/ML IV SOLN
INTRAVENOUS | Status: AC
  Administered 2012-07-22: 1000 mg via INTRAVENOUS
  Filled 2012-07-22: qty 100

## 2012-07-22 MED ORDER — DEXAMETHASONE SODIUM PHOSPHATE 4 MG/ML IJ SOLN
4.0000 mg | Freq: Four times a day (QID) | INTRAMUSCULAR | Status: DC
  Administered 2012-07-23 – 2012-07-24 (×3): 4 mg via INTRAVENOUS
  Filled 2012-07-22 (×4): qty 1

## 2012-07-22 SURGICAL SUPPLY — 80 items
ADH SKN CLS APL DERMABOND .7 (GAUZE/BANDAGES/DRESSINGS)
ADH SKN CLS LQ APL DERMABOND (GAUZE/BANDAGES/DRESSINGS) ×1
APL SKNCLS STERI-STRIP NONHPOA (GAUZE/BANDAGES/DRESSINGS) ×1
APL SRG 60D 8 XTD TIP BNDBL (TIP) ×1
BENZOIN TINCTURE PRP APPL 2/3 (GAUZE/BANDAGES/DRESSINGS) ×1 IMPLANT
BLADE SURG ROTATE 9660 (MISCELLANEOUS) ×4 IMPLANT
BLADE ULTRA TIP 2M (BLADE) ×2 IMPLANT
BRUSH SCRUB EZ 1% IODOPHOR (MISCELLANEOUS) ×2 IMPLANT
BUR ACORN 6.0 PRECISION (BURR) ×2 IMPLANT
BUR ACRON 5.0MM COATED (BURR) IMPLANT
BUR MATCHSTICK NEURO 3.0 LAGG (BURR) ×1 IMPLANT
CANISTER SUCTION 2500CC (MISCELLANEOUS) ×2 IMPLANT
CLIP TI MEDIUM 6 (CLIP) IMPLANT
CLOTH BEACON ORANGE TIMEOUT ST (SAFETY) ×2 IMPLANT
CONT SPEC 4OZ CLIKSEAL STRL BL (MISCELLANEOUS) ×2 IMPLANT
CORDS BIPOLAR (ELECTRODE) ×2 IMPLANT
COVER MAYO STAND STRL (DRAPES) ×1 IMPLANT
COVER TABLE BACK 60X90 (DRAPES) IMPLANT
DERMABOND ADHESIVE PROPEN (GAUZE/BANDAGES/DRESSINGS) ×1
DERMABOND ADVANCED (GAUZE/BANDAGES/DRESSINGS)
DERMABOND ADVANCED .7 DNX12 (GAUZE/BANDAGES/DRESSINGS) ×2 IMPLANT
DERMABOND ADVANCED .7 DNX6 (GAUZE/BANDAGES/DRESSINGS) IMPLANT
DRAPE LAPAROTOMY 100X72 PEDS (DRAPES) ×2 IMPLANT
DRAPE MICROSCOPE LEICA (MISCELLANEOUS) ×2 IMPLANT
DRAPE WARM FLUID 44X44 (DRAPE) ×2 IMPLANT
DRSG EMULSION OIL 3X3 NADH (GAUZE/BANDAGES/DRESSINGS) IMPLANT
DURAGUARD 06CMX08CM ×1 IMPLANT
DURASEAL APPLICATOR TIP (TIP) ×1 IMPLANT
DURASEAL SPINE SEALANT 3ML (MISCELLANEOUS) ×2 IMPLANT
ELECT CAUTERY BLADE 6.4 (BLADE) ×1 IMPLANT
ELECT REM PT RETURN 9FT ADLT (ELECTROSURGICAL) ×2
ELECTRODE REM PT RTRN 9FT ADLT (ELECTROSURGICAL) ×1 IMPLANT
EXTENDED TIP AQPPLICATOR ×2 IMPLANT
GAUZE SPONGE 4X4 16PLY XRAY LF (GAUZE/BANDAGES/DRESSINGS) IMPLANT
GLOVE BIO SURGEON STRL SZ8.5 (GLOVE) ×2 IMPLANT
GLOVE BIOGEL PI IND STRL 8 (GLOVE) ×1 IMPLANT
GLOVE BIOGEL PI IND STRL 8.5 (GLOVE) IMPLANT
GLOVE BIOGEL PI INDICATOR 8 (GLOVE) ×3
GLOVE BIOGEL PI INDICATOR 8.5 (GLOVE) ×1
GLOVE ECLIPSE 7.5 STRL STRAW (GLOVE) ×4 IMPLANT
GLOVE EXAM NITRILE LRG STRL (GLOVE) IMPLANT
GLOVE EXAM NITRILE MD LF STRL (GLOVE) IMPLANT
GLOVE EXAM NITRILE XL STR (GLOVE) IMPLANT
GLOVE EXAM NITRILE XS STR PU (GLOVE) IMPLANT
GLOVE SS BIOGEL STRL SZ 8 (GLOVE) IMPLANT
GLOVE SUPERSENSE BIOGEL SZ 8 (GLOVE) ×2
GLOVE SURG SS PI 8.0 STRL IVOR (GLOVE) ×3 IMPLANT
GOWN BRE IMP SLV AUR LG STRL (GOWN DISPOSABLE) IMPLANT
GOWN BRE IMP SLV AUR XL STRL (GOWN DISPOSABLE) ×2 IMPLANT
GOWN STRL REIN 2XL LVL4 (GOWN DISPOSABLE) ×2 IMPLANT
KIT BASIN OR (CUSTOM PROCEDURE TRAY) ×2 IMPLANT
KIT ROOM TURNOVER OR (KITS) ×2 IMPLANT
METHYLENE BLUE IMPLANT
NDL SPNL 22GX3.5 QUINCKE BK (NEEDLE) ×1 IMPLANT
NEEDLE SPNL 22GX3.5 QUINCKE BK (NEEDLE) ×2 IMPLANT
NS IRRIG 1000ML POUR BTL (IV SOLUTION) ×5 IMPLANT
PACK CRANIOTOMY (CUSTOM PROCEDURE TRAY) ×2 IMPLANT
PAD ARMBOARD 7.5X6 YLW CONV (MISCELLANEOUS) ×8 IMPLANT
PATTIES SURGICAL .5 X.5 (GAUZE/BANDAGES/DRESSINGS) ×1 IMPLANT
PATTIES SURGICAL 1/4 X 3 (GAUZE/BANDAGES/DRESSINGS) IMPLANT
RUBBERBAND STERILE (MISCELLANEOUS) IMPLANT
SPONGE GAUZE 4X4 12PLY (GAUZE/BANDAGES/DRESSINGS) ×1 IMPLANT
SPONGE LAP 4X18 X RAY DECT (DISPOSABLE) IMPLANT
STAPLER SKIN PROX WIDE 3.9 (STAPLE) ×1 IMPLANT
STRIP CLOSURE SKIN 1/4X4 (GAUZE/BANDAGES/DRESSINGS) IMPLANT
SUT ETHILON 3 0 FSL (SUTURE) IMPLANT
SUT NURALON 4 0 TR CR/8 (SUTURE) ×4 IMPLANT
SUT PROLENE 6 0 BV (SUTURE) ×10 IMPLANT
SUT VIC AB 0 CT1 18XCR BRD8 (SUTURE) ×1 IMPLANT
SUT VIC AB 0 CT1 8-18 (SUTURE) ×4
SUT VIC AB 2-0 CP2 18 (SUTURE) ×3 IMPLANT
SUT VIC AB 3-0 SH 8-18 (SUTURE) IMPLANT
SYR 20ML ECCENTRIC (SYRINGE) ×2 IMPLANT
SYR CONTROL 10ML LL (SYRINGE) ×2 IMPLANT
TAPE CLOTH SURG 4X10 WHT LF (GAUZE/BANDAGES/DRESSINGS) ×1 IMPLANT
TOWEL OR 17X24 6PK STRL BLUE (TOWEL DISPOSABLE) ×1 IMPLANT
TOWEL OR 17X26 10 PK STRL BLUE (TOWEL DISPOSABLE) ×2 IMPLANT
TRAY FOLEY CATH 14FRSI W/METER (CATHETERS) ×1 IMPLANT
UNDERPAD 30X30 INCONTINENT (UNDERPADS AND DIAPERS) IMPLANT
WATER STERILE IRR 1000ML POUR (IV SOLUTION) ×2 IMPLANT

## 2012-07-22 NOTE — Anesthesia Postprocedure Evaluation (Signed)
  Anesthesia Post-op Note  Patient: [name]  Procedure(s) Performed: Procedure(s) with comments: SUBOCCIPITAL CRANIECTOMY CERVICAL LAMINECTOMY/DURAPLASTY (N/A) - Suboccipital craniectomy with upper cervial laminectomy with duroplasty   Patient Location: PACU  Anesthesia Type:General  Level of Consciousness: awake  Airway and Oxygen Therapy: Patient Spontanous Breathing  Post-op Pain: mild  Post-op Assessment: Post-op Vital signs reviewed  Post-op Vital Signs: Reviewed  Complications: No apparent anesthesia complications

## 2012-07-22 NOTE — H&P (Signed)
Subjective: Patient is a 32 y.o. female who is admitted for treatment of Chiari malformation. The Chiari malformation was diagnosed 6 months ago when the patient presented with a constellation of complaints to the emergency room. These were treated by the medical service, but as part of her workup CT and subsequently MRI were performed. We have continued to follow patient as an outpatient. She continues to have complaints of neck pain and headache. Also complaining of bilateral retroauricular pain, and discomfort, numbness, and tingling in her hands bilaterally. MRI scan of the cervical spine revealed no evidence of syringomyelia, but again revealed a Chiari malformation. The patient feels that her symptoms are significantly disruptive to her life and we discussed alternatives for care and management of Chiari malformation. After extensive discussion she wishes to proceed with surgical decompression via a suboccipital craniectomy, upper cervical laminectomy, and duraplasty, and is admitted for such.   Patient Active Problem List   Diagnosis Date Noted  . SVT (supraventricular tachycardia) 01/22/2012  . E coli bacteremia 01/22/2012  . Sepsis(995.91) 01/20/2012  . UTI (lower urinary tract infection) 01/20/2012  . Arnold-Chiari malformation, 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  . Headache 01/19/2012   Past Medical History  Diagnosis Date  . Depression   . GERD (gastroesophageal reflux disease)   . Borderline personality disorder   . Anxiety   . Anginal pain     pt. takes nitrostat- as needed, hasn't had since 04/2012, sees Dr. Sharyn Lull, last ekg was /w PCP- [facility]  . Asthma   . Meningitis     Oct. 2013  . Headache   . Neuromuscular disorder     muscle spasms-back & arms   . Anemia     h/o    Past Surgical History  Procedure Laterality Date  . Abdominal hysterectomy    . Peripherally inserted central catheter insertion      during event of Meningitis- then d/c'd to home /w & treated /w antibiotics   .  Tubal ligation    . Wisdom tooth extraction    . Vaginal delivery      x3    No prescriptions prior to admission   Allergies  Allergen Reactions  . Amoxicillin Anaphylaxis and Hives  . Imitrex (Sumatriptan Base) Anaphylaxis and Hives  . Penicillins Anaphylaxis and Hives    History  Substance Use Topics  . Smoking status: Never Smoker   . Smokeless tobacco: Not on file  . Alcohol Use: No    Family History  Problem Relation Age of Onset  . Heart attack Mother      Review of Systems A comprehensive review of systems was negative.  Objective: Vital signs in last 24 hours:    EXAM: Patient is a well-developed well-nourished black female in no acute distress. Neck is supple, without minimal discomfort on flexion and extension. Lungs are clear to auscultation , the patient has symmetrical respiratory excursion. Heart has a regular rate and rhythm normal S1 and S2 no murmur.   Abdomen is soft nontender nondistended bowel sounds are present. Extremity examination shows no clubbing cyanosis or edema. Mental status examination: Patient is awake, alert, oriented to name, [facility] in the current month and year. Speech is fluent, she has good comprehension. She follows commands briskly. Cranial nerve examination: Pupils are equal, round, and reactive to light, and about 3 mm bilaterally. EOM I. Facial sensation intact. Facial movement is symmetrical. Hearing is present bilaterally. Palatal movement is symmetrical. Shoulder shrug is symmetrical. Tongue is midline. Motor examination: 5/5  strength in the upper and lower extremities. Sensory examination: Intact to pinprick throughout. Reflex examination: Trace in the upper and lower extremities bilaterally. Toes are downgoing bilaterally. Gait and stance: Patient has a normal gait and stance.  Data Review:CBC    Component Value Date/Time   WBC 5.5 07/12/2012 1030   RBC 4.38 07/12/2012 1030   HGB 12.9 07/12/2012 1030   HCT 37.1  07/12/2012 1030   PLT 269 07/12/2012 1030   MCV 84.7 07/12/2012 1030   MCH 29.5 07/12/2012 1030   MCHC 34.8 07/12/2012 1030   RDW 12.9 07/12/2012 1030   LYMPHSABS 0.9 01/19/2012 1716   MONOABS 0.2 01/19/2012 1716   EOSABS 0.0 01/19/2012 1716   BASOSABS 0.0 01/19/2012 1716                          BMET    Component Value Date/Time   NA 137 07/12/2012 1030   K 3.9 07/12/2012 1030   CL 104 07/12/2012 1030   CO2 25 07/12/2012 1030   GLUCOSE 92 07/12/2012 1030   BUN 8 07/12/2012 1030   CREATININE 0.68 07/12/2012 1030   CALCIUM 9.5 07/12/2012 1030   GFRNONAA >90 07/12/2012 1030   GFRAA >90 07/12/2012 1030     Assessment/Plan: Patient with a Chiari malformation with complaints of neck pain and headache, as well as bilateral retroauricular pain, and discomfort, numbness, and tingling into her hands bilaterally. MRI reveals a Chiari mouth relation with impaction of the tonsils into the upper cervical canal, and compression of the cervical medullary junction. Patient is admitted now for decompression of the Chiari malformation via a suboccipital craniectomy, upper cervical laminectomy, and duraplasty. We discussed the nature and extent, and seriousness of the surgical procedure, and its risks including risks of infection, bleeding, possibly for transfusion, the risks of neurologic dysfunction including brainstem, spinal cord, and/or cranial nerve dysfunction that could cause paralysis, difficulties with hearing, speech, swallowing, breathing, movement of her extremities, and so on. We also discussed anesthetic risks of myocardial infarction, stroke, pneumonia, and death. We also discussed the risks associated with a duraplasty including the risk of poor healing, development of a pseudomeningocele, and possibly for further surgery. Understanding all this the patient wishes to proceed with surgery and admitted for such.   Hewitt Shorts, MD 07/22/2012 7:06 AM

## 2012-07-22 NOTE — Anesthesia Preprocedure Evaluation (Signed)
Anesthesia Evaluation  Patient identified by MRN, date of birth, ID band Patient awake    Reviewed: Allergy & Precautions, H&P , NPO status , Patient's Chart, lab work & pertinent test results  Airway Mallampati: II  Neck ROM: full    Dental   Pulmonary asthma ,          Cardiovascular + angina + dysrhythmias Supra Ventricular Tachycardia     Neuro/Psych  Headaches, Anxiety Depression Arnold-chiari malformation  Neuromuscular disease    GI/Hepatic GERD-  ,  Endo/Other  obese  Renal/GU      Musculoskeletal   Abdominal   Peds  Hematology   Anesthesia Other Findings   Reproductive/Obstetrics                           Anesthesia Physical Anesthesia Plan  ASA: II  Anesthesia Plan: General   Post-op Pain Management:    Induction: Intravenous  Airway Management Planned: Oral ETT  Additional Equipment:   Intra-op Plan:   Post-operative Plan: Extubation in OR  Informed Consent: I have reviewed the patients History and Physical, chart, labs and discussed the procedure including the risks, benefits and alternatives for the proposed anesthesia with the patient or authorized representative who has indicated his/her understanding and acceptance.     Plan Discussed with: CRNA and Surgeon  Anesthesia Plan Comments:         Anesthesia Quick Evaluation

## 2012-07-22 NOTE — Progress Notes (Signed)
Subjective: Patient resting in the ICU bed, complaining of some incisional discomfort.  Objective: Vital signs in last 24 hours: Filed Vitals:   07/22/12 1751 07/22/12 1756 07/22/12 1815 07/22/12 1900  BP:    114/62  Pulse: 83 76 79 83  Temp:  97 F (36.1 C) 97.2 F (36.2 C)   TempSrc:   Oral   Resp: 17 23 14 16   Height:   5\' 5"  (1.651 m)   Weight:   97.6 kg (215 lb 2.7 oz)   SpO2: 97% 98% 98% 97%    Physical Exam:  Dressing clean and dry. Awake and alert, oriented. Following commands. Moving all 4 extremities well.  Assessment/Plan: Stable following Chiari malformation decompression. Will leave n.p.o. overnight and reassess in a.m.   Hewitt Shorts, MD 07/22/2012, 8:14 PM

## 2012-07-22 NOTE — Anesthesia Procedure Notes (Signed)
Procedure Name: Intubation Date/Time: 07/22/2012 12:35 PM Performed by: Fransisca Kaufmann Pre-anesthesia Checklist: Patient identified, Emergency Drugs available, Suction available, Patient being monitored and Timeout performed Patient Re-evaluated:Patient Re-evaluated prior to inductionOxygen Delivery Method: Circle system utilized Preoxygenation: Pre-oxygenation with 100% oxygen Intubation Type: IV induction Laryngoscope Size: Miller and 2 Grade View: Grade I Tube type: Oral Tube size: 7.5 mm Number of attempts: 1 Airway Equipment and Method: Stylet Placement Confirmation: ETT inserted through vocal cords under direct vision,  positive ETCO2 and breath sounds checked- equal and bilateral Secured at: 22 cm Tube secured with: Tape Dental Injury: Teeth and Oropharynx as per pre-operative assessment

## 2012-07-22 NOTE — Op Note (Signed)
07/22/2012  5:16 PM  PATIENT:  [name]  32 y.o. female  PRE-OPERATIVE DIAGNOSIS:  chiari malformation, cervicalgia, headache  POST-OPERATIVE DIAGNOSIS:  chiari malformation, cervicalgia, headache  PROCEDURE:  Procedure(s): SUBOCCIPITAL CRANIECTOMY CERVICAL LAMINECTOMY/DURAPLASTY:  Suboccipital craniectomy, C1 cervical laminectomy, partial C2 cervical laminectomy, duraplasty with dura guard, using the operating microscope, and micro-section microsurgical technique  SURGEON:  Surgeon(s): Hewitt Shorts, MD Cristi Loron, MD  ASSISTANTS: Tressie Stalker M.D.  ANESTHESIA:   general  EBL:  Total I/O In: 2550 [I.V.:2300; IV Piggyback:250] Out: 560 [Urine:260; Blood:300]  BLOOD ADMINISTERED:none  COUNT:  Correct per nursing staff  DICTATION: Patient was brought the operating room, placed under general endotracheal anesthesia. Patient was placed in a 3 pin Mayfield head holder and turned to a prone position. The exit region was shaved, and then the occipital region, posterior neck, and upper back were prepped with Betadine soap and solution and draped in a sterile fashion. The midline was infiltrated with local anesthetic with epinephrine. Midline incision made carried down to subcutaneous tissue. Bipolar cautery and electrocautery were used to maintain hemostasis.  Dissection was carried down to the midline fascia, and the occiput, the posterior ring of C1, and the spinous process and lamina of C2 were exposed. Self-retaining retractors were placed. Sub-occipital craniectomy and C1 and superior C2 laminectomy was performed using the high-speed drill, Kerrison punches, and the double-action rongeur. There was a thick soft tissue fascial band at the occiput-atlas junction that was freed up from the underlying dura and removed, exposing the dura. The operating microscope was draped and brought to the field to provide additional magnification, illumination, and visualization, and  the remainder the decompression performed using microdissection and microsurgical technique. We carefully opened the dura in a Y-shaped fashion, beginning caudally, extending rostrally. The occipital sinus was coagulated with bipolar cautery, as well as bleeding points along the edges of the dura. The cerebellar tonsils were noted to be impacted to just below the caudal level of C1, however there was about a 7 mm interval between the lowest point of the cerebellar tonsil and the lowest extent of the dural opening, such that good decompression of the malformation was achieved. We then cut a piece of Dura-Guard into a triangular shape, to fit the dural opening. It was secured at each of the corners with a 6-0 Prolene interrupted suture. Additional 6-0 Prolene sutures were placed at the midpoint of the 2 long limbs. Then each of the limbs was closed with a running 6-0 Prolene suture. A Valsalva was performed after the dural closure was completed, and no CSF leakage was seen. We then gently injected DuraSeal over the suture lines. We then proceeded with closure. Paraspinal muscles were approximate interrupted undyed 1 Vicryl sutures. Deep fascia was closed with interrupted undyed 1 Vicryl sutures. Subcutaneous and subcuticular layer were closed with interrupted inverted 2-0 Vicryl sutures. Skin is approximate Dermabond. The was dressed with sterile gauze and Hypafix. Following surgery the patient was turned back to a supine position, the 3 pin Mayfield head holder was removed, and the patient was to be reversed and the anesthetic, and transferred to the recovery room for further care.  PLAN OF CARE: Admit to inpatient   PATIENT DISPOSITION:  PACU - hemodynamically stable.   Delay start of Pharmacological VTE agent (>24hrs) due to surgical blood loss or risk of bleeding:  yes

## 2012-07-22 NOTE — Transfer of Care (Signed)
Immediate Anesthesia Transfer of Care Note  Patient: [name]  Procedure(s) Performed: Procedure(s) with comments: SUBOCCIPITAL CRANIECTOMY CERVICAL LAMINECTOMY/DURAPLASTY (N/A) - Suboccipital craniectomy with upper cervial laminectomy with duroplasty   Patient Location: PACU  Anesthesia Type:General  Level of Consciousness: sedated  Airway & Oxygen Therapy: Patient Spontanous Breathing and Patient connected to nasal cannula oxygen  Post-op Assessment: Report given to PACU RN, Post -op Vital signs reviewed and stable and Patient moving all extremities X 4  Post vital signs: Reviewed and stable  Complications: No apparent anesthesia complications

## 2012-07-22 NOTE — Preoperative (Signed)
Beta Blockers   Reason not to administer Beta Blockers:Not Applicable 

## 2012-07-23 LAB — CBC
HCT: 32.5 % — ABNORMAL LOW (ref 36.0–46.0)
Hemoglobin: 11.1 g/dL — ABNORMAL LOW (ref 12.0–15.0)
MCH: 28.7 pg (ref 26.0–34.0)
MCHC: 34.2 g/dL (ref 30.0–36.0)
MCV: 84 fL (ref 78.0–100.0)
Platelets: 228 10*3/uL (ref 150–400)
RBC: 3.87 MIL/uL (ref 3.87–5.11)
RDW: 13.3 % (ref 11.5–15.5)
WBC: 13.1 10*3/uL — ABNORMAL HIGH (ref 4.0–10.5)

## 2012-07-23 LAB — BASIC METABOLIC PANEL
BUN: 5 mg/dL — ABNORMAL LOW (ref 6–23)
CO2: 24 mEq/L (ref 19–32)
Calcium: 8.8 mg/dL (ref 8.4–10.5)
Chloride: 102 mEq/L (ref 96–112)
Creatinine, Ser: 0.54 mg/dL (ref 0.50–1.10)
GFR calc Af Amer: >90 mL/min (ref >90)
GFR calc non Af Amer: >90 mL/min (ref >90)
Glucose, Bld: 147 mg/dL — ABNORMAL HIGH (ref 70–99)
Potassium: 4 mEq/L (ref 3.5–5.1)
Sodium: 136 mEq/L (ref 135–145)

## 2012-07-23 MED ORDER — KETOROLAC TROMETHAMINE 30 MG/ML IJ SOLN
30.0000 mg | Freq: Four times a day (QID) | INTRAMUSCULAR | Status: DC | PRN
  Administered 2012-07-23 – 2012-07-25 (×4): 30 mg via INTRAVENOUS
  Filled 2012-07-23: qty 2
  Filled 2012-07-23 (×4): qty 1

## 2012-07-23 MED ORDER — HYDROCODONE-ACETAMINOPHEN 5-325 MG PO TABS
1.0000 | ORAL_TABLET | ORAL | Status: DC | PRN
  Administered 2012-07-23 – 2012-07-24 (×4): 2 via ORAL
  Filled 2012-07-23 (×4): qty 2

## 2012-07-23 MED ORDER — KETOROLAC TROMETHAMINE 60 MG/2ML IM SOLN
60.0000 mg | Freq: Once | INTRAMUSCULAR | Status: AC
  Administered 2012-07-23: 60 mg via INTRAMUSCULAR
  Filled 2012-07-23: qty 2

## 2012-07-23 NOTE — Progress Notes (Signed)
Subjective: Patient sitting up in bed, without complaints. Denies nausea.  Objective: Vital signs in last 24 hours: Filed Vitals:   07/23/12 0500 07/23/12 0600 07/23/12 0700 07/23/12 0813  BP: 110/61 104/47 109/56   Pulse: 86 80 74   Temp:    98.3 F (36.8 C)  TempSrc:    Axillary  Resp: 17 15 21    Height:      Weight:      SpO2: 97% 96% 97%     Intake/Output from previous day: 03/31 0701 - 04/01 0700 In: 4998.3 [I.V.:4548.3; IV Piggyback:450] Out: 1685 [Urine:1385; Blood:300] Intake/Output this shift:    Physical Exam:  Awake alert, oriented. Following commands. Moving all 4 extremities well. Dressing clean and dry.  CBC  Recent Labs  07/23/12 0500  WBC 13.1*  HGB 11.1*  HCT 32.5*  PLT 228   BMET  Recent Labs  07/23/12 0500  NA 136  K 4.0  CL 102  CO2 24  GLUCOSE 147*  BUN 5*  CREATININE 0.54  CALCIUM 8.8    Assessment/Plan: We'll discontinue Foley and a line, begin out of bed and progress to ambulation in ICU. We'll begin clear liquids. We'll begin by mouth analgesics.   Hewitt Shorts, MD 07/23/2012, 8:17 AM

## 2012-07-23 NOTE — Progress Notes (Signed)
UR COMPLETED  

## 2012-07-23 NOTE — Plan of Care (Signed)
Problem: Consults Goal: Diagnosis - Craniotomy Outcome: Completed/Met Date Met:  07/23/12 Craniotomy for chiari malformation

## 2012-07-24 ENCOUNTER — Encounter (HOSPITAL_COMMUNITY): Payer: Self-pay | Admitting: Neurosurgery

## 2012-07-24 MED ORDER — POLYETHYLENE GLYCOL 3350 17 G PO PACK
17.0000 g | PACK | Freq: Every day | ORAL | Status: DC
  Administered 2012-07-24 – 2012-07-25 (×2): 17 g via ORAL
  Filled 2012-07-24 (×2): qty 1

## 2012-07-24 MED ORDER — ACETAMINOPHEN 325 MG PO TABS
325.0000 mg | ORAL_TABLET | ORAL | Status: DC | PRN
  Administered 2012-07-24: 650 mg via ORAL
  Filled 2012-07-24: qty 2

## 2012-07-24 MED ORDER — DEXAMETHASONE 4 MG PO TABS
4.0000 mg | ORAL_TABLET | Freq: Three times a day (TID) | ORAL | Status: DC
  Administered 2012-07-24 – 2012-07-25 (×3): 4 mg via ORAL
  Filled 2012-07-24 (×6): qty 1

## 2012-07-24 MED ORDER — ONDANSETRON HCL 4 MG/2ML IJ SOLN: 4.0000 mg | Freq: Four times a day (QID) | INTRAMUSCULAR | Status: DC | PRN

## 2012-07-24 MED ORDER — ONDANSETRON HCL 4 MG PO TABS: 4.0000 mg | ORAL_TABLET | Freq: Four times a day (QID) | ORAL | Status: DC | PRN

## 2012-07-24 NOTE — Progress Notes (Signed)
Subjective:  Patient is sitting up in chair. She's been up and then he actively. She is taking well by mouth. She has good relief of her pain and discomfort. Dressing is clean dry.  Objective: Vital signs in last 24 hours: Filed Vitals:   07/24/12 0600 07/24/12 0700 07/24/12 0800 07/24/12 0900  BP: 112/71 123/78 128/77 119/67  Pulse: 57 52 69 69  Temp:  97.8 F (36.6 C)    TempSrc:  Oral    Resp: 14 16 19 17   Height:      Weight:      SpO2: 96% 98% 98% 96%    Intake/Output from previous day: 04/01 0701 - 04/02 0700 In: 3760 [P.O.:1100; I.V.:2460; IV Piggyback:200] Out: 1000 [Urine:1000] Intake/Output this shift: Total I/O In: 300 [I.V.:300] Out: -   Physical Exam:  Awake alert, oriented. Moving all 4 extremities well.   CBC  Recent Labs  07/23/12 0500  WBC 13.1*  HGB 11.1*  HCT 32.5*  PLT 228   BMET  Recent Labs  07/23/12 0500  NA 136  K 4.0  CL 102  CO2 24  GLUCOSE 147*  BUN 5*  CREATININE 0.54  CALCIUM 8.8    Assessment/Plan: Making good progress, we'll transfer to 3500. We'll change Decadron to by mouth, and continue taper.  Encouraged to ambulate.    Hewitt Shorts, MD 07/24/2012, 10:53 AM

## 2012-07-25 NOTE — Discharge Summary (Signed)
Physician Discharge Summary  Patient ID: [name] MRN: [id number] DOB/AGE: 1980-07-04 31 y.o.  Admit date: 07/22/2012 Discharge date: 07/25/2012  Admission Diagnoses:  chiari malformation, cervicalgia, headache  Discharge Diagnoses:  chiari malformation, cervicalgia, headache  Discharged Condition: good  Hospital Course:  Patient was admitted underwent a Chiari malformation decompression. Postoperatively she was initially managed in the intensive care. She made good progress. She's been up and living. She's had good relief of her neck pain and headache, although she has had some incisional discomfort. She was treated with a tapering Decadron schedule, and is being discharged to home on a continuing taper. She is prescribed 4 mg tablets to be taken 1 tablet twice a day for 2 days, then half tablet twice a day for 2 days, then half a tablet daily for 2 days, then half a tablet Q0 day x2 doses, and then discontinue. She declined a prescription for pain medication. She's been given instructions regarding wound care and activities. She is to return for followup with me in 3 weeks.  Discharge Exam: Blood pressure 114/74, pulse 48, temperature 99.1 F (37.3 C), temperature source Oral, resp. rate 16, height 5\' 5"  (1.651 m), weight 97.6 kg (215 lb 2.7 oz), SpO2 93.00%.  Disposition: Home     Medication List    TAKE these medications       acetaminophen 500 MG tablet  Commonly known as:  TYLENOL  Take 500 mg by mouth every 6 (six) hours as needed for pain.     albuterol 108 (90 BASE) MCG/ACT inhaler  Commonly known as:  PROVENTIL HFA;VENTOLIN HFA  Inhale 2 puffs into the lungs every 6 (six) hours as needed for shortness of breath.     ALLI PO  Take 1 capsule by mouth 3 (three) times daily.     cetirizine 10 MG tablet  Commonly known as:  ZYRTEC  Take 10 mg by mouth daily as needed.     cyclobenzaprine 5 MG tablet  Commonly known as:  FLEXERIL  Take 5 mg by mouth at bedtime  as needed for muscle spasms.     escitalopram 20 MG tablet  Commonly known as:  LEXAPRO  Take 20 mg by mouth daily before breakfast. Takes with 10mg  to =30mg      escitalopram 10 MG tablet  Commonly known as:  LEXAPRO  Take 10 mg by mouth daily. Takes with 20mg  to =30mg      ibuprofen 200 MG tablet  Commonly known as:  ADVIL,MOTRIN  Take 800 mg by mouth every 8 (eight) hours as needed for pain.     nitroGLYCERIN 0.4 MG SL tablet  Commonly known as:  NITROSTAT  Place 0.4 mg under the tongue every 5 (five) minutes as needed for chest pain.     nystatin-triamcinolone cream  Commonly known as:  MYCOLOG II  Apply 1 application topically 2 (two) times daily as needed (rash).     omeprazole 40 MG capsule  Commonly known as:  PRILOSEC  Take 40 mg by mouth daily with supper.     PATADAY OP  Place 1 drop into both eyes daily before breakfast.     tiZANidine 4 MG tablet  Commonly known as:  ZANAFLEX  Take 4 mg by mouth at bedtime.         Signed: Hewitt Shorts, MD 07/25/2012, 1:30 PM

## 2012-07-25 NOTE — Progress Notes (Signed)
Pt doing well. Pt given D/C instructions with Rx, verbal understanding given by Pt. Pt D/C'd home via wheelchair @ 1425 per MD order. Rema Fendt, RN

## 2012-07-31 ENCOUNTER — Encounter (HOSPITAL_COMMUNITY): Payer: Self-pay | Admitting: Emergency Medicine

## 2012-07-31 DIAGNOSIS — J45901 Unspecified asthma with (acute) exacerbation: Secondary | ICD-10-CM | POA: Insufficient documentation

## 2012-07-31 DIAGNOSIS — Z8669 Personal history of other diseases of the nervous system and sense organs: Secondary | ICD-10-CM | POA: Insufficient documentation

## 2012-07-31 DIAGNOSIS — F603 Borderline personality disorder: Secondary | ICD-10-CM | POA: Insufficient documentation

## 2012-07-31 DIAGNOSIS — F3289 Other specified depressive episodes: Secondary | ICD-10-CM | POA: Insufficient documentation

## 2012-07-31 DIAGNOSIS — Z862 Personal history of diseases of the blood and blood-forming organs and certain disorders involving the immune mechanism: Secondary | ICD-10-CM | POA: Insufficient documentation

## 2012-07-31 DIAGNOSIS — K219 Gastro-esophageal reflux disease without esophagitis: Secondary | ICD-10-CM | POA: Insufficient documentation

## 2012-07-31 DIAGNOSIS — R0602 Shortness of breath: Secondary | ICD-10-CM | POA: Insufficient documentation

## 2012-07-31 DIAGNOSIS — F329 Major depressive disorder, single episode, unspecified: Secondary | ICD-10-CM | POA: Insufficient documentation

## 2012-07-31 DIAGNOSIS — F411 Generalized anxiety disorder: Secondary | ICD-10-CM | POA: Insufficient documentation

## 2012-07-31 DIAGNOSIS — Z87798 Personal history of other (corrected) congenital malformations: Secondary | ICD-10-CM | POA: Insufficient documentation

## 2012-07-31 DIAGNOSIS — R51 Headache: Secondary | ICD-10-CM | POA: Insufficient documentation

## 2012-07-31 DIAGNOSIS — Z8679 Personal history of other diseases of the circulatory system: Secondary | ICD-10-CM | POA: Insufficient documentation

## 2012-07-31 DIAGNOSIS — I820 Budd-Chiari syndrome: Secondary | ICD-10-CM | POA: Insufficient documentation

## 2012-07-31 DIAGNOSIS — R110 Nausea: Secondary | ICD-10-CM | POA: Insufficient documentation

## 2012-07-31 DIAGNOSIS — Z79899 Other long term (current) drug therapy: Secondary | ICD-10-CM | POA: Insufficient documentation

## 2012-07-31 NOTE — ED Notes (Signed)
PT. REPORTS BRAIN SURGERY BY DR. Ronney Lion LAST 3/31/ 2014 - CHIARI  MALFORMATION , PRESENTS WITH PAIN AT SURGICAL SITE AT BACK OF HEAD WITH DIZZINESS AND SOB , PAIN UNRELIEVED BY PRESCRIPTION PAIN MEDICATIONS .

## 2012-08-01 ENCOUNTER — Emergency Department (HOSPITAL_COMMUNITY)
Admission: EM | Admit: 2012-08-01 | Discharge: 2012-08-01 | Disposition: A | Discharge status: Home / Self Care | Payer: Medicaid Other | Attending: Emergency Medicine | Admitting: Emergency Medicine

## 2012-08-01 ENCOUNTER — Encounter (HOSPITAL_COMMUNITY): Payer: Self-pay | Admitting: Radiology

## 2012-08-01 ENCOUNTER — Emergency Department (HOSPITAL_COMMUNITY): Payer: Medicaid Other

## 2012-08-01 DIAGNOSIS — R51 Headache: Secondary | ICD-10-CM

## 2012-08-01 DIAGNOSIS — I820 Budd-Chiari syndrome: Secondary | ICD-10-CM

## 2012-08-01 HISTORY — DX: Reserved for concepts with insufficient information to code with codable children: IMO0002

## 2012-08-01 LAB — CBC WITH DIFFERENTIAL/PLATELET
Basophils Absolute: 0 10*3/uL (ref 0.0–0.1)
Basophils Relative: 0 % (ref 0–1)
Eosinophils Absolute: 0.2 10*3/uL (ref 0.0–0.7)
Eosinophils Relative: 1 % (ref 0–5)
HCT: 33 % — ABNORMAL LOW (ref 36.0–46.0)
Hemoglobin: 11.1 g/dL — ABNORMAL LOW (ref 12.0–15.0)
Lymphocytes Relative: 32 % (ref 12–46)
Lymphs Abs: 4.4 10*3/uL — ABNORMAL HIGH (ref 0.7–4.0)
MCH: 28.8 pg (ref 26.0–34.0)
MCHC: 33.6 g/dL (ref 30.0–36.0)
MCV: 85.5 fL (ref 78.0–100.0)
Monocytes Absolute: 1.5 10*3/uL — ABNORMAL HIGH (ref 0.1–1.0)
Monocytes Relative: 11 % (ref 3–12)
Neutro Abs: 7.6 10*3/uL (ref 1.7–7.7)
Neutrophils Relative %: 56 % (ref 43–77)
Platelets: 282 10*3/uL (ref 150–400)
RBC: 3.86 MIL/uL — ABNORMAL LOW (ref 3.87–5.11)
RDW: 13.9 % (ref 11.5–15.5)
WBC: 13.7 10*3/uL — ABNORMAL HIGH (ref 4.0–10.5)

## 2012-08-01 LAB — BASIC METABOLIC PANEL
BUN: 8 mg/dL (ref 6–23)
CO2: 28 mEq/L (ref 19–32)
Calcium: 8.5 mg/dL (ref 8.4–10.5)
Chloride: 100 mEq/L (ref 96–112)
Creatinine, Ser: 0.61 mg/dL (ref 0.50–1.10)
GFR calc Af Amer: >90 mL/min (ref >90)
GFR calc non Af Amer: >90 mL/min (ref >90)
Glucose, Bld: 91 mg/dL (ref 70–99)
Potassium: 3.3 mEq/L — ABNORMAL LOW (ref 3.5–5.1)
Sodium: 135 mEq/L (ref 135–145)

## 2012-08-01 LAB — TROPONIN I: Troponin I: <0.3 ng/mL (ref <0.30)

## 2012-08-01 MED ORDER — HYDROMORPHONE HCL PF 1 MG/ML IJ SOLN
1.0000 mg | Freq: Once | INTRAMUSCULAR | Status: AC
  Administered 2012-08-01: 1 mg via INTRAVENOUS
  Filled 2012-08-01: qty 1

## 2012-08-01 MED ORDER — POTASSIUM CHLORIDE CRYS ER 20 MEQ PO TBCR
20.0000 meq | EXTENDED_RELEASE_TABLET | Freq: Once | ORAL | Status: AC
  Administered 2012-08-01: 20 meq via ORAL
  Filled 2012-08-01: qty 1

## 2012-08-01 MED ORDER — ONDANSETRON HCL 4 MG PO TABS: 4.0000 mg | ORAL_TABLET | Freq: Four times a day (QID) | ORAL | Status: DC

## 2012-08-01 MED ORDER — METOCLOPRAMIDE HCL 5 MG/ML IJ SOLN
10.0000 mg | Freq: Once | INTRAMUSCULAR | Status: AC
  Administered 2012-08-01: 10 mg via INTRAVENOUS
  Filled 2012-08-01: qty 2

## 2012-08-01 MED ORDER — OXYCODONE-ACETAMINOPHEN 5-325 MG PO TABS: 2.0000 | ORAL_TABLET | ORAL | Status: DC | PRN

## 2012-08-01 MED ORDER — SODIUM CHLORIDE 0.9 % IV BOLUS (SEPSIS)
1000.0000 mL | Freq: Once | INTRAVENOUS | Status: AC
  Administered 2012-08-01: 1000 mL via INTRAVENOUS

## 2012-08-01 NOTE — Progress Notes (Signed)
Head CT reviewed. Normal post op appearance.

## 2012-08-01 NOTE — ED Provider Notes (Addendum)
History     CSN: [id number]  Arrival date & time 07/31/12  2241   First MD Initiated Contact with Patient 08/01/12 0022      Chief Complaint  Patient presents with  . Headache  . Shortness of Breath    (Consider location/radiation/quality/duration/timing/severity/associated sxs/prior treatment) The history is provided by the patient.  [name] is a 32 y.o. female history of reflux, Bud Chiari malformation s/p craniotomy 10 days ago here with headache, dizziness, nausea, and SOB. For the last 2 days she's been having posterior headaches as well as lightheadedness and dizziness. She also has some intermittent nausea and shortness of breath. Denies any chest pain or fevers. She takes tramadol and Motrin without any improvement.    Past Medical History  Diagnosis Date  . Depression   . GERD (gastroesophageal reflux disease)   . Borderline personality disorder   . Anxiety   . Anginal pain     pt. takes nitrostat- as needed, hasn't had since 04/2012, sees Dr. Sharyn Lull, last ekg was /w PCP- [facility]  . Asthma   . Meningitis     Oct. 2013  . Headache   . Neuromuscular disorder     muscle spasms-back & arms   . Anemia     h/o  . Chiari malformation     Past Surgical History  Procedure Laterality Date  . Abdominal hysterectomy    . Peripherally inserted central catheter insertion      during event of Meningitis- then d/c'd to home /w & treated /w antibiotics   . Tubal ligation    . Wisdom tooth extraction    . Vaginal delivery      x3  . Suboccipital craniectomy cervical laminectomy N/A 07/22/2012    Procedure: SUBOCCIPITAL CRANIECTOMY CERVICAL LAMINECTOMY/DURAPLASTY;  Surgeon: Hewitt Shorts, MD;  Location: [facility];  Service: Neurosurgery;  Laterality: N/A;  Suboccipital craniectomy with upper cervial laminectomy with duroplasty     Family History  Problem Relation Age of Onset  . Heart attack Mother     History  Substance Use Topics  . Smoking status:  Never Smoker   . Smokeless tobacco: Not on file  . Alcohol Use: No    OB History   Grav Para Term Preterm Abortions TAB SAB Ect Mult Living                  Review of Systems  Respiratory: Positive for shortness of breath.   Gastrointestinal: Positive for nausea.  Neurological: Positive for headaches.  All other systems reviewed and are negative.    Allergies  Amoxicillin; Imitrex; and Penicillins  Home Medications   Current Outpatient Rx  Name  Route  Sig  Dispense  Refill  . acetaminophen (TYLENOL) 500 MG tablet   Oral   Take 500 mg by mouth every 6 (six) hours as needed for pain.         Marland Kitchen albuterol (PROVENTIL HFA;VENTOLIN HFA) 108 (90 BASE) MCG/ACT inhaler   Inhalation   Inhale 2 puffs into the lungs every 6 (six) hours as needed for shortness of breath.          . cetirizine (ZYRTEC) 10 MG tablet   Oral   Take 10 mg by mouth daily as needed for allergies.          . cyclobenzaprine (FLEXERIL) 5 MG tablet   Oral   Take 5 mg by mouth at bedtime as needed for muscle spasms.          Marland Kitchen  escitalopram (LEXAPRO) 10 MG tablet   Oral   Take 10 mg by mouth daily. Takes with 20mg  to =30mg          . escitalopram (LEXAPRO) 20 MG tablet   Oral   Take 20 mg by mouth daily before breakfast. Takes with 10mg  to =30mg          . ibuprofen (ADVIL,MOTRIN) 200 MG tablet   Oral   Take 800 mg by mouth every 8 (eight) hours as needed for pain.          Marland Kitchen omeprazole (PRILOSEC) 40 MG capsule   Oral   Take 40 mg by mouth daily with supper.          . nitroGLYCERIN (NITROSTAT) 0.4 MG SL tablet   Sublingual   Place 0.4 mg under the tongue every 5 (five) minutes as needed for chest pain.          Marland Kitchen Olopatadine HCl (PATADAY OP)   Both Eyes   Place 1 drop into both eyes daily before breakfast.          . tiZANidine (ZANAFLEX) 4 MG tablet   Oral   Take 4 mg by mouth at bedtime as needed.            BP 117/66  Pulse 105  Temp(Src) 98.6 F (37 C) (Oral)   Resp 20  SpO2 100%  Physical Exam  Nursing note and vitals reviewed. Constitutional: She is oriented to person, place, and time.  Uncomfortable, crying   HENT:  Mouth/Throat: Oropharynx is clear and moist.  Posterior scalp tenderness along the surgical scar. Surgical scar extends from base of skull to the cervical spine. No obvious infection of the surgical site.   Eyes: Conjunctivae are normal. Pupils are equal, round, and reactive to light.  Neck:  Dec ROM, surgical scar noted   Cardiovascular: Normal rate, regular rhythm and normal heart sounds.   Pulmonary/Chest: Effort normal and breath sounds normal. No respiratory distress. She has no wheezes. She has no rales.  Abdominal: Soft. Bowel sounds are normal. She exhibits no distension. There is no tenderness. There is no rebound and no guarding.  Musculoskeletal: Normal range of motion. She exhibits no edema.  Neurological: She is alert and oriented to person, place, and time. No cranial nerve deficit.  Nl strength and sensation throughout   Skin: Skin is warm and dry.  Psychiatric: She has a normal mood and affect. Her behavior is normal. Judgment and thought content normal.    ED Course  Procedures (including critical care time)  Labs Reviewed  CBC WITH DIFFERENTIAL - Abnormal; Notable for the following:    WBC 13.7 (*)    RBC 3.86 (*)    Hemoglobin 11.1 (*)    HCT 33.0 (*)    Lymphs Abs 4.4 (*)    Monocytes Absolute 1.5 (*)    All other components within normal limits  BASIC METABOLIC PANEL - Abnormal; Notable for the following:    Potassium 3.3 (*)    All other components within normal limits  TROPONIN I   Dg Chest 2 View  08/01/2012  *RADIOLOGY REPORT*  Clinical Data: Shortness of breath; recent surgery.  CHEST - 2 VIEW  Comparison: Chest radiograph performed 01/19/2012  Findings: The lungs are hypoexpanded.  Mild bibasilar airspace opacities likely reflect atelectasis, though mild pneumonia cannot be excluded.  No  pleural effusion or pneumothorax is seen.  The heart is normal in size; the mediastinal contour is within normal limits.  No acute osseous abnormalities are seen.  IMPRESSION: Lungs hypoexpanded.  Mild bibasilar airspace opacities likely reflect atelectasis given recent surgery, though mild pneumonia cannot be excluded.   Original Report Authenticated By: Tonia Ghent, M.D.    Ct Head Wo Contrast  08/01/2012  *RADIOLOGY REPORT*  Clinical Data:  Status post recent suboccipital craniotomy for Chiari malformation.  Pain at surgical site.  Dizziness and shortness of breath.  CT HEAD WITHOUT CONTRAST CT CERVICAL SPINE WITHOUT CONTRAST  Technique:  Multidetector CT imaging of the head and cervical spine was performed following the standard protocol without intravenous contrast.  Multiplanar CT image reconstructions of the cervical spine were also generated.  Comparison:  MRI of the cervical spine 06/24/2012.  CT HEAD  Findings: The patient is status post suboccipital craniotomy.  A fluid collection extending through the paraspinous muscles along the surgical tract is likely within normal limits following recent surgery.  No acute cortical infarct, hemorrhage, mass lesion is present.  The ventricles are of normal size.  No significant extra- axial fluid collection is present.  IMPRESSION:  1.  Postoperative fluid collection extending through the posterior cervical musculature is likely within normal limits following recent surgery. 2.  Status post suboccipital craniotomy. 3.  The CT appearance the brain is normal.  CT CERVICAL SPINE  Findings: The cervical spine is imaged from skull base through T1- 2.  The patient is status post suboccipital craniotomy.  C1 laminectomy has been performed.  A partial C2 laminectomy is noted. Postoperative fluid collection is noted in the midline, likely within normal limits following search recent surgery.  The remainder the cervical spine is unremarkable.  There is some straightening  of the normal cervical lordosis.  The anterior soft tissues are unremarkable.  IMPRESSION:  1.  Status postoperative craniotomy and C1 laminectomy. 2.  Large midline fluid collection is likely within normal limits following recent surgery. 3.  Straightening of the normal cervical lordosis.  This may be positional or related to muscle strain following recent surgery.   Original Report Authenticated By: Marin Roberts, M.D.    Ct Cervical Spine Wo Contrast  08/01/2012  *RADIOLOGY REPORT*  Clinical Data:  Status post recent suboccipital craniotomy for Chiari malformation.  Pain at surgical site.  Dizziness and shortness of breath.  CT HEAD WITHOUT CONTRAST CT CERVICAL SPINE WITHOUT CONTRAST  Technique:  Multidetector CT imaging of the head and cervical spine was performed following the standard protocol without intravenous contrast.  Multiplanar CT image reconstructions of the cervical spine were also generated.  Comparison:  MRI of the cervical spine 06/24/2012.  CT HEAD  Findings: The patient is status post suboccipital craniotomy.  A fluid collection extending through the paraspinous muscles along the surgical tract is likely within normal limits following recent surgery.  No acute cortical infarct, hemorrhage, mass lesion is present.  The ventricles are of normal size.  No significant extra- axial fluid collection is present.  IMPRESSION:  1.  Postoperative fluid collection extending through the posterior cervical musculature is likely within normal limits following recent surgery. 2.  Status post suboccipital craniotomy. 3.  The CT appearance the brain is normal.  CT CERVICAL SPINE  Findings: The cervical spine is imaged from skull base through T1- 2.  The patient is status post suboccipital craniotomy.  C1 laminectomy has been performed.  A partial C2 laminectomy is noted. Postoperative fluid collection is noted in the midline, likely within normal limits following search recent surgery.  The remainder  the cervical spine is  unremarkable.  There is some straightening of the normal cervical lordosis.  The anterior soft tissues are unremarkable.  IMPRESSION:  1.  Status postoperative craniotomy and C1 laminectomy. 2.  Large midline fluid collection is likely within normal limits following recent surgery. 3.  Straightening of the normal cervical lordosis.  This may be positional or related to muscle strain following recent surgery.   Original Report Authenticated By: Marin Roberts, M.D.      No diagnosis found.   Date: 08/01/2012  Rate: 94  Rhythm: normal sinus rhythm  QRS Axis: normal  Intervals: normal  ST/T Wave abnormalities: nonspecific ST changes  Conduction Disutrbances:none  Narrative Interpretation:   Old EKG Reviewed: unchanged    MDM  Gracilyn L Steury is a 32 y.o. female here with headache, SOB. Will need to r/o bleed vs post op changes. Will give pain meds and reassess. Will likely need neurosurgical consult.   1:40 AM I called Dr. Franky Macho from neurosurgery. He said that the CT findings are expected post op. She doesn't need MRI tonight. She can be given stronger narcotics and can f/u with her neurosurgeon this week. Shortness of breath resolved, EKG unremarkable, trop neg x 1 and not concerned for ACS or PE. Will d/c home on percocet and zofran.        Richardean Canal, MD 08/01/12 1610  Richardean Canal, MD 08/01/12 0400

## 2012-08-01 NOTE — ED Notes (Signed)
Pt discharged.Vital signs stable and GCS 15 

## 2012-08-05 ENCOUNTER — Emergency Department (HOSPITAL_COMMUNITY)
Admission: EM | Admit: 2012-08-05 | Discharge: 2012-08-05 | Disposition: A | Discharge status: Home / Self Care | Payer: Medicaid Other | Attending: Emergency Medicine | Admitting: Emergency Medicine

## 2012-08-05 ENCOUNTER — Encounter (HOSPITAL_COMMUNITY): Payer: Self-pay | Admitting: *Deleted

## 2012-08-05 DIAGNOSIS — K219 Gastro-esophageal reflux disease without esophagitis: Secondary | ICD-10-CM | POA: Insufficient documentation

## 2012-08-05 DIAGNOSIS — J45909 Unspecified asthma, uncomplicated: Secondary | ICD-10-CM | POA: Insufficient documentation

## 2012-08-05 DIAGNOSIS — F329 Major depressive disorder, single episode, unspecified: Secondary | ICD-10-CM | POA: Insufficient documentation

## 2012-08-05 DIAGNOSIS — Z8776 Personal history of (corrected) congenital malformations of integument, limbs and musculoskeletal system: Secondary | ICD-10-CM | POA: Insufficient documentation

## 2012-08-05 DIAGNOSIS — Z87768 Personal history of other specified (corrected) congenital malformations of integument, limbs and musculoskeletal system: Secondary | ICD-10-CM | POA: Insufficient documentation

## 2012-08-05 DIAGNOSIS — Z79899 Other long term (current) drug therapy: Secondary | ICD-10-CM | POA: Insufficient documentation

## 2012-08-05 DIAGNOSIS — F411 Generalized anxiety disorder: Secondary | ICD-10-CM | POA: Insufficient documentation

## 2012-08-05 DIAGNOSIS — Z862 Personal history of diseases of the blood and blood-forming organs and certain disorders involving the immune mechanism: Secondary | ICD-10-CM | POA: Insufficient documentation

## 2012-08-05 DIAGNOSIS — Z880 Allergy status to penicillin: Secondary | ICD-10-CM | POA: Insufficient documentation

## 2012-08-05 DIAGNOSIS — F603 Borderline personality disorder: Secondary | ICD-10-CM | POA: Insufficient documentation

## 2012-08-05 DIAGNOSIS — G709 Myoneural disorder, unspecified: Secondary | ICD-10-CM | POA: Insufficient documentation

## 2012-08-05 DIAGNOSIS — Z8661 Personal history of infections of the central nervous system: Secondary | ICD-10-CM | POA: Insufficient documentation

## 2012-08-05 DIAGNOSIS — F419 Anxiety disorder, unspecified: Secondary | ICD-10-CM

## 2012-08-05 DIAGNOSIS — F3289 Other specified depressive episodes: Secondary | ICD-10-CM | POA: Insufficient documentation

## 2012-08-05 DIAGNOSIS — Z8679 Personal history of other diseases of the circulatory system: Secondary | ICD-10-CM | POA: Insufficient documentation

## 2012-08-05 MED ORDER — ALPRAZOLAM 0.5 MG PO TABS: 0.5000 mg | ORAL_TABLET | Freq: Three times a day (TID) | ORAL | Status: DC | PRN

## 2012-08-05 NOTE — ED Notes (Addendum)
Pt states had brain surgery 2 weeks ago, yesterday fell froward on knees d/t being dizziness, weak, states felt like she was going to pass out, pt also states has hx of anxiety and panic attacks, which she has been more anxious lately, denies n/v/d, states yesterday did have abdominal pain.   Per pt: Three episode of diarrhea within one hour.  Pt is drinking a lot of water.

## 2012-08-05 NOTE — ED Provider Notes (Signed)
History     CSN: [id number]  Arrival date & time 08/05/12  1038   First MD Initiated Contact with Patient 08/05/12 1122      Chief Complaint  Patient presents with  . Dizziness  . Weakness  . Fall  . Anxiety    (Consider location/radiation/quality/duration/timing/severity/associated sxs/prior treatment) Patient is a 32 y.o. female presenting with anxiety. The history is provided by the patient (the pt complains of having anxiety recently.  she states she is not depressed just anxious). No language interpreter was used.  Anxiety This is a recurrent problem. The current episode started more than 2 days ago. The problem occurs constantly. The problem has not changed since onset.Pertinent negatives include no chest pain, no abdominal pain and no headaches. Nothing aggravates the symptoms. Nothing relieves the symptoms.    Past Medical History  Diagnosis Date  . Depression   . GERD (gastroesophageal reflux disease)   . Borderline personality disorder   . Anxiety   . Anginal pain     pt. takes nitrostat- as needed, hasn't had since 04/2012, sees Dr. Sharyn Lull, last ekg was /w PCP- [facility]  . Asthma   . Meningitis     Oct. 2013  . Headache   . Neuromuscular disorder     muscle spasms-back & arms   . Anemia     h/o  . Chiari malformation     Past Surgical History  Procedure Laterality Date  . Abdominal hysterectomy    . Peripherally inserted central catheter insertion      during event of Meningitis- then d/c'd to home /w & treated /w antibiotics   . Tubal ligation    . Wisdom tooth extraction    . Vaginal delivery      x3  . Suboccipital craniectomy cervical laminectomy N/A 07/22/2012    Procedure: SUBOCCIPITAL CRANIECTOMY CERVICAL LAMINECTOMY/DURAPLASTY;  Surgeon: Hewitt Shorts, MD;  Location: [facility];  Service: Neurosurgery;  Laterality: N/A;  Suboccipital craniectomy with upper cervial laminectomy with duroplasty     Family History  Problem Relation Age of  Onset  . Heart attack Mother     History  Substance Use Topics  . Smoking status: Never Smoker   . Smokeless tobacco: Not on file  . Alcohol Use: No    OB History   Grav Para Term Preterm Abortions TAB SAB Ect Mult Living                  Review of Systems  Constitutional: Negative for fatigue.  HENT: Negative for congestion, sinus pressure and ear discharge.   Eyes: Negative for discharge.  Respiratory: Negative for cough.   Cardiovascular: Negative for chest pain.  Gastrointestinal: Negative for abdominal pain and diarrhea.  Genitourinary: Negative for frequency and hematuria.  Musculoskeletal: Negative for back pain.  Skin: Negative for rash.  Neurological: Negative for seizures and headaches.  Psychiatric/Behavioral: Positive for agitation. Negative for hallucinations.    Allergies  Amoxicillin; Imitrex; Penicillins; Norco; Percocet; and Tramadol  Home Medications   Current Outpatient Rx  Name  Route  Sig  Dispense  Refill  . acetaminophen (TYLENOL) 500 MG tablet   Oral   Take 500 mg by mouth every 6 (six) hours as needed for pain.         Marland Kitchen albuterol (PROVENTIL HFA;VENTOLIN HFA) 108 (90 BASE) MCG/ACT inhaler   Inhalation   Inhale 2 puffs into the lungs every 6 (six) hours as needed for shortness of breath.          Marland Kitchen  cyclobenzaprine (FLEXERIL) 5 MG tablet   Oral   Take 5 mg by mouth at bedtime as needed for muscle spasms.          Marland Kitchen escitalopram (LEXAPRO) 10 MG tablet   Oral   Take 10 mg by mouth daily. Takes with 20mg  to =30mg          . escitalopram (LEXAPRO) 20 MG tablet   Oral   Take 20 mg by mouth daily before breakfast. Takes with 10mg  to =30mg          . ibuprofen (ADVIL,MOTRIN) 200 MG tablet   Oral   Take 800 mg by mouth every 8 (eight) hours as needed for pain.          Marland Kitchen omeprazole (PRILOSEC) 40 MG capsule   Oral   Take 40 mg by mouth daily with supper.          . ondansetron (ZOFRAN) 4 MG tablet   Oral   Take 1 tablet (4  mg total) by mouth every 6 (six) hours.   10 tablet   0   . tiZANidine (ZANAFLEX) 4 MG tablet   Oral   Take 4 mg by mouth at bedtime as needed.          . ALPRAZolam (XANAX) 0.5 MG tablet   Oral   Take 1 tablet (0.5 mg total) by mouth 3 (three) times daily as needed for anxiety.   20 tablet   0     BP 112/61  Pulse 101  Temp(Src) 98.6 F (37 C) (Oral)  Resp 18  SpO2 99%  Physical Exam  Constitutional: She is oriented to person, place, and time. She appears well-developed.  HENT:  Head: Normocephalic.  Eyes: Conjunctivae and EOM are normal. No scleral icterus.  Neck: Neck supple. No thyromegaly present.  Healing wound back of neck  Cardiovascular: Normal rate and regular rhythm.  Exam reveals no gallop and no friction rub.   No murmur heard. Pulmonary/Chest: No stridor. She has no wheezes. She has no rales. She exhibits no tenderness.  Abdominal: She exhibits no distension. There is no tenderness. There is no rebound.  Musculoskeletal: Normal range of motion. She exhibits no edema.  Lymphadenopathy:    She has no cervical adenopathy.  Neurological: She is oriented to person, place, and time. Coordination normal.  Skin: No rash noted. No erythema.  Psychiatric:  Pt anxious not suicidal or homicidal    ED Course  Procedures (including critical care time)  Labs Reviewed - No data to display No results found.   1. Anxiety       MDM          Benny Lennert, MD 08/05/12 [phone]

## 2012-08-05 NOTE — ED Notes (Signed)
Discharged by [facility], RN

## 2012-12-09 ENCOUNTER — Emergency Department (HOSPITAL_COMMUNITY)
Admission: EM | Admit: 2012-12-09 | Discharge: 2012-12-09 | Disposition: A | Discharge status: Home / Self Care | Payer: Medicaid Other | Attending: Emergency Medicine | Admitting: Emergency Medicine

## 2012-12-09 ENCOUNTER — Encounter (HOSPITAL_COMMUNITY): Payer: Self-pay | Admitting: Emergency Medicine

## 2012-12-09 DIAGNOSIS — Z3202 Encounter for pregnancy test, result negative: Secondary | ICD-10-CM | POA: Insufficient documentation

## 2012-12-09 DIAGNOSIS — Z8659 Personal history of other mental and behavioral disorders: Secondary | ICD-10-CM | POA: Insufficient documentation

## 2012-12-09 DIAGNOSIS — Z862 Personal history of diseases of the blood and blood-forming organs and certain disorders involving the immune mechanism: Secondary | ICD-10-CM | POA: Insufficient documentation

## 2012-12-09 DIAGNOSIS — Z79899 Other long term (current) drug therapy: Secondary | ICD-10-CM | POA: Insufficient documentation

## 2012-12-09 DIAGNOSIS — F329 Major depressive disorder, single episode, unspecified: Secondary | ICD-10-CM | POA: Insufficient documentation

## 2012-12-09 DIAGNOSIS — Z8661 Personal history of infections of the central nervous system: Secondary | ICD-10-CM | POA: Insufficient documentation

## 2012-12-09 DIAGNOSIS — F3289 Other specified depressive episodes: Secondary | ICD-10-CM | POA: Insufficient documentation

## 2012-12-09 DIAGNOSIS — F411 Generalized anxiety disorder: Secondary | ICD-10-CM | POA: Insufficient documentation

## 2012-12-09 DIAGNOSIS — Z8679 Personal history of other diseases of the circulatory system: Secondary | ICD-10-CM | POA: Insufficient documentation

## 2012-12-09 DIAGNOSIS — Z87728 Personal history of other specified (corrected) congenital malformations of nervous system and sense organs: Secondary | ICD-10-CM | POA: Insufficient documentation

## 2012-12-09 DIAGNOSIS — K219 Gastro-esophageal reflux disease without esophagitis: Secondary | ICD-10-CM | POA: Insufficient documentation

## 2012-12-09 DIAGNOSIS — R45851 Suicidal ideations: Secondary | ICD-10-CM | POA: Insufficient documentation

## 2012-12-09 DIAGNOSIS — J45909 Unspecified asthma, uncomplicated: Secondary | ICD-10-CM | POA: Insufficient documentation

## 2012-12-09 DIAGNOSIS — Z880 Allergy status to penicillin: Secondary | ICD-10-CM | POA: Insufficient documentation

## 2012-12-09 LAB — URINALYSIS, ROUTINE W REFLEX MICROSCOPIC
Bilirubin Urine: NEGATIVE
Glucose, UA: NEGATIVE mg/dL
Hgb urine dipstick: NEGATIVE
Ketones, ur: NEGATIVE mg/dL
Leukocytes, UA: NEGATIVE
Nitrite: NEGATIVE
Protein, ur: NEGATIVE mg/dL
Specific Gravity, Urine: 1.009 (ref 1.005–1.030)
Urobilinogen, UA: 0.2 mg/dL (ref 0.0–1.0)
pH: 6.5 (ref 5.0–8.0)

## 2012-12-09 LAB — RAPID URINE DRUG SCREEN, HOSP PERFORMED
Amphetamines: NOT DETECTED
Barbiturates: NOT DETECTED
Benzodiazepines: NOT DETECTED
Cocaine: NOT DETECTED
Opiates: NOT DETECTED
Tetrahydrocannabinol: NOT DETECTED

## 2012-12-09 LAB — CBC WITH DIFFERENTIAL/PLATELET
Basophils Absolute: 0 10*3/uL (ref 0.0–0.1)
Basophils Relative: 1 % (ref 0–1)
Eosinophils Absolute: 0.2 10*3/uL (ref 0.0–0.7)
Eosinophils Relative: 3 % (ref 0–5)
HCT: 38.3 % (ref 36.0–46.0)
Hemoglobin: 12.6 g/dL (ref 12.0–15.0)
Lymphocytes Relative: 33 % (ref 12–46)
Lymphs Abs: 2.1 10*3/uL (ref 0.7–4.0)
MCH: 28.1 pg (ref 26.0–34.0)
MCHC: 32.9 g/dL (ref 30.0–36.0)
MCV: 85.5 fL (ref 78.0–100.0)
Monocytes Absolute: 0.5 10*3/uL (ref 0.1–1.0)
Monocytes Relative: 8 % (ref 3–12)
Neutro Abs: 3.6 10*3/uL (ref 1.7–7.7)
Neutrophils Relative %: 56 % (ref 43–77)
Platelets: 297 10*3/uL (ref 150–400)
RBC: 4.48 MIL/uL (ref 3.87–5.11)
RDW: 12.9 % (ref 11.5–15.5)
WBC: 6.4 10*3/uL (ref 4.0–10.5)

## 2012-12-09 LAB — BASIC METABOLIC PANEL
BUN: 8 mg/dL (ref 6–23)
CO2: 27 mEq/L (ref 19–32)
Calcium: 9.8 mg/dL (ref 8.4–10.5)
Chloride: 101 mEq/L (ref 96–112)
Creatinine, Ser: 0.72 mg/dL (ref 0.50–1.10)
GFR calc Af Amer: >90 mL/min (ref >90)
GFR calc non Af Amer: >90 mL/min (ref >90)
Glucose, Bld: 121 mg/dL — ABNORMAL HIGH (ref 70–99)
Potassium: 3.9 mEq/L (ref 3.5–5.1)
Sodium: 136 mEq/L (ref 135–145)

## 2012-12-09 LAB — POCT PREGNANCY, URINE: Preg Test, Ur: NEGATIVE

## 2012-12-09 LAB — ETHANOL: Alcohol, Ethyl (B): <11 mg/dL (ref 0–11)

## 2012-12-09 MED ORDER — ONDANSETRON HCL 4 MG PO TABS: 4.0000 mg | ORAL_TABLET | Freq: Three times a day (TID) | ORAL | Status: DC | PRN

## 2012-12-09 MED ORDER — ACETAMINOPHEN 325 MG PO TABS: 650.0000 mg | ORAL_TABLET | ORAL | Status: DC | PRN

## 2012-12-09 MED ORDER — LORAZEPAM 1 MG PO TABS: 1.0000 mg | ORAL_TABLET | Freq: Three times a day (TID) | ORAL | Status: DC | PRN

## 2012-12-09 MED ORDER — ZOLPIDEM TARTRATE 5 MG PO TABS: 5.0000 mg | ORAL_TABLET | Freq: Every evening | ORAL | Status: DC | PRN

## 2012-12-09 MED ORDER — ALUM & MAG HYDROXIDE-SIMETH 200-200-20 MG/5ML PO SUSP: 30.0000 mL | ORAL | Status: DC | PRN

## 2012-12-09 MED ORDER — IBUPROFEN 200 MG PO TABS: 600.0000 mg | ORAL_TABLET | Freq: Three times a day (TID) | ORAL | Status: DC | PRN

## 2012-12-09 NOTE — ED Notes (Signed)
Pt transferred from triage, presents for medical clearance, Pt SI with plan to OD on pills.  Denies at present.  Reports she stopped taking Zoloft x 2 wks ago.  Pt reports she feels the meds are not helping. Denies HI or AV hallucinations, denies feeling hopeless. Pt states she has been trying hard to stay out of the hospital and on her regimen. Pt states she attempted SI over 1 yr ago and jumped out of window, states she has taken pills in the past also. Pt reports her 50 yr old daughter has been diag. With Lupus and it is really hard for her to deal with this and stay positive for the daughter.  Pt lives with husband also.  Pt reports she has been diag. With Depression, Anxiety, Panic DO and Borderline Personality DO. Pt calm & cooperative at present.

## 2012-12-09 NOTE — Consult Note (Signed)
Agree with the plan.

## 2012-12-09 NOTE — ED Provider Notes (Signed)
CSN: [id number]     Arrival date & time 12/09/12  5621 History     First MD Initiated Contact with Patient 12/09/12 0251     Chief Complaint  Patient presents with  . Medical Clearance   (Consider location/radiation/quality/duration/timing/severity/associated sxs/prior Treatment) The history is provided by the patient.   32 year old female with history of chronic depression states that her depression has gotten worse over the last 2 weeks. This coincides with her stopping her antidepressant medication. She states that she stopped the medication because it wasn't helping her. Tonight, she had suicidal thoughts of taking an overdose of her pills. She has been having crying spells, and early morning awakening but she denies anhedonia. She denies hallucinations. She denies homicidal ideation.  Past Medical History  Diagnosis Date  . Depression   . GERD (gastroesophageal reflux disease)   . Borderline personality disorder   . Anxiety   . Anginal pain     pt. takes nitrostat- as needed, hasn't had since 04/2012, sees Dr. Sharyn Lull, last ekg was /w PCP- [facility]  . Asthma   . Meningitis     Oct. 2013  . Headache(784.0)   . Neuromuscular disorder     muscle spasms-back & arms   . Anemia     h/o  . Chiari malformation    Past Surgical History  Procedure Laterality Date  . Abdominal hysterectomy    . Peripherally inserted central catheter insertion      during event of Meningitis- then d/c'd to home /w & treated /w antibiotics   . Tubal ligation    . Wisdom tooth extraction    . Vaginal delivery      x3  . Suboccipital craniectomy cervical laminectomy N/A 07/22/2012    Procedure: SUBOCCIPITAL CRANIECTOMY CERVICAL LAMINECTOMY/DURAPLASTY;  Surgeon: Hewitt Shorts, MD;  Location: [facility];  Service: Neurosurgery;  Laterality: N/A;  Suboccipital craniectomy with upper cervial laminectomy with duroplasty    Family History  Problem Relation Age of Onset  . Heart attack Mother     History  Substance Use Topics  . Smoking status: Never Smoker   . Smokeless tobacco: Not on file  . Alcohol Use: No   OB History   Grav Para Term Preterm Abortions TAB SAB Ect Mult Living                 Review of Systems  All other systems reviewed and are negative.    Allergies  Amoxicillin; Imitrex; Penicillins; Norco; Percocet; and Tramadol  Home Medications   Current Outpatient Rx  Name  Route  Sig  Dispense  Refill  . acetaminophen (TYLENOL) 500 MG tablet   Oral   Take 500 mg by mouth every 6 (six) hours as needed for pain.         Marland Kitchen albuterol (PROVENTIL HFA;VENTOLIN HFA) 108 (90 BASE) MCG/ACT inhaler   Inhalation   Inhale 2 puffs into the lungs every 6 (six) hours as needed for shortness of breath.          . ALPRAZolam (XANAX) 0.5 MG tablet   Oral   Take 1 tablet (0.5 mg total) by mouth 3 (three) times daily as needed for anxiety.   20 tablet   0   . cyclobenzaprine (FLEXERIL) 5 MG tablet   Oral   Take 5 mg by mouth at bedtime as needed for muscle spasms.          Marland Kitchen escitalopram (LEXAPRO) 10 MG tablet   Oral   Take  10 mg by mouth daily. Takes with 20mg  to =30mg          . escitalopram (LEXAPRO) 20 MG tablet   Oral   Take 20 mg by mouth daily before breakfast. Takes with 10mg  to =30mg          . ibuprofen (ADVIL,MOTRIN) 200 MG tablet   Oral   Take 800 mg by mouth every 8 (eight) hours as needed for pain.          Marland Kitchen omeprazole (PRILOSEC) 40 MG capsule   Oral   Take 40 mg by mouth daily with supper.          . ondansetron (ZOFRAN) 4 MG tablet   Oral   Take 1 tablet (4 mg total) by mouth every 6 (six) hours.   10 tablet   0   . tiZANidine (ZANAFLEX) 4 MG tablet   Oral   Take 4 mg by mouth at bedtime as needed.           BP 125/74  Pulse 79  Temp(Src) 98.1 F (36.7 C) (Oral)  Resp 18  SpO2 100% Physical Exam  Nursing note and vitals reviewed.  32 year old female, resting comfortably and in no acute distress. Vital signs  are normal. Oxygen saturation is 100%, which is normal. Head is normocephalic and atraumatic. PERRLA, EOMI. Oropharynx is clear. Neck is nontender and supple without adenopathy or JVD. Back is nontender and there is no CVA tenderness. Lungs are clear without rales, wheezes, or rhonchi. Chest is nontender. Heart has regular rate and rhythm without murmur. Abdomen is soft, flat, nontender without masses or hepatosplenomegaly and peristalsis is normoactive. Extremities have no cyanosis or edema, full range of motion is present. Skin is warm and dry without rash. Neurologic: Mental status is normal, cranial nerves are intact, there are no motor or sensory deficits.  ED Course   Procedures (including critical care time)  Results for orders placed during the hospital encounter of 12/09/12  CBC WITH DIFFERENTIAL      Result Value Range   WBC 6.4  4.0 - 10.5 K/uL   RBC 4.48  3.87 - 5.11 MIL/uL   Hemoglobin 12.6  12.0 - 15.0 g/dL   HCT 13.0  86.5 - 78.4 %   MCV 85.5  78.0 - 100.0 fL   MCH 28.1  26.0 - 34.0 pg   MCHC 32.9  30.0 - 36.0 g/dL   RDW 69.6  29.5 - 28.4 %   Platelets 297  150 - 400 K/uL   Neutrophils Relative % 56  43 - 77 %   Neutro Abs 3.6  1.7 - 7.7 K/uL   Lymphocytes Relative 33  12 - 46 %   Lymphs Abs 2.1  0.7 - 4.0 K/uL   Monocytes Relative 8  3 - 12 %   Monocytes Absolute 0.5  0.1 - 1.0 K/uL   Eosinophils Relative 3  0 - 5 %   Eosinophils Absolute 0.2  0.0 - 0.7 K/uL   Basophils Relative 1  0 - 1 %   Basophils Absolute 0.0  0.0 - 0.1 K/uL  BASIC METABOLIC PANEL      Result Value Range   Sodium 136  135 - 145 mEq/L   Potassium 3.9  3.5 - 5.1 mEq/L   Chloride 101  96 - 112 mEq/L   CO2 27  19 - 32 mEq/L   Glucose, Bld 121 (*) 70 - 99 mg/dL   BUN 8  6 - 23 mg/dL  Creatinine, Ser 0.72  0.50 - 1.10 mg/dL   Calcium 9.8  8.4 - 62.1 mg/dL   GFR calc non Af Amer >90  >90 mL/min   GFR calc Af Amer >90  >90 mL/min  ETHANOL      Result Value Range   Alcohol, Ethyl (B)  <11  0 - 11 mg/dL  URINE RAPID DRUG SCREEN (HOSP PERFORMED)      Result Value Range   Opiates NONE DETECTED  NONE DETECTED   Cocaine NONE DETECTED  NONE DETECTED   Benzodiazepines NONE DETECTED  NONE DETECTED   Amphetamines NONE DETECTED  NONE DETECTED   Tetrahydrocannabinol NONE DETECTED  NONE DETECTED   Barbiturates NONE DETECTED  NONE DETECTED  URINALYSIS, ROUTINE W REFLEX MICROSCOPIC      Result Value Range   Color, Urine YELLOW  YELLOW   APPearance CLEAR  CLEAR   Specific Gravity, Urine 1.009  1.005 - 1.030   pH 6.5  5.0 - 8.0   Glucose, UA NEGATIVE  NEGATIVE mg/dL   Hgb urine dipstick NEGATIVE  NEGATIVE   Bilirubin Urine NEGATIVE  NEGATIVE   Ketones, ur NEGATIVE  NEGATIVE mg/dL   Protein, ur NEGATIVE  NEGATIVE mg/dL   Urobilinogen, UA 0.2  0.0 - 1.0 mg/dL   Nitrite NEGATIVE  NEGATIVE   Leukocytes, UA NEGATIVE  NEGATIVE  POCT PREGNANCY, URINE      Result Value Range   Preg Test, Ur NEGATIVE  NEGATIVE   1. Depression   2. Suicidal ideation     MDM  Depression with suicidal ideation. Patient is willing to come in voluntarily. Screening labs are obtained and psychiatric consultation will be obtained. Of note, she is under outpatient psychiatric care through [facility].  Dione Booze, MD 12/09/12 [phone]

## 2012-12-09 NOTE — ED Notes (Signed)
Pt's husband is present and is talking with patient. Pt's Husband has requested that he be informed by a behavioral health personel as to the status of his wife's treatment.  Pt's husband informed that no guarantee of such could be given but that Nursing would write a note for the chart stating his desire.

## 2012-12-09 NOTE — ED Provider Notes (Signed)
10:51 AM Psych is stating patient is safe for discharge. Patient will f/u with [facility]. She is not endorsing SI/HI. Stable for discharge  1. Depression   2. Suicidal ideation      Dagmar Hait, MD 12/09/12 1104

## 2012-12-09 NOTE — Consult Note (Signed)
[facility] Psychiatry Consult   Reason for Consult:  ED referral Referring Physician:  Dr Thornton Papas is an 32 y.o. female.  Assessment: AXIS I:  Major Depression, Recurrent severe AXIS II:  Borderline Personality Dis. AXIS III:   Past Medical History  Diagnosis Date  . Depression   . GERD (gastroesophageal reflux disease)   . Borderline personality disorder   . Anxiety   . Anginal pain     pt. takes nitrostat- as needed, hasn't had since 04/2012, sees Dr. Sharyn Lull, last ekg was /w PCP- [facility]  . Asthma   . Meningitis     Oct. 2013  . Headache(784.0)   . Neuromuscular disorder     muscle spasms-back & arms   . Anemia     h/o  . Chiari malformation    AXIS IV:  problems with primary support group AXIS V:  41-50 serious symptoms  Plan:  No evidence of imminent risk to self or others at present.   Supportive therapy provided about ongoing stressors. Suicide Risk Assessment 8/18 ED Psych MD Medication Management ASAP appt with [facility] if D/C  Subjective:   [name] is a 32 y.o. female patient admitted with hx of previous suicide attempts and depression unresponsive to SSRI s  HPI:  Pt was brought to ED voluntarily at urging of husband and EMS afetr she was found across street from her house where she had fled to avoid the stress of her family and the feeling she would take a lot of pills again in Suicide attempt.She told herself she was not going to do that and left house telling choildren she would be back but not telling them why she was leaving.Her 77 y/o daughter has been dx'd with SLE and was suffering today-pt stated she tries to be strong for her but leaving was best she could do.She reports she has never done this before. She statesshe has "been working really hard" to deal with her MH issues-attending [facility] .She reports SSRIs have been of no help to her and in fact a recent switch from lexapro to zoloft made her more suicidal so she stopped taking it 2  weeks ago without contacting her [place] provider.She has no suicidal thought/intent now she reports. She denies HI and hallucinations.She does not want to be hospitalized HPI Elements:   Context:  per HPI/ED admission note.  Past Psychiatric History: Past Medical History  Diagnosis Date  . Depression   . GERD (gastroesophageal reflux disease)   . Borderline personality disorder   . Anxiety   . Anginal pain     pt. takes nitrostat- as needed, hasn't had since 04/2012, sees Dr. Sharyn Lull, last ekg was /w PCP- [facility]bere  . Asthma   . Meningitis     Oct. 2013  . Headache(784.0)   . Neuromuscular disorder     muscle spasms-back & arms   . Anemia     h/o  . Chiari malformation     reports that she has never smoked. She does not have any smokeless tobacco history on file. She reports that she does not drink alcohol or use illicit drugs. Family History  Problem Relation Age of Onset  . Heart attack Mother            Allergies:   Allergies  Allergen Reactions  . Amoxicillin Anaphylaxis and Hives  . Imitrex [Sumatriptan Base] Anaphylaxis and Hives  . Penicillins Anaphylaxis and Hives  . Norco [Hydrocodone-Acetaminophen]   . Percocet [Oxycodone-Acetaminophen]   .  Tramadol     Past Psychiatric History: Diagnosis:  MDD  Hospitalizations:  [facility] x 3 Last Nov 2012  Outpatient Care:  [facility]  Substance Abuse Care:  NA  Self-Mutilation:  Scratches in past  Suicidal Attempts:  Multiple in past  Violent Behaviors:  NA   Objective: Blood pressure 125/74, pulse 79, temperature 98.1 F (36.7 C), temperature source Oral, resp. rate 18, SpO2 100.00%.There is no weight on file to calculate BMI. Results for orders placed during the hospital encounter of 12/09/12 (from the past 72 hour(s))  CBC WITH DIFFERENTIAL     Status: None   Collection Time    12/09/12  3:32 AM      Result Value Range   WBC 6.4  4.0 - 10.5 K/uL   RBC 4.48  3.87 - 5.11 MIL/uL   Hemoglobin 12.6  12.0 - 15.0 g/dL   HCT  81.1  91.4 - 78.2 %   MCV 85.5  78.0 - 100.0 fL   MCH 28.1  26.0 - 34.0 pg   MCHC 32.9  30.0 - 36.0 g/dL   RDW 95.6  21.3 - 08.6 %   Platelets 297  150 - 400 K/uL   Neutrophils Relative % 56  43 - 77 %   Neutro Abs 3.6  1.7 - 7.7 K/uL   Lymphocytes Relative 33  12 - 46 %   Lymphs Abs 2.1  0.7 - 4.0 K/uL   Monocytes Relative 8  3 - 12 %   Monocytes Absolute 0.5  0.1 - 1.0 K/uL   Eosinophils Relative 3  0 - 5 %   Eosinophils Absolute 0.2  0.0 - 0.7 K/uL   Basophils Relative 1  0 - 1 %   Basophils Absolute 0.0  0.0 - 0.1 K/uL  BASIC METABOLIC PANEL     Status: Abnormal   Collection Time    12/09/12  3:32 AM      Result Value Range   Sodium 136  135 - 145 mEq/L   Potassium 3.9  3.5 - 5.1 mEq/L   Chloride 101  96 - 112 mEq/L   CO2 27  19 - 32 mEq/L   Glucose, Bld 121 (*) 70 - 99 mg/dL   BUN 8  6 - 23 mg/dL   Creatinine, Ser 5.78  0.50 - 1.10 mg/dL   Calcium 9.8  8.4 - 46.9 mg/dL   GFR calc non Af Amer >90  >90 mL/min   GFR calc Af Amer >90  >90 mL/min   Comment: (NOTE)     The eGFR has been calculated using the CKD EPI equation.     This calculation has not been validated in all clinical situations.     eGFR's persistently <90 mL/min signify possible Chronic Kidney     Disease.  ETHANOL     Status: None   Collection Time    12/09/12  3:32 AM      Result Value Range   Alcohol, Ethyl (B) <11  0 - 11 mg/dL   Comment:            LOWEST DETECTABLE LIMIT FOR     SERUM ALCOHOL IS 11 mg/dL     FOR MEDICAL PURPOSES ONLY  POCT PREGNANCY, URINE     Status: None   Collection Time    12/09/12  4:13 AM      Result Value Range   Preg Test, Ur NEGATIVE  NEGATIVE   Comment:  THE SENSITIVITY OF THIS     METHODOLOGY IS >24 mIU/mL   Labs are reviewed and are pertinent for normal values   Current Facility-Administered Medications  Medication Dose Route Frequency Provider Last Rate Last Dose  . acetaminophen (TYLENOL) tablet 650 mg  650 mg Oral Q4H PRN Dione Booze, MD      .  alum & mag hydroxide-simeth (MAALOX/MYLANTA) 200-200-20 MG/5ML suspension 30 mL  30 mL Oral PRN Dione Booze, MD      . ibuprofen (ADVIL,MOTRIN) tablet 600 mg  600 mg Oral Q8H PRN Dione Booze, MD      . LORazepam (ATIVAN) tablet 1 mg  1 mg Oral Q8H PRN Dione Booze, MD      . ondansetron [facility]) tablet 4 mg  4 mg Oral Q8H PRN Dione Booze, MD      . zolpidem [facility]) tablet 5 mg  5 mg Oral QHS PRN Dione Booze, MD       Current Outpatient Prescriptions  Medication Sig Dispense Refill  . acetaminophen (TYLENOL) 500 MG tablet Take 500 mg by mouth every 6 (six) hours as needed for pain.      Marland Kitchen albuterol (PROVENTIL HFA;VENTOLIN HFA) 108 (90 BASE) MCG/ACT inhaler Inhale 2 puffs into the lungs every 6 (six) hours as needed for shortness of breath.       . ALPRAZolam (XANAX) 0.5 MG tablet Take 1 tablet (0.5 mg total) by mouth 3 (three) times daily as needed for anxiety.  20 tablet  0  . ibuprofen (ADVIL,MOTRIN) 200 MG tablet Take 800 mg by mouth every 8 (eight) hours as needed for pain.       . nitroGLYCERIN (NITROSTAT) 0.4 MG SL tablet Place 0.4 mg under the tongue every 5 (five) minutes as needed for chest pain (chest pain).      . Olopatadine HCl (PATADAY) 0.2 % SOLN Place 1 drop into both eyes daily.      . pantoprazole (PROTONIX) 20 MG tablet Take 20 mg by mouth daily.      . sertraline (ZOLOFT) 50 MG tablet Take 50 mg by mouth daily.        Psychiatric Specialty Exam:     Blood pressure 125/74, pulse 79, temperature 98.1 F (36.7 C), temperature source Oral, resp. rate 18, SpO2 100.00%.There is no weight on file to calculate BMI.  General Appearance: Fairly Groomed  Patent attorney::  Good  Speech:  Clear and Coherent  Volume:  Normal  Mood:  Dysphoric  Affect:  Congruent  Thought Process:  Logical  Orientation:  Full (Time, Place, and Person)  Thought Content:  WDL  Suicidal Thoughts:  No  Homicidal Thoughts:  No  Memory:  Negative  Judgement:  Intact  Insight:  Good  Psychomotor  Activity:  Normal  Concentration:  Good  Recall:  Good  Akathisia:  NA  Handed:  Right  AIMS (if indicated):     Assets:  Resilience  Sleep:   trouble tonite   Treatment Plan Summary:Suicide risk assessment by Psych ED MD Medication management.Release to [facility] for fu if appropriate/depending on risk assessment.Pt informed  Court Joy 12/09/2012 4:53 AM

## 2012-12-09 NOTE — ED Notes (Signed)
Pt arrived from home with a complaint of medical clearance.  Pt has stated that she has suicidal ideations. Pt stated to EMS that she was going to attempt overdose.  Pt presently prescribed Zoloft but has stopped taking said medication x 2 weeks.

## 2012-12-09 NOTE — Progress Notes (Signed)
Patient Identification:  [name] Date of Evaluation:  12/09/2012   History of Present Illness:  Patient was was brought in by Ems after she left their home and walked to a primary school across the road yesterday.  This am on rounds patient states she left the their home yesterday to avoid following through with her suicidal thoughts of over dosing on her pills.  She has a long hx of depression and has tried various medications.  Patient also has a hx of suicide attempts by cutting her wrist and overdosing on her pills.  She reports her depression recently increased due to her daughter recently diagnosed with SLE and her medications changed by her provider from Lexapro to Zoloft.  She stopped taking all of her medications when she felt the Zoloft was making her feel more suicidal daily.  She also reports people are talking about her all the time but she denies AVH.   She also reports mood swings  and insomnia.  Her last admission was 2012 at our inpatient [facility] unit for depression.  This morning she denies SI/HI/AVH and is asking to be discharged home.  She agrees to see her Psychiatrist at [facility] as soon as possible for medication management.  We recommend her switching back to Lexapro and starting on antipsychotics like Geodon or Risperdal.  Patient is in agreement with this plan of care.  Past Psychiatric History:  Major depressive d/o Recurrent severe   Past Medical History:     Past Medical History  Diagnosis Date  . Depression   . GERD (gastroesophageal reflux disease)   . Borderline personality disorder   . Anxiety   . Anginal pain     pt. takes nitrostat- as needed, hasn't had since 04/2012, sees Dr. Sharyn Lull, last ekg was /w PCP- [facility]bere  . Asthma   . Meningitis     Oct. 2013  . Headache(784.0)   . Neuromuscular disorder     muscle spasms-back & arms   . Anemia     h/o  . Chiari malformation        Past Surgical History  Procedure Laterality Date  . Abdominal  hysterectomy    . Peripherally inserted central catheter insertion      during event of Meningitis- then d/c'd to home /w & treated /w antibiotics   . Tubal ligation    . Wisdom tooth extraction    . Vaginal delivery      x3  . Suboccipital craniectomy cervical laminectomy N/A 07/22/2012    Procedure: SUBOCCIPITAL CRANIECTOMY CERVICAL LAMINECTOMY/DURAPLASTY;  Surgeon: Hewitt Shorts, MD;  Location: [facility];  Service: Neurosurgery;  Laterality: N/A;  Suboccipital craniectomy with upper cervial laminectomy with duroplasty     Allergies:  Allergies  Allergen Reactions  . Amoxicillin Anaphylaxis and Hives  . Imitrex [Sumatriptan Base] Anaphylaxis and Hives  . Penicillins Anaphylaxis and Hives  . Norco [Hydrocodone-Acetaminophen]   . Percocet [Oxycodone-Acetaminophen]   . Tramadol     Current Medications:  Prior to Admission medications   Medication Sig Start Date End Date Taking? Authorizing Provider  acetaminophen (TYLENOL) 500 MG tablet Take 500 mg by mouth every 6 (six) hours as needed for pain.   Yes Historical Provider, MD  albuterol (PROVENTIL HFA;VENTOLIN HFA) 108 (90 BASE) MCG/ACT inhaler Inhale 2 puffs into the lungs every 6 (six) hours as needed for shortness of breath.    Yes Historical Provider, MD  ALPRAZolam Prudy Feeler) 0.5 MG tablet Take 1 tablet (0.5 mg total) by  mouth 3 (three) times daily as needed for anxiety. 08/05/12  Yes Benny Lennert, MD  ibuprofen (ADVIL,MOTRIN) 200 MG tablet Take 800 mg by mouth every 8 (eight) hours as needed for pain.    Yes Historical Provider, MD  nitroGLYCERIN (NITROSTAT) 0.4 MG SL tablet Place 0.4 mg under the tongue every 5 (five) minutes as needed for chest pain (chest pain).   Yes Historical Provider, MD  Olopatadine HCl (PATADAY) 0.2 % SOLN Place 1 drop into both eyes daily.   Yes Historical Provider, MD  pantoprazole (PROTONIX) 20 MG tablet Take 20 mg by mouth daily.   Yes Historical Provider, MD  sertraline (ZOLOFT) 50 MG tablet  Take 50 mg by mouth daily.   Yes Historical Provider, MD    Social History:    reports that she has never smoked. She does not have any smokeless tobacco history on file. She reports that she does not drink alcohol or use illicit drugs.   Family History:    Family History  Problem Relation Age of Onset  . Heart attack Mother     Mental Status Examination/Evaluation:  Well groomed AA female who appears stated age is seen this am awake, alert and oriented x3.  She is alert,cooperative and willingly answers questions.  She reports her mood to be depressed and her affect is congruent.  Her attention and concentration are wnl/normal, her thought process and content is normal except for occasional paranoia where she thinks people are talking about her.  Her insight and judgement are normal.  She plans to call her provider if the thoughts to hurt herself becomes overwhelming for her.   DIAGNOSIS:   AXIS I   Major depressive d/o, Recurrent severe  AXIS II  Deffered  AXIS III See medical notes.  AXIS IV other psychosocial or environmental problems and problems related to social environment  AXIS V 61-70 mild symptoms     Assessment/Plan:  Consult and face to face interview with Dr Lolly Mustache Patient is discharged back to [facility] for her follow up care We recommend her starting back on her Lexapro, stop Zoloft  We also recommend her starting Risperdal or Geodon to control her mood, help with her insomnia and paranoia.  I have personally seen the patient and agreed with the findings and involved in the treatment plan. Kathryne Sharper, MD

## 2012-12-09 NOTE — ED Notes (Signed)
Pt's belongings taken home by husband

## 2012-12-09 NOTE — BH Assessment (Signed)
Per Julieanne Cotton NP, pt is psychiatrically clear and can be d/c with followup with her current provider at [facility]. Julieanne Cotton reports she will give pt  Writer relayed info the EDP Gwendolyn Grant and Gwendolyn Grant indicates he will d/c pt.   Evette Cristal, [place] Assessment Counselor

## 2013-02-20 ENCOUNTER — Other Ambulatory Visit: Payer: Self-pay | Admitting: Neurosurgery

## 2013-02-20 DIAGNOSIS — G935 Compression of brain: Secondary | ICD-10-CM

## 2013-02-22 ENCOUNTER — Other Ambulatory Visit: Payer: Self-pay

## 2013-02-27 ENCOUNTER — Other Ambulatory Visit: Payer: Self-pay

## 2013-03-02 ENCOUNTER — Ambulatory Visit
Admission: RE | Admit: 2013-03-02 | Discharge: 2013-03-02 | Disposition: A | Discharge status: Home / Self Care | Payer: Medicaid Other | Source: Ambulatory Visit | Attending: Neurosurgery | Admitting: Neurosurgery

## 2013-03-02 DIAGNOSIS — G935 Compression of brain: Secondary | ICD-10-CM

## 2013-04-08 ENCOUNTER — Emergency Department (HOSPITAL_COMMUNITY)
Admission: EM | Admit: 2013-04-08 | Discharge: 2013-04-08 | Disposition: A | Discharge status: Home / Self Care | Payer: Medicaid Other | Attending: Emergency Medicine | Admitting: Emergency Medicine

## 2013-04-08 ENCOUNTER — Encounter (HOSPITAL_COMMUNITY): Payer: Self-pay | Admitting: Emergency Medicine

## 2013-04-08 DIAGNOSIS — Z885 Allergy status to narcotic agent status: Secondary | ICD-10-CM | POA: Insufficient documentation

## 2013-04-08 DIAGNOSIS — Z888 Allergy status to other drugs, medicaments and biological substances status: Secondary | ICD-10-CM | POA: Insufficient documentation

## 2013-04-08 DIAGNOSIS — M546 Pain in thoracic spine: Secondary | ICD-10-CM | POA: Insufficient documentation

## 2013-04-08 DIAGNOSIS — F603 Borderline personality disorder: Secondary | ICD-10-CM | POA: Insufficient documentation

## 2013-04-08 DIAGNOSIS — Z8679 Personal history of other diseases of the circulatory system: Secondary | ICD-10-CM | POA: Insufficient documentation

## 2013-04-08 DIAGNOSIS — Z8661 Personal history of infections of the central nervous system: Secondary | ICD-10-CM | POA: Insufficient documentation

## 2013-04-08 DIAGNOSIS — F329 Major depressive disorder, single episode, unspecified: Secondary | ICD-10-CM | POA: Insufficient documentation

## 2013-04-08 DIAGNOSIS — M6281 Muscle weakness (generalized): Secondary | ICD-10-CM | POA: Insufficient documentation

## 2013-04-08 DIAGNOSIS — R42 Dizziness and giddiness: Secondary | ICD-10-CM | POA: Insufficient documentation

## 2013-04-08 DIAGNOSIS — R51 Headache: Secondary | ICD-10-CM | POA: Insufficient documentation

## 2013-04-08 DIAGNOSIS — K219 Gastro-esophageal reflux disease without esophagitis: Secondary | ICD-10-CM | POA: Insufficient documentation

## 2013-04-08 DIAGNOSIS — D649 Anemia, unspecified: Secondary | ICD-10-CM | POA: Insufficient documentation

## 2013-04-08 DIAGNOSIS — Q054 Unspecified spina bifida with hydrocephalus: Secondary | ICD-10-CM | POA: Insufficient documentation

## 2013-04-08 DIAGNOSIS — M549 Dorsalgia, unspecified: Secondary | ICD-10-CM

## 2013-04-08 DIAGNOSIS — R5381 Other malaise: Secondary | ICD-10-CM | POA: Insufficient documentation

## 2013-04-08 DIAGNOSIS — F3289 Other specified depressive episodes: Secondary | ICD-10-CM | POA: Insufficient documentation

## 2013-04-08 DIAGNOSIS — Z880 Allergy status to penicillin: Secondary | ICD-10-CM | POA: Insufficient documentation

## 2013-04-08 DIAGNOSIS — M79609 Pain in unspecified limb: Secondary | ICD-10-CM | POA: Insufficient documentation

## 2013-04-08 DIAGNOSIS — F411 Generalized anxiety disorder: Secondary | ICD-10-CM | POA: Insufficient documentation

## 2013-04-08 DIAGNOSIS — Z881 Allergy status to other antibiotic agents status: Secondary | ICD-10-CM | POA: Insufficient documentation

## 2013-04-08 DIAGNOSIS — M542 Cervicalgia: Secondary | ICD-10-CM | POA: Insufficient documentation

## 2013-04-08 DIAGNOSIS — G709 Myoneural disorder, unspecified: Secondary | ICD-10-CM | POA: Insufficient documentation

## 2013-04-08 DIAGNOSIS — J45909 Unspecified asthma, uncomplicated: Secondary | ICD-10-CM | POA: Insufficient documentation

## 2013-04-08 DIAGNOSIS — R209 Unspecified disturbances of skin sensation: Secondary | ICD-10-CM | POA: Insufficient documentation

## 2013-04-08 DIAGNOSIS — Z8669 Personal history of other diseases of the nervous system and sense organs: Secondary | ICD-10-CM | POA: Insufficient documentation

## 2013-04-08 DIAGNOSIS — Z79899 Other long term (current) drug therapy: Secondary | ICD-10-CM | POA: Insufficient documentation

## 2013-04-08 LAB — CBC WITH DIFFERENTIAL/PLATELET
Basophils Absolute: 0 10*3/uL (ref 0.0–0.1)
Basophils Relative: 1 % (ref 0–1)
Eosinophils Absolute: 0.2 10*3/uL (ref 0.0–0.7)
Eosinophils Relative: 3 % (ref 0–5)
HCT: 37.9 % (ref 36.0–46.0)
Hemoglobin: 13 g/dL (ref 12.0–15.0)
Lymphocytes Relative: 39 % (ref 12–46)
Lymphs Abs: 2.5 10*3/uL (ref 0.7–4.0)
MCH: 29.1 pg (ref 26.0–34.0)
MCHC: 34.3 g/dL (ref 30.0–36.0)
MCV: 85 fL (ref 78.0–100.0)
Monocytes Absolute: 0.4 10*3/uL (ref 0.1–1.0)
Monocytes Relative: 5 % (ref 3–12)
Neutro Abs: 3.4 10*3/uL (ref 1.7–7.7)
Neutrophils Relative %: 53 % (ref 43–77)
Platelets: 300 10*3/uL (ref 150–400)
RBC: 4.46 MIL/uL (ref 3.87–5.11)
RDW: 12.7 % (ref 11.5–15.5)
WBC: 6.5 10*3/uL (ref 4.0–10.5)

## 2013-04-08 LAB — COMPREHENSIVE METABOLIC PANEL
ALT: 12 U/L (ref 0–35)
AST: 20 U/L (ref 0–37)
Albumin: 3.8 g/dL (ref 3.5–5.2)
Alkaline Phosphatase: 81 U/L (ref 39–117)
BUN: 10 mg/dL (ref 6–23)
CO2: 24 mEq/L (ref 19–32)
Calcium: 9.4 mg/dL (ref 8.4–10.5)
Chloride: 102 mEq/L (ref 96–112)
Creatinine, Ser: 0.71 mg/dL (ref 0.50–1.10)
GFR calc Af Amer: >90 mL/min (ref >90)
GFR calc non Af Amer: >90 mL/min (ref >90)
Glucose, Bld: 98 mg/dL (ref 70–99)
Potassium: 3.7 mEq/L (ref 3.5–5.1)
Sodium: 136 mEq/L (ref 135–145)
Total Bilirubin: 0.2 mg/dL — ABNORMAL LOW (ref 0.3–1.2)
Total Protein: 7.9 g/dL (ref 6.0–8.3)

## 2013-04-08 MED ORDER — KETOROLAC TROMETHAMINE 30 MG/ML IJ SOLN: 30.0000 mg | Freq: Once | INTRAMUSCULAR | Status: DC

## 2013-04-08 MED ORDER — KETOROLAC TROMETHAMINE 30 MG/ML IJ SOLN
30.0000 mg | Freq: Once | INTRAMUSCULAR | Status: AC
  Administered 2013-04-08: 30 mg via INTRAMUSCULAR
  Filled 2013-04-08: qty 1

## 2013-04-08 MED ORDER — IBUPROFEN 600 MG PO TABS: 600.0000 mg | ORAL_TABLET | Freq: Four times a day (QID) | ORAL | Status: DC | PRN

## 2013-04-08 NOTE — ED Notes (Signed)
Pt c/o pain in mid upper to mid lower back.  St's she had surg. In March and continues to have pain

## 2013-04-08 NOTE — ED Provider Notes (Signed)
CSN: [id number]     Arrival date & time 04/08/13  1449 History   First MD Initiated Contact with Patient 04/08/13 1524     Chief Complaint  Patient presents with  . Back Pain   (Consider location/radiation/quality/duration/timing/severity/associated sxs/prior Treatment) HPI Comments: Patient is a 31 y.o. female with a history of upper back pain since March, Chiari malformation, type I s/p surgery and MRI 02/10/2013, and anemia,  reports mid and upper back pain. The pain began to be consistent in September. The pain is worsened with bending and walking over long periods of time.  She reports more pain in the the left upper extremity. She feels better when she is sitting "slumped over". Pain is mostly in the center of the back and radiating around. Her arms and hands feel numb but is not in pain.  She denies any fever or chills. She does feel "exhausted" and "worn out". She denies any bowel or urine incontinence. Sometimes she experiences some light headedness with an associated headache. No hx of hypertension or diabetes.  She also states she received the MRI results but has been unable to schedule a follow up appointment for a treatment plan.  Neurosurgeon: Marval Regal  Patient is a 32 y.o. female presenting with back pain. The history is provided by the patient and medical records.  Back Pain Associated symptoms: headaches and weakness   Associated symptoms: no dysuria and no fever   Pt reports having a total hysterectomy in the past.  Past Medical History  Diagnosis Date  . Depression   . GERD (gastroesophageal reflux disease)   . Borderline personality disorder   . Anxiety   . Anginal pain     pt. takes nitrostat- as needed, hasn't had since 04/2012, sees Dr. Sharyn Lull, last ekg was /w PCP- [facility]  . Asthma   . Meningitis     Oct. 2013  . Headache(784.0)   . Neuromuscular disorder     muscle spasms-back & arms   . Anemia     h/o  . Chiari malformation    Past Surgical History   Procedure Laterality Date  . Abdominal hysterectomy    . Peripherally inserted central catheter insertion      during event of Meningitis- then d/c'd to home /w & treated /w antibiotics   . Tubal ligation    . Wisdom tooth extraction    . Vaginal delivery      x3  . Suboccipital craniectomy cervical laminectomy N/A 07/22/2012    Procedure: SUBOCCIPITAL CRANIECTOMY CERVICAL LAMINECTOMY/DURAPLASTY;  Surgeon: Hewitt Shorts, MD;  Location: [facility];  Service: Neurosurgery;  Laterality: N/A;  Suboccipital craniectomy with upper cervial laminectomy with duroplasty    Family History  Problem Relation Age of Onset  . Heart attack Mother    History  Substance Use Topics  . Smoking status: Never Smoker   . Smokeless tobacco: Not on file  . Alcohol Use: No   OB History   Grav Para Term Preterm Abortions TAB SAB Ect Mult Living                 Review of Systems  Constitutional: Negative for fever, chills and diaphoresis.  Genitourinary: Negative for dysuria and decreased urine volume.  Musculoskeletal: Positive for back pain.  Neurological: Positive for weakness and headaches.    Allergies  Amoxicillin; Imitrex; Penicillins; Norco; Percocet; and Tramadol  Home Medications   Current Outpatient Rx  Name  Route  Sig  Dispense  Refill  .  acetaminophen (TYLENOL) 500 MG tablet   Oral   Take 500 mg by mouth every 6 (six) hours as needed for pain.         Marland Kitchen albuterol (PROVENTIL HFA;VENTOLIN HFA) 108 (90 BASE) MCG/ACT inhaler   Inhalation   Inhale 2 puffs into the lungs every 6 (six) hours as needed for shortness of breath.          . ALPRAZolam (XANAX) 0.5 MG tablet   Oral   Take 1 tablet (0.5 mg total) by mouth 3 (three) times daily as needed for anxiety.   20 tablet   0   . ibuprofen (ADVIL,MOTRIN) 200 MG tablet   Oral   Take 800 mg by mouth every 8 (eight) hours as needed for pain.          . nitroGLYCERIN (NITROSTAT) 0.4 MG SL tablet   Sublingual   Place 0.4  mg under the tongue every 5 (five) minutes as needed for chest pain (chest pain).         . Olopatadine HCl (PATADAY) 0.2 % SOLN   Both Eyes   Place 1 drop into both eyes daily.         . pantoprazole (PROTONIX) 20 MG tablet   Oral   Take 20 mg by mouth daily.         . sertraline (ZOLOFT) 50 MG tablet   Oral   Take 50 mg by mouth daily.          BP 119/74  Pulse 71  Temp(Src) 97.9 F (36.6 C) (Oral)  Resp 18  SpO2 99% Physical Exam  Nursing note and vitals reviewed. Constitutional: She is oriented to person, place, and time. She appears well-developed and well-nourished. No distress.  HENT:  Head: Normocephalic and atraumatic.  Eyes: EOM are normal. Pupils are equal, round, and reactive to light.  Neck: Normal range of motion. Neck supple. Muscular tenderness present. No spinous process tenderness present. No edema present.  Midline well healing scar to upper C-spine without signs of infection.  Cardiovascular: Normal rate, regular rhythm and normal heart sounds.   Pulmonary/Chest: Effort normal. No respiratory distress. She has no decreased breath sounds. She has no wheezes. She has no rhonchi.  Abdominal: Soft. Normal appearance.  Musculoskeletal:       Back:  Neurological: She is alert and oriented to person, place, and time. No cranial nerve deficit or sensory deficit. She exhibits normal muscle tone. Gait normal.  Reflex Scores:      Bicep reflexes are 1+ on the right side and 1+ on the left side. Good upper body strength with full ROM of upper extremities.   Skin: Skin is warm and dry. No rash noted.    ED Course  Procedures (including critical care time) Labs Review Labs Reviewed - No data to display Imaging Review No results found. CLINICAL DATA: Post Chiari 1 malformation decompression March 2014.  Symptoms have improved but are not gone. Neck pain. Upper back pain  ongoing for 2 months off and on. History of anemia.  EXAM:  MRI CERVICAL SPINE  WITHOUT CONTRAST  TECHNIQUE:  Multiplanar, multisequence MR imaging was performed. No intravenous  contrast was administered.  COMPARISON: 08/01/2012 CT. 06/24/2012 MR.  FINDINGS:  Post suboccipital decompressive surgery for Chiari 1 malformation.  Significant improvement in the degree of crowding of the low lying  cerebellar tonsils. No worrisome postoperative fluid collection  identified (improvement since the postoperative CT).  Mild motion degradation causes artifact extending through portions  of the cervical cord but without syrinx identified.  Decreased signal intensity of bone marrow may be related to  patient's history of anemia.  Both vertebral arteries are patent.  Slightly heterogeneous appearance of the right parotid gland  unchanged. Similar appearance of normal to top-normal size lymph  nodes throughout the neck larger on the right.  C2-3: Negative.  C3-4: Shallow right posterior lateral disc osteophyte with mild  right-sided spinal stenosis.  C4-5: Negative.  C5-6: Bulge/shallow protrusion without significant spinal stenosis  or cord contact.  C6-7: Broad-based protrusion with greatest extension left posterior  lateral position with elevation of the posterior longitudinal  ligament. Spinal stenosis greater on the left with crowding of the  ventral nerve roots more notable on left. Minimal left-sided cord  flattening.  C7-T1: Negative.  IMPRESSION:  Post suboccipital decompressive surgery for Chiari 1 malformation.  Significant improvement in the degree of crowding of the low lying  cerebellar tonsils. No worrisome postoperative fluid collection  identified (improvement since the postoperative CT).  Very mild progression of degenerative changes at the C6-7 level as  noted above.  Remainder of findings are relatively similar to prior exam as  detailed above.  Electronically Signed  By: Bridgett Larsson M.D.  On: 03/03/2013 07:32 EKG Interpretation   None        MDM   1. Upper back pain   2. Headache    Patient presents with several month history of upper back pain and Left arm weakness and associated lightheadedness and headache.  No red flag on exam. EMR reveals MRI 02/10/2013 C3-4: Shallow right posterior lateral disc osteophyte with mild right-sided spinal stenosis. CBC and CMP ordered. Discussed patient history, condition, and labs with Dr.James who agrees the patient can be further evaluated as an out-pt. Discussed lab results, previous MRI results, and treatment plan with the patient. Return precautions given. Reports understanding and no other concerns at this time.  Patient is stable for discharge at this time. Meds given in ED:  Medications  ketorolac (TORADOL) 30 MG/ML injection 30 mg (30 mg Intramuscular Given 04/08/13 1809)    Discharge Medication List as of 04/08/2013  5:36 PM      I personally performed the services described in this documentation, which was scribed in my presence. The recorded information has been reviewed and is accurate.      Clabe Seal, PA-C 04/09/13 [phone]

## 2013-04-08 NOTE — ED Notes (Signed)
Pt reports hx of mid and upper back pain, had MRI done in nov and told she has spinal stenosis and has been trying to get appt with dr Charlann Boxer. Ambulatory at triage with no distress.

## 2013-04-12 NOTE — ED Provider Notes (Signed)
Medical screening examination/treatment/procedure(s) were performed by non-physician practitioner and as supervising physician I was immediately available for consultation/collaboration.  EKG Interpretation   None         Roney Marion, MD 04/12/13 [phone]

## 2013-09-13 ENCOUNTER — Emergency Department (HOSPITAL_COMMUNITY)
Admission: EM | Admit: 2013-09-13 | Discharge: 2013-09-13 | Disposition: A | Discharge status: Home / Self Care | Payer: Medicaid Other | Source: Home / Self Care | Attending: Family Medicine | Admitting: Family Medicine

## 2013-09-13 ENCOUNTER — Encounter (HOSPITAL_COMMUNITY): Payer: Self-pay | Admitting: Emergency Medicine

## 2013-09-13 DIAGNOSIS — R071 Chest pain on breathing: Secondary | ICD-10-CM

## 2013-09-13 DIAGNOSIS — K089 Disorder of teeth and supporting structures, unspecified: Secondary | ICD-10-CM

## 2013-09-13 DIAGNOSIS — R0789 Other chest pain: Secondary | ICD-10-CM

## 2013-09-13 DIAGNOSIS — K0889 Other specified disorders of teeth and supporting structures: Secondary | ICD-10-CM

## 2013-09-13 MED ORDER — DICLOFENAC POTASSIUM 50 MG PO TABS: 50.0000 mg | ORAL_TABLET | Freq: Three times a day (TID) | ORAL | Status: DC

## 2013-09-13 MED ORDER — CLINDAMYCIN HCL 300 MG PO CAPS: 300.0000 mg | ORAL_CAPSULE | Freq: Three times a day (TID) | ORAL | Status: DC

## 2013-09-13 NOTE — Discharge Instructions (Signed)
Take medicine as prescribed, see your dentist as soon as possible °

## 2013-09-13 NOTE — ED Notes (Addendum)
Pt comes in with c/o right mouth tooth pain with swelling with pain radiating with pounding headache.pt states she has dentist appt next Wednesday. Denies drainage Pt also c/o mid sternal chest pain after being started on depression medication Pt is taking Celexa,Risperidol 0.5 mg and Lamictal Denies fever,chills,n,v

## 2013-09-13 NOTE — ED Provider Notes (Signed)
CSN: [id number]     Arrival date & time 09/13/13  1709 History   First MD Initiated Contact with Patient 09/13/13 1808     Chief Complaint  Patient presents with  . Dental Pain  . Headache  . Chest Pain   (Consider location/radiation/quality/duration/timing/severity/associated sxs/prior Treatment) Patient is a 33 y.o. female presenting with tooth pain, headaches, and chest pain. The history is provided by the patient.  Dental Pain Location:  Lower Lower teeth location:  27/RL cuspid Severity:  Moderate Onset quality:  Gradual Duration:  3 days Timing:  Constant Progression:  Unchanged Chronicity:  New Context: dental caries   Associated symptoms: facial pain, gum swelling and headaches   Headache Associated symptoms: facial pain   Chest Pain Pain location:  L chest Pain quality: sharp   Pain radiates to:  Does not radiate Pain radiates to the back: no   Pain severity:  Mild Onset quality:  Gradual Duration:  1 week Progression:  Unchanged Chronicity:  New Context: movement   Associated symptoms: headache     Past Medical History  Diagnosis Date  . Depression   . GERD (gastroesophageal reflux disease)   . Borderline personality disorder   . Anxiety   . Anginal pain     pt. takes nitrostat- as needed, hasn't had since 04/2012, sees Dr. Terrence Dupont, last ekg was /w PCP- [facility]  . Asthma   . Meningitis     Oct. 2013  . Headache(784.0)   . Neuromuscular disorder     muscle spasms-back & arms   . Anemia     h/o  . Chiari malformation    Past Surgical History  Procedure Laterality Date  . Abdominal hysterectomy    . Peripherally inserted central catheter insertion      during event of Meningitis- then d/c'd to home /w & treated /w antibiotics   . Tubal ligation    . Wisdom tooth extraction    . Vaginal delivery      x3  . Suboccipital craniectomy cervical laminectomy N/A 07/22/2012    Procedure: SUBOCCIPITAL CRANIECTOMY CERVICAL LAMINECTOMY/DURAPLASTY;  Surgeon:  Hosie Spangle, MD;  Location: [facility];  Service: Neurosurgery;  Laterality: N/A;  Suboccipital craniectomy with upper cervial laminectomy with duroplasty    Family History  Problem Relation Age of Onset  . Heart attack Mother    History  Substance Use Topics  . Smoking status: Never Smoker   . Smokeless tobacco: Not on file  . Alcohol Use: No   OB History   Grav Para Term Preterm Abortions TAB SAB Ect Mult Living                 Review of Systems  Constitutional: Negative.   HENT: Positive for dental problem.   Respiratory: Negative.   Cardiovascular: Positive for chest pain. Negative for leg swelling.  Gastrointestinal: Negative.   Neurological: Positive for headaches.    Allergies  Amoxicillin; Imitrex; Penicillins; Norco; Percocet; Soy allergy; and Tramadol  Home Medications   Prior to Admission medications   Medication Sig Start Date End Date Taking? Authorizing Provider  citalopram (CELEXA) 20 MG tablet Take 20 mg by mouth daily.   Yes Historical Provider, MD  lamoTRIgine (LAMICTAL) 25 MG tablet Take 25 mg by mouth daily.   Yes Historical Provider, MD  risperiDONE (RISPERDAL) 0.5 MG tablet Take 0.5 mg by mouth at bedtime.   Yes Historical Provider, MD  adapalene (DIFFERIN) 0.1 % cream Apply 1 application topically at bedtime.  Historical Provider, MD  albuterol (PROVENTIL HFA;VENTOLIN HFA) 108 (90 BASE) MCG/ACT inhaler Inhale 2 puffs into the lungs every 6 (six) hours as needed for shortness of breath.     Historical Provider, MD  ibuprofen (ADVIL,MOTRIN) 200 MG tablet Take 800 mg by mouth every 8 (eight) hours as needed for pain.     Historical Provider, MD  ibuprofen (ADVIL,MOTRIN) 600 MG tablet Take 1 tablet (600 mg total) by mouth every 6 (six) hours as needed. 04/08/13   Lauren Burnetta Sabin, PA-C  meclizine (ANTIVERT) 25 MG tablet Take 25 mg by mouth 3 (three) times daily as needed for dizziness or nausea.    Historical Provider, MD  milk thistle 175 MG  tablet Take 350 mg by mouth daily.    Historical Provider, MD  nitroGLYCERIN (NITROSTAT) 0.4 MG SL tablet Place 0.4 mg under the tongue every 5 (five) minutes as needed for chest pain (chest pain).    Historical Provider, MD  Olopatadine HCl (PATADAY) 0.2 % SOLN Place 1 drop into both eyes daily.    Historical Provider, MD  pantoprazole (PROTONIX) 20 MG tablet Take 20 mg by mouth daily.    Historical Provider, MD  promethazine (PHENERGAN) 12.5 MG tablet Take 12.5 mg by mouth every 6 (six) hours as needed for nausea or vomiting.    Historical Provider, MD  Vortioxetine HBr (BRINTELLIX) 10 MG TABS Take 10 mg by mouth daily.    Historical Provider, MD   BP 114/81  Pulse 63  Temp(Src) 98.8 F (37.1 C) (Oral)  Resp 15  SpO2 100% Physical Exam  Nursing note and vitals reviewed. Constitutional: She is oriented to person, place, and time. She appears well-developed and well-nourished. No distress.  HENT:  Right Ear: External ear normal.  Left Ear: External ear normal.  Mouth/Throat: Uvula is midline, oropharynx is clear and moist and mucous membranes are normal. Abnormal dentition. Dental caries present.    Neck: Normal range of motion. Neck supple.  Cardiovascular: Normal heart sounds.   Pulmonary/Chest: Breath sounds normal. She exhibits tenderness.  Lymphadenopathy:    She has cervical adenopathy.  Neurological: She is alert and oriented to person, place, and time.  Skin: Skin is warm and dry.    ED Course  Procedures (including critical care time) Labs Review Labs Reviewed - No data to display  Imaging Review No results found.   MDM   1. Pain, dental   2. Acute chest wall pain        Billy Fischer, MD 09/13/13 [phone]

## 2014-05-23 ENCOUNTER — Emergency Department (HOSPITAL_COMMUNITY): Payer: Medicaid Other

## 2014-05-23 ENCOUNTER — Encounter (HOSPITAL_COMMUNITY): Payer: Self-pay | Admitting: *Deleted

## 2014-05-23 ENCOUNTER — Emergency Department (HOSPITAL_COMMUNITY)
Admission: EM | Admit: 2014-05-23 | Discharge: 2014-05-23 | Disposition: A | Discharge status: Home / Self Care | Payer: Medicaid Other | Attending: Emergency Medicine | Admitting: Emergency Medicine

## 2014-05-23 DIAGNOSIS — Z8669 Personal history of other diseases of the nervous system and sense organs: Secondary | ICD-10-CM | POA: Insufficient documentation

## 2014-05-23 DIAGNOSIS — Z79899 Other long term (current) drug therapy: Secondary | ICD-10-CM | POA: Insufficient documentation

## 2014-05-23 DIAGNOSIS — K219 Gastro-esophageal reflux disease without esophagitis: Secondary | ICD-10-CM | POA: Insufficient documentation

## 2014-05-23 DIAGNOSIS — I209 Angina pectoris, unspecified: Secondary | ICD-10-CM | POA: Diagnosis not present

## 2014-05-23 DIAGNOSIS — J069 Acute upper respiratory infection, unspecified: Secondary | ICD-10-CM | POA: Diagnosis not present

## 2014-05-23 DIAGNOSIS — Z8661 Personal history of infections of the central nervous system: Secondary | ICD-10-CM | POA: Insufficient documentation

## 2014-05-23 DIAGNOSIS — J45901 Unspecified asthma with (acute) exacerbation: Secondary | ICD-10-CM | POA: Insufficient documentation

## 2014-05-23 DIAGNOSIS — Z880 Allergy status to penicillin: Secondary | ICD-10-CM | POA: Insufficient documentation

## 2014-05-23 DIAGNOSIS — F329 Major depressive disorder, single episode, unspecified: Secondary | ICD-10-CM | POA: Diagnosis not present

## 2014-05-23 DIAGNOSIS — Z862 Personal history of diseases of the blood and blood-forming organs and certain disorders involving the immune mechanism: Secondary | ICD-10-CM | POA: Diagnosis not present

## 2014-05-23 DIAGNOSIS — F419 Anxiety disorder, unspecified: Secondary | ICD-10-CM | POA: Insufficient documentation

## 2014-05-23 DIAGNOSIS — R05 Cough: Secondary | ICD-10-CM | POA: Diagnosis present

## 2014-05-23 MED ORDER — DEXAMETHASONE SODIUM PHOSPHATE 10 MG/ML IJ SOLN
5.0000 mg | Freq: Once | INTRAMUSCULAR | Status: AC
  Administered 2014-05-23: 5 mg via INTRAMUSCULAR
  Filled 2014-05-23: qty 1

## 2014-05-23 MED ORDER — IBUPROFEN 200 MG PO TABS
400.0000 mg | ORAL_TABLET | Freq: Once | ORAL | Status: AC
  Administered 2014-05-23: 400 mg via ORAL
  Filled 2014-05-23: qty 2

## 2014-05-23 NOTE — ED Provider Notes (Signed)
CSN: [id number]     Arrival date & time 05/23/14  9798 History   First MD Initiated Contact with Patient 05/23/14 [phone]     Chief Complaint  Patient presents with  . Cough  . Shortness of Breath  . Sore Throat     (Consider location/radiation/quality/duration/timing/severity/associated sxs/prior Treatment) HPI  [name] is a 34 y.o. female with PMH of GERD, anginal pain, anxiety, depression, borderline personality disorder presenting with complaint of one week of cough productive of thick and at time facial pressure and rhinorrhea that is clear. No fevers. Patient has used her home albuterol inhaler which he states is from asthma without significant relief. Patient has been using honey as well as over-the-counter cold medications with mild relief. Patient denies history of COPD or smoking. No chest pain. No history of DVT, PE, recent surgery,, estrogen use, malignancy.    Past Medical History  Diagnosis Date  . Depression   . GERD (gastroesophageal reflux disease)   . Borderline personality disorder   . Anxiety   . Anginal pain     pt. takes nitrostat- as needed, hasn't had since 04/2012, sees Dr. Terrence Dupont, last ekg was /w PCP- [facility]  . Asthma   . Meningitis     Oct. 2013  . Headache(784.0)   . Neuromuscular disorder     muscle spasms-back & arms   . Anemia     h/o  . Chiari malformation    Past Surgical History  Procedure Laterality Date  . Abdominal hysterectomy    . Peripherally inserted central catheter insertion      during event of Meningitis- then d/c'd to home /w & treated /w antibiotics   . Tubal ligation    . Wisdom tooth extraction    . Vaginal delivery      x3  . Suboccipital craniectomy cervical laminectomy N/A 07/22/2012    Procedure: SUBOCCIPITAL CRANIECTOMY CERVICAL LAMINECTOMY/DURAPLASTY;  Surgeon: Hosie Spangle, MD;  Location: [facility];  Service: Neurosurgery;  Laterality: N/A;  Suboccipital craniectomy with upper cervial laminectomy with  duroplasty    Family History  Problem Relation Age of Onset  . Heart attack Mother    History  Substance Use Topics  . Smoking status: Never Smoker   . Smokeless tobacco: Not on file  . Alcohol Use: No   OB History    No data available     Review of Systems  Constitutional: Negative for fever and chills.  HENT: Positive for congestion, rhinorrhea and sinus pressure.   Respiratory: Positive for cough and shortness of breath.   Cardiovascular: Negative for chest pain and palpitations.  Gastrointestinal: Negative for nausea, vomiting and abdominal pain.  Skin: Negative for rash.      Allergies  Amoxicillin; Imitrex; Penicillins; Norco; Percocet; Soy allergy; and Tramadol  Home Medications   Prior to Admission medications   Medication Sig Start Date End Date Taking? Authorizing Provider  adapalene (DIFFERIN) 0.1 % cream Apply 1 application topically at bedtime.    Historical Provider, MD  albuterol (PROVENTIL HFA;VENTOLIN HFA) 108 (90 BASE) MCG/ACT inhaler Inhale 2 puffs into the lungs every 6 (six) hours as needed for shortness of breath.     Historical Provider, MD  citalopram (CELEXA) 20 MG tablet Take 20 mg by mouth daily.    Historical Provider, MD  clindamycin (CLEOCIN) 300 MG capsule Take 1 capsule (300 mg total) by mouth 3 (three) times daily. 09/13/13   Billy Fischer, MD  diclofenac (CATAFLAM) 50 MG tablet Take  1 tablet (50 mg total) by mouth 3 (three) times daily. For dental pain 09/13/13   Billy Fischer, MD  ibuprofen (ADVIL,MOTRIN) 200 MG tablet Take 800 mg by mouth every 8 (eight) hours as needed for pain.     Historical Provider, MD  ibuprofen (ADVIL,MOTRIN) 600 MG tablet Take 1 tablet (600 mg total) by mouth every 6 (six) hours as needed. 04/08/13   Harvie Heck, PA-C  lamoTRIgine (LAMICTAL) 25 MG tablet Take 25 mg by mouth daily.    Historical Provider, MD  meclizine (ANTIVERT) 25 MG tablet Take 25 mg by mouth 3 (three) times daily as needed for dizziness or  nausea.    Historical Provider, MD  milk thistle 175 MG tablet Take 350 mg by mouth daily.    Historical Provider, MD  nitroGLYCERIN (NITROSTAT) 0.4 MG SL tablet Place 0.4 mg under the tongue every 5 (five) minutes as needed for chest pain (chest pain).    Historical Provider, MD  Olopatadine HCl (PATADAY) 0.2 % SOLN Place 1 drop into both eyes daily.    Historical Provider, MD  pantoprazole (PROTONIX) 20 MG tablet Take 20 mg by mouth daily.    Historical Provider, MD  promethazine (PHENERGAN) 12.5 MG tablet Take 12.5 mg by mouth every 6 (six) hours as needed for nausea or vomiting.    Historical Provider, MD  risperiDONE (RISPERDAL) 0.5 MG tablet Take 0.5 mg by mouth at bedtime.    Historical Provider, MD  Vortioxetine HBr (BRINTELLIX) 10 MG TABS Take 10 mg by mouth daily.    Historical Provider, MD   BP 125/61 mmHg  Pulse 86  Temp(Src) 98.5 F (36.9 C) (Oral)  Resp 14  SpO2 99% Physical Exam  Constitutional: She appears well-developed and well-nourished. No distress.  HENT:  Head: Normocephalic and atraumatic.  Nose: Right sinus exhibits no maxillary sinus tenderness and no frontal sinus tenderness. Left sinus exhibits no maxillary sinus tenderness and no frontal sinus tenderness.  Mouth/Throat: Mucous membranes are normal. Posterior oropharyngeal edema and posterior oropharyngeal erythema present. No oropharyngeal exudate.  Eyes: Conjunctivae and EOM are normal. Right eye exhibits no discharge. Left eye exhibits no discharge.  Neck: Normal range of motion. Neck supple.  Cardiovascular: Normal rate, regular rhythm and normal heart sounds.   Pulmonary/Chest: Effort normal and breath sounds normal. No respiratory distress. She has no wheezes. She has no rales.  Abdominal: Soft. Bowel sounds are normal. She exhibits no distension. There is no tenderness.  Lymphadenopathy:    She has cervical adenopathy.  Neurological: She is alert.  Skin: Skin is warm and dry. She is not diaphoretic.   Nursing note and vitals reviewed.   ED Course  Procedures (including critical care time) Labs Review Labs Reviewed - No data to display  Imaging Review Dg Chest 2 View  05/23/2014   CLINICAL DATA:  Cough and congestion for 1 week.  EXAM: CHEST  2 VIEW  COMPARISON:  August 01, 2012.  FINDINGS: The heart size and mediastinal contours are within normal limits. Both lungs are clear. No pneumothorax or pleural effusion is noted. The visualized skeletal structures are unremarkable.  IMPRESSION: No acute cardiopulmonary abnormality seen.   Electronically Signed   By: Sabino Dick M.D.   On: 05/23/2014 09:23     EKG Interpretation None      MDM   Final diagnoses:  URI (upper respiratory infection)   Agent with history of asthma. Pt CXR negative for acute infiltrate. Patients symptoms are consistent with URI, likely viral  etiology. Discussed that antibiotics are not indicated for viral infections. Pt will be discharged with symptomatic treatment.  Patient with blood mixed in sputum after severe coughing spells. This is likely related to her URI. I doubt PE at this time. No chest pain patient with normal vitals. Low risk by well's. Patient given steroids due to history of asthma. Patient not to take Sudafed due to her taking Celexa. Verbalizes understanding and is agreeable with plan. Pt is hemodynamically stable & in NAD prior to dc.  Discussed return precautions with patient. Discussed all results and patient verbalizes understanding and agrees with plan.    Pura Spice, PA-C 05/23/14 1624  Nat Christen, MD 05/24/14 [phone]

## 2014-05-23 NOTE — ED Notes (Signed)
Pt also c/o facial pressure.

## 2014-05-23 NOTE — ED Notes (Signed)
Patient c/o blood tinged sputum, SOB and a sore throat x 1  1/2 weeks. Patient states she has been using her inhaler,but no relief.

## 2014-05-23 NOTE — Discharge Instructions (Signed)
Return to the emergency room with worsening of symptoms, new symptoms or with symptoms that are concerning , especially fevers, stiff neck, worsening headache, nausea/vomiting, visual changes or slurred speech, chest pain, shortness of breath, cough with thick colored mucous or blood. Drink plenty of fluids with electrolytes especially Gatorade. OTC cold medications such as mucinex, nyquil, dayquil are recommended. Chloraseptic for sore throat. Take  Continue to use albuterol inhaler for SOB. Start to take Allegra daily for 1 month. Follow-up with your primary care provider if the symptoms are persistent. You have 10 days of symptoms.  Do not take sudafed, pseudoephedrine   Asthma Asthma is a recurring condition in which the airways tighten and narrow. Asthma can make it difficult to breathe. It can cause coughing, wheezing, and shortness of breath. Asthma episodes, also called asthma attacks, range from minor to life-threatening. Asthma cannot be cured, but medicines and lifestyle changes can help control it. CAUSES Asthma is believed to be caused by inherited (genetic) and environmental factors, but its exact cause is unknown. Asthma may be triggered by allergens, lung infections, or irritants in the air. Asthma triggers are different for each person. Common triggers include:   Animal dander.  Dust mites.  Cockroaches.  Pollen from trees or grass.  Mold.  Smoke.  Air pollutants such as dust, household cleaners, hair sprays, aerosol sprays, paint fumes, strong chemicals, or strong odors.  Cold air, weather changes, and winds (which increase molds and pollens in the air).  Strong emotional expressions such as crying or laughing hard.  Stress.  Certain medicines (such as aspirin) or types of drugs (such as beta-blockers).  Sulfites in foods and drinks. Foods and drinks that may contain sulfites include dried fruit, potato chips, and sparkling grape juice.  Infections or  inflammatory conditions such as the flu, a cold, or an inflammation of the nasal membranes (rhinitis).  Gastroesophageal reflux disease (GERD).  Exercise or strenuous activity. SYMPTOMS Symptoms may occur immediately after asthma is triggered or many hours later. Symptoms include:  Wheezing.  Excessive nighttime or early morning coughing.  Frequent or severe coughing with a common cold.  Chest tightness.  Shortness of breath. DIAGNOSIS  The diagnosis of asthma is made by a review of your medical history and a physical exam. Tests may also be performed. These may include:  Lung function studies. These tests show how much air you breathe in and out.  Allergy tests.  Imaging tests such as X-rays. TREATMENT  Asthma cannot be cured, but it can usually be controlled. Treatment involves identifying and avoiding your asthma triggers. It also involves medicines. There are 2 classes of medicine used for asthma treatment:   Controller medicines. These prevent asthma symptoms from occurring. They are usually taken every day.  Reliever or rescue medicines. These quickly relieve asthma symptoms. They are used as needed and provide short-term relief. Your health care provider will help you create an asthma action plan. An asthma action plan is a written plan for managing and treating your asthma attacks. It includes a list of your asthma triggers and how they may be avoided. It also includes information on when medicines should be taken and when their dosage should be changed. An action plan may also involve the use of a device called a peak flow meter. A peak flow meter measures how well the lungs are working. It helps you monitor your condition. HOME CARE INSTRUCTIONS   Take medicines only as directed by your health care provider. Speak with your  health care provider if you have questions about how or when to take the medicines.  Use a peak flow meter as directed by your health care provider.  Record and keep track of readings.  Understand and use the action plan to help minimize or stop an asthma attack without needing to seek medical care.  Control your home environment in the following ways to help prevent asthma attacks:  Do not smoke. Avoid being exposed to secondhand smoke.  Change your heating and air conditioning filter regularly.  Limit your use of fireplaces and wood stoves.  Get rid of pests (such as roaches and mice) and their droppings.  Throw away plants if you see mold on them.  Clean your floors and dust regularly. Use unscented cleaning products.  Try to have someone else vacuum for you regularly. Stay out of rooms while they are being vacuumed and for a short while afterward. If you vacuum, use a dust mask from a hardware store, a double-layered or microfilter vacuum cleaner bag, or a vacuum cleaner with a HEPA filter.  Replace carpet with wood, tile, or vinyl flooring. Carpet can trap dander and dust.  Use allergy-proof pillows, mattress covers, and box spring covers.  Wash bed sheets and blankets every week in hot water and dry them in a dryer.  Use blankets that are made of polyester or cotton.  Clean bathrooms and kitchens with bleach. If possible, have someone repaint the walls in these rooms with mold-resistant paint. Keep out of the rooms that are being cleaned and painted.  Wash hands frequently. SEEK MEDICAL CARE IF:   You have wheezing, shortness of breath, or a cough even if taking medicine to prevent attacks.  The colored mucus you cough up (sputum) is thicker than usual.  Your sputum changes from clear or white to yellow, green, gray, or bloody.  You have any problems that may be related to the medicines you are taking (such as a rash, itching, swelling, or trouble breathing).  You are using a reliever medicine more than 2-3 times per week.  Your peak flow is still at 50-79% of your personal best after following your action plan for  1 hour.  You have a fever. SEEK IMMEDIATE MEDICAL CARE IF:   You seem to be getting worse and are unresponsive to treatment during an asthma attack.  You are short of breath even at rest.  You get short of breath when doing very little physical activity.  You have difficulty eating, drinking, or talking due to asthma symptoms.  You develop chest pain.  You develop a fast heartbeat.  You have a bluish color to your lips or fingernails.  You are light-headed, dizzy, or faint.  Your peak flow is less than 50% of your personal best. MAKE SURE YOU:   Understand these instructions.  Will watch your condition.  Will get help right away if you are not doing well or get worse. Document Released: 04/10/2005 Document Revised: 08/25/2013 Document Reviewed: 11/07/2012 [facility] Patient Information 2015 [place], [place]. This information is not intended to replace advice given to you by your health care provider. Make sure you discuss any questions you have with your health care provider.    Upper Respiratory Infection, Adult An upper respiratory infection (URI) is also sometimes known as the common cold. The upper respiratory tract includes the nose, sinuses, throat, trachea, and bronchi. Bronchi are the airways leading to the lungs. Most people improve within 1 week, but symptoms can last up  to 2 weeks. A residual cough may last even longer.  CAUSES Many different viruses can infect the tissues lining the upper respiratory tract. The tissues become irritated and inflamed and often become very moist. Mucus production is also common. A cold is contagious. You can easily spread the virus to others by oral contact. This includes kissing, sharing a glass, coughing, or sneezing. Touching your mouth or nose and then touching a surface, which is then touched by another person, can also spread the virus. SYMPTOMS  Symptoms typically develop 1 to 3 days after you come in contact with a cold virus.  Symptoms vary from person to person. They may include:  Runny nose.  Sneezing.  Nasal congestion.  Sinus irritation.  Sore throat.  Loss of voice (laryngitis).  Cough.  Fatigue.  Muscle aches.  Loss of appetite.  Headache.  Low-grade fever. DIAGNOSIS  You might diagnose your own cold based on familiar symptoms, since most people get a cold 2 to 3 times a year. Your caregiver can confirm this based on your exam. Most importantly, your caregiver can check that your symptoms are not due to another disease such as strep throat, sinusitis, pneumonia, asthma, or epiglottitis. Blood tests, throat tests, and X-rays are not necessary to diagnose a common cold, but they may sometimes be helpful in excluding other more serious diseases. Your caregiver will decide if any further tests are required. RISKS AND COMPLICATIONS  You may be at risk for a more severe case of the common cold if you smoke cigarettes, have chronic heart disease (such as heart failure) or lung disease (such as asthma), or if you have a weakened immune system. The very young and very old are also at risk for more serious infections. Bacterial sinusitis, middle ear infections, and bacterial pneumonia can complicate the common cold. The common cold can worsen asthma and chronic obstructive pulmonary disease (COPD). Sometimes, these complications can require emergency medical care and may be life-threatening. PREVENTION  The best way to protect against getting a cold is to practice good hygiene. Avoid oral or hand contact with people with cold symptoms. Wash your hands often if contact occurs. There is no clear evidence that vitamin C, vitamin E, echinacea, or exercise reduces the chance of developing a cold. However, it is always recommended to get plenty of rest and practice good nutrition. TREATMENT  Treatment is directed at relieving symptoms. There is no cure. Antibiotics are not effective, because the infection is caused  by a virus, not by bacteria. Treatment may include:  Increased fluid intake. Sports drinks offer valuable electrolytes, sugars, and fluids.  Breathing heated mist or steam (vaporizer or shower).  Eating chicken soup or other clear broths, and maintaining good nutrition.  Getting plenty of rest.  Using gargles or lozenges for comfort.  Controlling fevers with ibuprofen or acetaminophen as directed by your caregiver.  Increasing usage of your inhaler if you have asthma. Zinc gel and zinc lozenges, taken in the first 24 hours of the common cold, can shorten the duration and lessen the severity of symptoms. Pain medicines may help with fever, muscle aches, and throat pain. A variety of non-prescription medicines are available to treat congestion and runny nose. Your caregiver can make recommendations and may suggest nasal or lung inhalers for other symptoms.  HOME CARE INSTRUCTIONS   Only take over-the-counter or prescription medicines for pain, discomfort, or fever as directed by your caregiver.  Use a warm mist humidifier or inhale steam from a shower  to increase air moisture. This may keep secretions moist and make it easier to breathe.  Drink enough water and fluids to keep your urine clear or pale yellow.  Rest as needed.  Return to work when your temperature has returned to normal or as your caregiver advises. You may need to stay home longer to avoid infecting others. You can also use a face mask and careful hand washing to prevent spread of the virus. SEEK MEDICAL CARE IF:   After the first few days, you feel you are getting worse rather than better.  You need your caregiver's advice about medicines to control symptoms.  You develop chills, worsening shortness of breath, or brown or red sputum. These may be signs of pneumonia.  You develop yellow or brown nasal discharge or pain in the face, especially when you bend forward. These may be signs of sinusitis.  You develop a  fever, swollen neck glands, pain with swallowing, or white areas in the back of your throat. These may be signs of strep throat. SEEK IMMEDIATE MEDICAL CARE IF:   You have a fever.  You develop severe or persistent headache, ear pain, sinus pain, or chest pain.  You develop wheezing, a prolonged cough, cough up blood, or have a change in your usual mucus (if you have chronic lung disease).  You develop sore muscles or a stiff neck. Document Released: 10/04/2000 Document Revised: 07/03/2011 Document Reviewed: 07/16/2013 [facility] Patient Information 2015 [place], [place]. This information is not intended to replace advice given to you by your health care provider. Make sure you discuss any questions you have with your health care provider.

## 2014-10-02 ENCOUNTER — Emergency Department (HOSPITAL_COMMUNITY)
Admission: EM | Admit: 2014-10-02 | Discharge: 2014-10-02 | Disposition: A | Discharge status: Home / Self Care | Payer: Medicaid Other | Attending: Emergency Medicine | Admitting: Emergency Medicine

## 2014-10-02 ENCOUNTER — Emergency Department (HOSPITAL_COMMUNITY): Payer: Medicaid Other

## 2014-10-02 ENCOUNTER — Encounter (HOSPITAL_COMMUNITY): Payer: Self-pay | Admitting: Emergency Medicine

## 2014-10-02 DIAGNOSIS — Z880 Allergy status to penicillin: Secondary | ICD-10-CM | POA: Diagnosis not present

## 2014-10-02 DIAGNOSIS — J45909 Unspecified asthma, uncomplicated: Secondary | ICD-10-CM | POA: Diagnosis not present

## 2014-10-02 DIAGNOSIS — Z8719 Personal history of other diseases of the digestive system: Secondary | ICD-10-CM | POA: Diagnosis not present

## 2014-10-02 DIAGNOSIS — Z862 Personal history of diseases of the blood and blood-forming organs and certain disorders involving the immune mechanism: Secondary | ICD-10-CM | POA: Insufficient documentation

## 2014-10-02 DIAGNOSIS — Z8669 Personal history of other diseases of the nervous system and sense organs: Secondary | ICD-10-CM | POA: Insufficient documentation

## 2014-10-02 DIAGNOSIS — R079 Chest pain, unspecified: Secondary | ICD-10-CM

## 2014-10-02 DIAGNOSIS — R109 Unspecified abdominal pain: Secondary | ICD-10-CM | POA: Insufficient documentation

## 2014-10-02 DIAGNOSIS — Z87798 Personal history of other (corrected) congenital malformations: Secondary | ICD-10-CM | POA: Insufficient documentation

## 2014-10-02 DIAGNOSIS — R0789 Other chest pain: Secondary | ICD-10-CM | POA: Diagnosis not present

## 2014-10-02 DIAGNOSIS — R42 Dizziness and giddiness: Secondary | ICD-10-CM | POA: Diagnosis not present

## 2014-10-02 DIAGNOSIS — R51 Headache: Secondary | ICD-10-CM | POA: Diagnosis not present

## 2014-10-02 DIAGNOSIS — R531 Weakness: Secondary | ICD-10-CM | POA: Diagnosis present

## 2014-10-02 LAB — I-STAT TROPONIN, ED: Troponin i, poc: 0 ng/mL (ref 0.00–0.08)

## 2014-10-02 LAB — COMPREHENSIVE METABOLIC PANEL
ALT: 12 U/L — ABNORMAL LOW (ref 14–54)
AST: 19 U/L (ref 15–41)
Albumin: 4.2 g/dL (ref 3.5–5.0)
Alkaline Phosphatase: 82 U/L (ref 38–126)
Anion gap: 7 (ref 5–15)
BUN: 6 mg/dL (ref 6–20)
CO2: 25 mmol/L (ref 22–32)
Calcium: 9.4 mg/dL (ref 8.9–10.3)
Chloride: 105 mmol/L (ref 101–111)
Creatinine, Ser: 0.64 mg/dL (ref 0.44–1.00)
GFR calc Af Amer: >60 mL/min (ref >60)
GFR calc non Af Amer: >60 mL/min (ref >60)
Glucose, Bld: 87 mg/dL (ref 65–99)
Potassium: 3.9 mmol/L (ref 3.5–5.1)
Sodium: 137 mmol/L (ref 135–145)
Total Bilirubin: 0.4 mg/dL (ref 0.3–1.2)
Total Protein: 7.5 g/dL (ref 6.5–8.1)

## 2014-10-02 LAB — CBC WITH DIFFERENTIAL/PLATELET
Basophils Absolute: 0 10*3/uL (ref 0.0–0.1)
Basophils Relative: 0 % (ref 0–1)
Eosinophils Absolute: 0.2 10*3/uL (ref 0.0–0.7)
Eosinophils Relative: 3 % (ref 0–5)
HCT: 37.9 % (ref 36.0–46.0)
Hemoglobin: 12.3 g/dL (ref 12.0–15.0)
Lymphocytes Relative: 35 % (ref 12–46)
Lymphs Abs: 1.7 10*3/uL (ref 0.7–4.0)
MCH: 28.5 pg (ref 26.0–34.0)
MCHC: 32.5 g/dL (ref 30.0–36.0)
MCV: 87.7 fL (ref 78.0–100.0)
Monocytes Absolute: 0.4 10*3/uL (ref 0.1–1.0)
Monocytes Relative: 8 % (ref 3–12)
Neutro Abs: 2.7 10*3/uL (ref 1.7–7.7)
Neutrophils Relative %: 54 % (ref 43–77)
Platelets: 253 10*3/uL (ref 150–400)
RBC: 4.32 MIL/uL (ref 3.87–5.11)
RDW: 12.3 % (ref 11.5–15.5)
WBC: 5 10*3/uL (ref 4.0–10.5)

## 2014-10-02 MED ORDER — DIPHENHYDRAMINE HCL 50 MG/ML IJ SOLN
12.5000 mg | Freq: Once | INTRAMUSCULAR | Status: AC
  Administered 2014-10-02: 12.5 mg via INTRAVENOUS
  Filled 2014-10-02: qty 1

## 2014-10-02 MED ORDER — METOCLOPRAMIDE HCL 5 MG/ML IJ SOLN
5.0000 mg | Freq: Once | INTRAMUSCULAR | Status: AC
  Administered 2014-10-02: 5 mg via INTRAVENOUS
  Filled 2014-10-02: qty 2

## 2014-10-02 MED ORDER — SODIUM CHLORIDE 0.9 % IV BOLUS (SEPSIS)
1000.0000 mL | INTRAVENOUS | Status: AC
  Administered 2014-10-02: 1000 mL via INTRAVENOUS

## 2014-10-02 MED ORDER — FENTANYL CITRATE (PF) 100 MCG/2ML IJ SOLN
50.0000 ug | Freq: Once | INTRAMUSCULAR | Status: AC
  Administered 2014-10-02: 50 ug via INTRAVENOUS
  Filled 2014-10-02: qty 2

## 2014-10-02 NOTE — ED Notes (Signed)
Per EMS: pt c/o weakness and abd pain, states she has anxiety and has been stressed lately. Vitals and EKG WDL.

## 2014-10-02 NOTE — Discharge Instructions (Signed)

## 2014-10-02 NOTE — ED Notes (Signed)
Bed: WA02 Expected date:  Expected time:  Means of arrival:  Comments: EMS 

## 2014-10-02 NOTE — ED Provider Notes (Signed)
CSN: [id number]     Arrival date & time 10/02/14  1124 History   First MD Initiated Contact with Patient 10/02/14 1205     Chief Complaint  Patient presents with  . Weakness  . Abdominal Pain     (Consider location/radiation/quality/duration/timing/severity/associated sxs/prior Treatment) Patient is a 34 y.o. female presenting with weakness, abdominal pain, and chest pain.  Weakness This is a new problem. The current episode started 6 to 12 hours ago. The problem occurs constantly. The problem has not changed since onset.Associated symptoms include chest pain and headaches. Pertinent negatives include no abdominal pain and no shortness of breath. The symptoms are aggravated by walking. Nothing relieves the symptoms. She has tried nothing for the symptoms. The treatment provided no relief.  Abdominal Pain Associated symptoms: chest pain   Associated symptoms: no cough, no diarrhea, no dysuria, no fatigue, no fever, no hematuria, no nausea, no shortness of breath and no vomiting   Chest Pain Pain location:  Substernal area Pain quality: sharp   Pain radiates to:  L arm Pain radiates to the back: no   Pain severity:  Mild Onset quality:  Sudden Duration:  2 hours Timing:  Constant Progression:  Unchanged Chronicity:  New Context: at rest   Relieved by:  Nothing Worsened by:  Nothing tried Ineffective treatments:  None tried Associated symptoms: headache and weakness   Associated symptoms: no abdominal pain, no back pain, no cough, no dizziness, no fatigue, no fever, no nausea, no shortness of breath and not vomiting     Past Medical History  Diagnosis Date  . Depression   . GERD (gastroesophageal reflux disease)   . Borderline personality disorder   . Anxiety   . Anginal pain     pt. takes nitrostat- as needed, hasn't had since 04/2012, sees Dr. Terrence Dupont, last ekg was /w PCP- [facility]  . Asthma   . Meningitis     Oct. 2013  . Headache(784.0)   . Neuromuscular disorder    muscle spasms-back & arms   . Anemia     h/o  . Chiari malformation    Past Surgical History  Procedure Laterality Date  . Abdominal hysterectomy    . Peripherally inserted central catheter insertion      during event of Meningitis- then d/c'd to home /w & treated /w antibiotics   . Tubal ligation    . Wisdom tooth extraction    . Vaginal delivery      x3  . Suboccipital craniectomy cervical laminectomy N/A 07/22/2012    Procedure: SUBOCCIPITAL CRANIECTOMY CERVICAL LAMINECTOMY/DURAPLASTY;  Surgeon: Hosie Spangle, MD;  Location: [facility];  Service: Neurosurgery;  Laterality: N/A;  Suboccipital craniectomy with upper cervial laminectomy with duroplasty    Family History  Problem Relation Age of Onset  . Heart attack Mother    History  Substance Use Topics  . Smoking status: Never Smoker   . Smokeless tobacco: Not on file  . Alcohol Use: No   OB History    No data available     Review of Systems  Constitutional: Negative for fever and fatigue.  HENT: Negative for congestion and drooling.   Eyes: Negative for pain.  Respiratory: Negative for cough and shortness of breath.   Cardiovascular: Positive for chest pain.  Gastrointestinal: Negative for nausea, vomiting, abdominal pain and diarrhea.  Genitourinary: Negative for dysuria and hematuria.  Musculoskeletal: Negative for back pain, gait problem and neck pain.  Skin: Negative for color change.  Neurological: Positive for  weakness, light-headedness and headaches. Negative for dizziness.  Hematological: Negative for adenopathy.  Psychiatric/Behavioral: Negative for behavioral problems.  All other systems reviewed and are negative.     Allergies  Amoxicillin; Imitrex; Penicillins; Norco; Percocet; Soy allergy; and Tramadol  Home Medications   Prior to Admission medications   Medication Sig Start Date End Date Taking? Authorizing Provider  adapalene (DIFFERIN) 0.1 % cream Apply 1 application topically at  bedtime.   Yes Historical Provider, MD  albuterol (PROVENTIL HFA;VENTOLIN HFA) 108 (90 BASE) MCG/ACT inhaler Inhale 2 puffs into the lungs every 6 (six) hours as needed for shortness of breath.    Yes Historical Provider, MD  citalopram (CELEXA) 20 MG tablet Take 20 mg by mouth daily.   Yes Historical Provider, MD  cyclobenzaprine (FLEXERIL) 10 MG tablet Take 10 mg by mouth at bedtime as needed for muscle spasms.  07/28/14  Yes Historical Provider, MD  diclofenac (CATAFLAM) 50 MG tablet Take 1 tablet (50 mg total) by mouth 3 (three) times daily. For dental pain 09/13/13  Yes Billy Fischer, MD  ibuprofen (ADVIL,MOTRIN) 200 MG tablet Take 800 mg by mouth every 8 (eight) hours as needed for pain.    Yes Historical Provider, MD  ibuprofen (ADVIL,MOTRIN) 600 MG tablet Take 1 tablet (600 mg total) by mouth every 6 (six) hours as needed. 04/08/13  Yes Harvie Heck, PA-C  lamoTRIgine (LAMICTAL) 150 MG tablet Take 150 mg by mouth 2 (two) times daily.   Yes Historical Provider, MD  meclizine (ANTIVERT) 25 MG tablet Take 25 mg by mouth 3 (three) times daily as needed for dizziness or nausea.   Yes Historical Provider, MD  Olopatadine HCl (PATADAY) 0.2 % SOLN Place 1 drop into both eyes daily.   Yes Historical Provider, MD  pantoprazole (PROTONIX) 20 MG tablet Take 20 mg by mouth daily.   Yes Historical Provider, MD  promethazine (PHENERGAN) 12.5 MG tablet Take 12.5 mg by mouth every 6 (six) hours as needed for nausea or vomiting.   Yes Historical Provider, MD  clindamycin (CLEOCIN) 300 MG capsule Take 1 capsule (300 mg total) by mouth 3 (three) times daily. Patient not taking: Reported on 10/02/2014 09/13/13   Billy Fischer, MD   BP 111/63 mmHg  Pulse 64  Temp(Src) 98.7 F (37.1 C) (Oral)  Resp 16  Ht 5\' 5"  (1.651 m)  Wt 184 lb (83.462 kg)  BMI 30.62 kg/m2  SpO2 100% Physical Exam  Constitutional: She is oriented to person, place, and time. She appears well-developed and well-nourished.  HENT:  Head:  Normocephalic and atraumatic.  Mouth/Throat: Oropharynx is clear and moist. No oropharyngeal exudate.  Eyes: Conjunctivae and EOM are normal. Pupils are equal, round, and reactive to light.  Neck: Normal range of motion. Neck supple.  Cardiovascular: Normal rate, regular rhythm, normal heart sounds and intact distal pulses.  Exam reveals no gallop and no friction rub.   No murmur heard. Pulmonary/Chest: Effort normal and breath sounds normal. No respiratory distress. She has no wheezes.  Abdominal: Soft. Bowel sounds are normal. There is no tenderness. There is no rebound and no guarding.  Musculoskeletal: Normal range of motion. She exhibits no edema or tenderness.  Neurological: She is alert and oriented to person, place, and time.  alert, oriented x3 speech: normal in context and clarity memory: intact grossly cranial nerves II-XII: intact motor strength: full proximally and distally no involuntary movements or tremors sensation: intact to light touch diffusely  cerebellar: finger-to-nose and heel-to-shin intact gait: normal  forwards and backwards, reproduction of lightheadedness upon standing.  Skin: Skin is warm and dry.  Psychiatric: She has a normal mood and affect. Her behavior is normal.  Nursing note and vitals reviewed.   ED Course  Procedures (including critical care time) Labs Review Labs Reviewed  COMPREHENSIVE METABOLIC PANEL - Abnormal; Notable for the following:    ALT 12 (*)    All other components within normal limits  CBC WITH DIFFERENTIAL/PLATELET  I-STAT TROPOININ, ED    Imaging Review Dg Chest 2 View  10/02/2014   CLINICAL DATA:  Chest pain radiating into the left arm for 2 days  EXAM: CHEST - 2 VIEW  COMPARISON:  05/23/2014  FINDINGS: The heart size and mediastinal contours are within normal limits. Both lungs are clear. The visualized skeletal structures are unremarkable.  IMPRESSION: No active disease.   Electronically Signed   By: Inez Catalina M.D.    On: 10/02/2014 13:05     EKG Interpretation   Date/Time:  Friday October 02 2014 11:30:37 EDT Ventricular Rate:  66 PR Interval:  178 QRS Duration: 92 QT Interval:  387 QTC Calculation: 405 R Axis:   -18 Text Interpretation:  Sinus rhythm Borderline left axis deviation Anterior  infarct, old No significant change since last tracing Confirmed by  Brynli Ollis  MD, Alexey Rhoads ([id number]) on 10/02/2014 12:20:43 PM      MDM   Final diagnoses:  Chest pain  Lightheadedness    12:27 PM 34 y.o. female w hx of Chiari malformation status post suboccipital craniectomy and cervical laminectomy who presents with multiple complaints. She states that she has had generalized weakness and lightheadedness since yesterday evening. She developed some central sharp chest pain radiating to her left arm around 1045 today while in a car. It has been persistent since that time. She also has some mild shortness of breath. She states that about an hour after waking this morning she developed a gradual onset right-sided headache consistent with previous migraines. She is taking ibuprofen for this. She is afebrile and vital signs are unremarkable here. Wells/perc neg. She denies hx of PE/DVT. We'll get screening labs and imaging. She has normal neurologic exam.  2:53 PM: I interpreted/reviewed the labs and/or imaging which were non-contributory.  Pt feeling better. Low risk for MACE per HEART score. Do not think her sx are related to dissection or PE. Pt well appearing on exam.  I have discussed the diagnosis/risks/treatment options with the patient and believe the pt to be eligible for discharge home to follow-up with her pcp as needed. We also discussed returning to the ED immediately if new or worsening sx occur. We discussed the sx which are most concerning (e.g., worsening cp, sob, HA, fever) that necessitate immediate return. Medications administered to the patient during their visit and any new prescriptions provided to the  patient are listed below.  Medications given during this visit Medications  sodium chloride 0.9 % bolus 1,000 mL (1,000 mLs Intravenous New Bag/Given 10/02/14 1243)  metoCLOPramide (REGLAN) injection 5 mg (5 mg Intravenous Given 10/02/14 1243)  diphenhydrAMINE (BENADRYL) injection 12.5 mg (12.5 mg Intravenous Given 10/02/14 1243)  fentaNYL (SUBLIMAZE) injection 50 mcg (50 mcg Intravenous Given 10/02/14 1243)    New Prescriptions   No medications on file     Pamella Pert, MD 10/02/14 1559

## 2015-08-21 ENCOUNTER — Encounter (HOSPITAL_COMMUNITY): Payer: Self-pay

## 2015-08-21 ENCOUNTER — Emergency Department (HOSPITAL_COMMUNITY): Payer: Medicaid Other

## 2015-08-21 ENCOUNTER — Emergency Department (HOSPITAL_COMMUNITY)
Admission: EM | Admit: 2015-08-21 | Discharge: 2015-08-21 | Disposition: A | Discharge status: Home / Self Care | Payer: Medicaid Other | Attending: Emergency Medicine | Admitting: Emergency Medicine

## 2015-08-21 DIAGNOSIS — F329 Major depressive disorder, single episode, unspecified: Secondary | ICD-10-CM | POA: Diagnosis not present

## 2015-08-21 DIAGNOSIS — Z8661 Personal history of infections of the central nervous system: Secondary | ICD-10-CM | POA: Insufficient documentation

## 2015-08-21 DIAGNOSIS — K219 Gastro-esophageal reflux disease without esophagitis: Secondary | ICD-10-CM | POA: Insufficient documentation

## 2015-08-21 DIAGNOSIS — J029 Acute pharyngitis, unspecified: Secondary | ICD-10-CM | POA: Diagnosis present

## 2015-08-21 DIAGNOSIS — J209 Acute bronchitis, unspecified: Secondary | ICD-10-CM | POA: Insufficient documentation

## 2015-08-21 DIAGNOSIS — F419 Anxiety disorder, unspecified: Secondary | ICD-10-CM | POA: Diagnosis not present

## 2015-08-21 DIAGNOSIS — J45901 Unspecified asthma with (acute) exacerbation: Secondary | ICD-10-CM | POA: Insufficient documentation

## 2015-08-21 DIAGNOSIS — Z862 Personal history of diseases of the blood and blood-forming organs and certain disorders involving the immune mechanism: Secondary | ICD-10-CM | POA: Diagnosis not present

## 2015-08-21 DIAGNOSIS — Z79899 Other long term (current) drug therapy: Secondary | ICD-10-CM | POA: Insufficient documentation

## 2015-08-21 DIAGNOSIS — Z791 Long term (current) use of non-steroidal anti-inflammatories (NSAID): Secondary | ICD-10-CM | POA: Insufficient documentation

## 2015-08-21 DIAGNOSIS — Z880 Allergy status to penicillin: Secondary | ICD-10-CM | POA: Diagnosis not present

## 2015-08-21 DIAGNOSIS — Z8669 Personal history of other diseases of the nervous system and sense organs: Secondary | ICD-10-CM | POA: Insufficient documentation

## 2015-08-21 DIAGNOSIS — R630 Anorexia: Secondary | ICD-10-CM | POA: Insufficient documentation

## 2015-08-21 DIAGNOSIS — Q0700 Arnold-Chiari syndrome without spina bifida or hydrocephalus: Secondary | ICD-10-CM | POA: Diagnosis not present

## 2015-08-21 DIAGNOSIS — H9203 Otalgia, bilateral: Secondary | ICD-10-CM | POA: Insufficient documentation

## 2015-08-21 LAB — RAPID STREP SCREEN (MED CTR MEBANE ONLY): Streptococcus, Group A Screen (Direct): NEGATIVE

## 2015-08-21 MED ORDER — IBUPROFEN 200 MG PO TABS
400.0000 mg | ORAL_TABLET | Freq: Once | ORAL | Status: AC
  Administered 2015-08-21: 400 mg via ORAL
  Filled 2015-08-21: qty 2

## 2015-08-21 MED ORDER — PREDNISONE 50 MG PO TABS: ORAL_TABLET | ORAL | Status: DC

## 2015-08-21 MED ORDER — ALBUTEROL SULFATE HFA 108 (90 BASE) MCG/ACT IN AERS
2.0000 | INHALATION_SPRAY | Freq: Once | RESPIRATORY_TRACT | Status: AC
  Administered 2015-08-21: 2 via RESPIRATORY_TRACT
  Filled 2015-08-21: qty 6.7

## 2015-08-21 NOTE — Progress Notes (Signed)
Pt c/o coughing up thick yellow sputum and having to use her inhaler more frequently. Pt also c/o bilateral ear pain. No rales/wheezes noted in any lung fields.

## 2015-08-21 NOTE — ED Notes (Signed)
She c/o uri/sore throat since this Wed.  She is in no distress.

## 2015-08-21 NOTE — ED Provider Notes (Signed)
CSN: [id number]     Arrival date & time 08/21/15  1340 History  By signing my name below, I, [name], attest that this documentation has been prepared under the direction and in the presence of non-physician practitioner, Monico Blitz, PA-C. Electronically Signed: Dora Brown, Scribe. 08/21/2015. 2:58 PM.   Chief Complaint  Patient presents with  . Sore Throat  . URI    The history is provided by the patient and a relative. No language interpreter was used.     HPI Comments: [name] is a 35 y.o. female with h/o asthma who presents to the Emergency Department complaining of sudden onset, constant, URI symptoms beginning 2 days ago. Pt endorses productive cough with flem, chest pain exacerbated by cough and deep inhalation, sore throat, mild fever, SOB with ambulation, nasal congestion, bilateral ear pain, and decreased appetite and fluid intake. Pt also notes abdominal pain and believes it could be a side effect of her new Depakote medication. She has been using Allegra D since yesterday with no relief of her symptoms. She has no known sick contacts. Pt is not on birth control; she has h/o hysterectomy. She has no h/o blood clots in her legs or lungs. Pt denies recent long travel or immobilization. She denies leg swelling, vomiting, rhinorrhea, palpitations, or any other associated symptoms.  Past Medical History  Diagnosis Date  . Depression   . GERD (gastroesophageal reflux disease)   . Borderline personality disorder   . Anxiety   . Anginal pain ([place])     pt. takes nitrostat- as needed, hasn't had since 04/2012, sees Dr. Terrence Dupont, last ekg was /w PCP- [facility]re  . Asthma   . Meningitis     Oct. 2013  . Headache(784.0)   . Neuromuscular disorder ([facility])     muscle spasms-back & arms   . Anemia     h/o  . Chiari malformation    Past Surgical History  Procedure Laterality Date  . Abdominal hysterectomy    . Peripherally inserted central catheter insertion       during event of Meningitis- then d/c'd to home /w & treated /w antibiotics   . Tubal ligation    . Wisdom tooth extraction    . Vaginal delivery      x3  . Suboccipital craniectomy cervical laminectomy N/A 07/22/2012    Procedure: SUBOCCIPITAL CRANIECTOMY CERVICAL LAMINECTOMY/DURAPLASTY;  Surgeon: Hosie Spangle, MD;  Location: [facility];  Service: Neurosurgery;  Laterality: N/A;  Suboccipital craniectomy with upper cervial laminectomy with duroplasty    Family History  Problem Relation Age of Onset  . Heart attack Mother    Social History  Substance Use Topics  . Smoking status: Never Smoker   . Smokeless tobacco: None  . Alcohol Use: No   OB History    No data available     Review of Systems  A complete 10 system review of systems was obtained and all systems are negative except as noted in the HPI and PMH.   Allergies  Amoxicillin; Imitrex; Penicillins; Norco; Percocet; Soy allergy; and Tramadol  Home Medications   Prior to Admission medications   Medication Sig Start Date End Date Taking? Authorizing Provider  adapalene (DIFFERIN) 0.1 % cream Apply 1 application topically at bedtime.    Historical Provider, MD  albuterol (PROVENTIL HFA;VENTOLIN HFA) 108 (90 BASE) MCG/ACT inhaler Inhale 2 puffs into the lungs every 6 (six) hours as needed for shortness of breath.     Historical Provider, MD  citalopram (CELEXA) 20 MG tablet Take 20 mg by mouth daily.    Historical Provider, MD  clindamycin (CLEOCIN) 300 MG capsule Take 1 capsule (300 mg total) by mouth 3 (three) times daily. Patient not taking: Reported on 10/02/2014 09/13/13   Billy Fischer, MD  cyclobenzaprine (FLEXERIL) 10 MG tablet Take 10 mg by mouth at bedtime as needed for muscle spasms.  07/28/14   Historical Provider, MD  diclofenac (CATAFLAM) 50 MG tablet Take 1 tablet (50 mg total) by mouth 3 (three) times daily. For dental pain 09/13/13   Billy Fischer, MD  ibuprofen (ADVIL,MOTRIN) 200 MG tablet Take 800 mg by  mouth every 8 (eight) hours as needed for pain.     Historical Provider, MD  ibuprofen (ADVIL,MOTRIN) 600 MG tablet Take 1 tablet (600 mg total) by mouth every 6 (six) hours as needed. 04/08/13   Harvie Heck, PA-C  lamoTRIgine (LAMICTAL) 150 MG tablet Take 150 mg by mouth 2 (two) times daily.    Historical Provider, MD  meclizine (ANTIVERT) 25 MG tablet Take 25 mg by mouth 3 (three) times daily as needed for dizziness or nausea.    Historical Provider, MD  Olopatadine HCl (PATADAY) 0.2 % SOLN Place 1 drop into both eyes daily.    Historical Provider, MD  pantoprazole (PROTONIX) 20 MG tablet Take 20 mg by mouth daily.    Historical Provider, MD  predniSONE (DELTASONE) 50 MG tablet Take 1 tablet daily with breakfast 08/21/15   Elmyra Ricks Kross Swallows, PA-C  promethazine (PHENERGAN) 12.5 MG tablet Take 12.5 mg by mouth every 6 (six) hours as needed for nausea or vomiting.    Historical Provider, MD   BP 116/56 mmHg  Pulse 88  Temp(Src) 97.9 F (36.6 C) (Oral)  Resp 18  SpO2 100% Physical Exam  Constitutional: She is oriented to person, place, and time. She appears well-developed and well-nourished. No distress.  HENT:  Head: Normocephalic and atraumatic.  Mouth/Throat: Oropharynx is clear and moist.  Eyes: Conjunctivae and EOM are normal.  Neck: Normal range of motion. Neck supple. No JVD present. No tracheal deviation present.  Cardiovascular: Normal rate, regular rhythm and intact distal pulses.   Radial pulse equal bilaterally  Pulmonary/Chest: Effort normal and breath sounds normal. No stridor. No respiratory distress. She has no wheezes. She has no rales. She exhibits no tenderness.  Abdominal: Soft. She exhibits no distension and no mass. There is no tenderness. There is no rebound and no guarding.  Musculoskeletal: Normal range of motion. She exhibits no edema or tenderness.  No calf asymmetry, superficial collaterals, palpable cords, edema, Homans sign negative bilaterally.     Neurological: She is alert and oriented to person, place, and time.  Skin: Skin is warm and dry. She is not diaphoretic.  Psychiatric: She has a normal mood and affect. Her behavior is normal.  Nursing note and vitals reviewed.   ED Course  Procedures (including critical care time)  DIAGNOSTIC STUDIES: Oxygen Saturation is 100% on RA, normal by my interpretation.    COORDINATION OF CARE: 2:58 PM Discussed treatment plan with pt at bedside and pt agreed to plan.  Labs Review Labs Reviewed  RAPID STREP SCREEN (NOT AT [facility])  CULTURE, GROUP A STREP [facility])    Imaging Review Dg Chest 2 View  08/21/2015  CLINICAL DATA:  Chest pain, productive cough. EXAM: CHEST  2 VIEW COMPARISON:  October 02, 2014. FINDINGS: The heart size and mediastinal contours are within normal limits. Both lungs are clear. No pneumothorax  or pleural effusion is noted. The visualized skeletal structures are unremarkable. IMPRESSION: No active cardiopulmonary disease. Electronically Signed   By: Marijo Conception, M.D.   On: 08/21/2015 15:16   I have personally reviewed and evaluated these images and lab results as part of my medical decision-making.   EKG Interpretation None      MDM   Final diagnoses:  Acute bronchitis, unspecified organism    Filed Vitals:   08/21/15 1350 08/21/15 1542  BP: 116/56   Pulse: 87 88  Temp: 97.9 F (36.6 C)   TempSrc: Oral   Resp: 18   SpO2: 100% 100%    Medications  ibuprofen (ADVIL,MOTRIN) tablet 400 mg (400 mg Oral Given 08/21/15 1540)  albuterol (PROVENTIL HFA;VENTOLIN HFA) 108 (90 Base) MCG/ACT inhaler 2 puff (2 puffs Inhalation Given 08/21/15 1541)    [name] is 35 y.o. female presenting with productive cough, sore throat and pleuritic chest pain. Lung sounds clear to auscultation, patient is afebrile with normal vital signs. I doubt this is a PE, patient is low risk by Wells criteria and PERC negative. Chest x-rays without infiltrate, patient will be  treated for acute bronchitis with prednisone and recommend Motrin for chest wall pain.  Evaluation does not show pathology that would require ongoing emergent intervention or inpatient treatment. Pt is hemodynamically stable and mentating appropriately. Discussed findings and plan with patient/guardian, who agrees with care plan. All questions answered. Return precautions discussed and outpatient follow up given.   Discharge Medication List as of 08/21/2015  3:28 PM    START taking these medications   Details  predniSONE (DELTASONE) 50 MG tablet Take 1 tablet daily with breakfast, Print         I personally performed the services described in this documentation, which was scribed in my presence. The recorded information has been reviewed and is accurate.   Monico Blitz, PA-C 08/21/15 [place], MD 08/22/15 [phone]

## 2015-08-21 NOTE — Discharge Instructions (Signed)
For pain control please take ibuprofen (also known as Motrin or Advil) 800mg  (this is normally 4 over the counter pills) 3 times a day  for 5 days. Take with food to minimize stomach irritation.  Please follow with your primary care doctor in the next 2 days for a check-up. They must obtain records for further management.   Do not hesitate to return to the Emergency Department for any new, worsening or concerning symptoms.    Acute Bronchitis Bronchitis is inflammation of the airways that extend from the windpipe into the lungs (bronchi). The inflammation often causes mucus to develop. This leads to a cough, which is the most common symptom of bronchitis.  In acute bronchitis, the condition usually develops suddenly and goes away over time, usually in a couple weeks. Smoking, allergies, and asthma can make bronchitis worse. Repeated episodes of bronchitis may cause further lung problems.  CAUSES Acute bronchitis is most often caused by the same virus that causes a cold. The virus can spread from person to person (contagious) through coughing, sneezing, and touching contaminated objects. SIGNS AND SYMPTOMS   Cough.   Fever.   Coughing up mucus.   Body aches.   Chest congestion.   Chills.   Shortness of breath.   Sore throat.  DIAGNOSIS  Acute bronchitis is usually diagnosed through a physical exam. Your health care provider will also ask you questions about your medical history. Tests, such as chest X-rays, are sometimes done to rule out other conditions.  TREATMENT  Acute bronchitis usually goes away in a couple weeks. Oftentimes, no medical treatment is necessary. Medicines are sometimes given for relief of fever or cough. Antibiotic medicines are usually not needed but may be prescribed in certain situations. In some cases, an inhaler may be recommended to help reduce shortness of breath and control the cough. A cool mist vaporizer may also be used to help thin bronchial  secretions and make it easier to clear the chest.  HOME CARE INSTRUCTIONS  Get plenty of rest.   Drink enough fluids to keep your urine clear or pale yellow (unless you have a medical condition that requires fluid restriction). Increasing fluids may help thin your respiratory secretions (sputum) and reduce chest congestion, and it will prevent dehydration.   Take medicines only as directed by your health care provider.  If you were prescribed an antibiotic medicine, finish it all even if you start to feel better.  Avoid smoking and secondhand smoke. Exposure to cigarette smoke or irritating chemicals will make bronchitis worse. If you are a smoker, consider using nicotine gum or skin patches to help control withdrawal symptoms. Quitting smoking will help your lungs heal faster.   Reduce the chances of another bout of acute bronchitis by washing your hands frequently, avoiding people with cold symptoms, and trying not to touch your hands to your mouth, nose, or eyes.   Keep all follow-up visits as directed by your health care provider.  SEEK MEDICAL CARE IF: Your symptoms do not improve after 1 week of treatment.  SEEK IMMEDIATE MEDICAL CARE IF:  You develop an increased fever or chills.   You have chest pain.   You have severe shortness of breath.  You have bloody sputum.   You develop dehydration.  You faint or repeatedly feel like you are going to pass out.  You develop repeated vomiting.  You develop a severe headache. MAKE SURE YOU:   Understand these instructions.  Will watch your condition.  Will get  help right away if you are not doing well or get worse.   This information is not intended to replace advice given to you by your health care provider. Make sure you discuss any questions you have with your health care provider.   Document Released: 05/18/2004 Document Revised: 05/01/2014 Document Reviewed: 10/01/2012 Elsevier Interactive Patient Education  [employer].

## 2015-08-23 LAB — CULTURE, GROUP A STREP (THRC)

## 2015-10-31 ENCOUNTER — Emergency Department (HOSPITAL_COMMUNITY)
Admission: EM | Admit: 2015-10-31 | Discharge: 2015-10-31 | Disposition: A | Discharge status: Home / Self Care | Payer: Medicaid Other | Attending: Emergency Medicine | Admitting: Emergency Medicine

## 2015-10-31 ENCOUNTER — Encounter (HOSPITAL_COMMUNITY): Payer: Self-pay | Admitting: *Deleted

## 2015-10-31 DIAGNOSIS — M549 Dorsalgia, unspecified: Secondary | ICD-10-CM | POA: Insufficient documentation

## 2015-10-31 DIAGNOSIS — F329 Major depressive disorder, single episode, unspecified: Secondary | ICD-10-CM | POA: Insufficient documentation

## 2015-10-31 DIAGNOSIS — J45909 Unspecified asthma, uncomplicated: Secondary | ICD-10-CM | POA: Insufficient documentation

## 2015-10-31 DIAGNOSIS — N390 Urinary tract infection, site not specified: Secondary | ICD-10-CM

## 2015-10-31 LAB — URINALYSIS, ROUTINE W REFLEX MICROSCOPIC
Bilirubin Urine: NEGATIVE
Glucose, UA: NEGATIVE mg/dL
Ketones, ur: NEGATIVE mg/dL
Nitrite: NEGATIVE
Protein, ur: NEGATIVE mg/dL
Specific Gravity, Urine: 1.011 (ref 1.005–1.030)
pH: 7.5 (ref 5.0–8.0)

## 2015-10-31 LAB — URINE MICROSCOPIC-ADD ON

## 2015-10-31 LAB — POC URINE PREG, ED: Preg Test, Ur: NEGATIVE

## 2015-10-31 MED ORDER — SULFAMETHOXAZOLE-TRIMETHOPRIM 800-160 MG PO TABS: 1.0000 | ORAL_TABLET | Freq: Two times a day (BID) | ORAL | Status: AC

## 2015-10-31 MED ORDER — IBUPROFEN 600 MG PO TABS: 600.0000 mg | ORAL_TABLET | Freq: Four times a day (QID) | ORAL | Status: DC | PRN

## 2015-10-31 NOTE — Discharge Instructions (Signed)

## 2015-10-31 NOTE — ED Provider Notes (Signed)
CSN: [id number]     Arrival date & time 10/31/15  0801 History   First MD Initiated Contact with Patient 10/31/15 0830     Chief Complaint  Patient presents with  . Back Pain  . Urinary Frequency     (Consider location/radiation/quality/duration/timing/severity/associated sxs/prior Treatment) HPI   35 year old female presenting with complaints of dysuria. Patient report for the past 4 days she has had lower abdominal and back pain, burning urination, urinary frequency and urgency. Also endorsed vaginal discharge but has not needed. Initially report having strong fishy odor on the first day but that has since resolved. She denies having fever, chills, nausea vomiting diarrhea, vaginal bleeding or rash. Denies any change in sexual partners. Denies any prior history of STD. Patient is not concern for potential STD. Report remote history of UTI and states that this felt similar. No history of kidney stone. No colicky pain. She has been drinking a lot of water and cranberry juice which has helped.  Past Medical History  Diagnosis Date  . Depression   . GERD (gastroesophageal reflux disease)   . Borderline personality disorder   . Anxiety   . Anginal pain ([place])     pt. takes nitrostat- as needed, hasn't had since 04/2012, sees Dr. Terrence Dupont, last ekg was /w PCP- Avbere  . Asthma   . Meningitis     Oct. 2013  . Headache(784.0)   . Neuromuscular disorder ([facility])     muscle spasms-back & arms   . Anemia     h/o  . Chiari malformation    Past Surgical History  Procedure Laterality Date  . Abdominal hysterectomy    . Peripherally inserted central catheter insertion      during event of Meningitis- then d/c'd to home /w & treated /w antibiotics   . Tubal ligation    . Wisdom tooth extraction    . Vaginal delivery      x3  . Suboccipital craniectomy cervical laminectomy N/A 07/22/2012    Procedure: SUBOCCIPITAL CRANIECTOMY CERVICAL LAMINECTOMY/DURAPLASTY;  Surgeon: Hosie Spangle, MD;   Location: [place] [facility];  Service: Neurosurgery;  Laterality: N/A;  Suboccipital craniectomy with upper cervial laminectomy with duroplasty    Family History  Problem Relation Age of Onset  . Heart attack Mother    Social History  Substance Use Topics  . Smoking status: Never Smoker   . Smokeless tobacco: None  . Alcohol Use: No   OB History    No data available     Review of Systems  All other systems reviewed and are negative.     Allergies  Amoxicillin; Imitrex; Penicillins; Norco; Percocet; Soy allergy; and Tramadol  Home Medications   Prior to Admission medications   Medication Sig Start Date End Date Taking? Authorizing Provider  adapalene (DIFFERIN) 0.1 % cream Apply 1 application topically at bedtime.    Historical Provider, MD  albuterol (PROVENTIL HFA;VENTOLIN HFA) 108 (90 BASE) MCG/ACT inhaler Inhale 2 puffs into the lungs every 6 (six) hours as needed for shortness of breath.     Historical Provider, MD  citalopram (CELEXA) 20 MG tablet Take 20 mg by mouth daily.    Historical Provider, MD  clindamycin (CLEOCIN) 300 MG capsule Take 1 capsule (300 mg total) by mouth 3 (three) times daily. Patient not taking: Reported on 10/02/2014 09/13/13   Billy Fischer, MD  cyclobenzaprine (FLEXERIL) 10 MG tablet Take 10 mg by mouth at bedtime as needed for muscle spasms.  07/28/14   Historical Provider,  MD  diclofenac (CATAFLAM) 50 MG tablet Take 1 tablet (50 mg total) by mouth 3 (three) times daily. For dental pain 09/13/13   Billy Fischer, MD  ibuprofen (ADVIL,MOTRIN) 200 MG tablet Take 800 mg by mouth every 8 (eight) hours as needed for pain.     Historical Provider, MD  ibuprofen (ADVIL,MOTRIN) 600 MG tablet Take 1 tablet (600 mg total) by mouth every 6 (six) hours as needed. 04/08/13   Harvie Heck, PA-C  lamoTRIgine (LAMICTAL) 150 MG tablet Take 150 mg by mouth 2 (two) times daily.    Historical Provider, MD  meclizine (ANTIVERT) 25 MG tablet Take 25 mg by mouth 3 (three) times  daily as needed for dizziness or nausea.    Historical Provider, MD  Olopatadine HCl (PATADAY) 0.2 % SOLN Place 1 drop into both eyes daily.    Historical Provider, MD  pantoprazole (PROTONIX) 20 MG tablet Take 20 mg by mouth daily.    Historical Provider, MD  predniSONE (DELTASONE) 50 MG tablet Take 1 tablet daily with breakfast 08/21/15   Elmyra Ricks Pisciotta, PA-C  promethazine (PHENERGAN) 12.5 MG tablet Take 12.5 mg by mouth every 6 (six) hours as needed for nausea or vomiting.    Historical Provider, MD   BP 114/69 mmHg  Pulse 82  Temp(Src) 98.3 F (36.8 C) (Oral)  Resp 18  Ht 5\' 5"  (1.651 m)  Wt 87.544 kg  BMI 32.12 kg/m2  SpO2 99% Physical Exam  Constitutional: She is oriented to person, place, and time. She appears well-developed and well-nourished. No distress.  African-American female laying bed in no acute discomfort, nontoxic  HENT:  Head: Atraumatic.  Eyes: Conjunctivae are normal.  Neck: Neck supple.  Cardiovascular: Normal rate and regular rhythm.   Pulmonary/Chest: Effort normal and breath sounds normal.  Abdominal: Soft. Bowel sounds are normal. There is tenderness (Tenderness to suprapubic region without guarding or rebound tenderness. Mild epigastric tenderness.).  Musculoskeletal: She exhibits tenderness (Tenderness to low back on percussion.).  Neurological: She is alert and oriented to person, place, and time.  Skin: No rash noted.  Psychiatric: She has a normal mood and affect.  Nursing note and vitals reviewed.   ED Course  Procedures (including critical care time) Labs Review Labs Reviewed  URINALYSIS, ROUTINE W REFLEX MICROSCOPIC (NOT AT [facility]) - Abnormal; Notable for the following:    APPearance CLOUDY (*)    Hgb urine dipstick TRACE (*)    Leukocytes, UA LARGE (*)    All other components within normal limits  URINE MICROSCOPIC-ADD ON - Abnormal; Notable for the following:    Squamous Epithelial / LPF 0-5 (*)    Bacteria, UA FEW (*)    All other  components within normal limits  URINE CULTURE  POC URINE PREG, ED    Imaging Review No results found. I have personally reviewed and evaluated these images and lab results as part of my medical decision-making.   EKG Interpretation None      MDM   Final diagnoses:  UTI (lower urinary tract infection)    BP 114/69 mmHg  Pulse 82  Temp(Src) 98.3 F (36.8 C) (Oral)  Resp 18  Ht 5\' 5"  (1.651 m)  Wt 87.544 kg  BMI 32.12 kg/m2  SpO2 99%   8:45 AM Patient presents with dysuria concerning for urinary tract infection. No fever, nausea vomiting concerning for pyelonephritis at this time. Patient states she is not concerned for STD. Plan to check UA, if positive for urinary tract infection, which would  with antibiotic but if urine is not consistent with a urinary tract infection, then will consider performing pelvic examination for the evaluation. Otherwise patient has a nonsurgical abdomen, I have low suspicion for acute abdominal pathology.  10:13 AM UA results is consistence with a urinary tract infection. Her pregnancy test is negative. Patient will be treated with Bactrim. Return precaution discussed. Otherwise patient stable to follow-up with PCP for further care.  Domenic Moras, PA-C 10/31/15 [place], MD 10/31/15 1540

## 2015-10-31 NOTE — ED Notes (Signed)
Per pt report: pt presents with lower back pain, pelvic pain, burning upon urination and urinary frequency for the past 4 days.  Pt reports some vaginal discharge that has improved. Pt a/o x 4 and ambulatory.

## 2015-11-02 LAB — URINE CULTURE: Culture: >=100000 — AB

## 2015-11-03 ENCOUNTER — Telehealth: Payer: Self-pay | Admitting: *Deleted

## 2015-11-03 NOTE — Telephone Encounter (Signed)
Post ED Visit - Positive Culture Follow-up  Culture report reviewed by antimicrobial stewardship pharmacist:  []  Elenor Quinones, Pharm.D. []  Heide Guile, Pharm.D., BCPS []  Parks Neptune, Pharm.D. [x]  Alycia Rossetti, Pharm.D., BCPS []  Festus, Pharm.D., BCPS, AAHIVP []  Legrand Como, Pharm.D., BCPS, AAHIVP []  Milus Glazier, Pharm.D. []  Rob Evette Doffing, Pharm.D.  Positive urine culture Treated with Sulfa,etjpxazp;e-Trimethoprim, organism sensitive to the same and no further patient follow-up is required at this time.  Harlon Flor [facility] 11/03/2015, 10:01 AM

## 2015-12-18 ENCOUNTER — Emergency Department (HOSPITAL_COMMUNITY)
Admission: EM | Admit: 2015-12-18 | Discharge: 2015-12-18 | Disposition: A | Discharge status: Home / Self Care | Payer: No Typology Code available for payment source | Attending: Emergency Medicine | Admitting: Emergency Medicine

## 2015-12-18 ENCOUNTER — Emergency Department (HOSPITAL_COMMUNITY): Payer: No Typology Code available for payment source

## 2015-12-18 ENCOUNTER — Encounter (HOSPITAL_COMMUNITY): Payer: Self-pay | Admitting: Emergency Medicine

## 2015-12-18 DIAGNOSIS — R51 Headache: Secondary | ICD-10-CM | POA: Insufficient documentation

## 2015-12-18 DIAGNOSIS — Y999 Unspecified external cause status: Secondary | ICD-10-CM | POA: Diagnosis not present

## 2015-12-18 DIAGNOSIS — M436 Torticollis: Secondary | ICD-10-CM | POA: Diagnosis not present

## 2015-12-18 DIAGNOSIS — Y939 Activity, unspecified: Secondary | ICD-10-CM | POA: Insufficient documentation

## 2015-12-18 DIAGNOSIS — Z79899 Other long term (current) drug therapy: Secondary | ICD-10-CM | POA: Diagnosis not present

## 2015-12-18 DIAGNOSIS — R0789 Other chest pain: Secondary | ICD-10-CM | POA: Diagnosis present

## 2015-12-18 DIAGNOSIS — J45909 Unspecified asthma, uncomplicated: Secondary | ICD-10-CM | POA: Insufficient documentation

## 2015-12-18 DIAGNOSIS — Y92410 Unspecified street and highway as the place of occurrence of the external cause: Secondary | ICD-10-CM | POA: Insufficient documentation

## 2015-12-18 DIAGNOSIS — M542 Cervicalgia: Secondary | ICD-10-CM

## 2015-12-18 DIAGNOSIS — R519 Headache, unspecified: Secondary | ICD-10-CM

## 2015-12-18 MED ORDER — CYCLOBENZAPRINE HCL 5 MG PO TABS: 5.0000 mg | ORAL_TABLET | Freq: Three times a day (TID) | ORAL | 0 refills | Status: DC | PRN
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
## 2015-12-18 NOTE — Discharge Instructions (Signed)
Read the information below.   Your x-ray and imaging does not show any obvious acute abnormality. You may have muscle soreness for the next 2-3 days. You can take tylenol 650mg  every 6hrs or motrin 400mg  every 6hrs for pain relief. I have prescribed a muscle relaxer for muscle pain. This medication can make you drowsy, do not drive after taking. You can apply ice to affected areas or try warm showers for relief of sore muscles.  Use the prescribed medication as directed.  Please discuss all new medications with your pharmacist.   Follow up with your primary provider next week if your symptoms persist.  You may return to the Emergency Department at any time for worsening condition or any new symptoms that concern you. Return to ED if you develop fever, weakness/numbness, loss of consciousness, facial droop/slurred speech, changes in vision, worsening headache uncontrolled at home.
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
## 2015-12-20 ENCOUNTER — Emergency Department (HOSPITAL_COMMUNITY): Payer: Medicaid Other

## 2015-12-20 ENCOUNTER — Encounter (HOSPITAL_COMMUNITY): Payer: Self-pay

## 2015-12-20 ENCOUNTER — Emergency Department (HOSPITAL_COMMUNITY)
Admission: EM | Admit: 2015-12-20 | Discharge: 2015-12-20 | Disposition: A | Discharge status: Home / Self Care | Payer: Medicaid Other | Attending: Emergency Medicine | Admitting: Emergency Medicine

## 2015-12-20 DIAGNOSIS — M546 Pain in thoracic spine: Secondary | ICD-10-CM

## 2015-12-20 DIAGNOSIS — Y939 Activity, unspecified: Secondary | ICD-10-CM | POA: Insufficient documentation

## 2015-12-20 DIAGNOSIS — Y92410 Unspecified street and highway as the place of occurrence of the external cause: Secondary | ICD-10-CM | POA: Insufficient documentation

## 2015-12-20 DIAGNOSIS — J45909 Unspecified asthma, uncomplicated: Secondary | ICD-10-CM | POA: Insufficient documentation

## 2015-12-20 DIAGNOSIS — R519 Headache, unspecified: Secondary | ICD-10-CM

## 2015-12-20 DIAGNOSIS — R52 Pain, unspecified: Secondary | ICD-10-CM

## 2015-12-20 DIAGNOSIS — R51 Headache: Secondary | ICD-10-CM | POA: Insufficient documentation

## 2015-12-20 DIAGNOSIS — Z79899 Other long term (current) drug therapy: Secondary | ICD-10-CM | POA: Insufficient documentation

## 2015-12-20 DIAGNOSIS — M545 Low back pain, unspecified: Secondary | ICD-10-CM

## 2015-12-20 DIAGNOSIS — Y999 Unspecified external cause status: Secondary | ICD-10-CM | POA: Insufficient documentation

## 2015-12-20 MED ORDER — DIPHENHYDRAMINE HCL 50 MG/ML IJ SOLN
25.0000 mg | Freq: Once | INTRAMUSCULAR | Status: AC
  Administered 2015-12-20: 25 mg via INTRAVENOUS
  Filled 2015-12-20: qty 1

## 2015-12-20 MED ORDER — KETOROLAC TROMETHAMINE 30 MG/ML IJ SOLN
30.0000 mg | Freq: Once | INTRAMUSCULAR | Status: AC
  Administered 2015-12-20: 30 mg via INTRAVENOUS
  Filled 2015-12-20: qty 1

## 2015-12-20 MED ORDER — SODIUM CHLORIDE 0.9 % IV BOLUS (SEPSIS)
1000.0000 mL | Freq: Once | INTRAVENOUS | Status: AC
Start: 2015-12-20 — End: 2015-12-20
  Administered 2015-12-20: 1000 mL via INTRAVENOUS

## 2015-12-20 MED ORDER — METOCLOPRAMIDE HCL 5 MG/ML IJ SOLN
10.0000 mg | Freq: Once | INTRAMUSCULAR | Status: AC
  Administered 2015-12-20: 10 mg via INTRAVENOUS
  Filled 2015-12-20: qty 2

## 2015-12-20 MED ORDER — NAPROXEN 250 MG PO TABS: 250.0000 mg | ORAL_TABLET | Freq: Two times a day (BID) | ORAL | 0 refills | Status: DC
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
## 2015-12-20 NOTE — ED Notes (Signed)
Patient transported to X-ray 
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
## 2015-12-20 NOTE — ED Notes (Signed)
Bed: WA20 Expected date:  Expected time:  Means of arrival:  Comments: 

## 2016-05-27 ENCOUNTER — Emergency Department (HOSPITAL_COMMUNITY): Payer: Medicaid Other

## 2016-05-27 ENCOUNTER — Emergency Department (HOSPITAL_COMMUNITY)
Admission: EM | Admit: 2016-05-27 | Discharge: 2016-05-27 | Disposition: A | Discharge status: Home / Self Care | Payer: Medicaid Other | Attending: Emergency Medicine | Admitting: Emergency Medicine

## 2016-05-27 ENCOUNTER — Encounter (HOSPITAL_COMMUNITY): Payer: Self-pay | Admitting: Emergency Medicine

## 2016-05-27 DIAGNOSIS — S91201A Unspecified open wound of right great toe with damage to nail, initial encounter: Secondary | ICD-10-CM | POA: Diagnosis not present

## 2016-05-27 DIAGNOSIS — Y999 Unspecified external cause status: Secondary | ICD-10-CM | POA: Diagnosis not present

## 2016-05-27 DIAGNOSIS — W228XXA Striking against or struck by other objects, initial encounter: Secondary | ICD-10-CM | POA: Insufficient documentation

## 2016-05-27 DIAGNOSIS — J45909 Unspecified asthma, uncomplicated: Secondary | ICD-10-CM | POA: Insufficient documentation

## 2016-05-27 DIAGNOSIS — S91209A Unspecified open wound of unspecified toe(s) with damage to nail, initial encounter: Secondary | ICD-10-CM

## 2016-05-27 DIAGNOSIS — Y929 Unspecified place or not applicable: Secondary | ICD-10-CM | POA: Insufficient documentation

## 2016-05-27 DIAGNOSIS — Z79899 Other long term (current) drug therapy: Secondary | ICD-10-CM | POA: Diagnosis not present

## 2016-05-27 DIAGNOSIS — S99921A Unspecified injury of right foot, initial encounter: Secondary | ICD-10-CM | POA: Diagnosis present

## 2016-05-27 DIAGNOSIS — Y939 Activity, unspecified: Secondary | ICD-10-CM | POA: Diagnosis not present

## 2016-05-27 NOTE — ED Provider Notes (Signed)
[facility] DEPT Provider Note   CSN: [id number] Arrival date & time: 05/27/16  1057  By signing my name below, I, Hansel Feinstein, attest that this documentation has been prepared under the direction and in the presence of Malvin Johns, MD. Electronically Signed: Hansel Feinstein, ED Scribe. 05/27/16. 11:13 AM.     History   Chief Complaint Chief Complaint  Patient presents with  . Toe Pain  . Toe Injury    HPI [name] is a 37 y.o. female who presents to the Emergency Department complaining of moderate right great toe pain s/p injury that occurred yesterday. Pt states that she kicked the vacuum cleaner and struck the toe directly, causing her injury. She also reports the toenail was avulsed after kicking the vacuum so she pulled it completely off. She states her pain is worsened with weight bearing and ambulation. Pt notes she has taken ibuprofen with temporary relief of pain. Tdap UTD. She denies numbness, additional injuries or complaints.   The history is provided by the patient. No language interpreter was used.    Past Medical History:  Diagnosis Date  . Anemia    h/o  . Anginal pain ([place])    pt. takes nitrostat- as needed, hasn't had since 04/2012, sees Dr. Terrence Dupont, last ekg was /w PCP- Avbere  . Anxiety   . Asthma   . Borderline personality disorder   . Chiari malformation   . Depression   . GERD (gastroesophageal reflux disease)   . Headache(784.0)   . Meningitis    Oct. 2013  . Neuromuscular disorder ([place])    muscle spasms-back & arms     Patient Active Problem List   Diagnosis Date Noted  . SVT (supraventricular tachycardia) ([place]) 01/22/2012  . E coli bacteremia 01/22/2012  . Sepsis(995.91) 01/20/2012  . UTI (lower urinary tract infection) 01/20/2012  . Arnold-Chiari malformation, type I ([place]) 01/20/2012  . Headache(784.0) 01/19/2012    Past Surgical History:  Procedure Laterality Date  . ABDOMINAL HYSTERECTOMY    . PERIPHERALLY INSERTED CENTRAL  CATHETER INSERTION     during event of Meningitis- then d/c'd to home /w & treated /w antibiotics   . SUBOCCIPITAL CRANIECTOMY CERVICAL LAMINECTOMY N/A 07/22/2012   Procedure: SUBOCCIPITAL CRANIECTOMY CERVICAL LAMINECTOMY/DURAPLASTY;  Surgeon: Hosie Spangle, MD;  Location: [facility];  Service: Neurosurgery;  Laterality: N/A;  Suboccipital craniectomy with upper cervial laminectomy with duroplasty   . TUBAL LIGATION    . VAGINAL DELIVERY     x3  . WISDOM TOOTH EXTRACTION      OB History    No data available       Home Medications    Prior to Admission medications   Medication Sig Start Date End Date Taking? Authorizing Provider  albuterol (PROVENTIL HFA;VENTOLIN HFA) 108 (90 BASE) MCG/ACT inhaler Inhale 2 puffs into the lungs every 6 (six) hours as needed for shortness of breath.     Historical Provider, MD  BIOTIN PO Take 1 tablet by mouth daily.    Historical Provider, MD  CALCIUM-MAG-VIT C-VIT D PO Take 2 tablets by mouth daily.    Historical Provider, MD  citalopram (CELEXA) 10 MG tablet Take 10 mg by mouth daily with breakfast.    Historical Provider, MD  meclizine (ANTIVERT) 25 MG tablet Take 25 mg by mouth 3 (three) times daily as needed for dizziness or nausea.    Historical Provider, MD  naproxen (NAPROSYN) 250 MG tablet Take 1 tablet (250 mg total) by mouth 2 (two) times  daily with a meal. 12/20/15   Waynetta Pean, PA-C  Olopatadine HCl (PATADAY) 0.2 % SOLN Place 1 drop into both eyes daily as needed (for alleriges).     Historical Provider, MD  Phenazopyridine HCl (AZO TABS PO) Take 2 tablets by mouth daily.    Historical Provider, MD  promethazine (PHENERGAN) 12.5 MG tablet Take 12.5 mg by mouth every 6 (six) hours as needed for nausea or vomiting.    Historical Provider, MD    Family History Family History  Problem Relation Age of Onset  . Heart attack Mother     Social History Social History  Substance Use Topics  . Smoking status: Never Smoker  . Smokeless  tobacco: Never Used  . Alcohol use No     Allergies   Amoxicillin; Imitrex [sumatriptan base]; Penicillins; Tramadol; Norco [hydrocodone-acetaminophen]; Percocet [oxycodone-acetaminophen]; and Soy allergy   Review of Systems Review of Systems  Constitutional: Negative for fever.  Gastrointestinal: Negative for nausea and vomiting.  Musculoskeletal: Positive for arthralgias. Negative for back pain and neck pain.  Skin: Positive for wound.  Neurological: Negative for weakness, numbness and headaches.     Physical Exam Updated Vital Signs BP 137/74 (BP Location: Left Arm)   Pulse 69   Temp 97.9 F (36.6 C) (Oral)   Resp 16   Ht 5\' 5"  (1.651 m)   Wt 194 lb (88 kg)   SpO2 99%   BMI 32.28 kg/m   Physical Exam  Constitutional: She is oriented to person, place, and time. She appears well-developed and well-nourished.  HENT:  Head: Normocephalic and atraumatic.  Neck: Normal range of motion. Neck supple.  Cardiovascular: Normal rate.   Pulmonary/Chest: Effort normal.  Musculoskeletal: She exhibits tenderness.  Absence of the nail to the right big toe. There is tenderness over the distal phalanx. No other bony tenderness of the foot. Normal movement and sensation of the toe. Cap refill <2 seconds.   Neurological: She is alert and oriented to person, place, and time.  Skin: Skin is warm and dry.  Psychiatric: She has a normal mood and affect.  Nursing note and vitals reviewed.    ED Treatments / Results   DIAGNOSTIC STUDIES: Oxygen Saturation is 99% on RA, normal by my interpretation.    COORDINATION OF CARE: 11:11 AM Discussed treatment plan with pt at bedside which includes XR and pt agreed to plan.    Labs (all labs ordered are listed, but only abnormal results are displayed) Labs Reviewed - No data to display  EKG  EKG Interpretation None       Radiology Dg Toe Great Right  Result Date: 05/27/2016 CLINICAL DATA:  Great toe pain following injury  yesterday. Initial encounter. EXAM: RIGHT GREAT TOE COMPARISON:  None. FINDINGS: The mineralization and alignment are normal. There is no evidence of acute fracture or dislocation. The joint spaces are maintained. There is mild spurring of the distal first phalanx. IMPRESSION: No acute osseous findings demonstrated. Electronically Signed   By: Richardean Sale M.D.   On: 05/27/2016 11:54    Procedures Procedures (including critical care time)  Medications Ordered in ED Medications - No data to display   Initial Impression / Assessment and Plan / ED Course  I have reviewed the triage vital signs and the nursing notes.  Pertinent labs & imaging results that were available during my care of the patient were reviewed by me and considered in my medical decision making (see chart for details).     Patient X-Ray  negative for obvious fracture or dislocation.  Pt advised to follow up with orthopedics. Patient given post op shoe while in ED, conservative therapy and wound care recommended and discussed. Patient will be discharged home & is agreeable with above plan. Returns precautions discussed. Pt appears safe for discharge.   Final Clinical Impressions(s) / ED Diagnoses   Final diagnoses:  Avulsion of toenail, initial encounter    New Prescriptions New Prescriptions   No medications on file    I personally performed the services described in this documentation, which was scribed in my presence.  The recorded information has been reviewed and considered.    Malvin Johns, MD 05/27/16 [phone]

## 2016-05-27 NOTE — ED Triage Notes (Signed)
Pt complaint of right great toe pain post kicking vacuum cleaner Friday.

## 2016-09-10 ENCOUNTER — Encounter (HOSPITAL_COMMUNITY): Payer: Self-pay | Admitting: Emergency Medicine

## 2016-09-10 ENCOUNTER — Emergency Department (HOSPITAL_COMMUNITY)
Admission: EM | Admit: 2016-09-10 | Discharge: 2016-09-10 | Disposition: A | Discharge status: Home / Self Care | Payer: Medicaid Other | Attending: Emergency Medicine | Admitting: Emergency Medicine

## 2016-09-10 DIAGNOSIS — J45909 Unspecified asthma, uncomplicated: Secondary | ICD-10-CM | POA: Insufficient documentation

## 2016-09-10 DIAGNOSIS — H5789 Other specified disorders of eye and adnexa: Secondary | ICD-10-CM

## 2016-09-10 DIAGNOSIS — H11153 Pinguecula, bilateral: Secondary | ICD-10-CM

## 2016-09-10 DIAGNOSIS — H578 Other specified disorders of eye and adnexa: Secondary | ICD-10-CM | POA: Diagnosis present

## 2016-09-10 MED ORDER — TETRACAINE HCL 0.5 % OP SOLN
2.0000 [drp] | Freq: Once | OPHTHALMIC | Status: AC
  Administered 2016-09-10: 2 [drp] via OPHTHALMIC
  Filled 2016-09-10: qty 2

## 2016-09-10 MED ORDER — FLUORESCEIN SODIUM 0.6 MG OP STRP
2.0000 | ORAL_STRIP | Freq: Once | OPHTHALMIC | Status: AC
  Administered 2016-09-10: 2 via OPHTHALMIC
  Filled 2016-09-10: qty 2

## 2016-09-10 NOTE — ED Triage Notes (Signed)
Pt. Stated, I've had a headache for a week and I have a bubble and yellow spots in y eye for about a month.

## 2016-09-10 NOTE — Discharge Instructions (Signed)
Please read and follow all provided instructions.  Your diagnoses today include:  1. Pinguecula of both eyes   2. Irritation of both eyes     Tests performed today include:  Visual acuity testing to check your vision  Fluorescein dye examination to look for scratches on your eye  Tonometry to check the pressure inside of your eye  Vital signs. See below for your results today.   Medications prescribed:   None  Take any prescribed medications only as directed.  Home care instructions:  Follow any educational materials contained in this packet. If you wear contact lenses, do not use them until your eye caregiver approves. Follow-up care is necessary to be sure the infection is healing if not completely resolved in 2-3 days. See your caregiver or eye specialist as suggested for followup.   Follow-up instructions: Please follow-up with the opthalmologist listed in the next 2-3 days for further evaluation of your symptoms.  Return instructions:   Please return to the Emergency Department if you experience worsening symptoms.   Please return immediately if you develop severe pain, pus drainage, new change in vision, or fever.  Please return if you have any other emergent concerns.  Additional Information:  Your vital signs today were: BP 123/71 (BP Location: Left Arm)    Pulse 82    Temp 98.8 F (37.1 C) (Oral)    Resp 17    Ht 5\' 5"  (1.651 m)    Wt 186 lb (84.4 kg)    SpO2 98%    BMI 30.95 kg/m  If your blood pressure (BP) was elevated above 135/85 this visit, please have this repeated by your doctor within one month. ---------------

## 2016-09-10 NOTE — ED Provider Notes (Signed)
[facility] DEPT Provider Note   CSN: [id number] Arrival date & time: 09/10/16  1024     History   Chief Complaint Chief Complaint  Patient presents with  . Eye Problem  . Headache    HPI [name] is a 36 y.o. female.  Patient presents with complaint of burning irritated eyes, worse with light, over the past one month. Patient has noted yellow bumps on her sclera. No injury to the eyes. Patient does not wear contacts lenses. She has had some blurry vision but no spots, flashes, or loss of vision. Symptoms are causing her to have a headache. She saw her doctor and was referred to an ophthalmologist but does not have appointment until August. No vomiting. The onset of this condition was acute. The course is constant. Aggravating factors: none. Alleviating factors: none.        Past Medical History:  Diagnosis Date  . Anemia    h/o  . Anginal pain ([place])    pt. takes nitrostat- as needed, hasn't had since 04/2012, sees Dr. Terrence Dupont, last ekg was /w PCP- [facility]  . Anxiety   . Asthma   . Borderline personality disorder   . Chiari malformation   . Depression   . GERD (gastroesophageal reflux disease)   . Headache(784.0)   . Meningitis    Oct. 2013  . Neuromuscular disorder ([place])    muscle spasms-back & arms     Patient Active Problem List   Diagnosis Date Noted  . SVT (supraventricular tachycardia) ([place]) 01/22/2012  . E coli bacteremia 01/22/2012  . Sepsis(995.91) 01/20/2012  . UTI (lower urinary tract infection) 01/20/2012  . Arnold-Chiari malformation, type I ([place]) 01/20/2012  . Headache(784.0) 01/19/2012    Past Surgical History:  Procedure Laterality Date  . ABDOMINAL HYSTERECTOMY    . PERIPHERALLY INSERTED CENTRAL CATHETER INSERTION     during event of Meningitis- then d/c'd to home /w & treated /w antibiotics   . SUBOCCIPITAL CRANIECTOMY CERVICAL LAMINECTOMY N/A 07/22/2012   Procedure: SUBOCCIPITAL CRANIECTOMY CERVICAL LAMINECTOMY/DURAPLASTY;   Surgeon: Hosie Spangle, MD;  Location: [facility];  Service: Neurosurgery;  Laterality: N/A;  Suboccipital craniectomy with upper cervial laminectomy with duroplasty   . TUBAL LIGATION    . VAGINAL DELIVERY     x3  . WISDOM TOOTH EXTRACTION      OB History    No data available       Home Medications    Prior to Admission medications   Medication Sig Start Date End Date Taking? Authorizing Provider  albuterol (PROVENTIL HFA;VENTOLIN HFA) 108 (90 BASE) MCG/ACT inhaler Inhale 2 puffs into the lungs every 6 (six) hours as needed for shortness of breath.     [provider]  BIOTIN PO Take 1 tablet by mouth daily.    [provider]  CALCIUM-MAG-VIT C-VIT D PO Take 2 tablets by mouth daily.    [provider]  citalopram (CELEXA) 10 MG tablet Take 10 mg by mouth daily with breakfast.    [provider]  meclizine (ANTIVERT) 25 MG tablet Take 25 mg by mouth 3 (three) times daily as needed for dizziness or nausea.    [provider]  naproxen (NAPROSYN) 250 MG tablet Take 1 tablet (250 mg total) by mouth 2 (two) times daily with a meal. 12/20/15   Waynetta Pean, PA-C  Olopatadine HCl (PATADAY) 0.2 % SOLN Place 1 drop into both eyes daily as needed (for alleriges).     [provider]  Phenazopyridine HCl (AZO TABS PO) Take 2 tablets by mouth daily.    [provider]  promethazine (PHENERGAN) 12.5 MG tablet Take 12.5 mg by mouth every 6 (six) hours as needed for nausea or vomiting.    [provider]    Family History Family History  Problem Relation Age of Onset  . Heart attack Mother     Social History Social History  Substance Use Topics  . Smoking status: Never Smoker  . Smokeless tobacco: Never Used  . Alcohol use No     Allergies   Amoxicillin; Imitrex [sumatriptan base]; Penicillins; Tramadol; Norco [hydrocodone-acetaminophen]; Percocet [oxycodone-acetaminophen]; and Soy allergy   Review of  Systems Review of Systems  Constitutional: Negative for fever.  HENT: Negative for congestion, ear pain, rhinorrhea, sinus pressure and sore throat.   Eyes: Positive for photophobia. Negative for pain, redness and visual disturbance.  Gastrointestinal: Negative for nausea and vomiting.  Skin: Negative for rash.  Neurological: Positive for headaches.  Hematological: Negative for adenopathy.     Physical Exam Updated Vital Signs BP 123/71 (BP Location: Left Arm)   Pulse 82   Temp 98.8 F (37.1 C) (Oral)   Resp 17   Ht 5\' 5"  (1.651 m)   Wt 186 lb (84.4 kg)   SpO2 98%   BMI 30.95 kg/m   Physical Exam  Constitutional: She appears well-developed and well-nourished.  HENT:  Head: Normocephalic and atraumatic.  Eyes: EOM and lids are normal. Pupils are equal, round, and reactive to light. Right eye exhibits no chemosis, no discharge and no exudate. Left eye exhibits no chemosis, no discharge and no exudate. Right conjunctiva is not injected. Left conjunctiva is not injected.  Fundoscopic exam:      The right eye shows no exudate and no hemorrhage.       The left eye shows no exudate and no hemorrhage.  Slit lamp exam:      The right eye shows no corneal abrasion, no fluorescein uptake and no anterior chamber bulge.       The left eye shows no corneal abrasion, no fluorescein uptake and no anterior chamber bulge.  No gross abnormalities on funduscopic examination bilaterally. Patient with small yellow/white nodules near the limbus consistent with pinguecula.  Neck: Normal range of motion. Neck supple.  Pulmonary/Chest: No respiratory distress.  Neurological: She is alert.  Skin: Skin is warm and dry.  Psychiatric: She has a normal mood and affect.  Nursing note and vitals reviewed.    ED Treatments / Results   Procedures Procedures (including critical care time)  Medications Ordered in ED Medications  fluorescein ophthalmic strip 2 strip (not administered)  tetracaine  (PONTOCAINE) 0.5 % ophthalmic solution 2 drop (not administered)     Initial Impression / Assessment and Plan / ED Course  I have reviewed the triage vital signs and the nursing notes.  Pertinent labs & imaging results that were available during my care of the patient were reviewed by me and considered in my medical decision making (see chart for details).     Patient seen and examined. Work-up initiated. Medications ordered.   Vital signs reviewed and are as follows: BP 110/68   Pulse 63   Temp 98.8 F (37.1 C) (Oral)   Resp 17   Ht 5\' 5"  (1.651 m)   Wt 186 lb (84.4 kg)   SpO2 98%   BMI 30.95 kg/m   Two drops of tetracaine instilled into both eyes.   Fluorescein strip applied to  affected eye. Wood's lamp used to assess for corneal abrasion. No corneal abrasion identified. No foreign bodies noted. No visible hyphema.   Tonometry performed. Right eye pressure: 14 Left eye pressure: 15  Patient tolerated procedure well without immediate complication.   Patient counseled. Encouraged ophthalmology follow-up this week for examine recheck. Encouraged return to the emergency department with worsening symptoms or other concerns.    Visual Acuity  Right Eye Distance:   Left Eye Distance:   Bilateral Distance:    Right Eye Near: R Near: 20/20 Left Eye Near:  L Near: 20/20 Bilateral Near:  20/20;   Final Clinical Impressions(s) / ED Diagnoses   Final diagnoses:  Pinguecula of both eyes  Irritation of both eyes   Patient with eye irritation. Benign-appearing pinguecula. Pressures normal. Funduscopic exam grossly normal. No corneal abrasion noted. No ophthalmologic emergency suspected. Follow-up as above.  New Prescriptions New Prescriptions   No medications on file     Carlisle Cater, Hershal Coria 09/10/16 1245    Daleen Bo, MD 09/10/16 1454

## 2016-12-15 ENCOUNTER — Ambulatory Visit (HOSPITAL_COMMUNITY)
Admission: EM | Admit: 2016-12-15 | Discharge: 2016-12-15 | Disposition: A | Discharge status: Home / Self Care | Payer: Medicaid Other | Attending: Family | Admitting: Family

## 2016-12-15 ENCOUNTER — Encounter (HOSPITAL_COMMUNITY): Payer: Self-pay | Admitting: *Deleted

## 2016-12-15 DIAGNOSIS — C319 Malignant neoplasm of accessory sinus, unspecified: Secondary | ICD-10-CM

## 2016-12-15 DIAGNOSIS — J0100 Acute maxillary sinusitis, unspecified: Secondary | ICD-10-CM

## 2016-12-15 DIAGNOSIS — R599 Enlarged lymph nodes, unspecified: Secondary | ICD-10-CM

## 2016-12-15 MED ORDER — SULFAMETHOXAZOLE-TRIMETHOPRIM 800-160 MG PO TABS: 1.0000 | ORAL_TABLET | Freq: Two times a day (BID) | ORAL | 0 refills | Status: AC

## 2016-12-15 NOTE — Discharge Instructions (Signed)
See your Physician for recheck if symptoms persist past one week.

## 2016-12-15 NOTE — ED Provider Notes (Addendum)
[facility]    CSN: [id number] Arrival date & time: 12/15/16  1551     History   Chief Complaint Chief Complaint  Patient presents with  . Nasal Congestion    HPI [name] is a 36 y.o. female.   The history is provided by the patient. No language interpreter was used.  Cough  Cough characteristics:  Productive Sputum characteristics:  Nondescript Severity:  Moderate Onset quality:  Gradual Timing:  Constant Progression:  Worsening Chronicity:  New Smoker: no   Context: not smoke exposure   Relieved by:  Nothing Worsened by:  Nothing Ineffective treatments:  None tried Associated symptoms: sinus congestion   Associated symptoms: no sore throat   Risk factors: no recent infection   Pt complains of swollen glands in her neck.  Pt reports she has sinus pain pressure and drainage.  Past Medical History:  Diagnosis Date  . Anemia    h/o  . Anginal pain ([place])    pt. takes nitrostat- as needed, hasn't had since 04/2012, sees Dr. Terrence Dupont, last ekg was /w PCP- [facility]  . Anxiety   . Asthma   . Borderline personality disorder   . Chiari malformation   . Depression   . GERD (gastroesophageal reflux disease)   . Headache(784.0)   . Meningitis    Oct. 2013  . Neuromuscular disorder ([place])    muscle spasms-back & arms     Patient Active Problem List   Diagnosis Date Noted  . SVT (supraventricular tachycardia) ([place]) 01/22/2012  . E coli bacteremia 01/22/2012  . Sepsis(995.91) 01/20/2012  . UTI (lower urinary tract infection) 01/20/2012  . Arnold-Chiari malformation, type I ([place]) 01/20/2012  . Headache(784.0) 01/19/2012    Past Surgical History:  Procedure Laterality Date  . ABDOMINAL HYSTERECTOMY    . PERIPHERALLY INSERTED CENTRAL CATHETER INSERTION     during event of Meningitis- then d/c'd to home /w & treated /w antibiotics   . SUBOCCIPITAL CRANIECTOMY CERVICAL LAMINECTOMY N/A 07/22/2012   Procedure: SUBOCCIPITAL CRANIECTOMY CERVICAL  LAMINECTOMY/DURAPLASTY;  Surgeon: Hosie Spangle, MD;  Location: [facility];  Service: Neurosurgery;  Laterality: N/A;  Suboccipital craniectomy with upper cervial laminectomy with duroplasty   . TUBAL LIGATION    . VAGINAL DELIVERY     x3  . WISDOM TOOTH EXTRACTION      OB History    No data available       Home Medications    Prior to Admission medications   Medication Sig Start Date End Date Taking? Authorizing Provider  citalopram (CELEXA) 10 MG tablet Take 10 mg by mouth daily with breakfast.   Yes [provider]  albuterol (PROVENTIL HFA;VENTOLIN HFA) 108 (90 BASE) MCG/ACT inhaler Inhale 2 puffs into the lungs every 6 (six) hours as needed for shortness of breath.     [provider]  BIOTIN PO Take 1 tablet by mouth daily.    [provider]  CALCIUM-MAG-VIT C-VIT D PO Take 2 tablets by mouth daily.    [provider]  meclizine (ANTIVERT) 25 MG tablet Take 25 mg by mouth 3 (three) times daily as needed for dizziness or nausea.    [provider]  naproxen (NAPROSYN) 250 MG tablet Take 1 tablet (250 mg total) by mouth 2 (two) times daily with a meal. 12/20/15   Waynetta Pean, PA-C  Olopatadine HCl (PATADAY) 0.2 % SOLN Place 1 drop into both eyes daily as needed (for alleriges).     [provider]  Phenazopyridine HCl (AZO TABS PO) Take 2 tablets by mouth daily.    [provider]  promethazine (PHENERGAN) 12.5 MG tablet Take 12.5 mg by mouth every 6 (six) hours as needed for nausea or vomiting.    [provider]    Family History Family History  Problem Relation Age of Onset  . Heart attack Mother     Social History Social History  Substance Use Topics  . Smoking status: Never Smoker  . Smokeless tobacco: Never Used  . Alcohol use No     Allergies   Amoxicillin; Imitrex [sumatriptan base]; Penicillins; Tramadol; Norco [hydrocodone-acetaminophen]; Percocet [oxycodone-acetaminophen]; and  Soy allergy   Review of Systems Review of Systems  HENT: Negative for sore throat.   Respiratory: Positive for cough.   All other systems reviewed and are negative.    Physical Exam Triage Vital Signs ED Triage Vitals  Enc Vitals Group     BP 12/15/16 1643 121/78     Pulse Rate 12/15/16 1643 89     Resp 12/15/16 1643 17     Temp 12/15/16 1643 98 F (36.7 C)     Temp Source 12/15/16 1643 Oral     SpO2 12/15/16 1643 100 %     Weight --      Height --      Head Circumference --      Peak Flow --      Pain Score 12/15/16 1642 3     Pain Loc --      Pain Edu? --      Excl. in [place]? --    No data found.   Updated Vital Signs BP 121/78 (BP Location: Left Arm)   Pulse 89   Temp 98 F (36.7 C) (Oral)   Resp 17   SpO2 100%   Visual Acuity Right Eye Distance:   Left Eye Distance:   Bilateral Distance:    Right Eye Near:   Left Eye Near:    Bilateral Near:     Physical Exam  Constitutional: She is oriented to person, place, and time. She appears well-developed and well-nourished.  HENT:  Head: Normocephalic.  Right Ear: External ear normal.  Left Ear: External ear normal.  Nose: Nose normal.  Mouth/Throat: Oropharynx is clear and moist.  Tender maxillary sinuses.  Eyes: EOM are normal.  Neck: Normal range of motion.  Cardiovascular: Normal rate.   Pulmonary/Chest: Effort normal.  Abdominal: She exhibits no distension.  Musculoskeletal: Normal range of motion.  Lymphadenopathy:    She has cervical adenopathy.  Neurological: She is alert and oriented to person, place, and time.  Psychiatric: She has a normal mood and affect.  Nursing note and vitals reviewed.    UC Treatments / Results  Labs (all labs ordered are listed, but only abnormal results are displayed) Labs Reviewed - No data to display  EKG  EKG Interpretation None       Radiology No results found.  Procedures Procedures (including critical care time)  Medications Ordered in  UC Medications - No data to display   Initial Impression / Assessment and Plan / UC Course  I have reviewed the triage vital signs and the nursing notes.  Pertinent labs & imaging results that were available during my care of the patient were reviewed by me and considered in my medical decision making (see chart for details).        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     Controlled Substance Prescriptions [place] Controlled Substance Registry consulted? Not Applicable  An After Visit Summary was printed and given to the patient.    Fransico Meadow, PA-C 12/15/16 1810    Fransico Meadow, [place] 12/15/16 1810

## 2016-12-15 NOTE — ED Triage Notes (Signed)
Patient reports nasal congestion x 1 week. Patient reports she also has noticed bumps on neck and behind ear. Reports fatigue.

## 2017-03-10 ENCOUNTER — Encounter (HOSPITAL_COMMUNITY): Payer: Self-pay | Admitting: Family Medicine

## 2017-03-10 ENCOUNTER — Ambulatory Visit (HOSPITAL_COMMUNITY)
Admission: EM | Admit: 2017-03-10 | Discharge: 2017-03-10 | Disposition: A | Discharge status: Home / Self Care | Payer: Medicaid Other | Attending: Family Medicine | Admitting: Family Medicine

## 2017-03-10 DIAGNOSIS — S29011A Strain of muscle and tendon of front wall of thorax, initial encounter: Secondary | ICD-10-CM | POA: Diagnosis not present

## 2017-03-10 NOTE — ED Triage Notes (Signed)
Pt here for right sided chest pain into back when she moves. Reports that she has been lifting heavy trash cans at work. sts started Thursday.

## 2017-03-10 NOTE — Discharge Instructions (Signed)
You may use over the counter ibuprofen or acetaminophen as needed.  ° °

## 2017-03-10 NOTE — ED Provider Notes (Signed)
[facility]   [id number] 03/10/17 Arrival Time: 1208  ASSESSMENT & PLAN:  1. Muscle strain of chest wall, initial encounter    Prefers OTC ibuprofen 800mg  TID for the next several days. Work note given with restrictions for one week. Will f/u with PCP if needed.  Reviewed expectations re: course of current medical issues. Questions answered. Outlined signs and symptoms indicating need for more acute intervention. Patient verbalized understanding. After Visit Summary given.   SUBJECTIVE:  [name] is a 36 y.o. female who reports pain of her R chest wall. Gradual onset over the past 2-3 days. Noticed after lifting heavy trash cans at work. Describes as an ache with sharp exacerbations. No SOB/n/v. Pain worsened with certain movements. Better with rest. No trauma reported. No OTC treatment.   ROS: As per HPI.   OBJECTIVE:  Vitals:   03/10/17 1311  BP: 112/81  Pulse: 81  Resp: 18  Temp: 98.5 F (36.9 C)  SpO2: 100%    General appearance: alert; no distress Extremities: no cyanosis or edema; symmetrical with no gross deformities Chest wall: tender over R with reproduction of reported pain; no gross deformities Lungs: CTAB CV: normal extremity capillary refill Skin: warm and dry Neurologic: normal gait; normal symmetric reflexes in all extremities; normal sensation Psychological: alert and cooperative; normal mood and affect   Allergies  Allergen Reactions  . Amoxicillin Anaphylaxis and Hives    Has patient had a PCN reaction causing immediate rash, facial/tongue/throat swelling, SOB or lightheadedness with hypotension: Yes Has patient had a PCN reaction causing severe rash involving mucus membranes or skin necrosis: Yes Has patient had a PCN reaction that required hospitalization No Has patient had a PCN reaction occurring within the last 10 years: No If all of the above answers are "NO", then may proceed with Cephalosporin use.   . Imitrex  [Sumatriptan Base] Anaphylaxis and Hives  . Penicillins Anaphylaxis and Hives    Has patient had a PCN reaction causing immediate rash, facial/tongue/throat swelling, SOB or lightheadedness with hypotension: Yes Has patient had a PCN reaction causing severe rash involving mucus membranes or skin necrosis: Yes Has patient had a PCN reaction that required hospitalization No Has patient had a PCN reaction occurring within the last 10 years: No If all of the above answers are "NO", then may proceed with Cephalosporin use.   . Tramadol Shortness Of Breath and Palpitations  . Norco [Hydrocodone-Acetaminophen] Other (See Comments)    Hallucinations   . Percocet [Oxycodone-Acetaminophen] Other (See Comments)    Hallucinations   . Soy Allergy Hives    Past Medical History:  Diagnosis Date  . Anemia    h/o  . Anginal pain ([place])    pt. takes nitrostat- as needed, hasn't had since 04/2012, sees Dr. Terrence Dupont, last ekg was /w PCP- [facility]bere  . Anxiety   . Asthma   . Borderline personality disorder ([place])   . Chiari malformation   . Depression   . GERD (gastroesophageal reflux disease)   . Headache(784.0)   . Meningitis    Oct. 2013  . Neuromuscular disorder ([place])    muscle spasms-back & arms    Social History   Socioeconomic History  . Marital status: Married    Spouse name: Not on file  . Number of children: Not on file  . Years of education: Not on file  . Highest education level: Not on file  Social Needs  . Financial resource strain: Not on file  . Food insecurity -  worry: Not on file  . Food insecurity - inability: Not on file  . Transportation needs - medical: Not on file  . Transportation needs - non-medical: Not on file  Occupational History  . Not on file  Tobacco Use  . Smoking status: Never Smoker  . Smokeless tobacco: Never Used  Substance and Sexual Activity  . Alcohol use: No  . Drug use: No  . Sexual activity: Yes    Birth control/protection: Surgical  Other  Topics Concern  . Not on file  Social History Narrative  . Not on file   Family History  Problem Relation Age of Onset  . Heart attack Mother    Past Surgical History:  Procedure Laterality Date  . ABDOMINAL HYSTERECTOMY    . PERIPHERALLY INSERTED CENTRAL CATHETER INSERTION     during event of Meningitis- then d/c'd to home /w & treated /w antibiotics   . SUBOCCIPITAL CRANIECTOMY CERVICAL LAMINECTOMY/DURAPLASTY N/A 07/22/2012   Performed by Hosie Spangle, MD at [facility]  . TUBAL LIGATION    . VAGINAL DELIVERY     x3  . WISDOM TOOTH EXTRACTION       Vanessa Kick, MD 03/10/17 [phone]

## 2017-03-31 ENCOUNTER — Encounter (HOSPITAL_COMMUNITY): Payer: Self-pay | Admitting: Emergency Medicine

## 2017-03-31 ENCOUNTER — Emergency Department (HOSPITAL_COMMUNITY)
Admission: EM | Admit: 2017-03-31 | Discharge: 2017-03-31 | Disposition: A | Discharge status: Home / Self Care | Payer: Medicare Other | Attending: Emergency Medicine | Admitting: Emergency Medicine

## 2017-03-31 ENCOUNTER — Other Ambulatory Visit: Payer: Self-pay

## 2017-03-31 ENCOUNTER — Emergency Department (HOSPITAL_COMMUNITY): Payer: Medicare Other

## 2017-03-31 DIAGNOSIS — J45909 Unspecified asthma, uncomplicated: Secondary | ICD-10-CM | POA: Diagnosis not present

## 2017-03-31 DIAGNOSIS — R079 Chest pain, unspecified: Secondary | ICD-10-CM | POA: Diagnosis present

## 2017-03-31 DIAGNOSIS — Z885 Allergy status to narcotic agent status: Secondary | ICD-10-CM | POA: Diagnosis not present

## 2017-03-31 DIAGNOSIS — R0789 Other chest pain: Secondary | ICD-10-CM | POA: Diagnosis not present

## 2017-03-31 DIAGNOSIS — Z880 Allergy status to penicillin: Secondary | ICD-10-CM | POA: Insufficient documentation

## 2017-03-31 DIAGNOSIS — Z79899 Other long term (current) drug therapy: Secondary | ICD-10-CM | POA: Diagnosis not present

## 2017-03-31 LAB — BASIC METABOLIC PANEL
Anion gap: 7 (ref 5–15)
BUN: 8 mg/dL (ref 6–20)
CO2: 23 mmol/L (ref 22–32)
Calcium: 9.1 mg/dL (ref 8.9–10.3)
Chloride: 107 mmol/L (ref 101–111)
Creatinine, Ser: 0.59 mg/dL (ref 0.44–1.00)
GFR calc Af Amer: >60 mL/min (ref >60)
GFR calc non Af Amer: >60 mL/min (ref >60)
Glucose, Bld: 110 mg/dL — ABNORMAL HIGH (ref 65–99)
Potassium: 3.3 mmol/L — ABNORMAL LOW (ref 3.5–5.1)
Sodium: 137 mmol/L (ref 135–145)

## 2017-03-31 LAB — I-STAT TROPONIN, ED: Troponin i, poc: 0 ng/mL (ref 0.00–0.08)

## 2017-03-31 LAB — CBC WITH DIFFERENTIAL/PLATELET
Basophils Absolute: 0 10*3/uL (ref 0.0–0.1)
Basophils Relative: 1 %
Eosinophils Absolute: 0.3 10*3/uL (ref 0.0–0.7)
Eosinophils Relative: 6 %
HCT: 36 % (ref 36.0–46.0)
Hemoglobin: 11.9 g/dL — ABNORMAL LOW (ref 12.0–15.0)
Lymphocytes Relative: 40 %
Lymphs Abs: 2.4 10*3/uL (ref 0.7–4.0)
MCH: 28.9 pg (ref 26.0–34.0)
MCHC: 33.1 g/dL (ref 30.0–36.0)
MCV: 87.4 fL (ref 78.0–100.0)
Monocytes Absolute: 0.4 10*3/uL (ref 0.1–1.0)
Monocytes Relative: 7 %
Neutro Abs: 2.9 10*3/uL (ref 1.7–7.7)
Neutrophils Relative %: 48 %
Platelets: 299 10*3/uL (ref 150–400)
RBC: 4.12 MIL/uL (ref 3.87–5.11)
RDW: 13.2 % (ref 11.5–15.5)
WBC: 6 10*3/uL (ref 4.0–10.5)

## 2017-03-31 NOTE — ED Provider Notes (Signed)
[facility] DEPT Provider Note   CSN: [id number] Arrival date & time: 03/31/17  0353     History   Chief Complaint Chief Complaint  Patient presents with  . Chest Pain    HPI [name] is a 36 y.o. female.  36 yo F with a chief complaint of chest pain.  This been going on for the past couple weeks.  She was seen in urgent care and diagnosed with a chest wall strain.  Since then the pain transiently improved and then reoccurred.  It is worse with bending twisting lifting.  She thinks it may be exertional.  Has had some nausea with this.  Some mild shortness of breath.  Denies lower extremity edema denies hemoptysis.  Denies prior PE or DVT.  Denies prior MI.  She thinks it feels like her prior diagnosis of angina.   The history is provided by the patient.  Chest Pain   This is a recurrent problem. The current episode started 2 days ago. The problem occurs constantly. The problem has been gradually worsening. The pain is associated with movement. The pain is present in the substernal region. The pain is at a severity of 10/10. The pain is severe. The quality of the pain is described as heavy and sharp. The pain radiates to the left arm. Duration of episode(s) is 2 weeks. Pertinent negatives include no dizziness, no fever, no headaches, no nausea, no palpitations, no shortness of breath and no vomiting. She has tried nothing for the symptoms. The treatment provided no relief.  Pertinent negatives for past medical history include no DVT, no hyperlipidemia, no hypertension, no MI and no PE.    Past Medical History:  Diagnosis Date  . Anemia    h/o  . Anginal pain ([place])    pt. takes nitrostat- as needed, hasn't had since 04/2012, sees Dr. Terrence Dupont, last ekg was /w PCP- [facility]  . Anxiety   . Asthma   . Borderline personality disorder ([place])   . Chiari malformation   . Depression   . GERD (gastroesophageal reflux disease)   . Headache(784.0)   .  Meningitis    Oct. 2013  . Neuromuscular disorder ([place])    muscle spasms-back & arms     Patient Active Problem List   Diagnosis Date Noted  . SVT (supraventricular tachycardia) ([place]) 01/22/2012  . E coli bacteremia 01/22/2012  . Sepsis(995.91) 01/20/2012  . UTI (lower urinary tract infection) 01/20/2012  . Arnold-Chiari malformation, type I ([place]) 01/20/2012  . Headache(784.0) 01/19/2012    Past Surgical History:  Procedure Laterality Date  . ABDOMINAL HYSTERECTOMY    . PERIPHERALLY INSERTED CENTRAL CATHETER INSERTION     during event of Meningitis- then d/c'd to home /w & treated /w antibiotics   . SUBOCCIPITAL CRANIECTOMY CERVICAL LAMINECTOMY N/A 07/22/2012   Procedure: SUBOCCIPITAL CRANIECTOMY CERVICAL LAMINECTOMY/DURAPLASTY;  Surgeon: Hosie Spangle, MD;  Location: [facility];  Service: Neurosurgery;  Laterality: N/A;  Suboccipital craniectomy with upper cervial laminectomy with duroplasty   . TUBAL LIGATION    . VAGINAL DELIVERY     x3  . WISDOM TOOTH EXTRACTION      OB History    No data available       Home Medications    Prior to Admission medications   Medication Sig Start Date End Date Taking? Authorizing Provider  albuterol (PROVENTIL HFA;VENTOLIN HFA) 108 (90 BASE) MCG/ACT inhaler Inhale 2 puffs into the lungs every 6 (six) hours as needed for  shortness of breath.    Yes [provider]  citalopram (CELEXA) 10 MG tablet Take 10 mg by mouth daily with breakfast.   Yes [provider]    Family History Family History  Problem Relation Age of Onset  . Heart attack Mother     Social History Social History   Tobacco Use  . Smoking status: Never Smoker  . Smokeless tobacco: Never Used  Substance Use Topics  . Alcohol use: No  . Drug use: No     Allergies   Amoxicillin; Imitrex [sumatriptan base]; Penicillins; Tramadol; Norco [hydrocodone-acetaminophen]; Percocet [oxycodone-acetaminophen]; and Soy allergy   Review of  Systems Review of Systems  Constitutional: Negative for chills and fever.  HENT: Negative for congestion and rhinorrhea.   Eyes: Negative for redness and visual disturbance.  Respiratory: Negative for shortness of breath and wheezing.   Cardiovascular: Positive for chest pain. Negative for palpitations.  Gastrointestinal: Negative for nausea and vomiting.  Genitourinary: Negative for dysuria and urgency.  Musculoskeletal: Negative for arthralgias and myalgias.  Skin: Negative for pallor and wound.  Neurological: Negative for dizziness and headaches.     Physical Exam Updated Vital Signs BP 106/63   Pulse (!) 56   Temp 98 F (36.7 C) (Oral)   Resp 12   Ht 5\' 5"  (1.651 m)   Wt 87.5 kg (193 lb)   SpO2 99%   BMI 32.12 kg/m   Physical Exam  Constitutional: She is oriented to person, place, and time. She appears well-developed and well-nourished. No distress.  HENT:  Head: Normocephalic and atraumatic.  Eyes: EOM are normal. Pupils are equal, round, and reactive to light.  Neck: Normal range of motion. Neck supple.  Cardiovascular: Normal rate, regular rhythm and intact distal pulses. Exam reveals no gallop and no friction rub.  No murmur heard. Pulmonary/Chest: Effort normal. She has no wheezes. She has no rales. She exhibits tenderness ( Reproduces the patient's pain.).  Abdominal: Soft. She exhibits no distension and no mass. There is tenderness (mild epigastric). There is no guarding.  Musculoskeletal: She exhibits no edema or tenderness.  Neurological: She is alert and oriented to person, place, and time.  Skin: Skin is warm and dry. She is not diaphoretic.  Psychiatric: She has a normal mood and affect. Her behavior is normal.  Nursing note and vitals reviewed.    ED Treatments / Results  Labs (all labs ordered are listed, but only abnormal results are displayed) Labs Reviewed  CBC WITH DIFFERENTIAL/PLATELET - Abnormal; Notable for the following components:       Result Value   Hemoglobin 11.9 (*)    All other components within normal limits  BASIC METABOLIC PANEL - Abnormal; Notable for the following components:   Potassium 3.3 (*)    Glucose, Bld 110 (*)    All other components within normal limits  I-STAT TROPONIN, ED    EKG  EKG Interpretation None       Radiology Dg Chest 2 View  Result Date: 03/31/2017 CLINICAL DATA:  Central chest pain. EXAM: CHEST  2 VIEW COMPARISON:  December 18, 2015 FINDINGS: The heart size and mediastinal contours are within normal limits. Both lungs are clear. The visualized skeletal structures are unremarkable. IMPRESSION: No active cardiopulmonary disease. Electronically Signed   By: Dorise Bullion III M.D   On: 03/31/2017 05:29    Procedures Procedures (including critical care time)  Medications Ordered in ED Medications - No data to display   Initial Impression / Assessment and  Plan / ED Course  I have reviewed the triage vital signs and the nursing notes.  Pertinent labs & imaging results that were available during my care of the patient were reviewed by me and considered in my medical decision making (see chart for details).     80 yoF with a chief complaint of chest pain.  This is reproduced on palpation.  Is likely musculoskeletal.  Labs were already ordered through triage.  Will obtain an EKG chest x-ray.    The patient's EKG chest x-ray and troponin are unremarkable.  Discharge home.  6:27 AM:  I have discussed the diagnosis/risks/treatment options with the patient and family and believe the pt to be eligible for discharge home to follow-up with PCP. We also discussed returning to the ED immediately if new or worsening sx occur. We discussed the sx which are most concerning (e.g., sudden worsening pain, fever, inability to tolerate by mouth) that necessitate immediate return. Medications administered to the patient during their visit and any new prescriptions provided to the patient are listed  below.  Medications given during this visit Medications - No data to display   The patient appears reasonably screen and/or stabilized for discharge and I doubt any other medical condition or other [facility] requiring further screening, evaluation, or treatment in the ED at this time prior to discharge.    Final Clinical Impressions(s) / ED Diagnoses   Final diagnoses:  Atypical chest pain    ED Discharge Orders    None       Deno Etienne, DO 03/31/17 0802

## 2017-03-31 NOTE — ED Triage Notes (Signed)
Pt arriving with complaint of central chest pain with N/V. Pt describing pain as sharp, squeezing that comes in waves. States the pain increases with movement and when she tries to take a deep breath. Pt has had the symptoms for 2 weeks. Was seen at urgent care approx 1 week ago and diagnosed with chest wall pain. Pt has hx of angina. Pt A&O x4 and ambulatory.

## 2017-03-31 NOTE — Discharge Instructions (Signed)
Take 4 over the counter ibuprofen tablets 3 times a day or 2 over-the-counter naproxen tablets twice a day for pain. Also take tylenol 1000mg(2 extra strength) four times a day.    

## 2017-04-18 ENCOUNTER — Other Ambulatory Visit: Payer: Self-pay

## 2017-04-18 ENCOUNTER — Encounter (HOSPITAL_COMMUNITY): Payer: Self-pay | Admitting: Nurse Practitioner

## 2017-04-18 ENCOUNTER — Emergency Department (HOSPITAL_COMMUNITY)
Admission: EM | Admit: 2017-04-18 | Discharge: 2017-04-18 | Disposition: A | Discharge status: Home / Self Care | Payer: Medicare Other | Attending: Emergency Medicine | Admitting: Emergency Medicine

## 2017-04-18 DIAGNOSIS — J45909 Unspecified asthma, uncomplicated: Secondary | ICD-10-CM | POA: Diagnosis not present

## 2017-04-18 DIAGNOSIS — R45851 Suicidal ideations: Secondary | ICD-10-CM | POA: Insufficient documentation

## 2017-04-18 DIAGNOSIS — F329 Major depressive disorder, single episode, unspecified: Secondary | ICD-10-CM

## 2017-04-18 DIAGNOSIS — Z79899 Other long term (current) drug therapy: Secondary | ICD-10-CM | POA: Insufficient documentation

## 2017-04-18 DIAGNOSIS — F32A Depression, unspecified: Secondary | ICD-10-CM

## 2017-04-18 LAB — COMPREHENSIVE METABOLIC PANEL
ALT: 9 U/L — ABNORMAL LOW (ref 14–54)
AST: 17 U/L (ref 15–41)
Albumin: 3.3 g/dL — ABNORMAL LOW (ref 3.5–5.0)
Alkaline Phosphatase: 66 U/L (ref 38–126)
Anion gap: 3 — ABNORMAL LOW (ref 5–15)
BUN: 8 mg/dL (ref 6–20)
CO2: 24 mmol/L (ref 22–32)
Calcium: 7.9 mg/dL — ABNORMAL LOW (ref 8.9–10.3)
Chloride: 114 mmol/L — ABNORMAL HIGH (ref 101–111)
Creatinine, Ser: 0.52 mg/dL (ref 0.44–1.00)
GFR calc Af Amer: >60 mL/min (ref >60)
GFR calc non Af Amer: >60 mL/min (ref >60)
Glucose, Bld: 92 mg/dL (ref 65–99)
Potassium: 3.3 mmol/L — ABNORMAL LOW (ref 3.5–5.1)
Sodium: 141 mmol/L (ref 135–145)
Total Bilirubin: 0.4 mg/dL (ref 0.3–1.2)
Total Protein: 6.5 g/dL (ref 6.5–8.1)

## 2017-04-18 LAB — CBC WITH DIFFERENTIAL/PLATELET
Basophils Absolute: 0 10*3/uL (ref 0.0–0.1)
Basophils Relative: 1 %
Eosinophils Absolute: 0.2 10*3/uL (ref 0.0–0.7)
Eosinophils Relative: 4 %
HCT: 35.1 % — ABNORMAL LOW (ref 36.0–46.0)
Hemoglobin: 11.6 g/dL — ABNORMAL LOW (ref 12.0–15.0)
Lymphocytes Relative: 30 %
Lymphs Abs: 1.8 10*3/uL (ref 0.7–4.0)
MCH: 29.1 pg (ref 26.0–34.0)
MCHC: 33 g/dL (ref 30.0–36.0)
MCV: 88 fL (ref 78.0–100.0)
Monocytes Absolute: 0.4 10*3/uL (ref 0.1–1.0)
Monocytes Relative: 7 %
Neutro Abs: 3.6 10*3/uL (ref 1.7–7.7)
Neutrophils Relative %: 58 %
Platelets: 286 10*3/uL (ref 150–400)
RBC: 3.99 MIL/uL (ref 3.87–5.11)
RDW: 12.8 % (ref 11.5–15.5)
WBC: 6 10*3/uL (ref 4.0–10.5)

## 2017-04-18 LAB — RAPID URINE DRUG SCREEN, HOSP PERFORMED
Amphetamines: NOT DETECTED
Barbiturates: NOT DETECTED
Benzodiazepines: NOT DETECTED
Cocaine: NOT DETECTED
Opiates: NOT DETECTED
Tetrahydrocannabinol: NOT DETECTED

## 2017-04-18 LAB — I-STAT BETA HCG BLOOD, ED (MC, WL, AP ONLY): I-stat hCG, quantitative: <5 m[IU]/mL (ref <5)

## 2017-04-18 LAB — ACETAMINOPHEN LEVEL: Acetaminophen (Tylenol), Serum: <10 ug/mL — ABNORMAL LOW (ref 10–30)

## 2017-04-18 LAB — LITHIUM LEVEL: Lithium Lvl: <0.06 mmol/L — ABNORMAL LOW (ref 0.60–1.20)

## 2017-04-18 LAB — ETHANOL: Alcohol, Ethyl (B): <10 mg/dL (ref <10)

## 2017-04-18 LAB — SALICYLATE LEVEL: Salicylate Lvl: <7 mg/dL (ref 2.8–30.0)

## 2017-04-18 NOTE — ED Triage Notes (Signed)
Patient states this was not a suicide attempt. Patient states she hasn't slept in days and took a handful of them in hopes they would make her sleep. She just started these medication on 12/19 for her mood swings. Patient stopped them bc they made her feel suicidal so she stopped taking them and hasn't slept sine so that's why she took a handful of the pills

## 2017-04-18 NOTE — Discharge Instructions (Addendum)
For your behavioral health needs you are advised to follow up with [facility] for psychiatry and counseling.  New patients are seen at their walk-in clinic.  Walk-in hours are Monday - Friday from 8:00 am - 12:00 pm, and from 1:00 pm - 3:00 pm.  Walk-in patients are seen on a first come, first served basis, so try to arrive as early as possible for the best chance of being seen the same day.  There is an initial fee of $22.50:       [facility], [place] [address]      [phone]

## 2017-04-18 NOTE — ED Provider Notes (Signed)
[facility] DEPT Provider Note   CSN: [id number] Arrival date & time: 04/18/17  1038     History   Chief Complaint Chief Complaint  Patient presents with  . Drug Overdose    HPI [name] is a 36 y.o. female.  HPI   Depression, snapping, crying, can't sleep. Has been up for 3 days, not sleeping much.  Can't control it. Last week and a half has been severe.  Today was depressed, got up at [place] and did some chores then went back to sleep, then started feeling worse, depressed, angry, tried to take some medicine to go to sleep. Took lithium and latuda--a handful of each.  Thinks some of the latuda dissolved in her mouth.  Did not swallow the lithium.  Prob had about 5-6 latuda, some went on floor, some dissolved. Spit the lithium out, had about a handful in mouth prob more than 10, didn't swallow any.  Was taking them to sleep then realized what she was doing and spit them out.  Husband called the police when she did this.  Started latuda, lithium a few days ago.  Psychiatrist stopped celexa, pt thinks that symptoms worse after stopping it  Reports years ago tried to hurt herself by overdosing. Admits she was feeling suicidal at the time she placed pills in her mouth but then changed her mind.  Past Medical History:  Diagnosis Date  . Anemia    h/o  . Anginal pain ([place])    pt. takes nitrostat- as needed, hasn't had since 04/2012, sees Dr. Terrence Dupont, last ekg was /w PCP- [facility]  . Anxiety   . Asthma   . Borderline personality disorder ([place])   . Chiari malformation   . Depression   . GERD (gastroesophageal reflux disease)   . Headache(784.0)   . Meningitis    Oct. 2013  . Neuromuscular disorder ([place])    muscle spasms-back & arms     Patient Active Problem List   Diagnosis Date Noted  . SVT (supraventricular tachycardia) ([place]) 01/22/2012  . E coli bacteremia 01/22/2012  . Sepsis(995.91) 01/20/2012  . UTI (lower urinary tract  infection) 01/20/2012  . Arnold-Chiari malformation, type I ([place]) 01/20/2012  . Headache(784.0) 01/19/2012    Past Surgical History:  Procedure Laterality Date  . ABDOMINAL HYSTERECTOMY    . PERIPHERALLY INSERTED CENTRAL CATHETER INSERTION     during event of Meningitis- then d/c'd to home /w & treated /w antibiotics   . SUBOCCIPITAL CRANIECTOMY CERVICAL LAMINECTOMY N/A 07/22/2012   Procedure: SUBOCCIPITAL CRANIECTOMY CERVICAL LAMINECTOMY/DURAPLASTY;  Surgeon: Hosie Spangle, MD;  Location: [facility];  Service: Neurosurgery;  Laterality: N/A;  Suboccipital craniectomy with upper cervial laminectomy with duroplasty   . TUBAL LIGATION    . VAGINAL DELIVERY     x3  . WISDOM TOOTH EXTRACTION      OB History    No data available       Home Medications    Prior to Admission medications   Medication Sig Start Date End Date Taking? Authorizing Provider  albuterol (PROVENTIL HFA;VENTOLIN HFA) 108 (90 BASE) MCG/ACT inhaler Inhale 2 puffs into the lungs every 6 (six) hours as needed for shortness of breath.    Yes [provider]  ibuprofen (ADVIL,MOTRIN) 800 MG tablet Take 800 mg by mouth 2 (two) times daily as needed for moderate pain.  04/05/17  Yes [provider]  KLOR-CON 10 10 MEQ tablet Take 10 mEq by mouth daily. 04/05/17  Yes  [provider]  lithium 300 MG tablet Take 300 mg by mouth 2 (two) times daily.   Yes [provider]  lurasidone (LATUDA) 40 MG TABS tablet Take 40 mg by mouth daily with breakfast.   Yes [provider]    Family History Family History  Problem Relation Age of Onset  . Heart attack Mother     Social History Social History   Tobacco Use  . Smoking status: Never Smoker  . Smokeless tobacco: Never Used  Substance Use Topics  . Alcohol use: No  . Drug use: No     Allergies   Amoxicillin; Imitrex [sumatriptan base]; Penicillins; Tramadol; Norco [hydrocodone-acetaminophen]; Percocet  [oxycodone-acetaminophen]; and Soy allergy   Review of Systems Review of Systems  Constitutional: Positive for fatigue. Negative for fever.  HENT: Negative for sore throat.   Eyes: Negative for visual disturbance.  Respiratory: Negative for cough and shortness of breath.   Cardiovascular: Negative for chest pain.  Gastrointestinal: Negative for abdominal pain.  Genitourinary: Negative for difficulty urinating.  Musculoskeletal: Negative for back pain and neck pain.  Skin: Negative for rash.  Neurological: Negative for syncope and headaches.  Psychiatric/Behavioral: Positive for dysphoric mood, sleep disturbance and suicidal ideas.     Physical Exam Updated Vital Signs BP (!) 136/101   Pulse 90   Temp 98.6 F (37 C) (Oral)   Resp 18   Ht 5\' 5"  (1.651 m)   Wt 87.5 kg (193 lb)   SpO2 98%   BMI 32.12 kg/m   Physical Exam  Constitutional: She is oriented to person, place, and time. She appears well-developed and well-nourished. No distress.  HENT:  Head: Normocephalic and atraumatic.  Eyes: Conjunctivae and EOM are normal.  Neck: Normal range of motion.  Cardiovascular: Normal rate, regular rhythm, normal heart sounds and intact distal pulses. Exam reveals no gallop and no friction rub.  No murmur heard. Pulmonary/Chest: Effort normal and breath sounds normal. No respiratory distress. She has no wheezes. She has no rales.  Abdominal: Soft. She exhibits no distension. There is no tenderness. There is no guarding.  Musculoskeletal: She exhibits no edema or tenderness.  Neurological: She is alert and oriented to person, place, and time.  Skin: Skin is warm and dry. No rash noted. She is not diaphoretic. No erythema.  Nursing note and vitals reviewed.    ED Treatments / Results  Labs (all labs ordered are listed, but only abnormal results are displayed) Labs Reviewed  COMPREHENSIVE METABOLIC PANEL - Abnormal; Notable for the following components:      Result Value    Potassium 3.3 (*)    Chloride 114 (*)    Calcium 7.9 (*)    Albumin 3.3 (*)    ALT 9 (*)    Anion gap 3 (*)    All other components within normal limits  CBC WITH DIFFERENTIAL/PLATELET - Abnormal; Notable for the following components:   Hemoglobin 11.6 (*)    HCT 35.1 (*)    All other components within normal limits  ACETAMINOPHEN LEVEL - Abnormal; Notable for the following components:   Acetaminophen (Tylenol), Serum <10 (*)    All other components within normal limits  LITHIUM LEVEL - Abnormal; Notable for the following components:   Lithium Lvl <0.06 (*)    All other components within normal limits  ETHANOL  RAPID URINE DRUG SCREEN, HOSP PERFORMED  SALICYLATE LEVEL  I-STAT BETA HCG BLOOD, ED ([facility], [facility], [facility] ONLY)    EKG  EKG Interpretation None  Radiology No results found.  Procedures Procedures (including critical care time)  Medications Ordered in ED Medications - No data to display   Initial Impression / Assessment and Plan / ED Course  I have reviewed the triage vital signs and the nursing notes.  Pertinent labs & imaging results that were available during my care of the patient were reviewed by me and considered in my medical decision making (see chart for details).     36yo female with history above presents with concern for depression, sleep disturbance, with moment of SI when she put latuda and lithium in her mouth then spit them out before swallowing.  Labs, EKG show no acute findings, no sign of overdose. Lithium level undetectable.  Consulted TTS who evaluated the patient.  She no longer expresses SI at this time. TTS recommending outpatient treatment which patient and husband are comfortable with.   She contracts for safety, reports she will return if SI returns. Will follow up with family services and PCP. Patient discharged in stable condition with understanding of reasons to return.   Final Clinical Impressions(s) / ED Diagnoses   Final diagnoses:   Depression, unspecified depression type    ED Discharge Orders    None       Gareth Morgan, MD 04/18/17 2213

## 2017-04-18 NOTE — ED Notes (Signed)
Bed: [facility] Expected date:  Expected time:  Means of arrival:  Comments: Hold for room 7

## 2017-04-18 NOTE — ED Notes (Signed)
Report given to [facility] RN 

## 2017-04-18 NOTE — ED Notes (Signed)
Bed: [facility] Expected date:  Expected time:  Means of arrival:  Comments: EMS-OD

## 2017-04-18 NOTE — ED Notes (Signed)
Pt discharged safely after reviewing discharge instructions.  Pt was in no distress.  All belongings were returned to patient at discharge.

## 2017-04-18 NOTE — ED Notes (Signed)
Pt oriented to room and unit.  Pt is calm and cooperative.  Pt denies S/I and H/I.  Pt contracts for safety.  15 minute checks and video monitoring in place.
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
## 2017-04-18 NOTE — BH Assessment (Signed)
[facility] Assessment Progress Note  Per Ethelene Hal, FNP, this pt does not require psychiatric hospitalization at this time.  Pt is to be discharged from [facility] with recommendation to follow up with [facility] [place].  This has been included in pt's discharge instructions.  Pt's nurse has been notified.  Jalene Mullet, [place] Triage Specialist [phone]
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
## 2018-03-01 ENCOUNTER — Ambulatory Visit (HOSPITAL_COMMUNITY)
Admission: RE | Admit: 2018-03-01 | Discharge: 2018-03-01 | Disposition: A | Discharge status: Home / Self Care | Payer: Medicare Other | Attending: Endocrinology | Admitting: Endocrinology

## 2018-03-01 ENCOUNTER — Encounter (HOSPITAL_COMMUNITY): Payer: Self-pay | Admitting: Clinical

## 2018-03-01 DIAGNOSIS — Z9889 Other specified postprocedural states: Secondary | ICD-10-CM | POA: Insufficient documentation

## 2018-03-01 DIAGNOSIS — Z880 Allergy status to penicillin: Secondary | ICD-10-CM | POA: Diagnosis not present

## 2018-03-01 DIAGNOSIS — Z91048 Other nonmedicinal substance allergy status: Secondary | ICD-10-CM | POA: Insufficient documentation

## 2018-03-01 DIAGNOSIS — F603 Borderline personality disorder: Secondary | ICD-10-CM | POA: Diagnosis not present

## 2018-03-01 DIAGNOSIS — F419 Anxiety disorder, unspecified: Secondary | ICD-10-CM | POA: Diagnosis not present

## 2018-03-01 DIAGNOSIS — Z91018 Allergy to other foods: Secondary | ICD-10-CM | POA: Diagnosis not present

## 2018-03-01 DIAGNOSIS — F310 Bipolar disorder, current episode hypomanic: Secondary | ICD-10-CM | POA: Diagnosis not present

## 2018-03-01 DIAGNOSIS — F418 Other specified anxiety disorders: Secondary | ICD-10-CM | POA: Insufficient documentation

## 2018-03-01 NOTE — H&P (Signed)
Behavioral Health Medical Screening Exam  [name] is an 37 y.o. female.  Total Time spent with patient: 20 minutes  Psychiatric Specialty Exam: Physical Exam  Nursing note and vitals reviewed. Constitutional: She is oriented to person, place, and time. She appears well-developed and well-nourished.  Cardiovascular: Normal rate.  Respiratory: Effort normal.  Musculoskeletal: Normal range of motion.  Neurological: She is alert and oriented to person, place, and time.  Skin: Skin is warm.    Review of Systems  Constitutional: Negative.   HENT: Negative.   Eyes: Negative.   Respiratory: Negative.   Cardiovascular: Negative.   Gastrointestinal: Negative.   Genitourinary: Negative.   Musculoskeletal: Negative.   Skin: Negative.   Neurological: Negative.   Endo/Heme/Allergies: Negative.   Psychiatric/Behavioral: Positive for depression. Negative for suicidal ideas.    Blood pressure 108/72, pulse 87, temperature 98.2 F (36.8 C), SpO2 100 %.There is no height or weight on file to calculate BMI.  General Appearance: Casual and Fairly Groomed  Eye Contact:  Fair  Speech:  Clear and Coherent and Normal Rate  Volume:  Decreased  Mood:  Depressed  Affect:  Flat  Thought Process:  Linear and Descriptions of Associations: Intact  Orientation:  Full (Time, Place, and Person)  Thought Content:  WDL  Suicidal Thoughts:  No  Homicidal Thoughts:  No  Memory:  Immediate;   Good Recent;   Good Remote;   Good  Judgement:  Fair  Insight:  Fair  Psychomotor Activity:  Normal  Concentration: Concentration: Good and Attention Span: Good  Recall:  Good  Fund of Knowledge:Good  Language: Good  Akathisia:  No  Handed:  Right  AIMS (if indicated):     Assets:  Communication Skills Desire for Improvement Financial Resources/Insurance Housing Physical Health Social Support Transportation  Sleep:       Musculoskeletal: Strength & Muscle Tone: within normal limits Gait &  Station: normal Patient leans: N/A  Blood pressure 108/72, pulse 87, temperature 98.2 F (36.8 C), SpO2 100 %.  Recommendations:  Based on my evaluation the patient does not appear to have an emergency medical condition.  [place], FNP 03/01/2018, 2:01 PM
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
## 2018-04-20 ENCOUNTER — Encounter (HOSPITAL_COMMUNITY): Payer: Self-pay

## 2018-04-20 ENCOUNTER — Emergency Department (HOSPITAL_COMMUNITY)
Admission: EM | Admit: 2018-04-20 | Discharge: 2018-04-20 | Disposition: A | Discharge status: Home / Self Care | Payer: Medicare Other | Attending: Emergency Medicine | Admitting: Emergency Medicine

## 2018-04-20 DIAGNOSIS — Z79899 Other long term (current) drug therapy: Secondary | ICD-10-CM | POA: Insufficient documentation

## 2018-04-20 DIAGNOSIS — F329 Major depressive disorder, single episode, unspecified: Secondary | ICD-10-CM | POA: Diagnosis present

## 2018-04-20 DIAGNOSIS — J45909 Unspecified asthma, uncomplicated: Secondary | ICD-10-CM | POA: Insufficient documentation

## 2018-04-20 DIAGNOSIS — R45851 Suicidal ideations: Secondary | ICD-10-CM | POA: Insufficient documentation

## 2018-04-20 DIAGNOSIS — F330 Major depressive disorder, recurrent, mild: Secondary | ICD-10-CM | POA: Diagnosis present

## 2018-04-20 DIAGNOSIS — F332 Major depressive disorder, recurrent severe without psychotic features: Secondary | ICD-10-CM | POA: Diagnosis not present

## 2018-04-20 HISTORY — DX: Major depressive disorder, recurrent, mild: F33.0

## 2018-04-20 LAB — COMPREHENSIVE METABOLIC PANEL
ALT: 14 U/L (ref 0–44)
AST: 22 U/L (ref 15–41)
Albumin: 4 g/dL (ref 3.5–5.0)
Alkaline Phosphatase: 62 U/L (ref 38–126)
Anion gap: 9 (ref 5–15)
BUN: 7 mg/dL (ref 6–20)
CO2: 23 mmol/L (ref 22–32)
Calcium: 9.3 mg/dL (ref 8.9–10.3)
Chloride: 107 mmol/L (ref 98–111)
Creatinine, Ser: 0.68 mg/dL (ref 0.44–1.00)
GFR calc Af Amer: >60 mL/min (ref >60)
GFR calc non Af Amer: >60 mL/min (ref >60)
Glucose, Bld: 118 mg/dL — ABNORMAL HIGH (ref 70–99)
Potassium: 3.3 mmol/L — ABNORMAL LOW (ref 3.5–5.1)
Sodium: 139 mmol/L (ref 135–145)
Total Bilirubin: 0.6 mg/dL (ref 0.3–1.2)
Total Protein: 7.8 g/dL (ref 6.5–8.1)

## 2018-04-20 LAB — CBC
HCT: 40.8 % (ref 36.0–46.0)
Hemoglobin: 13 g/dL (ref 12.0–15.0)
MCH: 28.9 pg (ref 26.0–34.0)
MCHC: 31.9 g/dL (ref 30.0–36.0)
MCV: 90.7 fL (ref 80.0–100.0)
Platelets: 270 10*3/uL (ref 150–400)
RBC: 4.5 MIL/uL (ref 3.87–5.11)
RDW: 13.1 % (ref 11.5–15.5)
WBC: 4.9 10*3/uL (ref 4.0–10.5)
nRBC: 0 % (ref 0.0–0.2)

## 2018-04-20 LAB — RAPID URINE DRUG SCREEN, HOSP PERFORMED
Amphetamines: NOT DETECTED
Barbiturates: NOT DETECTED
Benzodiazepines: NOT DETECTED
Cocaine: NOT DETECTED
Opiates: NOT DETECTED
Tetrahydrocannabinol: NOT DETECTED

## 2018-04-20 LAB — HCG, QUANTITATIVE, PREGNANCY: hCG, Beta Chain, Quant, S: <1 m[IU]/mL (ref <5)

## 2018-04-20 LAB — I-STAT BETA HCG BLOOD, ED (MC, WL, AP ONLY): I-stat hCG, quantitative: 18.3 m[IU]/mL — ABNORMAL HIGH (ref <5)

## 2018-04-20 LAB — ACETAMINOPHEN LEVEL: Acetaminophen (Tylenol), Serum: <10 ug/mL — ABNORMAL LOW (ref 10–30)

## 2018-04-20 LAB — SALICYLATE LEVEL: Salicylate Lvl: <7 mg/dL (ref 2.8–30.0)

## 2018-04-20 LAB — ETHANOL: Alcohol, Ethyl (B): <10 mg/dL (ref <10)

## 2018-04-20 MED ORDER — CITALOPRAM HYDROBROMIDE 10 MG PO TABS: 10.0000 mg | ORAL_TABLET | Freq: Every day | ORAL | 0 refills | Status: DC

## 2018-04-20 MED ORDER — CITALOPRAM HYDROBROMIDE 10 MG PO TABS
10.0000 mg | ORAL_TABLET | Freq: Every day | ORAL | Status: DC
  Administered 2018-04-20: 10 mg via ORAL
  Filled 2018-04-20: qty 1

## 2018-04-20 MED ORDER — POTASSIUM CHLORIDE CRYS ER 20 MEQ PO TBCR
40.0000 meq | EXTENDED_RELEASE_TABLET | Freq: Once | ORAL | Status: AC
  Administered 2018-04-20: 40 meq via ORAL
  Filled 2018-04-20: qty 2

## 2018-04-20 MED ORDER — ALUM & MAG HYDROXIDE-SIMETH 200-200-20 MG/5ML PO SUSP: 30.0000 mL | Freq: Four times a day (QID) | ORAL | Status: DC | PRN

## 2018-04-20 NOTE — ED Triage Notes (Signed)
Pt states that she was having suicidal thoughts this am and woke her husband up to bring her here She says that she's been really depressed and was thinking about taking a lot of pills and shaving her head

## 2018-04-20 NOTE — BH Assessment (Signed)
[facility] Assessment Progress Note Case was staffed with Jake Samples MD, Marion Downer who also evaluated patient and recommended patient be discharged. Patient will be evlauted for possible medication interventions prior to discharge and encouraged to follow up with current provider.

## 2018-04-20 NOTE — ED Provider Notes (Signed)
[facility] DEPT Provider Note   CSN: [id number] Arrival date & time: 04/20/18  9702     History   Chief Complaint Chief Complaint  Patient presents with  . Suicidal    HPI [name] is a 37 y.o. female.  HPI   37 yo F with history as below including borderline, bipolar here with suicidal ideation. Pt states that she's had progressively worsening depression. She has dysphoria, anhedonia. She has a h/o suicide attempts and ahs found herself wanting to harm herself more frequently. Earlier tonight, she felt extremely depressed and hopeless. She got the urge to harm herself, cut her hair, and take "all her meds." She recognized this, notified her husband and removed herself from the home to prevent any "bad decisions." Denies alcohol or drug use. No other complaints.  Past Medical History:  Diagnosis Date  . Anemia    h/o  . Anginal pain ([place])    pt. takes nitrostat- as needed, hasn't had since 04/2012, sees Dr. Terrence Dupont, last ekg was /w PCP- [facility]  . Anxiety   . Asthma   . Borderline personality disorder ([place])   . Chiari malformation   . Depression   . GERD (gastroesophageal reflux disease)   . Headache(784.0)   . Meningitis    Oct. 2013  . Neuromuscular disorder ([place])    muscle spasms-back & arms     Patient Active Problem List   Diagnosis Date Noted  . SVT (supraventricular tachycardia) ([place]) 01/22/2012  . E coli bacteremia 01/22/2012  . Sepsis(995.91) 01/20/2012  . UTI (lower urinary tract infection) 01/20/2012  . 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 Headache(784.0) 01/19/2012    Past Surgical History:  Procedure Laterality Date  . ABDOMINAL HYSTERECTOMY    . PERIPHERALLY INSERTED CENTRAL CATHETER INSERTION     during event of Meningitis- then d/c'd to home /w & treated /w antibiotics   . SUBOCCIPITAL CRANIECTOMY CERVICAL LAMINECTOMY N/A 07/22/2012   Procedure: SUBOCCIPITAL CRANIECTOMY CERVICAL  LAMINECTOMY/DURAPLASTY;  Surgeon: Hosie Spangle, MD;  Location: [facility];  Service: Neurosurgery;  Laterality: N/A;  Suboccipital craniectomy with upper cervial laminectomy with duroplasty   . TUBAL LIGATION    . VAGINAL DELIVERY     x3  . WISDOM TOOTH EXTRACTION       OB History   No obstetric history on file.      Home Medications    Prior to Admission medications   Medication Sig Start Date End Date Taking? Authorizing Provider  albuterol (PROVENTIL HFA;VENTOLIN HFA) 108 (90 BASE) MCG/ACT inhaler Inhale 2 puffs into the lungs every 6 (six) hours as needed for shortness of breath.     [provider]  ibuprofen (ADVIL,MOTRIN) 800 MG tablet Take 800 mg by mouth 2 (two) times daily as needed for moderate pain.  04/05/17   [provider]  KLOR-CON 10 10 MEQ tablet Take 10 mEq by mouth daily. 04/05/17   [provider]  lithium 300 MG tablet Take 300 mg by mouth 2 (two) times daily.    [provider]  lurasidone (LATUDA) 40 MG TABS tablet Take 40 mg by mouth daily with breakfast.    [provider]    Family History Family History  Problem Relation Age of Onset  . Heart attack Mother     Social History Social History   Tobacco Use  . Smoking status: Never Smoker  . Smokeless tobacco: Never Used  Substance Use Topics  . Alcohol use:  No  . Drug use: No     Allergies   Amoxicillin; Imitrex [sumatriptan base]; Penicillins; Tramadol; Norco [hydrocodone-acetaminophen]; Percocet [oxycodone-acetaminophen]; and Soy allergy   Review of Systems Review of Systems  Constitutional: Negative for chills, fatigue and fever.  HENT: Negative for congestion and rhinorrhea.   Eyes: Negative for visual disturbance.  Respiratory: Negative for cough, shortness of breath and wheezing.   Cardiovascular: Negative for chest pain and leg swelling.  Gastrointestinal: Negative for abdominal pain, diarrhea, nausea and vomiting.    Genitourinary: Negative for dysuria and flank pain.  Musculoskeletal: Negative for neck pain and neck stiffness.  Skin: Negative for rash and wound.  Allergic/Immunologic: Negative for immunocompromised state.  Neurological: Negative for syncope, weakness and headaches.  Psychiatric/Behavioral: Positive for dysphoric mood and suicidal ideas.  All other systems reviewed and are negative.    Physical Exam Updated Vital Signs BP 119/67 (BP Location: Left Arm)   Pulse 89   Temp 98.5 F (36.9 C) (Oral)   Resp 15   SpO2 100%   Physical Exam Vitals signs and nursing note reviewed.  Constitutional:      General: She is not in acute distress.    Appearance: She is well-developed.  HENT:     Head: Normocephalic and atraumatic.  Eyes:     Conjunctiva/sclera: Conjunctivae normal.  Neck:     Musculoskeletal: Neck supple.  Cardiovascular:     Rate and Rhythm: Normal rate and regular rhythm.     Heart sounds: Normal heart sounds. No murmur. No friction rub.  Pulmonary:     Effort: Pulmonary effort is normal. No respiratory distress.     Breath sounds: Normal breath sounds. No wheezing or rales.  Abdominal:     General: There is no distension.     Palpations: Abdomen is soft.     Tenderness: There is no abdominal tenderness.  Skin:    General: Skin is warm.     Capillary Refill: Capillary refill takes less than 2 seconds.  Neurological:     Mental Status: She is alert and oriented to person, place, and time.     Motor: No abnormal muscle tone.  Psychiatric:        Mood and Affect: Mood is depressed.        Thought Content: Thought content includes suicidal ideation. Thought content includes suicidal plan.        Judgment: Judgment is impulsive and inappropriate.      ED Treatments / Results  Labs (all labs ordered are listed, but only abnormal results are displayed) Labs Reviewed  COMPREHENSIVE METABOLIC PANEL - Abnormal; Notable for the following components:      Result  Value   Potassium 3.3 (*)    Glucose, Bld 118 (*)    All other components within normal limits  ACETAMINOPHEN LEVEL - Abnormal; Notable for the following components:   Acetaminophen (Tylenol), Serum <10 (*)    All other components within normal limits  I-STAT BETA HCG BLOOD, ED ([facility], [facility], [facility] ONLY) - Abnormal; Notable for the following components:   I-stat hCG, quantitative 18.3 (*)    All other components within normal limits  ETHANOL  SALICYLATE LEVEL  CBC  RAPID URINE DRUG SCREEN, HOSP PERFORMED  HCG, QUANTITATIVE, PREGNANCY  LITHIUM LEVEL    EKG None  Radiology No results found.  Procedures Procedures (including critical care time)  Medications Ordered in ED Medications  alum & mag hydroxide-simeth (MAALOX/MYLANTA) 200-200-20 MG/5ML suspension 30 mL (has no administration in time range)  Initial Impression / Assessment and Plan / ED Course  I have reviewed the triage vital signs and the nursing notes.  Pertinent labs & imaging results that were available during my care of the patient were reviewed by me and considered in my medical decision making (see chart for details).     37 yo F here w/ depression, SI. Labs reassuring. K replaced. HCG mildly positive but she is s/p hysterectomy - suspect this is erroneous, if positive can f/u with OB as an outpt as this does not likely represent pregnancy. TTS consulted.  Final Clinical Impressions(s) / ED Diagnoses   Final diagnoses:  Suicidal ideation  Suicidal thoughts    ED Discharge Orders    None       Duffy Bruce, MD 04/20/18 [phone]

## 2018-04-20 NOTE — ED Notes (Signed)
Pt discharged home. Discharged instructions read to pt who verbalized understanding. All belongings returned to pt. Denies SI/HI, is not delusional and not responding to internal stimuli. Escorted pt to the ED exit.   

## 2018-04-20 NOTE — BH Assessment (Signed)
Assessment Note  [name] is an 37 y.o. female that presents this date with increased depression and S/I. Patient denies any plan/intent, H/I or AVH. Patient is alert and oriented x 4 presenting with appropriate affect. Patient states she is currently receiving services from [facility] that assists with medication management. Patient states she was diagnosed with depression over 5 years ago and reports she has recently discontinued her Abilify 2 weeks ago due to side affects that included: increased depression and thoughts of self harm. Patient states since discontinuing that medication her symptoms have worsened to include: excessive fatigue and guilt. Patient reports current stressors to include family issues during the [name] and financial issues. Patient denies any prior history of self harm or self injurious behaviors. Per notes, patient presents with S/I and depression. Patient states that she's had progressively worsening depression. She has dysphoria and anhedonia. Patient states that she was having suicidal thoughts this morning and woke her husband up to bring her here. Patient states she has been really depressed and was thinking about taking a lot of pills and shaving her head. Patient denies S/I during assessment. Patient renders conflicting history denying any self harm but admits to a history of suicide earlier on admission. Per last assessment on 03/01/18 when patient presented to [facility] for increased depression, denied any prior history of self harm at that time also. Patient was assessed per that note on 03/01/18 and did not meet criteria at that time for a inpatient admission. Earlier today patient reported she felt extremely depressed and hopeless. She got the urge to harm herself, cut her hair, and take "all her meds." She recognized this, notified her husband and removed herself from the home to prevent any "bad decisions." Denies alcohol or drug use. UDS is negative and BAL  less than 5. Patient denies any history of SA issues. Patient currently contracts for safety and is requesting to be discharged. Patient is requesting that her medications be reviewed prior to discharge to assist with depressive symptoms. Case was staffed with Evelyn Samples MD, [place] Downer who also evaluated patient and recommended patient be discharged. Patient will be evlauted for possible medication interventions prior to discharge and encouraged to follow up with current provider.      Diagnosis: F33.2 MDD recurrent without psychotic features  Past Medical History:  Past Medical History:  Diagnosis Date  . Anemia    h/o  . Anginal pain ([place])    pt. takes nitrostat- as needed, hasn't had since 04/2012, sees Dr. Terrence Dupont, last ekg was /w PCP- [facility]  . Anxiety   . Asthma   . Borderline personality disorder ([place])   . Chiari malformation   . Depression   . GERD (gastroesophageal reflux disease)   . Headache(784.0)   . Meningitis    Oct. 2013  . Neuromuscular disorder ([place])    muscle spasms-back & arms     Past Surgical History:  Procedure Laterality Date  . ABDOMINAL HYSTERECTOMY    . PERIPHERALLY INSERTED CENTRAL CATHETER INSERTION     during event of Meningitis- then d/c'd to home /w & treated /w antibiotics   . SUBOCCIPITAL CRANIECTOMY CERVICAL LAMINECTOMY N/A 07/22/2012   Procedure: SUBOCCIPITAL CRANIECTOMY CERVICAL LAMINECTOMY/DURAPLASTY;  Surgeon: Hosie Spangle, MD;  Location: [facility];  Service: Neurosurgery;  Laterality: N/A;  Suboccipital craniectomy with upper cervial laminectomy with duroplasty   . TUBAL LIGATION    . VAGINAL DELIVERY     x3  . WISDOM TOOTH EXTRACTION  Family History:  Family History  Problem Relation Age of Onset  . Heart attack Mother     Social History:  reports that she has never smoked. She has never used smokeless tobacco. She reports that she does not drink alcohol or use drugs.  Additional Social History:  Alcohol / Drug Use Pain  Medications: see MAR Prescriptions: see MAR Over the Counter: see MAR History of alcohol / drug use?: No history of alcohol / drug abuse Longest period of sobriety (when/how long): patient denies Negative Consequences of Use: (NA) Withdrawal Symptoms: (NA)  CIWA: CIWA-Ar BP: 119/67 Pulse Rate: 89 COWS:    Allergies:  Allergies  Allergen Reactions  . Amoxicillin Anaphylaxis and Hives    Has patient had a PCN reaction causing immediate rash, facial/tongue/throat swelling, SOB or lightheadedness with hypotension: Yes Has patient had a PCN reaction causing severe rash involving mucus membranes or skin necrosis: Yes Has patient had a PCN reaction that required hospitalization No Has patient had a PCN reaction occurring within the last 10 years: No If all of the above answers are "NO", then may proceed with Cephalosporin use.   . Imitrex [Sumatriptan Base] Anaphylaxis and Hives  . Penicillins Anaphylaxis and Hives    Has patient had a PCN reaction causing immediate rash, facial/tongue/throat swelling, SOB or lightheadedness with hypotension: Yes Has patient had a PCN reaction causing severe rash involving mucus membranes or skin necrosis: Yes Has patient had a PCN reaction that required hospitalization No Has patient had a PCN reaction occurring within the last 10 years: No If all of the above answers are "NO", then may proceed with Cephalosporin use.   . Tramadol Shortness Of Breath and Palpitations  . Norco [Hydrocodone-Acetaminophen] Other (See Comments)    Hallucinations   . Percocet [Oxycodone-Acetaminophen] Other (See Comments)    Hallucinations   . Soy Allergy Hives    Home Medications: (Not in a hospital admission)   OB/GYN Status:  No LMP recorded. Patient has had a hysterectomy.  General Assessment Data [facility] of Assessment: [facility] TTS Assessment: In system Is this a Tele or Face-to-Face Assessment?: Face-to-Face Is this an Initial Assessment or a Re-assessment  for this encounter?: Initial Assessment Patient Accompanied by:: N/A Language Other than English: No Living Arrangements: Other (Comment)(Spouse) What gender do you identify as?: Female Marital status: Married Pharmacist, community name: [place] Pregnancy Status: No Living Arrangements: Spouse/significant other Can pt return to current living arrangement?: Yes Admission Status: Voluntary Is patient capable of signing voluntary admission?: Yes Referral Source: Self/Family/Friend Insurance type: Medicare  Medical Screening Exam ([place]) Medical Exam completed: Yes  Crisis Care Plan Living Arrangements: Spouse/significant other Legal Guardian: (NA) Name of Psychiatrist: (NA) Name of Therapist: (NA)  Education Status Is patient currently in school?: No Is the patient employed, unemployed or receiving disability?: Unemployed  Risk to self with the past 6 months Suicidal Ideation: No Has patient been a risk to self within the past 6 months prior to admission? : No Suicidal Intent: No Has patient had any suicidal intent within the past 6 months prior to admission? : No Is patient at risk for suicide?: No, but patient needs Medical Clearance Suicidal Plan?: No Has patient had any suicidal plan within the past 6 months prior to admission? : No Access to Means: No What has been your use of drugs/alcohol within the last 12 months?: Denies Previous Attempts/Gestures: No How many times?: 0 Other Self Harm Risks: (NA) Triggers for Past Attempts: Unknown Intentional Self Injurious  Behavior: None Family Suicide History: No Recent stressful life event(s): Other (Comment)(Stress from [facility]) Persecutory voices/beliefs?: No Depression: Yes Depression Symptoms: Fatigue, Guilt Substance abuse history and/or treatment for substance abuse?: No Suicide prevention information given to non-admitted patients: Not applicable  Risk to Others within the past 6 months Homicidal Ideation: No Does  patient have any lifetime risk of violence toward others beyond the six months prior to admission? : No Thoughts of Harm to Others: No Current Homicidal Intent: No Current Homicidal Plan: No Access to Homicidal Means: No Identified Victim: NA History of harm to others?: No Assessment of Violence: None Noted Violent Behavior Description: NA Does patient have access to weapons?: No Criminal Charges Pending?: No Does patient have a court date: No Is patient on probation?: No  Psychosis Hallucinations: None noted Delusions: None noted  Mental Status Report Appearance/Hygiene: In scrubs Eye Contact: Good Motor Activity: Freedom of movement Speech: Logical/coherent Level of Consciousness: Alert Mood: Pleasant Affect: Appropriate to circumstance Anxiety Level: Minimal Thought Processes: Coherent, Relevant Judgement: Partial Orientation: Person, Place, Time Obsessive Compulsive Thoughts/Behaviors: None  Cognitive Functioning Concentration: Normal Memory: Recent Intact, Remote Intact Is patient IDD: No Insight: Good Impulse Control: Fair Appetite: Good Have you had any weight changes? : No Change Sleep: No Change Total Hours of Sleep: 7 Vegetative Symptoms: None  ADLScreening [facility] Assessment Services) Patient's cognitive ability adequate to safely complete daily activities?: Yes Patient able to express need for assistance with ADLs?: Yes Independently performs ADLs?: Yes (appropriate for developmental age)  Prior Inpatient Therapy Prior Inpatient Therapy: Yes Prior Therapy Dates: 2019 Prior Therapy Facilty/Provider(s): [facility] Reason for Treatment: MH issues  Prior Outpatient Therapy Prior Outpatient Therapy: Yes Prior Therapy Dates: Ongoing Prior Therapy Facilty/Provider(s): [facility]  Reason for Treatment: Med mang Does patient have an ACCT team?: No Does patient have Intensive In-House Services?  : No Does patient have [facility] services? : No Does patient have  P4CC services?: No  ADL Screening (condition at time of admission) Patient's cognitive ability adequate to safely complete daily activities?: Yes Is the patient deaf or have difficulty hearing?: No Does the patient have difficulty seeing, even when wearing glasses/contacts?: No Does the patient have difficulty concentrating, remembering, or making decisions?: No Patient able to express need for assistance with ADLs?: Yes Does the patient have difficulty dressing or bathing?: No Independently performs ADLs?: Yes (appropriate for developmental age) Does the patient have difficulty walking or climbing stairs?: No Weakness of Legs: None Weakness of Arms/Hands: None  Home Assistive Devices/Equipment Home Assistive Devices/Equipment: None  Therapy Consults (therapy consults require a physician order) PT Evaluation Needed: No OT Evalulation Needed: No SLP Evaluation Needed: No Abuse/Neglect Assessment (Assessment to be complete while patient is alone) Physical Abuse: Denies Verbal Abuse: Denies Sexual Abuse: Denies Exploitation of patient/patient's resources: Denies Self-Neglect: Denies Values / Beliefs Cultural Requests During Hospitalization: None Spiritual Requests During Hospitalization: None Consults Spiritual Care Consult Needed: No Social Work Consult Needed: No Regulatory affairs officer (For Healthcare) Does Patient Have a Medical Advance Directive?: No Would patient like information on creating a medical advance directive?: No - Patient declined          Disposition: Case was staffed with Evelyn Samples MD, [place] who also evaluated patient and recommended patient be discharged. Patient will be evlauted for possible medication interventions prior to discharge and encouraged to follow up with current provider. Disposition Initial Assessment Completed for this Encounter: Yes Disposition of Patient: Discharge Patient refused recommended treatment: No Mode of transportation if patient  is discharged/movement?: Car Patient referred to: Outpatient clinic referral  On Site Evaluation by:   Reviewed with Physician:    Mamie Nick 04/20/2018 9:04 AM

## 2018-04-20 NOTE — BHH Suicide Risk Assessment (Signed)
Suicide Risk Assessment  Discharge Assessment   [facility] Discharge Suicide Risk Assessment   Principal Problem: Major depressive disorder, recurrent episode, mild ([place]) Discharge Diagnoses: Principal Problem:   Major depressive disorder, recurrent episode, mild ([place])   Total Time spent with patient: 45 minutes  Musculoskeletal: Strength & Muscle Tone: within normal limits Gait & Station: normal Patient leans: N/A  Psychiatric Specialty Exam:   Blood pressure 119/67, pulse 89, temperature 98.5 F (36.9 C), temperature source Oral, resp. rate 15, SpO2 100 %.There is no height or weight on file to calculate BMI.  General Appearance: Casual  Eye Contact::  Good  Speech:  Normal Rate409  Volume:  Normal  Mood:  Depressed, mild  Affect:  Congruent  Thought Process:  Coherent and Descriptions of Associations: Intact  Orientation:  Full (Time, Place, and Person)  Thought Content:  WDL and Logical  Suicidal Thoughts:  No  Homicidal Thoughts:  No  Memory:  Immediate;   Good Recent;   Good Remote;   Good  Judgement:  Good  Insight:  Good  Psychomotor Activity:  Normal  Concentration:  Good  Recall:  Good  Fund of Knowledge:Good  Language: Good  Akathisia:  No  Handed:  Right  AIMS (if indicated):     Assets:  Housing Leisure Time Physical Health Resilience Social Support  Sleep:     Cognition: WNL  ADL's:  Intact   Mental Status Per Nursing Assessment::   On Admission:   37 yo female who presented to the ED with passive suicidal ideations.  Her depression has increased since her Abilify was stopped two weeks ago.  Celexa worked well for her but her provider stopped and she does not know why.  No suicidal/homicidal ideations, hallucinations, or substance abuse.  STable for discharge.  Demographic Factors:  NA  Loss Factors: NA  Historical Factors: NA  Risk Reduction Factors:   Sense of responsibility to family, Living with another person, especially a relative and  Positive social support  Continued Clinical Symptoms:  Depression, mild  Cognitive Features That Contribute To Risk:  None    Suicide Risk:  Minimal: No identifiable suicidal ideation.  Patients presenting with no risk factors but with morbid ruminations; may be classified as minimal risk based on the severity of the depressive symptoms   Plan Of Care/Follow-up recommendations:  Activity:  as tolerated Diet:  heart healhty diet  Havier Deeb, NP 04/20/2018, 9:57 AM

## 2018-04-20 NOTE — ED Notes (Signed)
Bed: EEF00 Expected date:  Expected time:  Means of arrival:  Comments: Hold for triage 4

## 2018-04-20 NOTE — ED Notes (Signed)
Bed: [facility]PT4 Expected date:  Expected time:  Means of arrival:  Comments: 

## 2018-04-20 NOTE — ED Notes (Signed)
On admission to the Acute Unit pt is calm and cooperative. Affect blunted, mood depressed. She reports that she had a hysterectomy in 2009.

## 2018-07-28 ENCOUNTER — Ambulatory Visit (HOSPITAL_COMMUNITY): Payer: Medicare Other

## 2018-07-28 ENCOUNTER — Encounter (HOSPITAL_COMMUNITY): Payer: Self-pay | Admitting: Emergency Medicine

## 2018-07-28 ENCOUNTER — Other Ambulatory Visit: Payer: Self-pay

## 2018-07-28 ENCOUNTER — Ambulatory Visit (HOSPITAL_COMMUNITY)
Admission: EM | Admit: 2018-07-28 | Discharge: 2018-07-28 | Disposition: A | Discharge status: Home / Self Care | Payer: Medicare Other | Attending: Family Medicine | Admitting: Family Medicine

## 2018-07-28 DIAGNOSIS — L03012 Cellulitis of left finger: Secondary | ICD-10-CM | POA: Diagnosis not present

## 2018-07-28 MED ORDER — DOXYCYCLINE HYCLATE 100 MG PO CAPS: 100.0000 mg | ORAL_CAPSULE | Freq: Two times a day (BID) | ORAL | 0 refills | Status: AC

## 2018-07-28 NOTE — ED Provider Notes (Signed)
[place]    CSN: [id number] Arrival date & time: 07/28/18  1108     History   Chief Complaint Chief Complaint  Patient presents with  . Finger Injury    HPI [name] is a 38 y.o. female.   [name] presents with complaints of pain to left hand middle finger tip. States she feels she may have accidentally struck it against the door prior to onset. Felt the pain off and on for 2 weeks but over the past three days has been increased and constant. 10/10 pain to distal finger. No drainage. Had nails done approximately 2 weeks prior to pain starting. Anything touching the finger is painful. Has taken ibuprofen which minimally helps, last at 9 this am. Denies any previous similar. Pain to dorsal aspect of finger, at base of nail bed, but also to tuft of finger.   ROS per HPI, negative if not otherwise mentioned.      Past Medical History:  Diagnosis Date  . Anemia    h/o  . Anginal pain ([place])    pt. takes nitrostat- as needed, hasn't had since 04/2012, sees Dr. Terrence Dupont, last ekg was /w PCP- Avbere  . Anxiety   . Asthma   . Borderline personality disorder ([place])   . Chiari malformation   . Depression   . GERD (gastroesophageal reflux disease)   . Headache(784.0)   . Meningitis    Oct. 2013  . Neuromuscular disorder ([place])    muscle spasms-back & arms     Patient Active Problem List   Diagnosis Date Noted  . Major depressive disorder, recurrent episode, mild ([place]) 04/20/2018  . SVT (supraventricular tachycardia) ([place]) 01/22/2012  . E coli bacteremia 01/22/2012  . Sepsis(995.91) 01/20/2012  . UTI (lower urinary tract infection) 01/20/2012  . Arnold-Chiari malformation, type I ([place]) 01/20/2012  . Headache(784.0) 01/19/2012    Past Surgical History:  Procedure Laterality Date  . ABDOMINAL HYSTERECTOMY    . PERIPHERALLY INSERTED CENTRAL CATHETER INSERTION     during event of Meningitis- then d/c'd to home /w & treated /w antibiotics   .  SUBOCCIPITAL CRANIECTOMY CERVICAL LAMINECTOMY N/A 07/22/2012   Procedure: SUBOCCIPITAL CRANIECTOMY CERVICAL LAMINECTOMY/DURAPLASTY;  Surgeon: Hosie Spangle, MD;  Location: [facility];  Service: Neurosurgery;  Laterality: N/A;  Suboccipital craniectomy with upper cervial laminectomy with duroplasty   . TUBAL LIGATION    . VAGINAL DELIVERY     x3  . WISDOM TOOTH EXTRACTION      OB History   No obstetric history on file.      Home Medications    Prior to Admission medications   Medication Sig Start Date End Date Taking? Authorizing Provider  citalopram (CELEXA) 10 MG tablet Take 1 tablet (10 mg total) by mouth daily. 04/20/18  Yes Patrecia Pour, NP  ibuprofen (ADVIL,MOTRIN) 800 MG tablet Take 800 mg by mouth 2 (two) times daily as needed for moderate pain.  04/05/17  Yes [provider]  doxycycline (VIBRAMYCIN) 100 MG capsule Take 1 capsule (100 mg total) by mouth 2 (two) times daily for 10 days. 07/28/18 08/07/18  Zigmund Gottron, NP    Family History Family History  Problem Relation Age of Onset  . Heart attack Mother     Social History Social History   Tobacco Use  . Smoking status: Never Smoker  . Smokeless tobacco: Never Used  Substance Use Topics  . Alcohol use: No  . Drug use: No  Allergies   Amoxicillin; Imitrex [sumatriptan base]; Penicillins; Tramadol; Norco [hydrocodone-acetaminophen]; Percocet [oxycodone-acetaminophen]; and Soy allergy   Review of Systems Review of Systems   Physical Exam Triage Vital Signs ED Triage Vitals [07/28/18 1140]  Enc Vitals Group     BP 110/72     Pulse Rate 78     Resp 18     Temp 98.4 F (36.9 C)     Temp Source Oral     SpO2 98 %     Weight      Height      Head Circumference      Peak Flow      Pain Score 10     Pain Loc      Pain Edu?      Excl. in [place]?    No data found.  Updated Vital Signs BP 110/72 (BP Location: Right Arm)   Pulse 78   Temp 98.4 F (36.9 C) (Oral)   Resp 18    SpO2 98%   Visual Acuity Right Eye Distance:   Left Eye Distance:   Bilateral Distance:    Right Eye Near:   Left Eye Near:    Bilateral Near:     Physical Exam Constitutional:      General: She is not in acute distress.    Appearance: She is well-developed.  Cardiovascular:     Rate and Rhythm: Normal rate and regular rhythm.     Heart sounds: Normal heart sounds.  Pulmonary:     Effort: Pulmonary effort is normal.     Breath sounds: Normal breath sounds.  Musculoskeletal:     Left hand: She exhibits tenderness and swelling. She exhibits no bony tenderness, no deformity and no laceration. Normal sensation noted. Normal strength noted.     Comments: Redness and swelling to distal end of left hand middle finger; accumulation of white purulence visible at base of nail bed; redness and tenderness to tuft of finger, still with some softness; nail bed intact with superficial nails in place; no joint involvement  Skin:    General: Skin is warm and dry.  Neurological:     Mental Status: She is alert and oriented to person, place, and time.      UC Treatments / Results  Labs (all labs ordered are listed, but only abnormal results are displayed) Labs Reviewed - No data to display  EKG None  Radiology No results found.  Procedures Incision and Drainage Date/Time: 07/28/2018 12:22 PM Performed by: Zigmund Gottron, NP Authorized by: Robyn Haber, MD   Consent:    Consent obtained:  Verbal   Consent given by:  Patient   Risks discussed:  Bleeding, incomplete drainage, pain and infection   Alternatives discussed:  Observation, referral, delayed treatment and no treatment Location:    Type:  Abscess (paronychia )   Location:  Upper extremity   Upper extremity location:  Finger   Finger location:  L long finger Pre-procedure details:    Procedure prep: alcohol  Anesthesia (see MAR for exact dosages):    Anesthesia method:  Topical application   Topical anesthesia:  freeze spray  Procedure type:    Complexity:  Simple Procedure details:    Incision types:  Stab incision   Incision depth:  Dermal   Scalpel blade:  11   Drainage:  Bloody and purulent   Drainage amount:  Moderate   Wound treatment:  Wound left open   Packing materials:  None Post-procedure details:  Patient tolerance of procedure:  Tolerated well, no immediate complications   (including critical care time)  Medications Ordered in UC Medications - No data to display  Initial Impression / Assessment and Plan / UC Course  I have reviewed the triage vital signs and the nursing notes.  Pertinent labs & imaging results that were available during my care of the patient were reviewed by me and considered in my medical decision making (see chart for details).     Successful incision and drainage of paronychia; still with tuft pain and swelling, there is some concern for felon development. Afebrile. Still mildly soft. Strict return precautions discussed. Patient verbalized understanding and agreeable to plan.    Final Clinical Impressions(s) / UC Diagnoses   Final diagnoses:  Paronychia of finger, left     Discharge Instructions     Warm soaks 3-4 times a day to promote further drainage.  Complete course of antibiotics.  Tylenol and/or ibuprofen as needed for pain or fevers.   If no improvement or if worsening in the next 24-48 hours please go to the ER as you may need further intervention.    ED Prescriptions    Medication Sig Dispense Auth. Provider   doxycycline (VIBRAMYCIN) 100 MG capsule Take 1 capsule (100 mg total) by mouth 2 (two) times daily for 10 days. 20 capsule Zigmund Gottron, NP     Controlled Substance Prescriptions [place] Controlled Substance Registry consulted? Not Applicable   Zigmund Gottron, NP 07/28/18 1224

## 2018-07-28 NOTE — ED Triage Notes (Signed)
The patient presented to the [facility] with a complaint of pain and swelling to the middle finger of her left hand that started 2 weeks ago after striking it against a door.

## 2018-07-28 NOTE — Discharge Instructions (Signed)
Warm soaks 3-4 times a day to promote further drainage.  Complete course of antibiotics.  Tylenol and/or ibuprofen as needed for pain or fevers.   If no improvement or if worsening in the next 24-48 hours please go to the ER as you may need further intervention.

## 2018-11-04 ENCOUNTER — Encounter (HOSPITAL_COMMUNITY): Payer: Self-pay

## 2018-11-04 ENCOUNTER — Ambulatory Visit (HOSPITAL_COMMUNITY)
Admission: EM | Admit: 2018-11-04 | Discharge: 2018-11-04 | Disposition: A | Discharge status: Home / Self Care | Payer: Medicare Other | Attending: Emergency Medicine | Admitting: Emergency Medicine

## 2018-11-04 ENCOUNTER — Other Ambulatory Visit: Payer: Self-pay

## 2018-11-04 ENCOUNTER — Ambulatory Visit (INDEPENDENT_AMBULATORY_CARE_PROVIDER_SITE_OTHER): Payer: Medicare Other

## 2018-11-04 ENCOUNTER — Telehealth: Payer: Self-pay | Admitting: *Deleted

## 2018-11-04 DIAGNOSIS — R05 Cough: Secondary | ICD-10-CM | POA: Diagnosis present

## 2018-11-04 DIAGNOSIS — R6889 Other general symptoms and signs: Secondary | ICD-10-CM

## 2018-11-04 DIAGNOSIS — Z20828 Contact with and (suspected) exposure to other viral communicable diseases: Secondary | ICD-10-CM | POA: Diagnosis not present

## 2018-11-04 DIAGNOSIS — R058 Other specified cough: Secondary | ICD-10-CM

## 2018-11-04 DIAGNOSIS — J029 Acute pharyngitis, unspecified: Secondary | ICD-10-CM | POA: Insufficient documentation

## 2018-11-04 DIAGNOSIS — Z20822 Contact with and (suspected) exposure to covid-19: Secondary | ICD-10-CM

## 2018-11-04 LAB — POCT RAPID STREP A: Streptococcus, Group A Screen (Direct): NEGATIVE

## 2018-11-04 NOTE — ED Provider Notes (Signed)
[place]    CSN: [id number] Arrival date & time: 11/04/18  0825      History   Chief Complaint Chief Complaint  Patient presents with  . Cough  . Sore Throat    HPI [name] is a 38 y.o. female.   Patient presents with sore throat x3 days and cough productive of brown, bloody sputum x2 days.  No history of smoking.  She denies fever, chills, vomiting, diarrhea, shortness of breath, chest pain. LMP: hysterectomy.   The history is provided by the patient.  Sore Throat Pertinent negatives include no chest pain, no abdominal pain and no shortness of breath.    Past Medical History:  Diagnosis Date  . Anemia    h/o  . Anginal pain ([place])    pt. takes nitrostat- as needed, hasn't had since 04/2012, sees Dr. Terrence Dupont, last ekg was /w PCP- [facility]bere  . Anxiety   . Asthma   . Borderline personality disorder ([place])   . Chiari malformation   . Depression   . GERD (gastroesophageal reflux disease)   . Headache(784.0)   . Meningitis    Oct. 2013  . Neuromuscular disorder ([place])    muscle spasms-back & arms     Patient Active Problem List   Diagnosis Date Noted  . Major depressive disorder, recurrent episode, mild ([place]) 04/20/2018  . SVT (supraventricular tachycardia) ([place]) 01/22/2012  . E coli bacteremia 01/22/2012  . Sepsis(995.91) 01/20/2012  . UTI (lower urinary tract infection) 01/20/2012  . Arnold-Chiari malformation, type I ([place]) 01/20/2012  . Headache(784.0) 01/19/2012    Past Surgical History:  Procedure Laterality Date  . ABDOMINAL HYSTERECTOMY    . PERIPHERALLY INSERTED CENTRAL CATHETER INSERTION     during event of Meningitis- then d/c'd to home /w & treated /w antibiotics   . SUBOCCIPITAL CRANIECTOMY CERVICAL LAMINECTOMY N/A 07/22/2012   Procedure: SUBOCCIPITAL CRANIECTOMY CERVICAL LAMINECTOMY/DURAPLASTY;  Surgeon: Hosie Spangle, MD;  Location: [facility];  Service: Neurosurgery;  Laterality: N/A;  Suboccipital craniectomy with upper  cervial laminectomy with duroplasty   . TUBAL LIGATION    . VAGINAL DELIVERY     x3  . WISDOM TOOTH EXTRACTION      OB History   No obstetric history on file.      Home Medications    Prior to Admission medications   Medication Sig Start Date End Date Taking? Authorizing Provider  citalopram (CELEXA) 10 MG tablet Take 1 tablet (10 mg total) by mouth daily. 04/20/18  Yes Patrecia Pour, NP  ibuprofen (ADVIL,MOTRIN) 800 MG tablet Take 800 mg by mouth 2 (two) times daily as needed for moderate pain.  04/05/17  Yes [provider]    Family History Family History  Problem Relation Age of Onset  . Heart attack Mother   . Healthy Father     Social History Social History   Tobacco Use  . Smoking status: Never Smoker  . Smokeless tobacco: Never Used  Substance Use Topics  . Alcohol use: No  . Drug use: No     Allergies   Amoxicillin, Imitrex [sumatriptan base], Penicillins, Tramadol, Norco [hydrocodone-acetaminophen], Percocet [oxycodone-acetaminophen], and Soy allergy   Review of Systems Review of Systems  Constitutional: Negative for chills and fever.  HENT: Positive for sore throat. Negative for ear pain and trouble swallowing.   Eyes: Negative for pain and visual disturbance.  Respiratory: Positive for cough. Negative for shortness of breath.   Cardiovascular: Negative for chest pain and palpitations.  Gastrointestinal: Negative for abdominal pain and vomiting.  Genitourinary: Negative for dysuria and hematuria.  Musculoskeletal: Negative for arthralgias and back pain.  Skin: Negative for color change and rash.  Neurological: Negative for seizures and syncope.  All other systems reviewed and are negative.    Physical Exam Triage Vital Signs ED Triage Vitals  Enc Vitals Group     BP 11/04/18 0843 (!) 111/59     Pulse Rate 11/04/18 0843 76     Resp 11/04/18 0843 17     Temp 11/04/18 0843 98.2 F (36.8 C)     Temp Source 11/04/18 0843 Oral      SpO2 11/04/18 0843 98 %     Weight --      Height --      Head Circumference --      Peak Flow --      Pain Score 11/04/18 0841 2     Pain Loc --      Pain Edu? --      Excl. in [place]? --    No data found.  Updated Vital Signs BP (!) 111/59 (BP Location: Left Arm)   Pulse 76   Temp 98.2 F (36.8 C) (Oral)   Resp 17   SpO2 98%   Visual Acuity Right Eye Distance:   Left Eye Distance:   Bilateral Distance:    Right Eye Near:   Left Eye Near:    Bilateral Near:     Physical Exam Vitals signs and nursing note reviewed.  Constitutional:      General: She is not in acute distress.    Appearance: She is well-developed.  HENT:     Head: Normocephalic and atraumatic.     Right Ear: Tympanic membrane normal.     Left Ear: Tympanic membrane normal.     Mouth/Throat:     Mouth: Mucous membranes are moist.     Pharynx: Uvula midline. Posterior oropharyngeal erythema present. No pharyngeal swelling or oropharyngeal exudate.  Eyes:     Conjunctiva/sclera: Conjunctivae normal.  Neck:     Musculoskeletal: Neck supple.  Cardiovascular:     Rate and Rhythm: Normal rate and regular rhythm.  Pulmonary:     Effort: Pulmonary effort is normal. No respiratory distress.     Breath sounds: Normal breath sounds.  Abdominal:     Palpations: Abdomen is soft.     Tenderness: There is no abdominal tenderness. There is no right CVA tenderness, left CVA tenderness, guarding or rebound.  Skin:    General: Skin is warm and dry.  Neurological:     Mental Status: She is alert.      UC Treatments / Results  Labs (all labs ordered are listed, but only abnormal results are displayed) Labs Reviewed - No data to display  EKG   Radiology No results found.  Procedures Procedures (including critical care time)  Medications Ordered in UC Medications - No data to display  Initial Impression / Assessment and Plan / UC Course  I have reviewed the triage vital signs and the nursing notes.   Pertinent labs & imaging results that were available during my care of the patient were reviewed by me and considered in my medical decision making (see chart for details).   Suspected COVID, acute pharyngitis, productive cough.  Chest x-ray negative.  Rapid strep negative; culture pending.  COVID test ordered.  Discussed with patient that she should self quarantine until her test results have returned negative.  Discussed that she should  return here or go to the emergency department if she develops fever, chills, vomiting, diarrhea, shortness of breath, chest pain, or other concerning symptoms.    Final Clinical Impressions(s) / UC Diagnoses   Final diagnoses:  Productive cough   Discharge Instructions   None    ED Prescriptions    None     Controlled Substance Prescriptions [place] Controlled Substance Registry consulted? Not Applicable   Sharion Balloon, NP 11/04/18 1013

## 2018-11-04 NOTE — ED Triage Notes (Signed)
Patient presents to Urgent Care with complaints of cough and sore throat since 3 days ago. Patient reports she began coughing up small amounts of dark brown mucous yesterday. Pt denies fevers or chills.

## 2018-11-04 NOTE — Telephone Encounter (Signed)
-----   Message from Sharion Balloon, NP sent at 11/04/2018 10:06 AM EDT ----- Regarding: need COVID test

## 2018-11-04 NOTE — Telephone Encounter (Signed)
Scheduled patient for COVID 19 test today at [facility].  Testing protocol reviewed.

## 2018-11-04 NOTE — Discharge Instructions (Addendum)
Your COVID test has been ordered.  You should receive a phone call in the next few minutes to schedule this test today.  You should self quarantine until you receive negative test results.  Your chest x-ray was normal today.  Your rapid strep test was negative; culture has been sent and if you need treatment, we will call you.  Return here or go to the emergency department if you develop fever, chills, vomiting, diarrhea, shortness of breath, chest pain, other concerning symptoms.

## 2018-11-06 LAB — CULTURE, GROUP A STREP (THRC)

## 2018-11-08 LAB — NOVEL CORONAVIRUS, NAA: SARS-CoV-2, NAA: NOT DETECTED

## 2018-11-12 ENCOUNTER — Encounter (HOSPITAL_COMMUNITY): Payer: Self-pay

## 2018-12-12 ENCOUNTER — Other Ambulatory Visit: Payer: Self-pay

## 2018-12-12 ENCOUNTER — Emergency Department (HOSPITAL_COMMUNITY)
Admission: EM | Admit: 2018-12-12 | Discharge: 2018-12-13 | Disposition: A | Discharge status: Other Acute Inpatient Hospital | Payer: Medicare Other | Attending: Emergency Medicine | Admitting: Emergency Medicine

## 2018-12-12 ENCOUNTER — Encounter (HOSPITAL_COMMUNITY): Payer: Self-pay

## 2018-12-12 DIAGNOSIS — Z79899 Other long term (current) drug therapy: Secondary | ICD-10-CM | POA: Insufficient documentation

## 2018-12-12 DIAGNOSIS — R45851 Suicidal ideations: Secondary | ICD-10-CM | POA: Insufficient documentation

## 2018-12-12 DIAGNOSIS — Z20828 Contact with and (suspected) exposure to other viral communicable diseases: Secondary | ICD-10-CM | POA: Insufficient documentation

## 2018-12-12 DIAGNOSIS — F329 Major depressive disorder, single episode, unspecified: Secondary | ICD-10-CM | POA: Diagnosis not present

## 2018-12-12 DIAGNOSIS — F32A Depression, unspecified: Secondary | ICD-10-CM

## 2018-12-12 DIAGNOSIS — Z046 Encounter for general psychiatric examination, requested by authority: Secondary | ICD-10-CM | POA: Diagnosis present

## 2018-12-12 LAB — COMPREHENSIVE METABOLIC PANEL
ALT: 11 U/L (ref 0–44)
AST: 19 U/L (ref 15–41)
Albumin: 3.9 g/dL (ref 3.5–5.0)
Alkaline Phosphatase: 71 U/L (ref 38–126)
Anion gap: 12 (ref 5–15)
BUN: 8 mg/dL (ref 6–20)
CO2: 21 mmol/L — ABNORMAL LOW (ref 22–32)
Calcium: 9.8 mg/dL (ref 8.9–10.3)
Chloride: 105 mmol/L (ref 98–111)
Creatinine, Ser: 0.66 mg/dL (ref 0.44–1.00)
GFR calc Af Amer: >60 mL/min (ref >60)
GFR calc non Af Amer: >60 mL/min (ref >60)
Glucose, Bld: 137 mg/dL — ABNORMAL HIGH (ref 70–99)
Potassium: 3.7 mmol/L (ref 3.5–5.1)
Sodium: 138 mmol/L (ref 135–145)
Total Bilirubin: 0.7 mg/dL (ref 0.3–1.2)
Total Protein: 7.6 g/dL (ref 6.5–8.1)

## 2018-12-12 LAB — CBC
HCT: 39 % (ref 36.0–46.0)
Hemoglobin: 12.7 g/dL (ref 12.0–15.0)
MCH: 29.5 pg (ref 26.0–34.0)
MCHC: 32.6 g/dL (ref 30.0–36.0)
MCV: 90.7 fL (ref 80.0–100.0)
Platelets: 325 10*3/uL (ref 150–400)
RBC: 4.3 MIL/uL (ref 3.87–5.11)
RDW: 12.9 % (ref 11.5–15.5)
WBC: 5.6 10*3/uL (ref 4.0–10.5)
nRBC: 0 % (ref 0.0–0.2)

## 2018-12-12 LAB — I-STAT BETA HCG BLOOD, ED (MC, WL, AP ONLY): I-stat hCG, quantitative: <5 m[IU]/mL (ref <5)

## 2018-12-12 LAB — RAPID URINE DRUG SCREEN, HOSP PERFORMED
Amphetamines: NOT DETECTED
Barbiturates: NOT DETECTED
Benzodiazepines: NOT DETECTED
Cocaine: NOT DETECTED
Opiates: NOT DETECTED
Tetrahydrocannabinol: NOT DETECTED

## 2018-12-12 LAB — SALICYLATE LEVEL: Salicylate Lvl: <7 mg/dL (ref 2.8–30.0)

## 2018-12-12 LAB — ETHANOL: Alcohol, Ethyl (B): <10 mg/dL (ref <10)

## 2018-12-12 LAB — ACETAMINOPHEN LEVEL: Acetaminophen (Tylenol), Serum: <10 ug/mL — ABNORMAL LOW (ref 10–30)

## 2018-12-12 LAB — SARS CORONAVIRUS 2 BY RT PCR (HOSPITAL ORDER, PERFORMED IN ~~LOC~~ HOSPITAL LAB): SARS Coronavirus 2: NEGATIVE

## 2018-12-12 MED ORDER — CITALOPRAM HYDROBROMIDE 10 MG PO TABS
10.0000 mg | ORAL_TABLET | Freq: Every day | ORAL | Status: DC
  Administered 2018-12-12 – 2018-12-13 (×2): 10 mg via ORAL
  Filled 2018-12-12 (×2): qty 1

## 2018-12-12 MED ORDER — OLANZAPINE 5 MG PO TABS
5.0000 mg | ORAL_TABLET | Freq: Every day | ORAL | Status: DC
  Administered 2018-12-12: 5 mg via ORAL
  Filled 2018-12-12: qty 1

## 2018-12-12 MED ORDER — IBUPROFEN 800 MG PO TABS: 800.0000 mg | ORAL_TABLET | Freq: Two times a day (BID) | ORAL | Status: DC | PRN

## 2018-12-12 NOTE — ED Notes (Signed)
Pt valuables placed in purple locker #2. Valuables secured with security.

## 2018-12-12 NOTE — ED Provider Notes (Signed)
[facility] EMERGENCY DEPARTMENT Provider Note   CSN: [id number] Arrival date & time: 12/12/18  1549     History   Chief Complaint Chief Complaint  Patient presents with  . Psychiatric Evaluation  . Manic Behavior    HPI [name] is a 38 y.o. female.     Patient is a 38 year old female with a history of depression, bipolar disease who presents today under IVC for suicidal ideation.  Patient states she has been taking her medication as prescribed but recently she has had a lot more stress and there is been a lot going on.  She does talk to her counselor weekly and talk with her today but there was a disagreement with another family member and she got angry and left.  She then attempted to jump off a bridge but was stopped by bystanders.  She says she does feel better now after talking to her grandmother and the sitter who is been accompanying her.  However she still feels down and stressed.  She denies alcohol tobacco or drug use.  She has no chest pain, shortness of breath or other complaints at this time.  The history is provided by the patient.    Past Medical History:  Diagnosis Date  . Anemia    h/o  . Anginal pain ([place])    pt. takes nitrostat- as needed, hasn't had since 04/2012, sees Dr. Terrence Dupont, last ekg was /w PCP- Avbere  . Anxiety   . Asthma   . Borderline personality disorder ([place])   . Chiari malformation   . Depression   . GERD (gastroesophageal reflux disease)   . Headache(784.0)   . Meningitis    Oct. 2013  . Neuromuscular disorder ([place])    muscle spasms-back & arms     Patient Active Problem List   Diagnosis Date Noted  . Major depressive disorder, recurrent episode, mild ([place]) 04/20/2018  . SVT (supraventricular tachycardia) ([place]) 01/22/2012  . E coli bacteremia 01/22/2012  . Sepsis(995.91) 01/20/2012  . UTI (lower urinary tract infection) 01/20/2012  . Arnold-Chiari malformation, type I ([place]) 01/20/2012  .  Headache(784.0) 01/19/2012    Past Surgical History:  Procedure Laterality Date  . ABDOMINAL HYSTERECTOMY    . PERIPHERALLY INSERTED CENTRAL CATHETER INSERTION     during event of Meningitis- then d/c'd to home /w & treated /w antibiotics   . SUBOCCIPITAL CRANIECTOMY CERVICAL LAMINECTOMY N/A 07/22/2012   Procedure: SUBOCCIPITAL CRANIECTOMY CERVICAL LAMINECTOMY/DURAPLASTY;  Surgeon: Hosie Spangle, MD;  Location: [facility];  Service: Neurosurgery;  Laterality: N/A;  Suboccipital craniectomy with upper cervial laminectomy with duroplasty   . TUBAL LIGATION    . VAGINAL DELIVERY     x3  . WISDOM TOOTH EXTRACTION       OB History   No obstetric history on file.      Home Medications    Prior to Admission medications   Medication Sig Start Date End Date Taking? Authorizing Provider  citalopram (CELEXA) 10 MG tablet Take 1 tablet (10 mg total) by mouth daily. 04/20/18   Patrecia Pour, NP  ibuprofen (ADVIL,MOTRIN) 800 MG tablet Take 800 mg by mouth 2 (two) times daily as needed for moderate pain.  04/05/17   [provider]    Family History Family History  Problem Relation Age of Onset  . Heart attack Mother   . Healthy Father     Social History Social History   Tobacco Use  . Smoking status: Never Smoker  .  Smokeless tobacco: Never Used  Substance Use Topics  . Alcohol use: No  . Drug use: No     Allergies   Amoxicillin, Imitrex [sumatriptan base], Penicillins, Tramadol, Norco [hydrocodone-acetaminophen], Percocet [oxycodone-acetaminophen], and Soy allergy   Review of Systems Review of Systems  All other systems reviewed and are negative.    Physical Exam Updated Vital Signs BP 118/60 (BP Location: Left Arm)   Pulse (!) 111   Temp 99.5 F (37.5 C) (Oral)   Resp 18   Ht 5\' 4"  (1.626 m)   Wt 80.3 kg   SpO2 96%   BMI 30.38 kg/m   Physical Exam Vitals signs and nursing note reviewed.  Constitutional:      General: She is not in acute  distress.    Appearance: She is well-developed and normal weight.  HENT:     Head: Normocephalic and atraumatic.  Eyes:     Pupils: Pupils are equal, round, and reactive to light.  Cardiovascular:     Rate and Rhythm: Normal rate and regular rhythm.     Pulses: Normal pulses.     Heart sounds: Normal heart sounds. No murmur. No friction rub.  Pulmonary:     Effort: Pulmonary effort is normal.     Breath sounds: Normal breath sounds. No wheezing or rales.  Abdominal:     General: Bowel sounds are normal. There is no distension.     Palpations: Abdomen is soft.     Tenderness: There is no abdominal tenderness. There is no guarding or rebound.  Musculoskeletal: Normal range of motion.        General: No tenderness.     Comments: No edema  Skin:    General: Skin is warm and dry.     Findings: No rash.  Neurological:     General: No focal deficit present.     Mental Status: She is alert and oriented to person, place, and time. Mental status is at baseline.     Cranial Nerves: No cranial nerve deficit.  Psychiatric:        Mood and Affect: Mood normal.        Behavior: Behavior is cooperative.        Thought Content: Thought content includes suicidal ideation. Thought content includes suicidal plan.      ED Treatments / Results  Labs (all labs ordered are listed, but only abnormal results are displayed) Labs Reviewed  COMPREHENSIVE METABOLIC PANEL - Abnormal; Notable for the following components:      Result Value   CO2 21 (*)    Glucose, Bld 137 (*)    All other components within normal limits  ACETAMINOPHEN LEVEL - Abnormal; Notable for the following components:   Acetaminophen (Tylenol), Serum <10 (*)    All other components within normal limits  ETHANOL  SALICYLATE LEVEL  CBC  RAPID URINE DRUG SCREEN, HOSP PERFORMED  I-STAT BETA HCG BLOOD, ED ([facility], [facility], [facility] ONLY)    EKG EKG Interpretation  Date/Time:  Thursday December 12 2018 21:32:07 EDT Ventricular Rate:  80 PR  Interval:    QRS Duration: 80 QT Interval:  365 QTC Calculation: 421 R Axis:   4 Text Interpretation:  Sinus rhythm Probable left atrial enlargement Low voltage, precordial leads Borderline T wave abnormalities No significant change since last tracing Confirmed by Blanchie Dessert [phone]) on 12/12/2018 9:40:31 PM   Radiology No results found.  Procedures Procedures (including critical care time)  Medications Ordered in ED Medications  OLANZapine (ZYPREXA) tablet 5  mg (has no administration in time range)     Initial Impression / Assessment and Plan / ED Course  I have reviewed the triage vital signs and the nursing notes.  Pertinent labs & imaging results that were available during my care of the patient were reviewed by me and considered in my medical decision making (see chart for details).        Patient presenting today after suicidal ideation where she tried to jump off a bridge.  Patient has been struggling with a lot of stress, depression and bipolar disease that is worsened recently.  She is calm and cooperative at this time.  Labs within normal limits at this time.  Patient has been accepted to [facility].  EKG without acute changes.  Patient is been calm and cooperative the entire time here.  She is currently medically clear at this time  Final Clinical Impressions(s) / ED Diagnoses   Final diagnoses:  Suicidal ideation  Depression, unspecified depression type    ED Discharge Orders    None       Blanchie Dessert, MD 12/12/18 2142

## 2018-12-12 NOTE — BH Assessment (Signed)
Per Marcille Blanco at [facility] the patient has been accepted and can come tonight or tomorrow after 9am   The MD accepting is Elaina Hoops.  The number to give report [phone].  The patient will come to [facility]

## 2018-12-12 NOTE — ED Notes (Signed)
ED Provider at bedside. 

## 2018-12-12 NOTE — Discharge Instructions (Signed)
Accepting MD is Herbalist

## 2018-12-12 NOTE — ED Triage Notes (Signed)
Pt arrives by [employer] IVC for suicide attempt. [employer] reports that the 38 y/o daughter called 79 today stating that mother was going to kill herself. PD searched the area and found the patient w/ one leg over the railing of a bridge trying to climb over and jump. Pt was physically pulled away from railing and brought in as IVC w/ [employer]. Pt endorses feeling angry and depressed lately. Endorses longstanding hx of SI

## 2018-12-12 NOTE — BH Assessment (Signed)
Writer informed the ER MD and the RN that the patient has been accepted.l

## 2018-12-12 NOTE — BH Assessment (Signed)
Writer referred the patient to the following facilities:  Under Review:  [facility]
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
## 2018-12-12 NOTE — ED Notes (Signed)
[name](Daughter# [phone]) called for an update.

## 2018-12-12 NOTE — ED Notes (Signed)
Pt making a phone call at the nurses station to her daughter

## 2018-12-13 DIAGNOSIS — F329 Major depressive disorder, single episode, unspecified: Secondary | ICD-10-CM | POA: Diagnosis not present

## 2018-12-13 NOTE — ED Provider Notes (Signed)
Patient alert, content, no distress. Pt ambulating in [place], steady gait, to shower.   Pt accepted at [facility].   Vitals:   12/12/18 2204 12/13/18 0557  BP: 113/67 102/68  Pulse: 83 95  Resp: 16 16  Temp: 97.8 F (36.6 C) 97.6 F (36.4 C)  SpO2: 100% 100%   Patient appears stable for transfer.      Lajean Saver, MD 12/13/18 1020

## 2018-12-13 NOTE — ED Notes (Signed)
Patient was given a Media planner and Drink.

## 2018-12-13 NOTE — ED Provider Notes (Signed)
Rounded on patient this morning.   Patient has been recommended for inpatient treatment is waiting placement.  My evaluation, she is sleeping, resting comfortably in bed with no signs of acute distress.  Portions of this note were generated with [employer]. Dictation errors may occur despite best attempts at proofreading.    Volanda Napoleon, PA-C 12/13/18 1503    Maudie Flakes, MD 12/16/18 1101

## 2018-12-13 NOTE — ED Notes (Signed)
Breakfast Tray ordered  

## 2019-03-21 ENCOUNTER — Ambulatory Visit (HOSPITAL_COMMUNITY)
Admission: EM | Admit: 2019-03-21 | Discharge: 2019-03-21 | Disposition: A | Discharge status: Home / Self Care | Payer: Medicare Other | Attending: Family Medicine | Admitting: Family Medicine

## 2019-03-21 ENCOUNTER — Other Ambulatory Visit: Payer: Self-pay

## 2019-03-21 ENCOUNTER — Encounter (HOSPITAL_COMMUNITY): Payer: Self-pay

## 2019-03-21 DIAGNOSIS — N898 Other specified noninflammatory disorders of vagina: Secondary | ICD-10-CM | POA: Insufficient documentation

## 2019-03-21 DIAGNOSIS — R300 Dysuria: Secondary | ICD-10-CM

## 2019-03-21 LAB — POCT URINALYSIS DIP (DEVICE)
Bilirubin Urine: NEGATIVE
Glucose, UA: NEGATIVE mg/dL
Hgb urine dipstick: NEGATIVE
Ketones, ur: NEGATIVE mg/dL
Leukocytes,Ua: NEGATIVE
Nitrite: NEGATIVE
Protein, ur: NEGATIVE mg/dL
Specific Gravity, Urine: 1.02 (ref 1.005–1.030)
Urobilinogen, UA: 0.2 mg/dL (ref 0.0–1.0)
pH: 7 (ref 5.0–8.0)

## 2019-03-21 MED ORDER — METRONIDAZOLE 500 MG PO TABS: 500.0000 mg | ORAL_TABLET | Freq: Two times a day (BID) | ORAL | 0 refills | Status: DC

## 2019-03-21 NOTE — ED Triage Notes (Signed)
Pt presents to UC w/ c/o lower abd pain, lower back pain, and burning on urination since 3 days. Pr states she used a loufa to wash her genital area 3 days ago. Pt states she has thick white vaginal discharge.

## 2019-03-21 NOTE — ED Provider Notes (Signed)
[name]    CSN: [id number] Arrival date & time: 03/21/19  [id number]      History   Chief Complaint Chief Complaint  Patient presents with  . lower back, lower abd pain    HPI KESLYN Brown is a 38 y.o. female.    HPI Complains of 3 days of dysuria with associated back pain and lower abdominal pressure. Recent cleansed genital area with loufa contributes this activity to the onset of symptoms. Denies fever, chills, N&V.  Past Medical History:  Diagnosis Date  . Anemia    h/o  . Anginal pain ([place])    pt. takes nitrostat- as needed, hasn't had since 04/2012, sees Dr. Terrence Dupont, last ekg was /w PCP- [facility]  . Anxiety   . Asthma   . Borderline personality disorder ([place])   . Chiari malformation   . Depression   . GERD (gastroesophageal reflux disease)   . Headache(784.0)   . Meningitis    Oct. 2013  . Neuromuscular disorder ([place])    muscle spasms-back & arms     Patient Active Problem List   Diagnosis Date Noted  . 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 SVT (supraventricular tachycardia) ([place]) 01/22/2012  . E coli bacteremia 01/22/2012  . Sepsis(995.91) 01/20/2012  . UTI (lower urinary tract infection) 01/20/2012  . 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 Headache(784.0) 01/19/2012    Past Surgical History:  Procedure Laterality Date  . ABDOMINAL HYSTERECTOMY    . PERIPHERALLY INSERTED CENTRAL CATHETER INSERTION     during event of Meningitis- then d/c'd to home /w & treated /w antibiotics   . SUBOCCIPITAL CRANIECTOMY CERVICAL LAMINECTOMY N/A 07/22/2012   Procedure: SUBOCCIPITAL CRANIECTOMY CERVICAL LAMINECTOMY/DURAPLASTY;  Surgeon: Hosie Spangle, MD;  Location: [place] [facility];  Service: Neurosurgery;  Laterality: N/A;  Suboccipital craniectomy with upper cervial laminectomy with duroplasty   . TUBAL LIGATION    . VAGINAL DELIVERY     x3  . WISDOM TOOTH EXTRACTION      OB History   No obstetric  history on file.      Home Medications    Prior to Admission medications   Medication Sig Start Date End Date Taking? Authorizing Provider  benzocaine-resorcinol (VAGISIL) 5-2 % vaginal cream Place vaginally at bedtime.   Yes [provider]  citalopram (CELEXA) 10 MG tablet Take 1 tablet (10 mg total) by mouth daily. Patient taking differently: Take 20 mg by mouth at bedtime.  04/20/18   Patrecia Pour, NP  ibuprofen (ADVIL,MOTRIN) 800 MG tablet Take 800 mg by mouth 2 (two) times daily as needed for moderate pain.  04/05/17   [provider]    Family History Family History  Problem Relation Age of Onset  . Heart attack Mother   . Healthy Father     Social History Social History   Tobacco Use  . Smoking status: Never Smoker  . Smokeless tobacco: Never Used  Substance Use Topics  . Alcohol use: No  . Drug use: No     Allergies   Amoxicillin, Imitrex [sumatriptan base], Penicillins, Tramadol, Norco [hydrocodone-acetaminophen], Percocet [oxycodone-acetaminophen], and Soy allergy   Review of Systems Review of Systems Pertinent negatives listed in HPI  Physical Exam Triage Vital Signs ED Triage Vitals  Enc Vitals Group     BP 03/21/19 0837 116/71     Pulse Rate 03/21/19 0837 66     Resp 03/21/19 0837 16     Temp 03/21/19 0837  98.6 F (37 C)     Temp Source 03/21/19 0837 Oral     SpO2 03/21/19 0837 100 %     Weight --      Height --      Head Circumference --      Peak Flow --      Pain Score 03/21/19 0840 5     Pain Loc --      Pain Edu? --      Excl. in [place]? --    No data found.  Updated Vital Signs BP 116/71 (BP Location: Right Arm)   Pulse 66   Temp 98.6 F (37 C) (Oral)   Resp 16   SpO2 100%   Visual Acuity Right Eye Distance:   Left Eye Distance:   Bilateral Distance:    Right Eye Near:   Left Eye Near:    Bilateral Near:     Physical Exam General appearance: alert, well developed, well nourished, cooperative and in  no distress Head: Normocephalic, without obvious abnormality, atraumatic Respiratory: Respirations even and unlabored, normal respiratory rate Heart: rate and rhythm normal. No gallop or murmurs noted on exam  Abdomen: BS +, no distention, no rebound tenderness, or no mass Extremities: No gross deformities Skin: Skin color, texture, turgor normal. No rashes seen  Psych: Appropriate mood and affect. Neurologic: Mental status: Alert, oriented to person, place, and time, thought content appropriate.  UC Treatments / Results  Labs (all labs ordered are listed, but only abnormal results are displayed) Labs Reviewed - No data to display  EKG   Radiology No results found.  Procedures Procedures (including critical care time)  Medications Ordered in UC Medications - No data to display  Initial Impression / Assessment and Plan / UC Course  I have reviewed the triage vital signs and the nursing notes.  Pertinent labs & imaging results that were available during my care of the patient were reviewed by me and considered in my medical decision making (see chart for details).   Vaginal discharge with symptoms consistent with Bacterial Vaginosis. Pt has a history of BV. Vaginal specimen collected pending results. Metronidazole prescribed. Pt verbalized understanding and agreement with plan.  Final Clinical Impressions(s) / UC Diagnoses   Final diagnoses:  Dysuria  Vaginal discharge     Discharge Instructions     Urine results were normal today. Will send urine to be cultured. You are being treated for bacterial vaginosis today with metronidazole 500 mg twice daily for 7 days.  Avoid alcohol consumption while taking medication.    ED Prescriptions    Medication Sig Dispense Auth. Provider   metroNIDAZOLE (FLAGYL) 500 MG tablet Take 1 tablet (500 mg total) by mouth 2 (two) times daily. 14 tablet Scot Jun, FNP     PDMP not reviewed this encounter.   Scot Jun,  FNP 03/23/19 [phone]

## 2019-03-21 NOTE — Discharge Instructions (Addendum)
Urine results were normal today. Will send urine to be cultured. You are being treated for bacterial vaginosis today with metronidazole 500 mg twice daily for 7 days.  Avoid alcohol consumption while taking medication.

## 2019-03-23 LAB — URINE CULTURE: Culture: 1000 — AB

## 2019-03-27 LAB — CERVICOVAGINAL ANCILLARY ONLY
Bacterial vaginitis: POSITIVE — AB
Candida vaginitis: NEGATIVE
Chlamydia: NEGATIVE
Neisseria Gonorrhea: NEGATIVE
Trichomonas: NEGATIVE

## 2019-05-16 ENCOUNTER — Other Ambulatory Visit: Payer: Self-pay

## 2019-05-16 ENCOUNTER — Ambulatory Visit (HOSPITAL_COMMUNITY)
Admission: EM | Admit: 2019-05-16 | Discharge: 2019-05-16 | Disposition: A | Discharge status: Home / Self Care | Payer: Medicare Other | Attending: Family Medicine | Admitting: Family Medicine

## 2019-05-16 ENCOUNTER — Encounter (HOSPITAL_COMMUNITY): Payer: Self-pay

## 2019-05-16 DIAGNOSIS — J45909 Unspecified asthma, uncomplicated: Secondary | ICD-10-CM | POA: Diagnosis not present

## 2019-05-16 DIAGNOSIS — I471 Supraventricular tachycardia: Secondary | ICD-10-CM | POA: Diagnosis not present

## 2019-05-16 DIAGNOSIS — F330 Major depressive disorder, recurrent, mild: Secondary | ICD-10-CM | POA: Insufficient documentation

## 2019-05-16 DIAGNOSIS — Z90710 Acquired absence of both cervix and uterus: Secondary | ICD-10-CM | POA: Diagnosis not present

## 2019-05-16 DIAGNOSIS — D649 Anemia, unspecified: Secondary | ICD-10-CM | POA: Insufficient documentation

## 2019-05-16 DIAGNOSIS — Z79899 Other long term (current) drug therapy: Secondary | ICD-10-CM | POA: Insufficient documentation

## 2019-05-16 DIAGNOSIS — R300 Dysuria: Secondary | ICD-10-CM | POA: Diagnosis not present

## 2019-05-16 DIAGNOSIS — R195 Other fecal abnormalities: Secondary | ICD-10-CM | POA: Insufficient documentation

## 2019-05-16 DIAGNOSIS — K219 Gastro-esophageal reflux disease without esophagitis: Secondary | ICD-10-CM | POA: Insufficient documentation

## 2019-05-16 DIAGNOSIS — F419 Anxiety disorder, unspecified: Secondary | ICD-10-CM | POA: Insufficient documentation

## 2019-05-16 DIAGNOSIS — R1030 Lower abdominal pain, unspecified: Secondary | ICD-10-CM | POA: Diagnosis present

## 2019-05-16 DIAGNOSIS — K648 Other hemorrhoids: Secondary | ICD-10-CM | POA: Diagnosis not present

## 2019-05-16 DIAGNOSIS — R350 Frequency of micturition: Secondary | ICD-10-CM | POA: Insufficient documentation

## 2019-05-16 DIAGNOSIS — F603 Borderline personality disorder: Secondary | ICD-10-CM | POA: Insufficient documentation

## 2019-05-16 DIAGNOSIS — R110 Nausea: Secondary | ICD-10-CM | POA: Diagnosis not present

## 2019-05-16 DIAGNOSIS — Z20822 Contact with and (suspected) exposure to covid-19: Secondary | ICD-10-CM | POA: Diagnosis not present

## 2019-05-16 LAB — POCT URINALYSIS DIP (DEVICE)
Bilirubin Urine: NEGATIVE
Glucose, UA: NEGATIVE mg/dL
Hgb urine dipstick: NEGATIVE
Ketones, ur: NEGATIVE mg/dL
Nitrite: POSITIVE — AB
Protein, ur: NEGATIVE mg/dL
Specific Gravity, Urine: 1.02 (ref 1.005–1.030)
Urobilinogen, UA: 0.2 mg/dL (ref 0.0–1.0)
pH: 7 (ref 5.0–8.0)

## 2019-05-16 MED ORDER — HYDROCORTISONE ACETATE 25 MG RE SUPP: 25.0000 mg | Freq: Two times a day (BID) | RECTAL | 0 refills | Status: DC

## 2019-05-16 MED ORDER — NITROFURANTOIN MONOHYD MACRO 100 MG PO CAPS: 100.0000 mg | ORAL_CAPSULE | Freq: Two times a day (BID) | ORAL | 0 refills | Status: AC

## 2019-05-16 NOTE — Discharge Instructions (Signed)
COVID swab pending, monitor my chart for results, quarantine until results return Continue to rest and drink plenty of fluids Use Anusol suppositories with lubricant inserted rectally twice daily, monitor for symptoms to return to normal Urine was suggestive of possible UTI, begin Macrobid twice daily for 5 days, urine culture pending Vaginal and anal swabs also pending  Please continue to monitor discomfort and symptoms, follow-up if symptoms changing, worsening, not improving

## 2019-05-16 NOTE — ED Provider Notes (Signed)
[facility]    CSN: [id number] Arrival date & time: 05/16/19  [id number]      History   Chief Complaint Chief Complaint  Patient presents with  . Abdominal Pain  . Fatigue  . Urinary Frequency    HPI [name] is a 39 y.o. female history of GERD, prior BV, hysterectomy and tubal ligation presenting today for evaluation of abdominal pain.  Patient states that over the past week she has had some lower abdominal cramping.  She has had associated fatigue.  At times she reports occasional dysuria and urinary frequency.  She is also concerned as she has noticed a change in her bowel movements.  She reports "white strings" floating in the water.  She did have some rectal pain and itching a few days ago, but this has improved with using hemorrhoid cream.  Denies change in consistency of stools, continue to be formed.  She denies any rectal bleeding.  She has had some associated nausea, but denies vomiting.  She does report she did recently have intercourse with a new partner and did have anal intercourse.  She feels her symptoms did start afterwards.   HPI  Past Medical History:  Diagnosis Date  . Anemia    h/o  . Anginal pain ([place])    pt. takes nitrostat- as needed, hasn't had since 04/2012, sees Dr. Terrence Dupont, last ekg was /w PCP- [facility]  . Anxiety   . Asthma   . Borderline personality disorder ([place])   . Chiari malformation   . Depression   . GERD (gastroesophageal reflux disease)   . Headache(784.0)   . Meningitis    Oct. 2013  . Neuromuscular disorder ([place])    muscle spasms-back & arms     Patient Active Problem List   Diagnosis Date Noted  . 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 SVT (supraventricular tachycardia) ([place]) 01/22/2012  . E coli bacteremia 01/22/2012  . Sepsis(995.91) 01/20/2012  . UTI (lower urinary tract infection) 01/20/2012  . Arnold-Chiari malformation, type I ([place]) 01/20/2012  . Headache(784.0) 01/19/2012     Past Surgical History:  Procedure Laterality Date  . ABDOMINAL HYSTERECTOMY    . PERIPHERALLY INSERTED CENTRAL CATHETER INSERTION     during event of Meningitis- then d/c'd to home /w & treated /w antibiotics   . SUBOCCIPITAL CRANIECTOMY CERVICAL LAMINECTOMY N/A 07/22/2012   Procedure: SUBOCCIPITAL CRANIECTOMY CERVICAL LAMINECTOMY/DURAPLASTY;  Surgeon: Hosie Spangle, MD;  Location: [facility];  Service: Neurosurgery;  Laterality: N/A;  Suboccipital craniectomy with upper cervial laminectomy with duroplasty   . TUBAL LIGATION    . VAGINAL DELIVERY     x3  . WISDOM TOOTH EXTRACTION      OB History   No obstetric history on file.      Home Medications    Prior to Admission medications   Medication Sig Start Date End Date Taking? Authorizing Provider  fexofenadine (ALLEGRA) 180 MG tablet Take 180 mg by mouth daily.   Yes [provider]  citalopram (CELEXA) 10 MG tablet Take 1 tablet (10 mg total) by mouth daily. Patient taking differently: Take 20 mg by mouth at bedtime.  04/20/18   Patrecia Pour, NP  hydrocortisone (ANUSOL-HC) 25 MG suppository Place 1 suppository (25 mg total) rectally 2 (two) times daily. 05/16/19   Gavin Telford C, PA-C  ibuprofen (ADVIL,MOTRIN) 800 MG tablet Take 800 mg by mouth 2 (two) times daily as needed for moderate pain.  04/05/17   [provider]  nitrofurantoin, macrocrystal-monohydrate, (MACROBID) 100 MG capsule Take 1 capsule (100 mg total) by mouth 2 (two) times daily for 5 days. 05/16/19 05/21/19  Duan Scharnhorst, Elesa Hacker, PA-C    Family History Family History  Problem Relation Age of Onset  . Heart attack Mother   . Healthy Father     Social History Social History   Tobacco Use  . Smoking status: Never Smoker  . Smokeless tobacco: Never Used  Substance Use Topics  . Alcohol use: No  . Drug use: No     Allergies   Amoxicillin, Imitrex [sumatriptan base], Penicillins, Tramadol, Norco [hydrocodone-acetaminophen],  Percocet [oxycodone-acetaminophen], and Soy allergy   Review of Systems Review of Systems  Constitutional: Negative for activity change, appetite change, chills, fatigue and fever.  HENT: Negative for congestion, ear pain, rhinorrhea, sinus pressure, sore throat and trouble swallowing.   Eyes: Negative for discharge and redness.  Respiratory: Negative for cough, chest tightness and shortness of breath.   Cardiovascular: Negative for chest pain.  Gastrointestinal: Positive for abdominal pain and nausea. Negative for diarrhea, rectal pain and vomiting.  Genitourinary: Positive for frequency. Negative for dysuria, flank pain, genital sores, hematuria, menstrual problem, vaginal bleeding, vaginal discharge and vaginal pain.  Musculoskeletal: Negative for back pain and myalgias.  Skin: Negative for rash.  Neurological: Negative for dizziness, light-headedness and headaches.     Physical Exam Triage Vital Signs ED Triage Vitals  Enc Vitals Group     BP 05/16/19 1030 120/81     Pulse Rate 05/16/19 1030 68     Resp 05/16/19 1030 16     Temp 05/16/19 1030 98.2 F (36.8 C)     Temp Source 05/16/19 1030 Oral     SpO2 05/16/19 1030 98 %     Weight --      Height --      Head Circumference --      Peak Flow --      Pain Score 05/16/19 1028 4     Pain Loc --      Pain Edu? --      Excl. in [place]? --    No data found.  Updated Vital Signs BP 120/81 (BP Location: Right Arm)   Pulse 68   Temp 98.2 F (36.8 C) (Oral)   Resp 16   SpO2 98%   Visual Acuity Right Eye Distance:   Left Eye Distance:   Bilateral Distance:    Right Eye Near:   Left Eye Near:    Bilateral Near:     Physical Exam Vitals and nursing note reviewed.  Constitutional:      General: She is not in acute distress.    Appearance: She is well-developed.  HENT:     Head: Normocephalic and atraumatic.     Mouth/Throat:     Comments: Oral mucosa pink and moist, no tonsillar enlargement or exudate. Posterior  pharynx patent and nonerythematous, no uvula deviation or swelling. Normal phonation. Eyes:     Conjunctiva/sclera: Conjunctivae normal.  Cardiovascular:     Rate and Rhythm: Normal rate and regular rhythm.     Heart sounds: No murmur.  Pulmonary:     Effort: Pulmonary effort is normal. No respiratory distress.     Breath sounds: Normal breath sounds.     Comments: Breathing comfortably at rest, CTABL, no wheezing, rales or other adventitious sounds auscultated Abdominal:     Palpations: Abdomen is soft.     Tenderness: There is no abdominal tenderness.  Comments: Soft, nondistended, well-healed mid lower abdomen surgical scar, bilateral lower quadrant tenderness, no focal tenderness, negative rebound, negative McBurney's, negative Rovsing  Genitourinary:    Comments: Normal external female genitalia, vaginal mucosa pink with small amount of white discharge present  Prolapsed internal hemorrhoid present on rectal exam, no significant tenderness, erythema or induration noted around rectum externally, no abnormality palpated internally Musculoskeletal:     Cervical back: Neck supple.  Skin:    General: Skin is warm and dry.  Neurological:     Mental Status: She is alert.      UC Treatments / Results  Labs (all labs ordered are listed, but only abnormal results are displayed) Labs Reviewed  POCT URINALYSIS DIP (DEVICE) - Abnormal; Notable for the following components:      Result Value   Nitrite POSITIVE (*)    Leukocytes,Ua TRACE (*)    All other components within normal limits  NOVEL CORONAVIRUS, NAA (HOSP ORDER, SEND-OUT TO REF LAB; TAT 18-24 HRS)  URINE CULTURE  CERVICOVAGINAL ANCILLARY ONLY  CYTOLOGY, (ORAL, ANAL, URETHRAL) ANCILLARY ONLY    EKG   Radiology No results found.  Procedures Procedures (including critical care time)  Medications Ordered in UC Medications - No data to display  Initial Impression / Assessment and Plan / UC Course  I have reviewed  the triage vital signs and the nursing notes.  Pertinent labs & imaging results that were available during my care of the patient were reviewed by me and considered in my medical decision making (see chart for details).     Covid PCR pending given associated abdominal pain and fatigue to rule out.  Patient does have a prolapsed internal hemorrhoid which was reducible on exam, will provide Anusol HC to treat this.  Unclear what white strings are, does not seem parasitic based off description and pictures shown by patient.  Recommending to continue to monitor.  Vaginal swab pending to evaluate for vaginal causes given she has a history of BV.  Positive nitrites and trace leuks, will treat for UTI with Macrobid twice daily x5 days.Discussed strict return precautions. Patient verbalized understanding and is agreeable with plan.  Final Clinical Impressions(s) / UC Diagnoses   Final diagnoses:  Lower abdominal pain  Urinary frequency  Nonspecific abnormal finding in stool contents  Internal hemorrhoid     Discharge Instructions     COVID swab pending, monitor my chart for results, quarantine until results return Continue to rest and drink plenty of fluids Use Anusol suppositories with lubricant inserted rectally twice daily, monitor for symptoms to return to normal Urine was suggestive of possible UTI, begin Macrobid twice daily for 5 days, urine culture pending Vaginal and anal swabs also pending  Please continue to monitor discomfort and symptoms, follow-up if symptoms changing, worsening, not improving    ED Prescriptions    Medication Sig Dispense Auth. Provider   nitrofurantoin, macrocrystal-monohydrate, (MACROBID) 100 MG capsule Take 1 capsule (100 mg total) by mouth 2 (two) times daily for 5 days. 10 capsule Zair Borawski C, PA-C   hydrocortisone (ANUSOL-HC) 25 MG suppository Place 1 suppository (25 mg total) rectally 2 (two) times daily. 12 suppository Teara Duerksen, [place] C, PA-C      PDMP not reviewed this encounter.   Janith Lima, [place] 05/16/19 1338

## 2019-05-16 NOTE — ED Triage Notes (Signed)
Pt presents to UC with urinary frequency, lower abdominal pain and fatigue x 1 week. Pt reports in the past 3 days she saw white stripes in her stool.

## 2019-05-18 LAB — URINE CULTURE: Culture: >=100000 — AB

## 2019-05-18 LAB — NOVEL CORONAVIRUS, NAA (HOSP ORDER, SEND-OUT TO REF LAB; TAT 18-24 HRS): SARS-CoV-2, NAA: NOT DETECTED

## 2019-05-19 LAB — CYTOLOGY, (ORAL, ANAL, URETHRAL) ANCILLARY ONLY
Chlamydia: NEGATIVE
Neisseria Gonorrhea: NEGATIVE
Trichomonas: NEGATIVE

## 2019-05-21 LAB — CERVICOVAGINAL ANCILLARY ONLY
Bacterial vaginitis: NEGATIVE
Candida vaginitis: POSITIVE — AB
Chlamydia: NEGATIVE
Neisseria Gonorrhea: NEGATIVE
Trichomonas: NEGATIVE

## 2019-05-23 ENCOUNTER — Telehealth (HOSPITAL_COMMUNITY): Payer: Self-pay | Admitting: Emergency Medicine

## 2019-05-23 MED ORDER — FLUCONAZOLE 150 MG PO TABS: 150.0000 mg | ORAL_TABLET | Freq: Once | ORAL | 0 refills | Status: AC

## 2019-05-23 NOTE — Telephone Encounter (Signed)
Test for candida (yeast) was positive.  Prescription for fluconazole 150mg  po now, repeat dose in 3d if needed, #2 no refills, sent to the pharmacy of record.  Recheck or followup with PCP for further evaluation if symptoms are not improving.    Patient contacted by phone and made aware of    results. Pt verbalized understanding and had all questions answered.

## 2019-05-27 ENCOUNTER — Encounter (HOSPITAL_COMMUNITY): Payer: Self-pay

## 2019-05-27 ENCOUNTER — Ambulatory Visit (HOSPITAL_COMMUNITY)
Admission: EM | Admit: 2019-05-27 | Discharge: 2019-05-27 | Disposition: A | Discharge status: Home / Self Care | Payer: Medicare Other | Attending: Family Medicine | Admitting: Family Medicine

## 2019-05-27 ENCOUNTER — Other Ambulatory Visit: Payer: Self-pay

## 2019-05-27 DIAGNOSIS — N390 Urinary tract infection, site not specified: Secondary | ICD-10-CM | POA: Insufficient documentation

## 2019-05-27 DIAGNOSIS — R300 Dysuria: Secondary | ICD-10-CM | POA: Diagnosis not present

## 2019-05-27 LAB — POCT URINALYSIS DIP (DEVICE)
Bilirubin Urine: NEGATIVE
Glucose, UA: NEGATIVE mg/dL
Hgb urine dipstick: NEGATIVE
Ketones, ur: NEGATIVE mg/dL
Nitrite: POSITIVE — AB
Protein, ur: NEGATIVE mg/dL
Specific Gravity, Urine: 1.025 (ref 1.005–1.030)
Urobilinogen, UA: 0.2 mg/dL (ref 0.0–1.0)
pH: 6.5 (ref 5.0–8.0)

## 2019-05-27 MED ORDER — CIPROFLOXACIN HCL 500 MG PO TABS: 500.0000 mg | ORAL_TABLET | Freq: Two times a day (BID) | ORAL | 0 refills | Status: AC

## 2019-05-27 MED ORDER — FLUCONAZOLE 150 MG PO TABS: 150.0000 mg | ORAL_TABLET | Freq: Once | ORAL | 0 refills | Status: AC

## 2019-05-27 NOTE — ED Provider Notes (Signed)
[facility]    CSN: [id number] Arrival date & time: 05/27/19  [id number]      History   Chief Complaint Chief Complaint  Patient presents with  . Dysuria  . Back Pain    HPI [name] is a 39 y.o. female history of tubal ligation and hysterectomy presenting today for evaluation of possible UTI.  Patient states that over the past 2 days she has had dysuria, urgency, burning and urinary frequency.  She has had some mild low back pain.  Denies any fevers, nausea or vomiting.  Denies any vaginal symptoms of discharge, itching or irritation.  Patient was seen here recently approximately 10 days ago with positive urine culture with multiple resistance, but was treated with Macrobid.  Symptoms resolved and patient took a course of antibiotics.  She also had a yeast infection at the time.  Symptoms returned 2 days ago.  Denies significant history of recurrent UTIs until recently.  HPI  Past Medical History:  Diagnosis Date  . Anemia    h/o  . Anginal pain ([place])    pt. takes nitrostat- as needed, hasn't had since 04/2012, sees Dr. Terrence Dupont, last ekg was /w PCP- [facility]  . Anxiety   . Asthma   . Borderline personality disorder ([place])   . Chiari malformation   . Depression   . GERD (gastroesophageal reflux disease)   . Headache(784.0)   . Meningitis    Oct. 2013  . Neuromuscular disorder ([place])    muscle spasms-back & arms     Patient Active Problem List   Diagnosis Date Noted  . Major depressive disorder, recurrent episode, mild ([place]) 04/20/2018  . SVT (supraventricular tachycardia) ([place]) 01/22/2012  . E coli bacteremia 01/22/2012  . Sepsis(995.91) 01/20/2012  . UTI (lower urinary tract infection) 01/20/2012  . Arnold-Chiari malformation, type I ([place]) 01/20/2012  . Headache(784.0) 01/19/2012    Past Surgical History:  Procedure Laterality Date  . ABDOMINAL HYSTERECTOMY    . PERIPHERALLY INSERTED CENTRAL CATHETER INSERTION     during event of Meningitis- then  d/c'd to home /w & treated /w antibiotics   . SUBOCCIPITAL CRANIECTOMY CERVICAL LAMINECTOMY N/A 07/22/2012   Procedure: SUBOCCIPITAL CRANIECTOMY CERVICAL LAMINECTOMY/DURAPLASTY;  Surgeon: Hosie Spangle, MD;  Location: [facility];  Service: Neurosurgery;  Laterality: N/A;  Suboccipital craniectomy with upper cervial laminectomy with duroplasty   . TUBAL LIGATION    . VAGINAL DELIVERY     x3  . WISDOM TOOTH EXTRACTION      OB History   No obstetric history on file.      Home Medications    Prior to Admission medications   Medication Sig Start Date End Date Taking? Authorizing Provider  citalopram (CELEXA) 10 MG tablet Take 1 tablet (10 mg total) by mouth daily. Patient taking differently: Take 20 mg by mouth at bedtime.  04/20/18  Yes Patrecia Pour, NP  fexofenadine (ALLEGRA) 180 MG tablet Take 180 mg by mouth daily.   Yes [provider]  ibuprofen (ADVIL,MOTRIN) 800 MG tablet Take 800 mg by mouth 2 (two) times daily as needed for moderate pain.  04/05/17  Yes [provider]  ciprofloxacin (CIPRO) 500 MG tablet Take 1 tablet (500 mg total) by mouth every 12 (twelve) hours for 7 days. 05/27/19 06/03/19  Yanna Leaks C, PA-C  fluconazole (DIFLUCAN) 150 MG tablet Take 1 tablet (150 mg total) by mouth once for 1 dose. 05/27/19 05/27/19  Janith Lima, PA-C    Family History  Family History  Problem Relation Age of Onset  . Heart attack Mother   . Healthy Father     Social History Social History   Tobacco Use  . Smoking status: Never Smoker  . Smokeless tobacco: Never Used  Substance Use Topics  . Alcohol use: No  . Drug use: No     Allergies   Amoxicillin, Imitrex [sumatriptan base], Penicillins, Tramadol, Norco [hydrocodone-acetaminophen], Percocet [oxycodone-acetaminophen], and Soy allergy   Review of Systems Review of Systems  Constitutional: Negative for fever.  Respiratory: Negative for shortness of breath.   Cardiovascular: Negative for  chest pain.  Gastrointestinal: Negative for abdominal pain, diarrhea, nausea and vomiting.  Genitourinary: Positive for dysuria, frequency and urgency. Negative for flank pain, genital sores, hematuria, menstrual problem, vaginal bleeding, vaginal discharge and vaginal pain.  Musculoskeletal: Negative for back pain.  Skin: Negative for rash.  Neurological: Negative for dizziness, light-headedness and headaches.     Physical Exam Triage Vital Signs ED Triage Vitals  Enc Vitals Group     BP 05/27/19 0826 108/68     Pulse Rate 05/27/19 0826 87     Resp 05/27/19 0826 16     Temp 05/27/19 0826 98.2 F (36.8 C)     Temp Source 05/27/19 0826 Oral     SpO2 05/27/19 0826 100 %     Weight --      Height --      Head Circumference --      Peak Flow --      Pain Score 05/27/19 0828 7     Pain Loc --      Pain Edu? --      Excl. in [place]? --    No data found.  Updated Vital Signs BP 108/68 (BP Location: Left Arm)   Pulse 87   Temp 98.2 F (36.8 C) (Oral)   Resp 16   SpO2 100%   Visual Acuity Right Eye Distance:   Left Eye Distance:   Bilateral Distance:    Right Eye Near:   Left Eye Near:    Bilateral Near:     Physical Exam Vitals and nursing note reviewed.  Constitutional:      Appearance: She is well-developed.     Comments: No acute distress  HENT:     Head: Normocephalic and atraumatic.     Nose: Nose normal.  Eyes:     Conjunctiva/sclera: Conjunctivae normal.  Cardiovascular:     Rate and Rhythm: Normal rate and regular rhythm.  Pulmonary:     Effort: Pulmonary effort is normal. No respiratory distress.     Comments: Breathing comfortably at rest, CTABL, no wheezing, rales or other adventitious sounds auscultated Abdominal:     General: There is no distension.     Comments: Tender to palpation to bilateral lower quadrants and suprapubic area, no CVA tenderness  Musculoskeletal:        General: Normal range of motion.     Cervical back: Neck supple.      Comments: Mild tenderness to palpation to lower lumbar back bilaterally  Skin:    General: Skin is warm and dry.  Neurological:     Mental Status: She is alert and oriented to person, place, and time.      UC Treatments / Results  Labs (all labs ordered are listed, but only abnormal results are displayed) Labs Reviewed  POCT URINALYSIS DIP (DEVICE) - Abnormal; Notable for the following components:      Result Value   Nitrite  POSITIVE (*)    Leukocytes,Ua TRACE (*)    All other components within normal limits  URINE CULTURE    EKG   Radiology No results found.  Procedures Procedures (including critical care time)  Medications Ordered in UC Medications - No data to display  Initial Impression / Assessment and Plan / UC Course  I have reviewed the triage vital signs and the nursing notes.  Pertinent labs & imaging results that were available during my care of the patient were reviewed by me and considered in my medical decision making (see chart for details).     Positive nitrite, trace leuks, will send for culture.  Given resistance pattern of prior culture and recently on Macrobid will provide Cipro as alternative to take twice daily for 1 week.  Continue to push fluids.  Discussed if continuing to have recurrent UTIs to follow-up with OB/GYN/urology.  No signs of pyelonephritis at this time.  Discussed strict return precautions. Patient verbalized understanding and is agreeable with plan.  Final Clinical Impressions(s) / UC Diagnoses   Final diagnoses:  Lower urinary tract infectious disease     Discharge Instructions     Urine showed evidence of infection. We are treating you with cipro twice daily for 1 week. Be sure to take full course. Stay hydrated- urine should be pale yellow to clear.   Please return or follow up with your primary provider if symptoms not improving with treatment. Please return sooner if you have worsening of symptoms or develop fever,  nausea, vomiting, abdominal pain, back pain, lightheadedness, dizziness.   ED Prescriptions    Medication Sig Dispense Auth. Provider   ciprofloxacin (CIPRO) 500 MG tablet Take 1 tablet (500 mg total) by mouth every 12 (twelve) hours for 7 days. 14 tablet Rehema Muffley C, PA-C   fluconazole (DIFLUCAN) 150 MG tablet Take 1 tablet (150 mg total) by mouth once for 1 dose. 2 tablet Atari Novick, [place] C, PA-C     PDMP not reviewed this encounter.   Janith Lima, [place] 05/27/19 [phone]

## 2019-05-27 NOTE — Discharge Instructions (Signed)
Urine showed evidence of infection. We are treating you with cipro- twice daily for 1 week. Be sure to take full course. Stay hydrated- urine should be pale yellow to clear.   Please return or follow up with your primary provider if symptoms not improving with treatment. Please return sooner if you have worsening of symptoms or develop fever, nausea, vomiting, abdominal pain, back pain, lightheadedness, dizziness. 

## 2019-05-27 NOTE — ED Triage Notes (Signed)
Pt c/o burning, urgency, frequency, and bladder pressure with urination, also c/o low back pain x2 days. Denies fever, chills, vomiting. Took ibuprofen at approx 0700 this morning.

## 2019-05-29 LAB — URINE CULTURE
Culture: >=100000 — AB
Special Requests: NORMAL

## 2019-07-29 ENCOUNTER — Other Ambulatory Visit: Payer: Self-pay

## 2019-07-29 ENCOUNTER — Encounter (HOSPITAL_COMMUNITY): Payer: Self-pay

## 2019-07-29 ENCOUNTER — Ambulatory Visit (INDEPENDENT_AMBULATORY_CARE_PROVIDER_SITE_OTHER): Payer: Medicare Other

## 2019-07-29 ENCOUNTER — Ambulatory Visit (HOSPITAL_COMMUNITY)
Admission: EM | Admit: 2019-07-29 | Discharge: 2019-07-29 | Disposition: A | Discharge status: Home / Self Care | Payer: Medicare Other | Attending: Family Medicine | Admitting: Family Medicine

## 2019-07-29 DIAGNOSIS — M25562 Pain in left knee: Secondary | ICD-10-CM | POA: Diagnosis not present

## 2019-07-29 DIAGNOSIS — M7042 Prepatellar bursitis, left knee: Secondary | ICD-10-CM

## 2019-07-29 NOTE — ED Provider Notes (Signed)
[place]    CSN: [id number] Arrival date & time: 07/29/19  1907      History   Chief Complaint Chief Complaint  Patient presents with  . Knee Injury    HPI [name] is a 39 y.o. female.   She is presenting an accident earlier tonight.  She landed on the patella.  She is having some swelling and has an abrasion over the left knee.  No history of similar pain.  Pain is localized to the knee.  No numbness or tingling.  HPI  Past Medical History:  Diagnosis Date  . Anemia    h/o  . Anginal pain ([place])    pt. takes nitrostat- as needed, hasn't had since 04/2012, sees Dr. Terrence Dupont, last ekg was /w PCP- Avbere  . Anxiety   . Asthma   . Borderline personality disorder ([place])   . Chiari malformation   . Depression   . GERD (gastroesophageal reflux disease)   . Headache(784.0)   . Meningitis    Oct. 2013  . Neuromuscular disorder ([place])    muscle spasms-back & arms     Patient Active Problem List   Diagnosis Date Noted  . Major depressive disorder, recurrent episode, mild ([place]) 04/20/2018  . SVT (supraventricular tachycardia) ([place]) 01/22/2012  . E coli bacteremia 01/22/2012  . Sepsis(995.91) 01/20/2012  . UTI (lower urinary tract infection) 01/20/2012  . Arnold-Chiari malformation, type I ([place]) 01/20/2012  . Headache(784.0) 01/19/2012    Past Surgical History:  Procedure Laterality Date  . ABDOMINAL HYSTERECTOMY    . PERIPHERALLY INSERTED CENTRAL CATHETER INSERTION     during event of Meningitis- then d/c'd to home /w & treated /w antibiotics   . SUBOCCIPITAL CRANIECTOMY CERVICAL LAMINECTOMY N/A 07/22/2012   Procedure: SUBOCCIPITAL CRANIECTOMY CERVICAL LAMINECTOMY/DURAPLASTY;  Surgeon: Hosie Spangle, MD;  Location: [facility];  Service: Neurosurgery;  Laterality: N/A;  Suboccipital craniectomy with upper cervial laminectomy with duroplasty   . TUBAL LIGATION    . VAGINAL DELIVERY     x3  . WISDOM TOOTH EXTRACTION      OB History   No  obstetric history on file.      Home Medications    Prior to Admission medications   Medication Sig Start Date End Date Taking? Authorizing Provider  citalopram (CELEXA) 10 MG tablet Take 1 tablet (10 mg total) by mouth daily. Patient taking differently: Take 20 mg by mouth at bedtime.  04/20/18   Patrecia Pour, NP  fexofenadine (ALLEGRA) 180 MG tablet Take 180 mg by mouth daily.    [provider]  ibuprofen (ADVIL,MOTRIN) 800 MG tablet Take 800 mg by mouth 2 (two) times daily as needed for moderate pain.  04/05/17   [provider]    Family History Family History  Problem Relation Age of Onset  . Heart attack Mother   . Healthy Father     Social History Social History   Tobacco Use  . Smoking status: Never Smoker  . Smokeless tobacco: Never Used  Substance Use Topics  . Alcohol use: No  . Drug use: No     Allergies   Amoxicillin, Imitrex [sumatriptan base], Penicillins, Tramadol, Norco [hydrocodone-acetaminophen], Percocet [oxycodone-acetaminophen], and Soy allergy   Review of Systems Review of Systems See HPI  Physical Exam Triage Vital Signs ED Triage Vitals  Enc Vitals Group     BP 07/29/19 2000 111/73     Pulse Rate 07/29/19 2000 77     Resp 07/29/19 2000  18     Temp 07/29/19 2000 99.3 F (37.4 C)     Temp Source 07/29/19 2000 Oral     SpO2 07/29/19 2000 98 %     Weight 07/29/19 1958 182 lb (82.6 kg)     Height --      Head Circumference --      Peak Flow --      Pain Score 07/29/19 1958 6     Pain Loc --      Pain Edu? --      Excl. in [place]? --    No data found.  Updated Vital Signs BP 111/73 (BP Location: Right Arm)   Pulse 77   Temp 99.3 F (37.4 C) (Oral)   Resp 18   Wt 82.6 kg   SpO2 98%   BMI 31.24 kg/m   Visual Acuity Right Eye Distance:   Left Eye Distance:   Bilateral Distance:    Right Eye Near:   Left Eye Near:    Bilateral Near:     Physical Exam Gen: NAD, alert, cooperative with exam,  well-appearing Neuro: normal tone, normal sensation to touch MSK:  Left knee:  Prepatellar bursa. Abrasion over the patella. Normal range of motion. No instability. Normal strength resistance. Neurovascularly intact   UC Treatments / Results  Labs (all labs ordered are listed, but only abnormal results are displayed) Labs Reviewed - No data to display  EKG   Radiology DG Knee AP/LAT W/[place] Left  Result Date: 07/29/2019 CLINICAL DATA:  Left knee pain EXAM: LEFT KNEE 3 VIEWS COMPARISON:  None FINDINGS: Anterior soft tissue swelling. No acute bony abnormality. Specifically, no fracture, subluxation, or dislocation. No joint effusion. Joint spaces maintained. IMPRESSION: Anterior soft tissue swelling. No acute bony abnormality. Electronically Signed   By: Rolm Baptise M.D.   On: 07/29/2019 20:37    Procedures Procedures (including critical care time)  Medications Ordered in UC Medications - No data to display  Initial Impression / Assessment and Plan / UC Course  I have reviewed the triage vital signs and the nursing notes.  Pertinent labs & imaging results that were available during my care of the patient were reviewed by me and considered in my medical decision making (see chart for details).     [name] is a 39 year old female is presenting with left knee pain after a fall.  Imaging was negative for fracture.  Appears that her prepatellar bursitis that is traumatic in nature.  Provided with compression and counseled on supportive care.  Given indications to follow-up and return.  Final Clinical Impressions(s) / UC Diagnoses   Final diagnoses:  Prepatellar bursitis of left knee     Discharge Instructions     Please try ice  Please try compression  Please follow up if your symptoms fail to improve.     ED Prescriptions    None     PDMP not reviewed this encounter.   Rosemarie Ax, MD 07/29/19 2110

## 2019-07-29 NOTE — Discharge Instructions (Addendum)
Please try ice  Please try compression  Please follow up if your symptoms fail to improve.

## 2019-07-29 NOTE — ED Triage Notes (Signed)
Pt state she was skating and fast too. Pt states she has a abrasion on her left knee and swelling. This happened a 1 hour ago.

## 2019-08-14 ENCOUNTER — Encounter (HOSPITAL_COMMUNITY): Payer: Self-pay

## 2019-08-14 ENCOUNTER — Other Ambulatory Visit: Payer: Self-pay

## 2019-08-14 ENCOUNTER — Ambulatory Visit (HOSPITAL_COMMUNITY)
Admission: EM | Admit: 2019-08-14 | Discharge: 2019-08-14 | Disposition: A | Discharge status: Home / Self Care | Payer: Medicare Other

## 2019-08-14 DIAGNOSIS — J3489 Other specified disorders of nose and nasal sinuses: Secondary | ICD-10-CM

## 2019-08-14 DIAGNOSIS — R05 Cough: Secondary | ICD-10-CM | POA: Diagnosis not present

## 2019-08-14 DIAGNOSIS — J069 Acute upper respiratory infection, unspecified: Secondary | ICD-10-CM

## 2019-08-14 DIAGNOSIS — J029 Acute pharyngitis, unspecified: Secondary | ICD-10-CM | POA: Diagnosis not present

## 2019-08-14 DIAGNOSIS — R0981 Nasal congestion: Secondary | ICD-10-CM | POA: Diagnosis not present

## 2019-08-14 DIAGNOSIS — R059 Cough, unspecified: Secondary | ICD-10-CM

## 2019-08-14 DIAGNOSIS — Z1152 Encounter for screening for COVID-19: Secondary | ICD-10-CM

## 2019-08-14 NOTE — ED Provider Notes (Signed)
[place]    CSN: [id number] Arrival date & time: 08/14/19  [id number]      History   Chief Complaint Chief Complaint  Patient presents with  . Cough  . Nasal Congestion    HPI [name] is a 39 y.o. female.   Patient reports that she has been experiencing cough, nasal congestion, rhinorrhea, headache for the last 3 days.  Reports sore throat as well.  Reports bilateral ear pain that has since resolved.  Patient has been taking Allegra at home for the symptoms with some improvement.  She has been using hot tea and honey with lemon for her sore throat.  She has medical history of Chiari malformation, SVT, depression.  She is being treated for the use as well.  Denies nausea, vomiting, diarrhea, rash, fever, other symptoms.  ROS per HPI  The history is provided by the patient.  Cough   Past Medical History:  Diagnosis Date  . Anemia    h/o  . Anginal pain ([place])    pt. takes nitrostat- as needed, hasn't had since 04/2012, sees Dr. Terrence Dupont, last ekg was /w PCP- [facility]bere  . Anxiety   . Asthma   . Borderline personality disorder ([place])   . Chiari malformation   . Depression   . GERD (gastroesophageal reflux disease)   . Headache(784.0)   . Meningitis    Oct. 2013  . Neuromuscular disorder ([place])    muscle spasms-back & arms     Patient Active Problem List   Diagnosis Date Noted  . Major depressive disorder, recurrent episode, mild ([place]) 04/20/2018  . SVT (supraventricular tachycardia) ([place]) 01/22/2012  . E coli bacteremia 01/22/2012  . Sepsis(995.91) 01/20/2012  . UTI (lower urinary tract infection) 01/20/2012  . Arnold-Chiari malformation, type I ([place]) 01/20/2012  . Headache(784.0) 01/19/2012    Past Surgical History:  Procedure Laterality Date  . ABDOMINAL HYSTERECTOMY    . PERIPHERALLY INSERTED CENTRAL CATHETER INSERTION     during event of Meningitis- then d/c'd to home /w & treated /w antibiotics   . SUBOCCIPITAL CRANIECTOMY CERVICAL  LAMINECTOMY N/A 07/22/2012   Procedure: SUBOCCIPITAL CRANIECTOMY CERVICAL LAMINECTOMY/DURAPLASTY;  Surgeon: Hosie Spangle, MD;  Location: [facility];  Service: Neurosurgery;  Laterality: N/A;  Suboccipital craniectomy with upper cervial laminectomy with duroplasty   . TUBAL LIGATION    . VAGINAL DELIVERY     x3  . WISDOM TOOTH EXTRACTION      OB History   No obstetric history on file.      Home Medications    Prior to Admission medications   Medication Sig Start Date End Date Taking? Authorizing Provider  citalopram (CELEXA) 10 MG tablet Take 1 tablet (10 mg total) by mouth daily. Patient taking differently: Take 20 mg by mouth at bedtime.  04/20/18   Patrecia Pour, NP  fexofenadine (ALLEGRA) 180 MG tablet Take 180 mg by mouth daily.    [provider]  ibuprofen (ADVIL,MOTRIN) 800 MG tablet Take 800 mg by mouth 2 (two) times daily as needed for moderate pain.  04/05/17   [provider]    Family History Family History  Problem Relation Age of Onset  . Heart attack Mother   . Healthy Father     Social History Social History   Tobacco Use  . Smoking status: Never Smoker  . Smokeless tobacco: Never Used  Substance Use Topics  . Alcohol use: No  . Drug use: No     Allergies  Amoxicillin, Imitrex [sumatriptan base], Penicillins, Tramadol, Norco [hydrocodone-acetaminophen], Percocet [oxycodone-acetaminophen], and Soy allergy   Review of Systems Review of Systems  Respiratory: Positive for cough.      Physical Exam Triage Vital Signs ED Triage Vitals  Enc Vitals Group     BP 08/14/19 0928 105/72     Pulse Rate 08/14/19 0928 93     Resp 08/14/19 0928 18     Temp 08/14/19 0928 98.7 F (37.1 C)     Temp Source 08/14/19 0928 Oral     SpO2 08/14/19 0928 98 %     Weight --      Height --      Head Circumference --      Peak Flow --      Pain Score 08/14/19 0926 5     Pain Loc --      Pain Edu? --      Excl. in [place]? --    No data  found.  Updated Vital Signs BP 105/72 (BP Location: Right Arm)   Pulse 93   Temp 98.7 F (37.1 C) (Oral)   Resp 18   SpO2 98%   Visual Acuity Right Eye Distance:   Left Eye Distance:   Bilateral Distance:    Right Eye Near:   Left Eye Near:    Bilateral Near:     Physical Exam Vitals and nursing note reviewed.  Constitutional:      General: She is not in acute distress.    Appearance: Normal appearance. She is well-developed and normal weight. She is not ill-appearing.  HENT:     Head: Normocephalic and atraumatic.     Right Ear: Tympanic membrane normal.     Left Ear: Tympanic membrane normal.     Nose: Congestion and rhinorrhea present.     Mouth/Throat:     Mouth: Mucous membranes are moist.     Pharynx: Oropharynx is clear. No oropharyngeal exudate or posterior oropharyngeal erythema.  Eyes:     Extraocular Movements: Extraocular movements intact.     Conjunctiva/sclera: Conjunctivae normal.     Pupils: Pupils are equal, round, and reactive to light.  Cardiovascular:     Rate and Rhythm: Normal rate and regular rhythm.     Heart sounds: Normal heart sounds. No murmur.  Pulmonary:     Effort: Pulmonary effort is normal. No respiratory distress.     Breath sounds: Normal breath sounds. No stridor. No wheezing, rhonchi or rales.  Chest:     Chest wall: No tenderness.  Abdominal:     General: Bowel sounds are normal.     Palpations: Abdomen is soft.     Tenderness: There is no abdominal tenderness.  Musculoskeletal:        General: Normal range of motion.     Cervical back: Normal range of motion and neck supple.  Lymphadenopathy:     Cervical: Cervical adenopathy (Bilaterally) present.  Skin:    General: Skin is warm and dry.     Capillary Refill: Capillary refill takes less than 2 seconds.  Neurological:     General: No focal deficit present.     Mental Status: She is alert and oriented to person, place, and time.  Psychiatric:        Mood and Affect:  Mood normal.        Behavior: Behavior normal.        Thought Content: Thought content normal.      UC Treatments / Results  Labs (all labs  ordered are listed, but only abnormal results are displayed) Labs Reviewed - No data to display  EKG   Radiology No results found.  Procedures Procedures (including critical care time)  Medications Ordered in UC Medications - No data to display  Initial Impression / Assessment and Plan / UC Course  I have reviewed the triage vital signs and the nursing notes.  Pertinent labs & imaging results that were available during my care of the patient were reviewed by me and considered in my medical decision making (see chart for details).     Viral URI: Presents with cough, congestion, rhinorrhea, sore throat for the last 3 days.  Has been using Allegra with relief has also been using hot tea with honey and lemon to relieve sore throat.  Instructed that she may continue to use these for her symptoms.  Lungs CTA bilaterally.  Heart sounds S1-S2.  Nasal congestion and rhinorrhea present, no sinus pain or pressure on palpation.  Cervical adenopathy present bilaterally to anterior chain.  Discussed with patient that she may use over-the-counter regular Mucinex, not the decongestant.  She may also use Dimetapp as needed.  Covid swab obtained this visit.  Patient structured to quarantine until results are back and negative.  Discussed that negative results will be available via MyChart.  If results are positive she is to quarantine for 10 days from today.  Instructed that she will usually be contacted if her results are positive.  Patient instructed to follow-up if she is experiencing fever, chills, worsening symptoms over the next week.  Patient instructed to go to the ER for trouble swallowing, trouble breathing, other concerning symptoms.  Patient verbalized understanding is in agreement with treatment plan. Final Clinical Impressions(s) / UC Diagnoses    Final diagnoses:  Nasal congestion  Cough  Rhinorrhea  Sore throat  Encounter for screening for COVID-19     Discharge Instructions     Your COVID test is pending.  You should self quarantine until the test result is back.    Take Tylenol as needed for fever or discomfort.  Rest and keep yourself hydrated.    Go to the emergency department if you develop shortness of breath, severe diarrhea, high fever not relieved by Tylenol or ibuprofen, or other concerning symptoms.    You may also take Dimetapp as needed for cough and congestion.      ED Prescriptions    None     PDMP not reviewed this encounter.   Faustino Congress, NP 08/14/19 [phone]

## 2019-08-14 NOTE — ED Triage Notes (Signed)
Pt presents with productive cough and nasal congestion X 3 days; pt states she did have sore throat and bilateral ear pain that has subsided.

## 2019-08-14 NOTE — Discharge Instructions (Signed)
Your COVID test is pending.  You should self quarantine until the test result is back.    Take Tylenol as needed for fever or discomfort.  Rest and keep yourself hydrated.    Go to the emergency department if you develop shortness of breath, severe diarrhea, high fever not relieved by Tylenol or ibuprofen, or other concerning symptoms.    You may also take Dimetapp as needed for cough and congestion.

## 2019-09-05 ENCOUNTER — Other Ambulatory Visit: Payer: Self-pay

## 2019-09-05 ENCOUNTER — Encounter (HOSPITAL_COMMUNITY): Payer: Self-pay

## 2019-09-05 ENCOUNTER — Ambulatory Visit (HOSPITAL_COMMUNITY)
Admission: EM | Admit: 2019-09-05 | Discharge: 2019-09-05 | Disposition: A | Discharge status: Home / Self Care | Payer: Medicare Other | Attending: Urgent Care | Admitting: Urgent Care

## 2019-09-05 DIAGNOSIS — R300 Dysuria: Secondary | ICD-10-CM | POA: Diagnosis not present

## 2019-09-05 DIAGNOSIS — R739 Hyperglycemia, unspecified: Secondary | ICD-10-CM | POA: Diagnosis present

## 2019-09-05 DIAGNOSIS — R1030 Lower abdominal pain, unspecified: Secondary | ICD-10-CM | POA: Insufficient documentation

## 2019-09-05 DIAGNOSIS — M79671 Pain in right foot: Secondary | ICD-10-CM | POA: Insufficient documentation

## 2019-09-05 DIAGNOSIS — M79672 Pain in left foot: Secondary | ICD-10-CM | POA: Insufficient documentation

## 2019-09-05 LAB — POCT URINALYSIS DIP (DEVICE)
Bilirubin Urine: NEGATIVE
Glucose, UA: 100 mg/dL — AB
Hgb urine dipstick: NEGATIVE
Ketones, ur: NEGATIVE mg/dL
Leukocytes,Ua: NEGATIVE
Nitrite: NEGATIVE
Protein, ur: NEGATIVE mg/dL
Specific Gravity, Urine: 1.02 (ref 1.005–1.030)
Urobilinogen, UA: 0.2 mg/dL (ref 0.0–1.0)
pH: 7 (ref 5.0–8.0)

## 2019-09-05 LAB — CBG MONITORING, ED: Glucose-Capillary: 97 mg/dL (ref 70–99)

## 2019-09-05 MED ORDER — NAPROXEN 500 MG PO TABS: 500.0000 mg | ORAL_TABLET | Freq: Two times a day (BID) | ORAL | 0 refills | Status: DC

## 2019-09-05 NOTE — Discharge Instructions (Addendum)
For diabetes or elevated blood sugar, please make sure you are avoiding starchy, carbohydrate foods like pasta, breads, pastry, rice, potatoes, desserts. These foods can elevated your blood sugar. Also, avoid sodas, sweet teas, sugary beverages, fruit juices.  Drinking plain water will be much more helpful, try 64 ounces of water daily.  It is okay to flavor your water naturally by cutting cucumber or lemon and mint or lime, placing it in a picture with water and drinking it over a period of 2 to 3 days as long as it remains refrigerated.    For elevated blood pressure, make sure you are monitoring salt in your diet.  Do not eat restaurant foods and limit processed foods at home, prepare/cook your own foods at home.  Processed foods include things like frozen meals preseasoned meats and dinners, deli meats, canned foods as they are high in sodium/salt.  Make sure your pain attention to sodium labels on foods you by at the grocery store.  For seasoning you can use a brand called Mrs. Dash which includes a lot of salt free seasonings.  Salads - kale, spinach, cabbage, spring mix; use seeds like pumpkin seeds or sunflower seeds, almonds, walnuts or pecans; you can also use 1-2 hard boiled eggs in your salads Fruits - avocadoes, berries (blueberries, raspberries, blackberries), apples, oranges Vegetables - aspargus, cauliflower, broccoli, green beans, brussel spouts, bell peppers; stay away from starchy vegetables like potatoes, carrots, peas  Regarding meat it is better to eat lean meats and limit your red meat consumption including pork.  Wild caught fish, chicken breast are good options.  DO NOT EAT ANY FOODS ON THIS LIST THAT YOU ARE ALLERGIC TO.

## 2019-09-05 NOTE — ED Provider Notes (Signed)
[place]   MRN: [id number] DOB: March 09, 1981  Subjective:   [name] is a 39 y.o. female presenting for 3 day hx of lower abdominal pain, dysuria. Drinks mostly water, has some sweet tea, green tea. Has had slight vaginal discharge.  Has a hx of dysmenorrhea, "tumors", had partial hysterectomy. Has family hx of diabetes in her father. Patient is sexually active, has 1 female partner/husband, does not use condoms. Patient admit she has cheated on him previously.  She is okay with getting tested.  Patient also admits that she uses lime juice over her genital area and inner thighs to prevent bumps as she shaves.  Denies rash.  No current facility-administered medications for this encounter.  Current Outpatient Medications:  .  citalopram (CELEXA) 20 MG tablet, Take 20 mg by mouth daily., Disp: , Rfl:    Allergies  Allergen Reactions  . Amoxicillin Anaphylaxis and Hives    Has patient had a PCN reaction causing immediate rash, facial/tongue/throat swelling, SOB or lightheadedness with hypotension: Yes Has patient had a PCN reaction causing severe rash involving mucus membranes or skin necrosis: Yes Has patient had a PCN reaction that required hospitalization No Has patient had a PCN reaction occurring within the last 10 years: No If all of the above answers are "NO", then may proceed with Cephalosporin use.   . Imitrex [Sumatriptan Base] Anaphylaxis and Hives  . Penicillins Anaphylaxis and Hives    Has patient had a PCN reaction causing immediate rash, facial/tongue/throat swelling, SOB or lightheadedness with hypotension: Yes Has patient had a PCN reaction causing severe rash involving mucus membranes or skin necrosis: Yes Has patient had a PCN reaction that required hospitalization No Has patient had a PCN reaction occurring within the last 10 years: No If all of the above answers are "NO", then may proceed with Cephalosporin use.   . Tramadol Shortness Of Breath and  Palpitations  . Norco [Hydrocodone-Acetaminophen] Other (See Comments)    Hallucinations   . Percocet [Oxycodone-Acetaminophen] Other (See Comments)    Hallucinations   . Soy Allergy Hives    Past Medical History:  Diagnosis Date  . Anemia    h/o  . Anginal pain ([place])    pt. takes nitrostat- as needed, hasn't had since 04/2012, sees Dr. Terrence Dupont, last ekg was /w PCP- Avbere  . Anxiety   . Asthma   . Borderline personality disorder ([place])   . Chiari malformation   . Depression   . GERD (gastroesophageal reflux disease)   . Headache(784.0)   . Meningitis    Oct. 2013  . Neuromuscular disorder ([place])    muscle spasms-back & arms      Past Surgical History:  Procedure Laterality Date  . ABDOMINAL HYSTERECTOMY    . PERIPHERALLY INSERTED CENTRAL CATHETER INSERTION     during event of Meningitis- then d/c'd to home /w & treated /w antibiotics   . SUBOCCIPITAL CRANIECTOMY CERVICAL LAMINECTOMY N/A 07/22/2012   Procedure: SUBOCCIPITAL CRANIECTOMY CERVICAL LAMINECTOMY/DURAPLASTY;  Surgeon: Hosie Spangle, MD;  Location: [facility];  Service: Neurosurgery;  Laterality: N/A;  Suboccipital craniectomy with upper cervial laminectomy with duroplasty   . TUBAL LIGATION    . VAGINAL DELIVERY     x3  . WISDOM TOOTH EXTRACTION      Family History  Problem Relation Age of Onset  . Heart attack Mother   . Healthy Father     Social History   Tobacco Use  . Smoking status: Never Smoker  .  Smokeless tobacco: Never Used  Substance Use Topics  . Alcohol use: No  . Drug use: No    ROS   Objective:   Vitals: BP 108/69 (BP Location: Right Arm)   Pulse 78   Temp 98.4 F (36.9 C) (Oral)   Resp 18   SpO2 98%   Physical Exam Constitutional:      General: She is not in acute distress.    Appearance: Normal appearance. She is well-developed. She is not ill-appearing, toxic-appearing or diaphoretic.  HENT:     Head: Normocephalic and atraumatic.     Nose: Nose normal.      Mouth/Throat:     Mouth: Mucous membranes are moist.  Eyes:     Extraocular Movements: Extraocular movements intact.     Pupils: Pupils are equal, round, and reactive to light.  Cardiovascular:     Rate and Rhythm: Normal rate and regular rhythm.     Pulses: Normal pulses.     Heart sounds: Normal heart sounds. No murmur. No friction rub. No gallop.   Pulmonary:     Effort: Pulmonary effort is normal. No respiratory distress.     Breath sounds: Normal breath sounds. No stridor. No wheezing, rhonchi or rales.  Abdominal:     Tenderness: There is abdominal tenderness in the right lower quadrant, suprapubic area and left lower quadrant.  Skin:    General: Skin is warm and dry.     Findings: No rash.  Neurological:     Mental Status: She is alert and oriented to person, place, and time.  Psychiatric:        Mood and Affect: Mood normal.        Behavior: Behavior normal.        Thought Content: Thought content normal.     Results for orders placed or performed during the hospital encounter of 09/05/19 (from the past 24 hour(s))  POCT urinalysis dip (device)     Status: Abnormal   Collection Time: 09/05/19 11:40 AM  Result Value Ref Range   Glucose, UA 100 (A) NEGATIVE mg/dL   Bilirubin Urine NEGATIVE NEGATIVE   Ketones, ur NEGATIVE NEGATIVE mg/dL   Specific Gravity, Urine 1.020 1.005 - 1.030   Hgb urine dipstick NEGATIVE NEGATIVE   pH 7.0 5.0 - 8.0   Protein, ur NEGATIVE NEGATIVE mg/dL   Urobilinogen, UA 0.2 0.0 - 1.0 mg/dL   Nitrite NEGATIVE NEGATIVE   Leukocytes,Ua NEGATIVE NEGATIVE  POC CBG monitoring     Status: None   Collection Time: 09/05/19 12:05 PM  Result Value Ref Range   Glucose-Capillary 97 70 - 99 mg/dL    Assessment and Plan :   PDMP not reviewed this encounter.  1. Lower abdominal pain   2. Dysuria   3. Elevated blood sugar   4. Bilateral foot pain     Recommended patient stop using lime juice over genital area and inner thighs.  She is to start  hydrating with plain water and avoid urinary irritants such as sweet tea and green tea.  Labs pending prior urine culture, STI.  Use naproxen for pain.  Given patient's numerous concerns, recommend she follow-up closely with her PCP.  Counseled patient on potential for adverse effects with medications prescribed/recommended today, ER and return-to-clinic precautions discussed, patient verbalized understanding.    Jaynee Eagles, PA-C 09/05/19 1250

## 2019-09-05 NOTE — ED Triage Notes (Signed)
Pt reports abdominal pain and burning when urinating x 3 days. Pt has not take any medication.

## 2019-09-05 NOTE — ED Notes (Signed)
Cervicovaginal order placed and completed. I sent to main lab for processing.

## 2019-09-06 LAB — URINE CULTURE: Culture: NO GROWTH

## 2019-09-08 LAB — CERVICOVAGINAL ANCILLARY ONLY
Bacterial Vaginitis (gardnerella): NEGATIVE
Candida Glabrata: NEGATIVE
Candida Vaginitis: NEGATIVE
Chlamydia: NEGATIVE
Comment: NEGATIVE
Comment: NEGATIVE
Comment: NEGATIVE
Comment: NEGATIVE
Comment: NEGATIVE
Comment: NORMAL
Neisseria Gonorrhea: NEGATIVE
Trichomonas: NEGATIVE

## 2019-09-10 ENCOUNTER — Other Ambulatory Visit: Payer: Self-pay

## 2019-09-10 ENCOUNTER — Ambulatory Visit (HOSPITAL_COMMUNITY)
Admission: EM | Admit: 2019-09-10 | Discharge: 2019-09-10 | Disposition: A | Discharge status: Home / Self Care | Payer: Medicare Other | Attending: Family Medicine | Admitting: Family Medicine

## 2019-09-10 ENCOUNTER — Encounter (HOSPITAL_COMMUNITY): Payer: Self-pay

## 2019-09-10 DIAGNOSIS — L299 Pruritus, unspecified: Secondary | ICD-10-CM | POA: Diagnosis present

## 2019-09-10 LAB — COMPREHENSIVE METABOLIC PANEL
ALT: 14 U/L (ref 0–44)
AST: 20 U/L (ref 15–41)
Albumin: 3.8 g/dL (ref 3.5–5.0)
Alkaline Phosphatase: 71 U/L (ref 38–126)
Anion gap: 11 (ref 5–15)
BUN: 5 mg/dL — ABNORMAL LOW (ref 6–20)
CO2: 20 mmol/L — ABNORMAL LOW (ref 22–32)
Calcium: 9.2 mg/dL (ref 8.9–10.3)
Chloride: 104 mmol/L (ref 98–111)
Creatinine, Ser: 0.57 mg/dL (ref 0.44–1.00)
GFR calc Af Amer: >60 mL/min (ref >60)
GFR calc non Af Amer: >60 mL/min (ref >60)
Glucose, Bld: 84 mg/dL (ref 70–99)
Potassium: 3.8 mmol/L (ref 3.5–5.1)
Sodium: 135 mmol/L (ref 135–145)
Total Bilirubin: 0.5 mg/dL (ref 0.3–1.2)
Total Protein: 7.4 g/dL (ref 6.5–8.1)

## 2019-09-10 LAB — CBC WITH DIFFERENTIAL/PLATELET
Abs Immature Granulocytes: 0.02 10*3/uL (ref 0.00–0.07)
Basophils Absolute: 0.1 10*3/uL (ref 0.0–0.1)
Basophils Relative: 1 %
Eosinophils Absolute: 0.2 10*3/uL (ref 0.0–0.5)
Eosinophils Relative: 4 %
HCT: 38.6 % (ref 36.0–46.0)
Hemoglobin: 12.8 g/dL (ref 12.0–15.0)
Immature Granulocytes: 0 %
Lymphocytes Relative: 45 %
Lymphs Abs: 2.2 10*3/uL (ref 0.7–4.0)
MCH: 29.3 pg (ref 26.0–34.0)
MCHC: 33.2 g/dL (ref 30.0–36.0)
MCV: 88.3 fL (ref 80.0–100.0)
Monocytes Absolute: 0.4 10*3/uL (ref 0.1–1.0)
Monocytes Relative: 8 %
Neutro Abs: 2.1 10*3/uL (ref 1.7–7.7)
Neutrophils Relative %: 42 %
Platelets: 289 10*3/uL (ref 150–400)
RBC: 4.37 MIL/uL (ref 3.87–5.11)
RDW: 12.2 % (ref 11.5–15.5)
WBC: 4.9 10*3/uL (ref 4.0–10.5)
nRBC: 0 % (ref 0.0–0.2)

## 2019-09-10 NOTE — ED Triage Notes (Signed)
Patient reports itching all over her skin. Also reports back pain.

## 2019-09-10 NOTE — Discharge Instructions (Addendum)
We will check some blood work and call you with any abnormal results. You can do Benadryl as needed for itching. Follow up as needed for continued or worsening symptoms

## 2019-09-11 ENCOUNTER — Telehealth (HOSPITAL_COMMUNITY): Payer: Self-pay | Admitting: Orthopedic Surgery

## 2019-09-11 NOTE — Telephone Encounter (Signed)
Per Dr. Meda Coffee. Labs within normal limits.

## 2019-09-11 NOTE — ED Provider Notes (Signed)
[place]    CSN: [id number] Arrival date & time: 09/10/19  1215      History   Chief Complaint Chief Complaint  Patient presents with  . Pruritis  . Back Pain    HPI [name] is a 39 y.o. female.   Patient is a 39 year old female with past medical history of anemia, anxiety, asthma, borderline personality disorder, Chiari malformation, depression, GERD, headache, meningitis, neuromuscular disorder.  She presents today with generalized itching.  This has been present for the past day or so.  Denies any specific rash associated with the itching.  Symptoms have been constant.  Denies any fever, joint pain. Denies any recent changes in lotions, detergents, foods or other possible irritants. No recent travel. Nobody else at home has the rash. Patient has been outside but denies any contact with plants or insects. No new foods or medications.   ROS per HPI        Past Medical History:  Diagnosis Date  . Anemia    h/o  . Anginal pain ([place])    pt. takes nitrostat- as needed, hasn't had since 04/2012, sees Dr. Terrence Dupont, last ekg was /w PCP- [facility]bere  . Anxiety   . Asthma   . Borderline personality disorder ([place])   . Chiari malformation   . Depression   . GERD (gastroesophageal reflux disease)   . Headache(784.0)   . Meningitis    Oct. 2013  . Neuromuscular disorder ([place])    muscle spasms-back & arms     Patient Active Problem List   Diagnosis Date Noted  . Major depressive disorder, recurrent episode, mild ([place]) 04/20/2018  . SVT (supraventricular tachycardia) ([place]) 01/22/2012  . E coli bacteremia 01/22/2012  . Sepsis(995.91) 01/20/2012  . UTI (lower urinary tract infection) 01/20/2012  . Arnold-Chiari malformation, type I ([place]) 01/20/2012  . Headache(784.0) 01/19/2012    Past Surgical History:  Procedure Laterality Date  . ABDOMINAL HYSTERECTOMY    . PERIPHERALLY INSERTED CENTRAL CATHETER INSERTION     during event of Meningitis- then  d/c'd to home /w & treated /w antibiotics   . SUBOCCIPITAL CRANIECTOMY CERVICAL LAMINECTOMY N/A 07/22/2012   Procedure: SUBOCCIPITAL CRANIECTOMY CERVICAL LAMINECTOMY/DURAPLASTY;  Surgeon: Hosie Spangle, MD;  Location: [facility];  Service: Neurosurgery;  Laterality: N/A;  Suboccipital craniectomy with upper cervial laminectomy with duroplasty   . TUBAL LIGATION    . VAGINAL DELIVERY     x3  . WISDOM TOOTH EXTRACTION      OB History   No obstetric history on file.      Home Medications    Prior to Admission medications   Medication Sig Start Date End Date Taking? Authorizing Provider  citalopram (CELEXA) 20 MG tablet Take 20 mg by mouth daily. 07/04/19   [provider]  naproxen (NAPROSYN) 500 MG tablet Take 1 tablet (500 mg total) by mouth 2 (two) times daily with a meal. 09/05/19   Jaynee Eagles, PA-C  fexofenadine (ALLEGRA) 180 MG tablet Take 180 mg by mouth daily.  09/05/19  [provider]    Family History Family History  Problem Relation Age of Onset  . Heart attack Mother   . Healthy Father     Social History Social History   Tobacco Use  . Smoking status: Never Smoker  . Smokeless tobacco: Never Used  Substance Use Topics  . Alcohol use: No  . Drug use: No     Allergies   Amoxicillin, Imitrex [sumatriptan base], Penicillins, Tramadol, Norco [  hydrocodone-acetaminophen], Percocet [oxycodone-acetaminophen], and Soy allergy   Review of Systems Review of Systems   Physical Exam Triage Vital Signs ED Triage Vitals [09/10/19 1240]  Enc Vitals Group     BP 113/73     Pulse Rate 90     Resp 14     Temp 98.6 F (37 C)     Temp Source Oral     SpO2 96 %     Weight      Height      Head Circumference      Peak Flow      Pain Score 2     Pain Loc      Pain Edu?      Excl. in [place]?    No data found.  Updated Vital Signs BP 113/73 (BP Location: Right Arm)   Pulse 90   Temp 98.6 F (37 C) (Oral)   Resp 14   SpO2 96%   Visual  Acuity Right Eye Distance:   Left Eye Distance:   Bilateral Distance:    Right Eye Near:   Left Eye Near:    Bilateral Near:     Physical Exam Vitals and nursing note reviewed.  Constitutional:      General: She is not in acute distress.    Appearance: Normal appearance. She is not ill-appearing, toxic-appearing or diaphoretic.  HENT:     Head: Normocephalic.     Nose: Nose normal.     Mouth/Throat:     Pharynx: Oropharynx is clear.  Eyes:     Conjunctiva/sclera: Conjunctivae normal.  Pulmonary:     Effort: Pulmonary effort is normal.  Musculoskeletal:        General: Normal range of motion.     Cervical back: Normal range of motion.  Skin:    General: Skin is warm and dry.     Findings: No rash.     Comments: Excoriations generalized forearm itching but no notable rash to skin   Neurological:     Mental Status: She is alert.  Psychiatric:        Mood and Affect: Mood normal.      UC Treatments / Results  Labs (all labs ordered are listed, but only abnormal results are displayed) Labs Reviewed  COMPREHENSIVE METABOLIC PANEL - Abnormal; Notable for the following components:      Result Value   CO2 20 (*)    BUN 5 (*)    All other components within normal limits  CBC WITH DIFFERENTIAL/PLATELET    EKG   Radiology No results found.  Procedures Procedures (including critical care time)  Medications Ordered in UC Medications - No data to display  Initial Impression / Assessment and Plan / UC Course  I have reviewed the triage vital signs and the nursing notes.  Pertinent labs & imaging results that were available during my care of the patient were reviewed by me and considered in my medical decision making (see chart for details).     Pruritus No noticeable rash. General discolorations from itching.  Dry appearing skin. The itching could be from dry skin.  Recommended Benadryl as needed and moisturizing skin We will do some basic lab work to rule out  any other underlying etiology causing the itching. Recommend follow-up with her primary care for continued or worsening problems  Blood work unremarkable Final Clinical Impressions(s) / UC Diagnoses   Final diagnoses:  Pruritus     Discharge Instructions     We will check some  blood work and call you with any abnormal results. You can do Benadryl as needed for itching. Follow up as needed for continued or worsening symptoms     ED Prescriptions    None     PDMP not reviewed this encounter.   Loura Halt A, NP 09/11/19 1002

## 2019-10-01 ENCOUNTER — Other Ambulatory Visit: Payer: Self-pay

## 2019-10-01 ENCOUNTER — Encounter (HOSPITAL_COMMUNITY): Payer: Self-pay | Admitting: Emergency Medicine

## 2019-10-01 ENCOUNTER — Ambulatory Visit (HOSPITAL_COMMUNITY)
Admission: EM | Admit: 2019-10-01 | Discharge: 2019-10-01 | Disposition: A | Discharge status: Home / Self Care | Payer: Medicare Other | Attending: Family Medicine | Admitting: Family Medicine

## 2019-10-01 DIAGNOSIS — R202 Paresthesia of skin: Secondary | ICD-10-CM

## 2019-10-01 MED ORDER — PREDNISONE 10 MG (21) PO TBPK: ORAL_TABLET | Freq: Every day | ORAL | 0 refills | Status: AC

## 2019-10-01 NOTE — Discharge Instructions (Signed)
I have sent in steroids for you to take for the next 6 days  This is likely a nerve inflammation   If these medications are not effective, follow up with neurology

## 2019-10-01 NOTE — ED Triage Notes (Signed)
Pt sts bilateral wrist and hand pain that is burning and upper back soreness; pt sts she is borderline diabetic

## 2019-10-01 NOTE — ED Provider Notes (Signed)
[place]   [id number] 10/01/19 Arrival Time: 1106  [id number] PAIN  SUBJECTIVE: History from: patient. [name] is a 39 y.o. female complains of bilateral hand and arm tingling as well as her spine. Denies a precipitating event or specific injury. Does have history of Chiari Malformation. Has tried OTC medications without relief. Has also tried gabapentin without relief. There are not aggravating or alleviating symptoms. Denies similar symptoms in the past.  Denies fever, chills, erythema, ecchymosis, effusion, weakness, saddle paresthesias, loss of bowel or bladder function.      ROS: As per HPI.  All other pertinent ROS negative.     Past Medical History:  Diagnosis Date  . Anemia    h/o  . Anginal pain ([place])    pt. takes nitrostat- as needed, hasn't had since 04/2012, sees Dr. Terrence Dupont, last ekg was /w PCP- [facility]re  . Anxiety   . Asthma   . Borderline personality disorder ([place])   . Chiari malformation   . Depression   . GERD (gastroesophageal reflux disease)   . Headache(784.0)   . Meningitis    Oct. 2013  . Neuromuscular disorder ([place])    muscle spasms-back & arms    Past Surgical History:  Procedure Laterality Date  . ABDOMINAL HYSTERECTOMY    . PERIPHERALLY INSERTED CENTRAL CATHETER INSERTION     during event of Meningitis- then d/c'd to home /w & treated /w antibiotics   . SUBOCCIPITAL CRANIECTOMY CERVICAL LAMINECTOMY N/A 07/22/2012   Procedure: SUBOCCIPITAL CRANIECTOMY CERVICAL LAMINECTOMY/DURAPLASTY;  Surgeon: Hosie Spangle, MD;  Location: [facility];  Service: Neurosurgery;  Laterality: N/A;  Suboccipital craniectomy with upper cervial laminectomy with duroplasty   . TUBAL LIGATION    . VAGINAL DELIVERY     x3  . WISDOM TOOTH EXTRACTION     Allergies  Allergen Reactions  . Amoxicillin Anaphylaxis and Hives    Has patient had a PCN reaction causing immediate rash, facial/tongue/throat swelling, SOB or lightheadedness with hypotension:  Yes Has patient had a PCN reaction causing severe rash involving mucus membranes or skin necrosis: Yes Has patient had a PCN reaction that required hospitalization No Has patient had a PCN reaction occurring within the last 10 years: No If all of the above answers are "NO", then may proceed with Cephalosporin use.   . Imitrex [Sumatriptan Base] Anaphylaxis and Hives  . Penicillins Anaphylaxis and Hives    Has patient had a PCN reaction causing immediate rash, facial/tongue/throat swelling, SOB or lightheadedness with hypotension: Yes Has patient had a PCN reaction causing severe rash involving mucus membranes or skin necrosis: Yes Has patient had a PCN reaction that required hospitalization No Has patient had a PCN reaction occurring within the last 10 years: No If all of the above answers are "NO", then may proceed with Cephalosporin use.   . Tramadol Shortness Of Breath and Palpitations  . Norco [Hydrocodone-Acetaminophen] Other (See Comments)    Hallucinations   . Percocet [Oxycodone-Acetaminophen] Other (See Comments)    Hallucinations   . Soy Allergy Hives   No current facility-administered medications on file prior to encounter.   Current Outpatient Medications on File Prior to Encounter  Medication Sig Dispense Refill  . citalopram (CELEXA) 20 MG tablet Take 20 mg by mouth daily.    . naproxen (NAPROSYN) 500 MG tablet Take 1 tablet (500 mg total) by mouth 2 (two) times daily with a meal. 30 tablet 0  . [DISCONTINUED] fexofenadine (ALLEGRA) 180 MG tablet Take 180 mg by  mouth daily.     Social History   Socioeconomic History  . Marital status: Married    Spouse name: Not on file  . Number of children: Not on file  . Years of education: Not on file  . Highest education level: Not on file  Occupational History  . Not on file  Tobacco Use  . Smoking status: Never Smoker  . Smokeless tobacco: Never Used  Substance and Sexual Activity  . Alcohol use: No  . Drug use: No  .  Sexual activity: Yes    Birth control/protection: Surgical  Other Topics Concern  . Not on file  Social History Narrative  . Not on file   Social Determinants of Health   Financial Resource Strain:   . Difficulty of Paying Living Expenses:   Food Insecurity:   . Worried About Charity fundraiser in the Last Year:   . Arboriculturist in the Last Year:   Transportation Needs:   . Film/video editor (Medical):   Marland Kitchen Lack of Transportation (Non-Medical):   Physical Activity:   . Days of Exercise per Week:   . Minutes of Exercise per Session:   Stress:   . Feeling of Stress :   Social Connections:   . Frequency of Communication with Friends and Family:   . Frequency of Social Gatherings with Friends and Family:   . Attends Religious Services:   . Active Member of Clubs or Organizations:   . Attends Archivist Meetings:   Marland Kitchen Marital Status:   Intimate Partner Violence:   . Fear of Current or Ex-Partner:   . Emotionally Abused:   Marland Kitchen Physically Abused:   . Sexually Abused:    Family History  Problem Relation Age of Onset  . Heart attack Mother   . Healthy Father     OBJECTIVE:  Vitals:   10/01/19 1128  BP: 110/71  Pulse: 77  Resp: 18  Temp: 98.5 F (36.9 C)  TempSrc: Oral  SpO2: 99%    General appearance: ALERT; in no acute distress.  Head: NCAT Lungs: Normal respiratory effort CV: radial pulses 2+ bilaterally. Cap refill < 2 seconds Musculoskeletal:  Inspection: Skin warm, dry, clear and intact without obvious erythema, effusion, or ecchymosis.  Palpation: Nontender to palpation ROM: FROM active and passive Skin: warm and dry Neurologic: Ambulates without difficulty; Sensation intact about the upper/ lower extremities Psychological: alert and cooperative; normal mood and affect  DIAGNOSTIC STUDIES:  No results found.   ASSESSMENT & PLAN:  1. Paresthesia of both hands      Meds ordered this encounter  Medications  . predniSONE (STERAPRED  UNI-PAK 21 TAB) 10 MG (21) TBPK tablet    Sig: Take by mouth daily for 6 days. Take 6 tablets on day 1, 5 tablets on day 2, 4 tablets on day 3, 3 tablets on day 4, 2 tablets on day 5, 1 tablet on day 6    Dispense:  21 tablet    Refill:  0    Order Specific Question:   Supervising Provider    Answer:   Chase Picket [id number]     Parasthesia of Both Hands ? Related to chiari malformation ? Neuropathy Prescribed prednisone  Take as directed and to completion Continue conservative management of rest, ice, and gentle stretches Follow up with neurology as needed Follow up with PCP if symptoms persist Return or go to the ER if you have any new or worsening symptoms (fever, chills,  chest pain, abdominal pain, changes in bowel or bladder habits, pain radiating into lower legs)   Reviewed expectations re: course of current medical issues. Questions answered. Outlined signs and symptoms indicating need for more acute intervention. Patient verbalized understanding. After Visit Summary given.       Faustino Congress, NP 10/01/19 1505

## 2020-01-17 ENCOUNTER — Ambulatory Visit (HOSPITAL_COMMUNITY)
Admission: EM | Admit: 2020-01-17 | Discharge: 2020-01-17 | Disposition: A | Discharge status: Home / Self Care | Payer: Medicare Other | Attending: Emergency Medicine | Admitting: Emergency Medicine

## 2020-01-17 ENCOUNTER — Encounter (HOSPITAL_COMMUNITY): Payer: Self-pay

## 2020-01-17 ENCOUNTER — Other Ambulatory Visit: Payer: Self-pay

## 2020-01-17 DIAGNOSIS — Z20822 Contact with and (suspected) exposure to covid-19: Secondary | ICD-10-CM | POA: Insufficient documentation

## 2020-01-17 DIAGNOSIS — R5383 Other fatigue: Secondary | ICD-10-CM | POA: Diagnosis not present

## 2020-01-17 LAB — POCT URINALYSIS DIPSTICK, ED / UC
Bilirubin Urine: NEGATIVE
Glucose, UA: NEGATIVE mg/dL
Hgb urine dipstick: NEGATIVE
Ketones, ur: NEGATIVE mg/dL
Leukocytes,Ua: NEGATIVE
Nitrite: NEGATIVE
Protein, ur: NEGATIVE mg/dL
Specific Gravity, Urine: 1.02 (ref 1.005–1.030)
Urobilinogen, UA: 0.2 mg/dL (ref 0.0–1.0)
pH: 7 (ref 5.0–8.0)

## 2020-01-17 LAB — SARS CORONAVIRUS 2 (TAT 6-24 HRS): SARS Coronavirus 2: NEGATIVE

## 2020-01-17 LAB — CBG MONITORING, ED: Glucose-Capillary: 75 mg/dL (ref 70–99)

## 2020-01-17 NOTE — Discharge Instructions (Signed)
Your blood sugar and urine are normal.    Follow up with your primary care provider if your symptoms are not improving.

## 2020-01-17 NOTE — ED Triage Notes (Signed)
Patient presents today with extreme fatigue, low energy and loss of appetite. Had a telemed appointment with PCP and they stated that she needed to come in for lab work to have sugars checked and etc. Has hx of prediabetes.

## 2020-01-17 NOTE — ED Provider Notes (Signed)
[place]    CSN: [id number] Arrival date & time: 01/17/20  1030      History   Chief Complaint Chief Complaint  Patient presents with   Fatigue    HPI [name] is a 39 y.o. female.   Patient presents with 2 to 3-week history of fatigue and loss of appetite.  She states she spoke with her PCP and has an appointment scheduled to have lab work done.  She also reports dysuria 2 weeks ago which is resolved but she would like her urine checked today.  She also would like her blood sugar checked as she states she is "prediabetic."  She denies fever, rash, sore throat, cough, shortness of breath, vomiting, diarrhea, or other symptoms.  Patient's medical history includes depression, borderline personality disorder, meningitis, muscle spasms, Chiari malformation, GERD, anxiety, asthma, headaches, SVT, sepsis.  The history is provided by the patient.    Past Medical History:  Diagnosis Date   Anemia    h/o   Anginal pain ([place])    pt. takes nitrostat- as needed, hasn't had since 04/2012, sees Dr. Terrence Dupont, last ekg was /w PCP- Avbere   Anxiety    Asthma    Borderline personality disorder ([place])    Chiari malformation    Depression    GERD (gastroesophageal reflux disease)    Headache(784.0)    Meningitis    Oct. 2013   Neuromuscular disorder ([place])    muscle spasms-back & arms     Patient Active Problem List   Diagnosis Date Noted   Major depressive disorder, recurrent episode, mild ([place]) 04/20/2018   SVT (supraventricular tachycardia) ([place]) 01/22/2012   E coli bacteremia 01/22/2012   Sepsis(995.91) 01/20/2012   UTI (lower urinary tract infection) 01/20/2012   Arnold-Chiari malformation, type I ([place]) 01/20/2012   Headache(784.0) 01/19/2012    Past Surgical History:  Procedure Laterality Date   ABDOMINAL HYSTERECTOMY     PERIPHERALLY INSERTED CENTRAL CATHETER INSERTION     during event of Meningitis- then d/c'd to home /w & treated  /w antibiotics    SUBOCCIPITAL CRANIECTOMY CERVICAL LAMINECTOMY N/A 07/22/2012   Procedure: SUBOCCIPITAL CRANIECTOMY CERVICAL LAMINECTOMY/DURAPLASTY;  Surgeon: Hosie Spangle, MD;  Location: [facility];  Service: Neurosurgery;  Laterality: N/A;  Suboccipital craniectomy with upper cervial laminectomy with duroplasty    TUBAL LIGATION     VAGINAL DELIVERY     x3   WISDOM TOOTH EXTRACTION      OB History   No obstetric history on file.      Home Medications    Prior to Admission medications   Medication Sig Start Date End Date Taking? Authorizing Provider  citalopram (CELEXA) 20 MG tablet Take 20 mg by mouth daily. 07/04/19  Yes [provider]  cyclobenzaprine (FLEXERIL) 10 MG tablet Take 10 mg by mouth 3 (three) times daily as needed for muscle spasms.   Yes [provider]  Vitamin D, Ergocalciferol, (DRISDOL) 1.25 MG (50000 UNIT) CAPS capsule Take 50,000 Units by mouth every 7 (seven) days.   Yes [provider]  naproxen (NAPROSYN) 500 MG tablet Take 1 tablet (500 mg total) by mouth 2 (two) times daily with a meal. 09/05/19   Jaynee Eagles, PA-C  fexofenadine (ALLEGRA) 180 MG tablet Take 180 mg by mouth daily.  09/05/19  [provider]    Family History Family History  Problem Relation Age of Onset   Heart attack Mother    Healthy Father     Social  History Social History   Tobacco Use   Smoking status: Never Smoker   Smokeless tobacco: Never Used  Vaping Use   Vaping Use: Never used  Substance Use Topics   Alcohol use: No   Drug use: No     Allergies   Amoxicillin, Imitrex [sumatriptan base], Penicillins, Tramadol, Norco [hydrocodone-acetaminophen], Percocet [oxycodone-acetaminophen], and Soy allergy   Review of Systems Review of Systems  Constitutional: Positive for appetite change and fatigue. Negative for chills and fever.  HENT: Negative for ear pain and sore throat.   Eyes: Negative for pain and visual  disturbance.  Respiratory: Negative for cough and shortness of breath.   Cardiovascular: Negative for chest pain and palpitations.  Gastrointestinal: Negative for abdominal pain, diarrhea, nausea and vomiting.  Genitourinary: Negative for dysuria and hematuria.  Musculoskeletal: Negative for arthralgias and back pain.  Skin: Negative for color change and rash.  Neurological: Negative for seizures and syncope.  All other systems reviewed and are negative.    Physical Exam Triage Vital Signs ED Triage Vitals  Enc Vitals Group     BP      Pulse      Resp      Temp      Temp src      SpO2      Weight      Height      Head Circumference      Peak Flow      Pain Score      Pain Loc      Pain Edu?      Excl. in [place]?    No data found.  Updated Vital Signs BP 111/66    Pulse 63    Temp 99.1 F (37.3 C) (Oral)    Resp 16    SpO2 100%   Visual Acuity Right Eye Distance:   Left Eye Distance:   Bilateral Distance:    Right Eye Near:   Left Eye Near:    Bilateral Near:     Physical Exam Vitals and nursing note reviewed.  Constitutional:      General: She is not in acute distress.    Appearance: She is well-developed. She is not ill-appearing.  HENT:     Head: Normocephalic and atraumatic.     Right Ear: Tympanic membrane normal.     Left Ear: Tympanic membrane normal.     Nose: Nose normal.     Mouth/Throat:     Mouth: Mucous membranes are moist.     Pharynx: Oropharynx is clear.  Eyes:     Conjunctiva/sclera: Conjunctivae normal.  Cardiovascular:     Rate and Rhythm: Normal rate and regular rhythm.     Heart sounds: No murmur heard.   Pulmonary:     Effort: Pulmonary effort is normal. No respiratory distress.     Breath sounds: Normal breath sounds.  Abdominal:     Palpations: Abdomen is soft.     Tenderness: There is no abdominal tenderness. There is no guarding or rebound.  Musculoskeletal:     Cervical back: Neck supple.  Skin:    General: Skin is warm  and dry.     Findings: No rash.  Neurological:     General: No focal deficit present.     Mental Status: She is alert and oriented to person, place, and time.     Gait: Gait normal.  Psychiatric:        Mood and Affect: Mood normal.  Behavior: Behavior normal.      UC Treatments / Results  Labs (all labs ordered are listed, but only abnormal results are displayed) Labs Reviewed  SARS CORONAVIRUS 2 (TAT 6-24 HRS)  POCT URINALYSIS DIPSTICK, ED / UC  CBG MONITORING, ED    EKG   Radiology No results found.  Procedures Procedures (including critical care time)  Medications Ordered in UC Medications - No data to display  Initial Impression / Assessment and Plan / UC Course  I have reviewed the triage vital signs and the nursing notes.  Pertinent labs & imaging results that were available during my care of the patient were reviewed by me and considered in my medical decision making (see chart for details).   Fatigue.  CBG 75.  Urine normal.  Patient is well-appearing and her exam is reassuring.  She states she has an appointment with her PCP scheduled for next month.  PCR COVID pending.  Instructed patient to self quarantine until the test results are back.  Instructed patient to follow-up with her PCP sooner if her symptoms are not improving.  Patient agrees to plan of care.   Final Clinical Impressions(s) / UC Diagnoses   Final diagnoses:  Fatigue, unspecified type     Discharge Instructions     Your blood sugar and urine are normal.    Follow up with your primary care provider if your symptoms are not improving.       ED Prescriptions    None     PDMP not reviewed this encounter.   Sharion Balloon, NP 01/17/20 [phone]

## 2020-06-05 ENCOUNTER — Other Ambulatory Visit: Payer: Self-pay

## 2020-06-05 ENCOUNTER — Ambulatory Visit (HOSPITAL_COMMUNITY)
Admission: EM | Admit: 2020-06-05 | Discharge: 2020-06-05 | Disposition: A | Discharge status: Home / Self Care | Payer: Medicare Other | Attending: Student | Admitting: Student

## 2020-06-05 ENCOUNTER — Encounter (HOSPITAL_COMMUNITY): Payer: Self-pay

## 2020-06-05 DIAGNOSIS — N898 Other specified noninflammatory disorders of vagina: Secondary | ICD-10-CM

## 2020-06-05 DIAGNOSIS — B3731 Acute candidiasis of vulva and vagina: Secondary | ICD-10-CM

## 2020-06-05 DIAGNOSIS — R7303 Prediabetes: Secondary | ICD-10-CM | POA: Diagnosis present

## 2020-06-05 DIAGNOSIS — B373 Candidiasis of vulva and vagina: Secondary | ICD-10-CM | POA: Insufficient documentation

## 2020-06-05 DIAGNOSIS — R300 Dysuria: Secondary | ICD-10-CM

## 2020-06-05 DIAGNOSIS — Z113 Encounter for screening for infections with a predominantly sexual mode of transmission: Secondary | ICD-10-CM

## 2020-06-05 DIAGNOSIS — Z3202 Encounter for pregnancy test, result negative: Secondary | ICD-10-CM | POA: Diagnosis not present

## 2020-06-05 LAB — POCT URINALYSIS DIPSTICK, ED / UC
Bilirubin Urine: NEGATIVE
Glucose, UA: NEGATIVE mg/dL
Hgb urine dipstick: NEGATIVE
Ketones, ur: NEGATIVE mg/dL
Leukocytes,Ua: NEGATIVE
Nitrite: NEGATIVE
Protein, ur: NEGATIVE mg/dL
Specific Gravity, Urine: 1.025 (ref 1.005–1.030)
Urobilinogen, UA: 0.2 mg/dL (ref 0.0–1.0)
pH: 7.5 (ref 5.0–8.0)

## 2020-06-05 LAB — CBG MONITORING, ED: Glucose-Capillary: 86 mg/dL (ref 70–99)

## 2020-06-05 LAB — HIV ANTIBODY (ROUTINE TESTING W REFLEX): HIV Screen 4th Generation wRfx: NONREACTIVE

## 2020-06-05 LAB — POC URINE PREG, ED: Preg Test, Ur: NEGATIVE

## 2020-06-05 MED ORDER — FLUCONAZOLE 150 MG PO TABS: 150.0000 mg | ORAL_TABLET | Freq: Every day | ORAL | 0 refills | Status: AC

## 2020-06-05 NOTE — ED Provider Notes (Signed)
[place]    CSN: [id number] Arrival date & time: 06/05/20  1001      History   Chief Complaint Chief Complaint  Patient presents with  . Dysuria  . Vaginal Itching    HPI [name] is a 40 y.o. female presenting with dysuria and vaginal itching. Also mentions she is a diet-controlled prediabetic. History SVT, sepsis, UTI, Chari malformation, headache, GERD, meningitis, neuromuscular disorder, depression, BPD, anxiety, asthma, prediabetes. -Pt c/o urinary frequency, urgency, burning with urination, fatigue, vaginal itching, white vag discharge for approx 1 week. She has been using new floral vaginal wash. Denies abdominal/flank/back pain, n/v/d, fever, odor to vaginal discharge. Took AZO yesterday with minimal improvement.  Denies sti risk. -Pt has not been checking her glucose levels at home, states she has been diagnosed with "pre-diabetes" but not taking any Rx for it. Prediabetes is diet controlled.      HPI  Past Medical History:  Diagnosis Date  . Anemia    h/o  . Anginal pain ([place])    pt. takes nitrostat- as needed, hasn't had since 04/2012, sees Dr. Terrence Dupont, last ekg was /w PCP- [facility]  . Anxiety   . Asthma   . Borderline personality disorder ([place])   . Chiari malformation   . Depression   . GERD (gastroesophageal reflux disease)   . Headache(784.0)   . Meningitis    Oct. 2013  . Neuromuscular disorder ([place])    muscle spasms-back & arms     Patient Active Problem List   Diagnosis Date Noted  . Major depressive disorder, recurrent episode, mild ([place]) 04/20/2018  . SVT (supraventricular tachycardia) ([place]) 01/22/2012  . E coli bacteremia 01/22/2012  . Sepsis(995.91) 01/20/2012  . UTI (lower urinary tract infection) 01/20/2012  . Arnold-Chiari malformation, type I ([place]) 01/20/2012  . Headache(784.0) 01/19/2012    Past Surgical History:  Procedure Laterality Date  . ABDOMINAL HYSTERECTOMY    . PERIPHERALLY INSERTED CENTRAL CATHETER  INSERTION     during event of Meningitis- then d/c'd to home /w & treated /w antibiotics   . SUBOCCIPITAL CRANIECTOMY CERVICAL LAMINECTOMY N/A 07/22/2012   Procedure: SUBOCCIPITAL CRANIECTOMY CERVICAL LAMINECTOMY/DURAPLASTY;  Surgeon: Hosie Spangle, MD;  Location: [facility];  Service: Neurosurgery;  Laterality: N/A;  Suboccipital craniectomy with upper cervial laminectomy with duroplasty   . TUBAL LIGATION    . VAGINAL DELIVERY     x3  . WISDOM TOOTH EXTRACTION      OB History   No obstetric history on file.      Home Medications    Prior to Admission medications   Medication Sig Start Date End Date Taking? Authorizing Provider  citalopram (CELEXA) 20 MG tablet Take 20 mg by mouth daily. 07/04/19  Yes [provider]  fluconazole (DIFLUCAN) 150 MG tablet Take 1 tablet (150 mg total) by mouth daily for 2 doses. Take one pill today. If your symptoms return or persist, you can take the second pill in 3 days. 06/05/20 06/07/20 Yes Hazel Sams, PA-C  cyclobenzaprine (FLEXERIL) 10 MG tablet Take 10 mg by mouth 3 (three) times daily as needed for muscle spasms.    [provider]  naproxen (NAPROSYN) 500 MG tablet Take 1 tablet (500 mg total) by mouth 2 (two) times daily with a meal. 09/05/19   Jaynee Eagles, PA-C  Vitamin D, Ergocalciferol, (DRISDOL) 1.25 MG (50000 UNIT) CAPS capsule Take 50,000 Units by mouth every 7 (seven) days.    [provider]  fexofenadine Delma Freeze)  180 MG tablet Take 180 mg by mouth daily.  09/05/19  [provider]    Family History Family History  Problem Relation Age of Onset  . Heart attack Mother   . Healthy Father     Social History Social History   Tobacco Use  . Smoking status: Never Smoker  . Smokeless tobacco: Never Used  Vaping Use  . Vaping Use: Never used  Substance Use Topics  . Alcohol use: No  . Drug use: No     Allergies   Amoxicillin, Imitrex [sumatriptan base], Penicillins, Tramadol, Norco  [hydrocodone-acetaminophen], Percocet [oxycodone-acetaminophen], and Soy allergy   Review of Systems Review of Systems  Constitutional: Negative for appetite change, chills, diaphoresis and fever.  Respiratory: Negative for shortness of breath.   Cardiovascular: Negative for chest pain.  Gastrointestinal: Negative for abdominal pain, blood in stool, constipation, diarrhea, nausea and vomiting.  Genitourinary: Positive for dysuria, frequency and vaginal discharge. Negative for decreased urine volume, difficulty urinating, flank pain, genital sores, hematuria, menstrual problem, pelvic pain, urgency, vaginal bleeding and vaginal pain.  Musculoskeletal: Negative for back pain.  Neurological: Negative for dizziness, weakness and light-headedness.  All other systems reviewed and are negative.    Physical Exam Triage Vital Signs ED Triage Vitals [06/05/20 1019]  Enc Vitals Group     BP 105/71     Pulse Rate 76     Resp 18     Temp 98.8 F (37.1 C)     Temp Source Oral     SpO2 98 %     Weight      Height      Head Circumference      Peak Flow      Pain Score      Pain Loc      Pain Edu?      Excl. in [place]?    No data found.  Updated Vital Signs BP 105/71 (BP Location: Left Arm)   Pulse 76   Temp 98.8 F (37.1 C) (Oral)   Resp 18   SpO2 98%   Visual Acuity Right Eye Distance:   Left Eye Distance:   Bilateral Distance:    Right Eye Near:   Left Eye Near:    Bilateral Near:     Physical Exam Vitals reviewed.  Constitutional:      General: She is not in acute distress.    Appearance: Normal appearance. She is not ill-appearing.  HENT:     Head: Normocephalic and atraumatic.  Cardiovascular:     Rate and Rhythm: Normal rate and regular rhythm.     Heart sounds: Normal heart sounds.  Pulmonary:     Effort: Pulmonary effort is normal.     Breath sounds: Normal breath sounds. No wheezing, rhonchi or rales.  Abdominal:     General: Bowel sounds are normal. There is  no distension.     Palpations: Abdomen is soft. There is no mass.     Tenderness: There is no abdominal tenderness. There is no right CVA tenderness, left CVA tenderness, guarding or rebound.     Comments: Bowel sounds positive in all 4 quadrants. No tenderness to palpation. Negative Murphy Sign, Rovsing's sign, McBurney point tenderness.   Neurological:     General: No focal deficit present.     Mental Status: She is alert and oriented to person, place, and time.  Psychiatric:        Mood and Affect: Mood normal.        Behavior:  Behavior normal.      UC Treatments / Results  Labs (all labs ordered are listed, but only abnormal results are displayed) Labs Reviewed  RPR  HIV ANTIBODY (ROUTINE TESTING W REFLEX)  POCT URINALYSIS DIPSTICK, ED / UC  POC URINE PREG, ED  CBG MONITORING, ED  CERVICOVAGINAL ANCILLARY ONLY    EKG   Radiology No results found.  Procedures Procedures (including critical care time)  Medications Ordered in UC Medications - No data to display  Initial Impression / Assessment and Plan / UC Course  I have reviewed the triage vital signs and the nursing notes.  Pertinent labs & imaging results that were available during my care of the patient were reviewed by me and considered in my medical decision making (see chart for details).     For prediabetes- nonfasting CBG 86 today. Continue diet control for this. Follow with PCP for further monitoring.   UA today wnl, urine pregnancy negative. She is afebrile and nontachycardic, no abd pain or CVT. Plan to treat for candida with diflucan as below.  Will send for G/C, trich, yeast, BV, HIV, RPR. Patient declines STI risk but wishes screening. She has been using new floral vaginal wash; advised her to discontinue this.    We have sent testing for sexually transmitted infections. We will notify you of any positive results once they are received. If required, we will prescribe any medications you might  need.   Please refrain from all sexual activity for at least the next seven days.  Seek additional medical attention if you develop fevers/chills, new/worsening abdominal pain, new/worsening vaginal discomfort/discharge, etc. Patient verbalizes understanding and agreement.    Spent over 40 minutes obtaining H&P, performing physical, discussing results, treatment plan and plan for follow-up with patient. Patient agrees with plan.   This chart was dictated using voice recognition software, Dragon. Despite the best efforts of this provider to proofread and correct errors, errors may still occur which can change documentation meaning.       Final Clinical Impressions(s) / UC Diagnoses   Final diagnoses:  Vaginal candida  Routine screening for STI (sexually transmitted infection)  Prediabetes     Discharge Instructions     -For your yeast infection, take the Diflucan (fluconazole). Take one pill today. If your symptoms return or persist, you can take the second pill in 3 days. -We're also screening for gonorrhea, chlamydia, trichomonas, BV, HIV, and Syphilis. We'll call you if anything is positive, and can send additional treatment at that time.  -Your prediabetes is well controlled with your healthy diet and exercises. Continue these.  -seek immediate medical attention if chest pain, shortness of breath, new fevers, new abdominal pain or back pain, etc.     ED Prescriptions    Medication Sig Dispense Auth. Provider   fluconazole (DIFLUCAN) 150 MG tablet Take 1 tablet (150 mg total) by mouth daily for 2 doses. Take one pill today. If your symptoms return or persist, you can take the second pill in 3 days. 2 tablet Hazel Sams, PA-C     PDMP not reviewed this encounter.   Hazel Sams, PA-C 06/05/20 1119

## 2020-06-05 NOTE — ED Triage Notes (Signed)
Pt c/o urinary frequency, urgency, burning with urination, fatigue, vaginal itching, white vag discharge for approx 1 week.   Denies abdominal/flank/back pain, n/v/d, fever, odor to vaginal discharge.  Took AZO yesterday.  Pt has not been checking her glucose levels at home, states she has been diagnosed with "pre-diabetes" but not taking any Rx for it.

## 2020-06-05 NOTE — Discharge Instructions (Addendum)
-  For your yeast infection, take the Diflucan (fluconazole). Take one pill today. If your symptoms return or persist, you can take the second pill in 3 days. -We're also screening for gonorrhea, chlamydia, trichomonas, BV, HIV, and Syphilis. We'll call you if anything is positive, and can send additional treatment at that time.  -Your prediabetes is well controlled with your healthy diet and exercises. Continue these.  -seek immediate medical attention if chest pain, shortness of breath, new fevers, new abdominal pain or back pain, etc.

## 2020-06-06 LAB — RPR: RPR Ser Ql: NONREACTIVE

## 2020-06-07 ENCOUNTER — Telehealth (HOSPITAL_COMMUNITY): Payer: Self-pay | Admitting: Emergency Medicine

## 2020-06-07 LAB — CERVICOVAGINAL ANCILLARY ONLY
Bacterial Vaginitis (gardnerella): POSITIVE — AB
Candida Glabrata: NEGATIVE
Candida Vaginitis: POSITIVE — AB
Chlamydia: NEGATIVE
Comment: NEGATIVE
Comment: NEGATIVE
Comment: NEGATIVE
Comment: NEGATIVE
Comment: NEGATIVE
Comment: NORMAL
Neisseria Gonorrhea: NEGATIVE
Trichomonas: NEGATIVE

## 2020-06-07 MED ORDER — METRONIDAZOLE 500 MG PO TABS: 500.0000 mg | ORAL_TABLET | Freq: Two times a day (BID) | ORAL | 0 refills | Status: DC

## 2020-07-16 ENCOUNTER — Ambulatory Visit (INDEPENDENT_AMBULATORY_CARE_PROVIDER_SITE_OTHER): Payer: Medicare Other

## 2020-07-16 ENCOUNTER — Encounter (HOSPITAL_COMMUNITY): Payer: Self-pay | Admitting: *Deleted

## 2020-07-16 ENCOUNTER — Other Ambulatory Visit: Payer: Self-pay

## 2020-07-16 ENCOUNTER — Ambulatory Visit (HOSPITAL_COMMUNITY)
Admission: EM | Admit: 2020-07-16 | Discharge: 2020-07-16 | Disposition: A | Discharge status: Home / Self Care | Payer: Medicare Other | Attending: Medical Oncology | Admitting: Medical Oncology

## 2020-07-16 DIAGNOSIS — R059 Cough, unspecified: Secondary | ICD-10-CM | POA: Diagnosis not present

## 2020-07-16 DIAGNOSIS — R509 Fever, unspecified: Secondary | ICD-10-CM

## 2020-07-16 DIAGNOSIS — Z20822 Contact with and (suspected) exposure to covid-19: Secondary | ICD-10-CM | POA: Insufficient documentation

## 2020-07-16 DIAGNOSIS — J00 Acute nasopharyngitis [common cold]: Secondary | ICD-10-CM | POA: Insufficient documentation

## 2020-07-16 DIAGNOSIS — J45909 Unspecified asthma, uncomplicated: Secondary | ICD-10-CM | POA: Diagnosis not present

## 2020-07-16 DIAGNOSIS — R0981 Nasal congestion: Secondary | ICD-10-CM

## 2020-07-16 DIAGNOSIS — Z880 Allergy status to penicillin: Secondary | ICD-10-CM | POA: Insufficient documentation

## 2020-07-16 DIAGNOSIS — R042 Hemoptysis: Secondary | ICD-10-CM | POA: Diagnosis not present

## 2020-07-16 MED ORDER — BENZONATATE 100 MG PO CAPS: 100.0000 mg | ORAL_CAPSULE | Freq: Three times a day (TID) | ORAL | 0 refills | Status: DC

## 2020-07-16 MED ORDER — PREDNISONE 10 MG (21) PO TBPK: ORAL_TABLET | Freq: Every day | ORAL | 0 refills | Status: DC

## 2020-07-16 NOTE — ED Provider Notes (Signed)
[place]    CSN: [id number] Arrival date & time: 07/16/20  1056      History   Chief Complaint Chief Complaint  Patient presents with  . Cough  . Nasal Congestion    HPI [name] is a 40 y.o. female.   HPI  Cough: Pt notes a productive cough with associated nasal congestion for the past 3 days.  Patient reports that she has been having few episodes of hemoptysis brown or pink sputum.  States that this occurred a few years ago when she had bronchitis previously.  She denies any chest pain, shortness of breath, or other bleeding episodes of body, fevers at home.  No night sweats, unintentional weight loss or TB exposure.  She does state that both of her kids have a similar illness.  She has tried over-the-counter cough and cold medication without any relief of symptoms.  Past Medical History:  Diagnosis Date  . Anemia    h/o  . Anginal pain ([place])    pt. takes nitrostat- as needed, hasn't had since 04/2012, sees Dr. Terrence Dupont, last ekg was /w PCP- [facility]  . Anxiety   . Asthma   . Borderline personality disorder ([place])   . Chiari malformation   . Depression   . GERD (gastroesophageal reflux disease)   . Headache(784.0)   . Meningitis    Oct. 2013  . Neuromuscular disorder ([place])    muscle spasms-back & arms     Patient Active Problem List   Diagnosis Date Noted  . Major depressive disorder, recurrent episode, mild ([place]) 04/20/2018  . SVT (supraventricular tachycardia) ([place]) 01/22/2012  . E coli bacteremia 01/22/2012  . Sepsis(995.91) 01/20/2012  . UTI (lower urinary tract infection) 01/20/2012  . Arnold-Chiari malformation, type I ([place]) 01/20/2012  . Headache(784.0) 01/19/2012    Past Surgical History:  Procedure Laterality Date  . ABDOMINAL HYSTERECTOMY    . PERIPHERALLY INSERTED CENTRAL CATHETER INSERTION     during event of Meningitis- then d/c'd to home /w & treated /w antibiotics   . SUBOCCIPITAL CRANIECTOMY CERVICAL LAMINECTOMY N/A  07/22/2012   Procedure: SUBOCCIPITAL CRANIECTOMY CERVICAL LAMINECTOMY/DURAPLASTY;  Surgeon: Hosie Spangle, MD;  Location: [facility];  Service: Neurosurgery;  Laterality: N/A;  Suboccipital craniectomy with upper cervial laminectomy with duroplasty   . TUBAL LIGATION    . VAGINAL DELIVERY     x3  . WISDOM TOOTH EXTRACTION      OB History   No obstetric history on file.      Home Medications    Prior to Admission medications   Medication Sig Start Date End Date Taking? Authorizing Provider  citalopram (CELEXA) 20 MG tablet Take 20 mg by mouth daily. 07/04/19   [provider]  cyclobenzaprine (FLEXERIL) 10 MG tablet Take 10 mg by mouth 3 (three) times daily as needed for muscle spasms.    [provider]  metroNIDAZOLE (FLAGYL) 500 MG tablet Take 1 tablet (500 mg total) by mouth 2 (two) times daily. 06/07/20   Chase Picket, MD  naproxen (NAPROSYN) 500 MG tablet Take 1 tablet (500 mg total) by mouth 2 (two) times daily with a meal. 09/05/19   Jaynee Eagles, PA-C  Vitamin D, Ergocalciferol, (DRISDOL) 1.25 MG (50000 UNIT) CAPS capsule Take 50,000 Units by mouth every 7 (seven) days.    [provider]  fexofenadine (ALLEGRA) 180 MG tablet Take 180 mg by mouth daily.  09/05/19  [provider]    Family History Family History  Problem Relation Age of Onset  . Heart attack Mother   . Healthy Father     Social History Social History   Tobacco Use  . Smoking status: Never Smoker  . Smokeless tobacco: Never Used  Vaping Use  . Vaping Use: Never used  Substance Use Topics  . Alcohol use: No  . Drug use: No     Allergies   Amoxicillin, Imitrex [sumatriptan base], Penicillins, Tramadol, Norco [hydrocodone-acetaminophen], Percocet [oxycodone-acetaminophen], and Soy allergy   Review of Systems Review of Systems  As stated above in HPI Physical Exam Triage Vital Signs ED Triage Vitals [07/16/20 1114]  Enc Vitals Group     BP 105/68      Pulse Rate 82     Resp 16     Temp 99 F (37.2 C)     Temp Source Oral     SpO2 100 %     Weight      Height      Head Circumference      Peak Flow      Pain Score      Pain Loc      Pain Edu?      Excl. in [place]?    No data found.  Updated Vital Signs BP 105/68 (BP Location: Right Arm)   Pulse 82   Temp 99 F (37.2 C) (Oral)   Resp 16   SpO2 100%   Physical Exam Vitals and nursing note reviewed.  Constitutional:      General: She is not in acute distress.    Appearance: Normal appearance. She is not ill-appearing, toxic-appearing or diaphoretic.  HENT:     Head: Normocephalic and atraumatic.     Right Ear: Tympanic membrane, ear canal and external ear normal.     Left Ear: Tympanic membrane, ear canal and external ear normal.     Nose: Congestion present.     Mouth/Throat:     Mouth: Mucous membranes are moist.     Pharynx: No oropharyngeal exudate or posterior oropharyngeal erythema.     Comments: No bleeding of gums Eyes:     Extraocular Movements: Extraocular movements intact.     Pupils: Pupils are equal, round, and reactive to light.     Comments: No evidence of pallor   Cardiovascular:     Rate and Rhythm: Normal rate and regular rhythm.     Heart sounds: Normal heart sounds.  Pulmonary:     Effort: Pulmonary effort is normal. No respiratory distress.     Breath sounds: Normal breath sounds. No stridor. No wheezing, rhonchi or rales.  Chest:     Chest wall: No tenderness.  Musculoskeletal:     Cervical back: Normal range of motion and neck supple.  Lymphadenopathy:     Cervical: No cervical adenopathy.  Skin:    Coloration: Skin is not pale.  Neurological:     Mental Status: She is alert and oriented to person, place, and time.      UC Treatments / Results  Labs (all labs ordered are listed, but only abnormal results are displayed) Labs Reviewed - No data to display  EKG   Radiology No results found.  Procedures Procedures  (including critical care time)  Medications Ordered in UC Medications - No data to display  Initial Impression / Assessment and Plan / UC Course  I have reviewed the triage vital signs and the nursing notes.  Pertinent labs & imaging results that were available during my care of the  patient were reviewed by me and considered in my medical decision making (see chart for details).     New.  Cough with hemoptysis complication. Her chest x-ray is without abnormality which is comforting given her hemoptysis.  She may be having some mild hemoptysis from coughing episodes however we did discuss red flag signs and symptoms and if symptoms continue to worsen or she has any red flag symptoms that she be seen by the emergency department or her primary care provider depending on the complications which we discussed.  For now we will treat with Tessalon and prednisone to avoid further systemic complications.  Discussed how to use.  Also testing for Covid given sick contacts. Final Clinical Impressions(s) / UC Diagnoses   Final diagnoses:  None   Discharge Instructions   None    ED Prescriptions    None     PDMP not reviewed this encounter.   Hughie Closs, [place] 07/16/20 1208

## 2020-07-16 NOTE — ED Triage Notes (Signed)
Pt reports productive cough , nasal congestion.

## 2020-07-17 LAB — SARS CORONAVIRUS 2 (TAT 6-24 HRS): SARS Coronavirus 2: NEGATIVE

## 2020-07-22 ENCOUNTER — Ambulatory Visit (HOSPITAL_COMMUNITY)
Admission: RE | Admit: 2020-07-22 | Discharge: 2020-07-22 | Disposition: A | Discharge status: Home / Self Care | Payer: Medicare Other | Attending: Psychiatry | Admitting: Psychiatry

## 2020-07-22 ENCOUNTER — Other Ambulatory Visit: Payer: Self-pay

## 2020-07-22 ENCOUNTER — Ambulatory Visit (HOSPITAL_COMMUNITY)
Admission: EM | Admit: 2020-07-22 | Discharge: 2020-07-22 | Disposition: A | Discharge status: Home / Self Care | Payer: Medicare Other | Attending: Psychiatry | Admitting: Psychiatry

## 2020-07-22 DIAGNOSIS — F332 Major depressive disorder, recurrent severe without psychotic features: Secondary | ICD-10-CM | POA: Diagnosis not present

## 2020-07-22 DIAGNOSIS — F603 Borderline personality disorder: Secondary | ICD-10-CM | POA: Diagnosis not present

## 2020-07-22 DIAGNOSIS — Z555 Less than a high school diploma: Secondary | ICD-10-CM | POA: Insufficient documentation

## 2020-07-22 DIAGNOSIS — Z79899 Other long term (current) drug therapy: Secondary | ICD-10-CM | POA: Insufficient documentation

## 2020-07-22 DIAGNOSIS — R45851 Suicidal ideations: Secondary | ICD-10-CM | POA: Diagnosis present

## 2020-07-22 NOTE — ED Notes (Signed)
Pt verbalized understanding of AVS instructions reviewed by staff. Denies SI/HI/AVH. Pt escorted to lobby to await spousal transport to home. Safety maintained.

## 2020-07-22 NOTE — Discharge Instructions (Addendum)

## 2020-07-22 NOTE — BH Assessment (Addendum)
Comprehensive Clinical Assessment (CCA) Note  DISPOSITION: Walk-in at [facility] evaluated by Waylan Boga, DNP and this Clinician who determined Pt meets criteria for inpatient psychiatric treatment. Nira Conn, [facility] confirmed no appropriate bed is not currently available. Therefore, patient patient referred to the [facility] for overnight observation pending am psych evaluation and appropriate level of care to be determined and re-evaluated by provider in the am. Notified Marvia Pickles, NP of disposition recommendation and the sitter utilization recommendation.   The patient demonstrates the following risk factors for suicide: Chronic risk factors for suicide include: psychiatric disorder of Major Depressive Disorder, Recurrent, Severe and chart report indicates a history of Borderline Personality Disorder; Patient self reports a history of Borderlline Personality Disorder . Acute risk factors for suicide include: social withdrawal/isolation and stressed about school (trying to obtain her GED). Protective factors for this patient include: positive social support, positive therapeutic relationship, responsibility to others (children, family), hope for the future and life satisfaction. Considering these factors, the overall suicide risk at this point appears to be high. Patient is not appropriate for outpatient follow up.  Therefore, a 1:1 sitter for suicide precautions is recommended. The [employer], NP has been informed through secure chat due to current suicidal ideations, plan, intent, and history of suicide attempt by trying to jump off a bridge.    Flowsheet Row OP Visit from 07/22/2020 in [place] [facility] from 07/16/2020 in [facility] at [facility] ED from 06/05/2020 in [facility] [place] High Risk No Risk No Risk      [name] is an 40 y.o. female who self-reports a history of Bipolar D/O. Today she has  suicidal ideations and increased anxiety. She presented with her husband to [facility] as a walk-in after she called him stating that she was having suicidal thoughts. She also expressed to her spouse that she had a plan to overdose with intent. Patient is unable to contract for safety in today's assessment. She has a history of a suicide attempt by jumping off a bridge in 2020. Prior to that suicide attempt she hadn't experienced suicidal ideations in 7-8 yrs.  States that she doesn't want to get to that point again so she reach out for help. Current depressive symptoms include: hopelessness, lack of motivation, fatigue, and difficulty concentrating. Patient states that she can only identify school as her main trigger. She is in GED program and hopes to get through school so she can get a job. She has not worked in 2 or 3 yrs and loss her last job due to her mental health and mood swings. Appetite is fair. Sleep is poor. She has not slept no more than 4 hrs in the past 1.5 week. Denies HI and AVH's. She does not appear to be responding to internal stimuli or delusional.  Lives with her husband and 3 children, large family support system. She identifies her grandmother, brother, etc.   Agreeable to go inpatient for stabilization.However, no beds. Patient transferred to the [facility] for holding, overnight observation, am psych evaluation.   07/22/2020 Haskel Khan [id number]  Chief Complaint:  Chief Complaint  Patient presents with  . Psychiatric Evaluation   Visit Diagnosis:    CCA Screening, Triage and Referral (STR)  Patient Reported Information How did you hear about [place]? No data recorded Referral name: Self Referral [phone])  Referral phone number: No data recorded  Whom do you see for routine medical  problems? Primary Care  Practice/Facility Name: [facility]  Practice/Facility Phone Number: No data recorded Name of Contact: Phone: [phone]  Contact Number: Phone:  [phone]  Contact Fax Number: No data recorded Prescriber Name: [place] [facility]  Prescriber Address (if known): Address: [address], [place], [place] [address]   What Is the Reason for Your Visit/Call Today? PCP/Medication Management  How Long Has This Been Causing You Problems? > than 6 months  What Do You Feel Would Help You the Most Today? Treatment for Depression or other mood problem; Medication(s)   Have You Recently Been in Any Inpatient Treatment (Hospital/Detox/Crisis Center/28-Day Program)? No  Name/Location of Program/Hospital:No data recorded How Long Were You There? No data recorded When Were You Discharged? No data recorded  Have You Ever Received Services From [facility] Before? No  Who Do You See at [facility]? No data recorded  Have You Recently Had Any Thoughts About Hurting Yourself? Yes  Are You Planning to Commit Suicide/Harm Yourself At This time? Yes   Have you Recently Had Thoughts About Hurting Someone [place] Dawn? No  Explanation: No data recorded  Have You Used Any Alcohol or Drugs in the Past 24 Hours? No  How Long Ago Did You Use Drugs or Alcohol? No data recorded What Did You Use and How Much? No data recorded  Do You Currently Have a Therapist/Psychiatrist? Yes  Name of Therapist/Psychiatrist: [facility]   Have You Been Recently Discharged From Any Office Practice or Programs? No  Explanation of Discharge From Practice/Program: No data recorded    CCA Screening Triage Referral Assessment Type of Contact: Face-to-Face [facility] [place])  Is this Initial or Reassessment? Initial Assessment  Date Telepsych consult ordered in [facility]:  07/22/2020  Time Telepsych consult ordered in [facility]:  No data recorded  Patient Reported Information Reviewed? No  Patient Left Without Being Seen? No  Reason for Not Completing Assessment: No data recorded  Collateral Involvement: No data recorded  Does Patient Have a [place]? No data recorded Name and Contact of Legal Guardian: No data recorded If Minor and Not Living with Parent(s), Who has Custody? No data recorded Is CPS involved or ever been involved? Never  Is APS involved or ever been involved? Never   Patient Determined To Be At Risk for Harm To Self or Others Based on Review of Patient Reported Information or Presenting Complaint? Yes, for Self-Harm  Method: No data recorded Availability of Means: No data recorded Intent: No data recorded Notification Required: No data recorded Additional Information for Danger to Others Potential: No data recorded Additional Comments for Danger to Others Potential: No data recorded Are There Guns or Other Weapons in Your Home? No data recorded Types of Guns/Weapons: No data recorded Are These Weapons Safely Secured?                            No data recorded Who Could Verify You Are Able To Have These Secured: No data recorded Do You Have any Outstanding Charges, Pending Court Dates, Parole/Probation? No data recorded Contacted To Inform of Risk of Harm To Self or Others: No data recorded  Location of Assessment: -- [facility]   Does Patient Present under Involuntary Commitment? No  IVC Papers Initial File Date: No data recorded  [place] of Residence: [place]   Patient Currently Receiving the Following Services: Medication Management; Individual Therapy   Determination of Need: Emergent (2  hours)   Options For Referral: Inpatient Hospitalization; Medication Management     CCA Biopsychosocial Intake/Chief Complaint:  [name] is an 40 y.o. female presented with her husband after she called him telling him she was having suicidal ideations, plan to overdose.  Suicide attempt by trying to jump from a bridge in 2020 and admitted to [facility].  The 7-8 years prior to that she had suicidal ideations.  Self-report of bipolar d/o yet no notes in system of this and only  takes Celexa, diagnosis noted of borderline personality in records.  No mania symptoms.  Depression is high, some anxiety.  No panic attacks, substance use, or hallucinations.  Sleep is fair with 4-5 hours per night, appetite is "fair".  Lives with her husband and 3 children, large family support system.  Agreeable to go inpatient for stabilization.  Current Symptoms/Problems: [name] is an 40 y.o. female presented with her husband after she called him telling him she was having suicidal ideations, plan to overdose.  Suicide attempt by trying to jump from a bridge in 2020 and admitted to [facility].  The 7-8 years prior to that she had suicidal ideations.  Self-report of bipolar d/o yet no notes in system of this and only takes Celexa, diagnosis noted of borderline personality in records.  No mania symptoms.  Depression is high, some anxiety.  No panic attacks, substance use, or hallucinations.  Sleep is fair with 4-5 hours per night, appetite is "fair".  Lives with her husband and 3 children, large family support system.  Agreeable to go inpatient for stabilization.   Patient Reported Schizophrenia/Schizoaffective Diagnosis in Past: No   Strengths: seek help when needed  Preferences: "I want help, I'm getting bad"  Abilities: unknown   Type of Services Patient Feels are Needed: medication management   Initial Clinical Notes/Concerns: [name] is an 40 y.o. female presented with her husband after she called him telling him she was having suicidal ideations, plan to overdose.  Suicide attempt by trying to jump from a bridge in 2020 and admitted to [facility].  The 7-8 years prior to that she had suicidal ideations.  Self-report of bipolar d/o yet no notes in system of this and only takes Celexa, diagnosis noted of borderline personality in records.  No mania symptoms.  Depression is high, some anxiety.  No panic attacks, substance use, or hallucinations.  Sleep is fair  with 4-5 hours per night, appetite is "fair".  Lives with her husband and 3 children, large family support system.  Agreeable to go inpatient for stabilization.   Mental Health Symptoms Depression:  Difficulty Concentrating; Fatigue; Hopelessness; Change in energy/activity; Irritability; Worthlessness   Duration of Depressive symptoms: Greater than two weeks   Mania:  Change in energy/activity; Increased Energy; Irritability   Anxiety:   Difficulty concentrating; Fatigue; Worrying   Psychosis:  None   Duration of Psychotic symptoms: No data recorded  Trauma:  None   Obsessions:  None   Compulsions:  None   Inattention:  None   Hyperactivity/Impulsivity:  N/A   Oppositional/Defiant Behaviors:  None   Emotional Irregularity:  None   Other Mood/Personality Symptoms:  Depression is high, some anxiety.  No panic attacks, substance use, or hallucinations.  Sleep is fair with 4-5 hours per night, appetite is "fair".  Lives with her husband and 3 children, large family support system.  Agreeable to go inpatient for stabilization.    Mental Status Exam Appearance and self-care  Stature:  Average   Weight:  Average weight   Clothing:  Neat/clean   Grooming:  Normal   Cosmetic use:  Age appropriate   Posture/gait:  Normal   Motor activity:  Not Remarkable   Sensorium  Attention:  Normal   Concentration:  No data recorded  Orientation:  X5   Recall/memory:  Normal   Affect and Mood  Affect:  Appropriate   Mood:  Depressed   Relating  Eye contact:  Normal   Facial expression:  Depressed; Sad   Attitude toward examiner:  Cooperative   Thought and Language  Speech flow: Clear and Coherent   Thought content:  Appropriate to Mood and Circumstances   Preoccupation:  No data recorded  Hallucinations:  None   Organization:  No data recorded  [employer] of Knowledge:  Average   Intelligence:  Average   Abstraction:  Normal   Judgement:   Good   Reality Testing:  Adequate   Insight:  Good; Lacking   Decision Making:  Normal   Social Functioning  Social Maturity:  Isolates   Social Judgement:  Normal   Stress  Stressors:  School   Coping Ability:  Exhausted   Skill Deficits:  Self-care   Supports:  Family     Religion: Religion/Spirituality Are You A Religious Person?:  (unknown) How Might This Affect Treatment?: n/a  Leisure/Recreation: Leisure / Recreation Do You Have Hobbies?: No (unknown)  Exercise/Diet: Exercise/Diet Do You Exercise?: No Have You Gained or Lost A Significant Amount of Weight in the Past Six Months?: No Do You Follow a Special Diet?: No Do You Have Any Trouble Sleeping?: Yes Explanation of Sleeping Difficulties: 4-5 hrs per night   CCA Employment/Education Employment/Work Situation: Employment / Work Situation Employment situation: Unemployed Patient's job has been impacted by current illness: Yes Describe how patient's job has been impacted: patient unable to hold a job due to mood swings What is the longest time patient has a held a job?: last employed 2-3 yrs ago Where was the patient employed at that time?: unknown Has patient ever been in the [employer]?: No  Education: Education Is Patient Currently Attending School?: Yes School Currently Attending: currently trying to obtain her GED and getting stressed about school Last Grade Completed:  (unknown; currently in a GED program) Name of High School: unknown Did Teacher, adult education From [employer]?: No Did You Nutritional therapist?: No Did Heritage manager?: No Did You Have Any Special Interests In School?: unknown Did You Have An Individualized Education Program (IIEP): No Did You Have Any Difficulty At [employer]?: No Patient's Education Has Been Impacted by Current Illness: No   CCA Family/Childhood History Family and Relationship History: Family history Marital status: Married Number of Years Married:   (unknown) What types of issues is patient dealing with in the relationship?: unknown Additional relationship information: unknown Are you sexually active?: No What is your sexual orientation?: heterosexual Has your sexual activity been affected by drugs, alcohol, medication, or emotional stress?: unknown Does patient have children?: Yes How many children?:  (3 children) How is patient's relationship with their children?: unknown  Childhood History:  Childhood History Additional childhood history information: unknown Description of patient's relationship with caregiver when they were a child: unknown Patient's description of current relationship with people who raised him/her: unkown How were you disciplined when you got in trouble as a child/adolescent?: unknown Does patient have siblings?:  (unknown) Did patient suffer any verbal/emotional/physical/sexual abuse as a child?:  (  unknown) Did patient suffer from severe childhood neglect?:  (unknown) Was the patient ever a victim of a crime or a disaster?:  (unknown) Witnessed domestic violence?:  (unknown) Has patient been affected by domestic violence as an adult?:  (unknown)  Child/Adolescent Assessment:     CCA Substance Use Alcohol/Drug Use: Alcohol / Drug Use Pain Medications: see MAR Prescriptions: see MAR Over the Counter: see MAR History of alcohol / drug use?: No history of alcohol / drug abuse Longest period of sobriety (when/how long): N/A                         ASAM's:  Six Dimensions of Multidimensional Assessment  Dimension 1:  Acute Intoxication and/or Withdrawal Potential:      Dimension 2:  Biomedical Conditions and Complications:      Dimension 3:  Emotional, Behavioral, or Cognitive Conditions and Complications:     Dimension 4:  Readiness to Change:     Dimension 5:  Relapse, Continued use, or Continued Problem Potential:     Dimension 6:  Recovery/Living Environment:     ASAM Severity  Score:    ASAM Recommended Level of Treatment:     Substance use Disorder (SUD)    Recommendations for Services/Supports/Treatments: Recommendations for Services/Supports/Treatments Recommendations For Services/Supports/Treatments: Medication Management,Inpatient Hospitalization  DSM5 Diagnoses: Patient Active Problem List   Diagnosis Date Noted  . Major depressive disorder, recurrent severe without psychotic features ([place]) 07/22/2020  . Major depressive disorder, recurrent episode, mild ([place]) 04/20/2018  . SVT (supraventricular tachycardia) ([place]) 01/22/2012  . E coli bacteremia 01/22/2012  . Sepsis(995.91) 01/20/2012  . UTI (lower urinary tract infection) 01/20/2012  . Arnold-Chiari malformation, type I ([place]) 01/20/2012  . Headache(784.0) 01/19/2012    Patient Centered Plan: Patient is on the following Treatment Plan(s):  Borderline Personality and Depression   Referrals to Alternative Service(s): Referred to Alternative Service(s):   Place:   Date:   Time:    Referred to Alternative Service(s):   Place:   Date:   Time:    Referred to Alternative Service(s):   Place:   Date:   Time:    Referred to Alternative Service(s):   Place:   Date:   Time:     Waldon Merl, Counselor

## 2020-07-22 NOTE — H&P (Addendum)
Behavioral Health Medical Screening Exam  [name] is an 40 y.o. female presented with her husband after she called him telling him she was having suicidal ideations, plan to overdose.  Suicide attempt by trying to jump from a bridge in 2020 and admitted to [facility].  The 7-8 years prior to that she had suicidal ideations.  Self-report of bipolar d/o yet no notes in system of this and only takes Celexa, diagnosis noted of borderline personality in records.  No mania symptoms.  Depression is high, some anxiety.  No panic attacks, substance use, or hallucinations.  Sleep is fair with 4-5 hours per night, appetite is "fair".  Lives with her husband and 3 children, large family support system.  Agreeable to go inpatient for stabilization.  Total Time spent with patient: 30 minutes  Psychiatric Specialty Exam: Physical Exam Vitals and nursing note reviewed.  Constitutional:      Appearance: Normal appearance.  HENT:     Head: Normocephalic.     Nose: Nose normal.  Pulmonary:     Effort: Pulmonary effort is normal.  Musculoskeletal:        General: Normal range of motion.     Cervical back: Normal range of motion.  Neurological:     General: No focal deficit present.     Mental Status: She is alert and oriented to person, place, and time.  Psychiatric:        Attention and Perception: Attention and perception normal.        Mood and Affect: Mood is anxious and depressed.        Speech: Speech normal.        Behavior: Behavior normal. Behavior is cooperative.        Thought Content: Thought content includes suicidal ideation. Thought content includes suicidal plan.        Cognition and Memory: Cognition and memory normal.        Judgment: Judgment normal.    Review of Systems  Psychiatric/Behavioral: Positive for dysphoric mood and suicidal ideas. The patient is nervous/anxious.   All other systems reviewed and are negative.  Blood pressure 116/78, pulse 62, temperature  98.6 F (37 C), temperature source Oral, resp. rate 16, SpO2 100 %.There is no height or weight on file to calculate BMI. General Appearance: Casual Eye Contact:  Good Speech:  Normal Rate Volume:  Decreased Mood:  Anxious and Depressed Affect:  Congruent Thought Process:  Coherent and Descriptions of Associations: Intact Orientation:  Full (Time, Place, and Person) Thought Content:  WDL and Logical Suicidal Thoughts:  No Homicidal Thoughts:  No Memory:  Immediate;   Fair Recent;   Fair Remote;   Fair Judgement:  Fair Insight:  Fair Psychomotor Activity:  Decreased Concentration: Concentration: Fair and Attention Span: Fair Recall:  [employer] of Knowledge:Good Language: Good Akathisia:  No Handed:  Right AIMS (if indicated):    Assets:  Housing Leisure Time Physical Health Resilience Social Support Sleep:     Musculoskeletal: Strength & Muscle Tone: within normal limits Gait & Station: normal Patient leans: N/A  Blood pressure 116/78, pulse 62, temperature 98.6 F (37 C), temperature source Oral, resp. rate 16, SpO2 100 %.  Recommendations: Based on my evaluation the patient does not appear to have an emergency medical condition.  Waylan Boga, NP 07/22/2020, 1:13 PM

## 2020-07-22 NOTE — ED Notes (Signed)
Pt belongings in locker 30.  
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
## 2021-02-07 ENCOUNTER — Other Ambulatory Visit: Payer: Self-pay

## 2021-02-08 ENCOUNTER — Ambulatory Visit (HOSPITAL_COMMUNITY)
Admission: EM | Admit: 2021-02-08 | Discharge: 2021-02-08 | Disposition: A | Discharge status: Home / Self Care | Payer: Medicare Other | Attending: Emergency Medicine | Admitting: Emergency Medicine

## 2021-02-08 ENCOUNTER — Encounter (HOSPITAL_COMMUNITY): Payer: Self-pay | Admitting: Emergency Medicine

## 2021-02-08 DIAGNOSIS — R0981 Nasal congestion: Secondary | ICD-10-CM | POA: Insufficient documentation

## 2021-02-08 DIAGNOSIS — Z880 Allergy status to penicillin: Secondary | ICD-10-CM | POA: Diagnosis not present

## 2021-02-08 DIAGNOSIS — Z885 Allergy status to narcotic agent status: Secondary | ICD-10-CM | POA: Insufficient documentation

## 2021-02-08 DIAGNOSIS — U071 COVID-19: Secondary | ICD-10-CM | POA: Diagnosis not present

## 2021-02-08 DIAGNOSIS — R6889 Other general symptoms and signs: Secondary | ICD-10-CM

## 2021-02-08 DIAGNOSIS — R051 Acute cough: Secondary | ICD-10-CM | POA: Diagnosis not present

## 2021-02-08 DIAGNOSIS — R7303 Prediabetes: Secondary | ICD-10-CM | POA: Insufficient documentation

## 2021-02-08 DIAGNOSIS — R519 Headache, unspecified: Secondary | ICD-10-CM | POA: Diagnosis not present

## 2021-02-08 DIAGNOSIS — Z888 Allergy status to other drugs, medicaments and biological substances status: Secondary | ICD-10-CM | POA: Insufficient documentation

## 2021-02-08 LAB — POCT URINALYSIS DIPSTICK, ED / UC
Bilirubin Urine: NEGATIVE
Glucose, UA: NEGATIVE mg/dL
Ketones, ur: NEGATIVE mg/dL
Leukocytes,Ua: NEGATIVE
Nitrite: NEGATIVE
Protein, ur: NEGATIVE mg/dL
Specific Gravity, Urine: <=1.005 (ref 1.005–1.030)
Urobilinogen, UA: 0.2 mg/dL (ref 0.0–1.0)
pH: 6.5 (ref 5.0–8.0)

## 2021-02-08 LAB — CBG MONITORING, ED: Glucose-Capillary: 103 mg/dL — ABNORMAL HIGH (ref 70–99)

## 2021-02-08 LAB — POC INFLUENZA A AND B ANTIGEN (URGENT CARE ONLY)
INFLUENZA A ANTIGEN, POC: NEGATIVE
INFLUENZA B ANTIGEN, POC: NEGATIVE

## 2021-02-08 LAB — SARS CORONAVIRUS 2 (TAT 6-24 HRS): SARS Coronavirus 2: POSITIVE — AB

## 2021-02-08 MED ORDER — METHYLPREDNISOLONE SODIUM SUCC 125 MG IJ SOLR
INTRAMUSCULAR | Status: AC
  Filled 2021-02-08: qty 2

## 2021-02-08 MED ORDER — KETOROLAC TROMETHAMINE 30 MG/ML IJ SOLN
INTRAMUSCULAR | Status: AC
  Filled 2021-02-08: qty 1

## 2021-02-08 MED ORDER — KETOROLAC TROMETHAMINE 30 MG/ML IJ SOLN
30.0000 mg | Freq: Once | INTRAMUSCULAR | Status: AC
  Administered 2021-02-08: 30 mg via INTRAMUSCULAR

## 2021-02-08 MED ORDER — METHYLPREDNISOLONE SODIUM SUCC 125 MG IJ SOLR
125.0000 mg | Freq: Once | INTRAMUSCULAR | Status: AC
Start: 2021-02-08 — End: 2021-02-08
  Administered 2021-02-08: 125 mg via INTRAMUSCULAR

## 2021-02-08 NOTE — ED Provider Notes (Signed)
[place]    CSN: [id number] Arrival date & time: 02/08/21  7371      History   Chief Complaint Chief Complaint  Patient presents with   Headache   Dizziness   Cough   Generalized Body Aches    HPI [name] is a 40 y.o. female.   Pt is here for cough, headache, sinus congestion, fever and bodyaches since sun. Pt is concerned of her sugar also since she is a pre diabetic. Has been taking tylenol and motrin with no relief. Does have a hx of headaches this is similar but it will not go away. Denies any change in vision. No one else at home sick.    Past Medical History:  Diagnosis Date   Anemia    h/o   Anginal pain ([place])    pt. takes nitrostat- as needed, hasn't had since 04/2012, sees Dr. Terrence Dupont, last ekg was /w PCP- [facility]   Anxiety    Asthma    Borderline personality disorder ([place])    Chiari malformation    Depression    GERD (gastroesophageal reflux disease)    Headache(784.0)    Meningitis    Oct. 2013   Neuromuscular disorder ([place])    muscle spasms-back & arms     Patient Active Problem List   Diagnosis Date Noted   Major depressive disorder, recurrent severe without psychotic features ([place]) 07/22/2020   Major depressive disorder, recurrent episode, mild ([place]) 04/20/2018   SVT (supraventricular tachycardia) (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/20/2012   Headache(784.0) 01/19/2012    Past Surgical History:  Procedure Laterality Date   ABDOMINAL HYSTERECTOMY     PERIPHERALLY INSERTED CENTRAL CATHETER INSERTION     during event of Meningitis- then d/c'd to home /w & treated /w antibiotics    SUBOCCIPITAL CRANIECTOMY CERVICAL LAMINECTOMY N/A 07/22/2012   Procedure: SUBOCCIPITAL CRANIECTOMY CERVICAL LAMINECTOMY/DURAPLASTY;  Surgeon: Hosie Spangle, MD;  Location: [facility];  Service:  Neurosurgery;  Laterality: N/A;  Suboccipital craniectomy with upper cervial laminectomy with duroplasty    TUBAL LIGATION     VAGINAL DELIVERY     x3   WISDOM TOOTH EXTRACTION      OB History   No obstetric history on file.      Home Medications    Prior to Admission medications   Medication Sig Start Date End Date Taking? Authorizing Provider  benzonatate (TESSALON) 100 MG capsule Take 1 capsule (100 mg total) by mouth every 8 (eight) hours. 07/16/20   Hughie Closs, PA-C  citalopram (CELEXA) 20 MG tablet Take 20 mg by mouth daily. 07/04/19   [provider]  cyclobenzaprine (FLEXERIL) 10 MG tablet Take 10 mg by mouth 3 (three) times daily as needed for muscle spasms.    [provider]  metroNIDAZOLE (FLAGYL) 500 MG tablet Take 1 tablet (500 mg total) by mouth 2 (two) times daily. 06/07/20   Chase Picket, MD  naproxen (NAPROSYN) 500 MG tablet Take 1 tablet (500 mg total) by mouth 2 (two) times daily with a meal. 09/05/19   Jaynee Eagles, PA-C  predniSONE (STERAPRED UNI-PAK 21 TAB) 10 MG (21) TBPK tablet Take by mouth daily. Take 6 tabs by mouth daily  for 2 days, then 5 tabs for 2 days, then 4 tabs for 2 days, then 3 tabs for 2 days, 2 tabs for 2  days, then 1 tab by mouth daily for 2 days 07/16/20   Hughie Closs, PA-C  Vitamin D, Ergocalciferol, (DRISDOL) 1.25 MG (50000 UNIT) CAPS capsule Take 50,000 Units by mouth every 7 (seven) days.    [provider]  fexofenadine (ALLEGRA) 180 MG tablet Take 180 mg by mouth daily.  09/05/19  [provider]    Family History Family History  Problem Relation Age of Onset   Heart attack Mother    Healthy Father     Social History Social History   Tobacco Use   Smoking status: Never   Smokeless tobacco: Never  Vaping Use   Vaping Use: Never used  Substance Use Topics   Alcohol use: No   Drug use: No     Allergies   Amoxicillin, Imitrex [sumatriptan base], Penicillins, Tramadol, Norco  [hydrocodone-acetaminophen], Percocet [oxycodone-acetaminophen], and Soy allergy   Review of Systems Review of Systems  Constitutional:  Positive for activity change, chills, fatigue and fever.  HENT:  Positive for congestion, postnasal drip, rhinorrhea, sinus pressure, sinus pain, sneezing and sore throat. Negative for ear pain and trouble swallowing.   Eyes: Negative.   Respiratory:  Positive for cough. Negative for shortness of breath and wheezing.   Cardiovascular: Negative.   Gastrointestinal: Negative.   Genitourinary: Negative.   Skin: Negative.   Neurological:  Positive for headaches. Negative for syncope, weakness, light-headedness and numbness.    Physical Exam Triage Vital Signs ED Triage Vitals  Enc Vitals Group     BP 02/08/21 0913 92/62     Pulse Rate 02/08/21 0913 (!) 107     Resp 02/08/21 0913 17     Temp 02/08/21 0913 98.7 F (37.1 C)     Temp src --      SpO2 02/08/21 0913 96 %     Weight --      Height --      Head Circumference --      Peak Flow --      Pain Score 02/08/21 0911 7     Pain Loc --      Pain Edu? --      Excl. in [place]? --    No data found.  Updated Vital Signs BP 92/62   Pulse (!) 107   Temp 98.7 F (37.1 C)   Resp 17   SpO2 96%   Visual Acuity Right Eye Distance:   Left Eye Distance:   Bilateral Distance:    Right Eye Near:   Left Eye Near:    Bilateral Near:     Physical Exam Constitutional:      Appearance: She is ill-appearing.  HENT:     Mouth/Throat:     Mouth: Mucous membranes are moist.  Cardiovascular:     Rate and Rhythm: Normal rate.  Pulmonary:     Effort: Pulmonary effort is normal.  Abdominal:     Palpations: Abdomen is soft.  Musculoskeletal:        General: Normal range of motion.  Skin:    General: Skin is warm.  Neurological:     Mental Status: She is alert.     UC Treatments / Results  Labs (all labs ordered are listed, but only abnormal results are displayed) Labs Reviewed  POCT  URINALYSIS DIPSTICK, ED / UC - Abnormal; Notable for the following components:      Result Value   Hgb urine dipstick TRACE (*)    All other components within normal limits  CBG MONITORING, ED -  Abnormal; Notable for the following components:   Glucose-Capillary 103 (*)    All other components within normal limits  SARS CORONAVIRUS 2 (TAT 6-24 HRS)  POC INFLUENZA A AND B ANTIGEN (URGENT CARE ONLY)    EKG   Radiology No results found.  Procedures Procedures (including critical care time)  Medications Ordered in UC Medications  ketorolac (TORADOL) 30 MG/ML injection 30 mg (30 mg Intramuscular Given 02/08/21 1011)  methylPREDNISolone sodium succinate (SOLU-MEDROL) 125 mg/2 mL injection 125 mg (125 mg Intramuscular Given 02/08/21 1013)    Initial Impression / Assessment and Plan / UC Course  I have reviewed the triage vital signs and the nursing notes.  Pertinent labs & imaging results that were available during my care of the patient were reviewed by me and considered in my medical decision making (see chart for details).    Avoid contact till these results return Provider, please review the patient's drug allergy history. Some issues need clarification. My chart in 3 days  Take tylenol or motrin as needed for pain  Go to er if symptoms become worse.  Use inhaler as needed  Cont to take the otc cough meds   Final Clinical Impressions(s) / UC Diagnoses   Final diagnoses:  Acute cough  Flu-like symptoms  Acute intractable headache, unspecified headache type     Discharge Instructions      Your flu test was negative we will send off covid test  Avoid contact till these results return Provider, please review the patient's drug allergy history. Some issues need clarification. My chart in 3 days  Take tylenol or motrin as needed for pain  Go to er if symptoms become worse.  Use inhaler as needed  Cont to take the otc cough meds      ED Prescriptions   None     PDMP not reviewed this encounter.   Marney Setting, NP 02/08/21 1034

## 2021-02-08 NOTE — Discharge Instructions (Addendum)
Your flu test was negative we will send off covid test  Avoid contact till these results return Provider, please review the patient's drug allergy history. Some issues need clarification. My chart in 3 days  Take tylenol or motrin as needed for pain  Go to er if symptoms become worse.  Use inhaler as needed  Cont to take the otc cough meds

## 2021-02-08 NOTE — ED Triage Notes (Signed)
Pt is present today with HA, dizziness, sweats, and cough. Pt states that her sx started Sunday. Pt states that she feels like her BS is low.

## 2021-02-09 ENCOUNTER — Telehealth (HOSPITAL_COMMUNITY): Payer: Self-pay | Admitting: Emergency Medicine

## 2021-02-09 MED ORDER — BENZONATATE 100 MG PO CAPS: 100.0000 mg | ORAL_CAPSULE | Freq: Three times a day (TID) | ORAL | 0 refills | Status: DC

## 2021-02-09 NOTE — Telephone Encounter (Signed)
Pt called requesting medication for cough. Okay with Arta Silence, MD to send Tessalon pearls to [facility]. Pt made aware via phone.

## 2021-02-16 ENCOUNTER — Ambulatory Visit (HOSPITAL_COMMUNITY)
Admission: EM | Admit: 2021-02-16 | Discharge: 2021-02-16 | Disposition: A | Discharge status: Home / Self Care | Payer: Medicare Other | Attending: Family Medicine | Admitting: Family Medicine

## 2021-02-16 ENCOUNTER — Encounter (HOSPITAL_COMMUNITY): Payer: Self-pay

## 2021-02-16 ENCOUNTER — Other Ambulatory Visit: Payer: Self-pay

## 2021-02-16 DIAGNOSIS — J4521 Mild intermittent asthma with (acute) exacerbation: Secondary | ICD-10-CM

## 2021-02-16 DIAGNOSIS — J40 Bronchitis, not specified as acute or chronic: Secondary | ICD-10-CM | POA: Diagnosis not present

## 2021-02-16 MED ORDER — ALBUTEROL SULFATE HFA 108 (90 BASE) MCG/ACT IN AERS: 1.0000 | INHALATION_SPRAY | Freq: Four times a day (QID) | RESPIRATORY_TRACT | 0 refills | Status: DC | PRN

## 2021-02-16 MED ORDER — PROMETHAZINE-DM 6.25-15 MG/5ML PO SYRP: 5.0000 mL | ORAL_SOLUTION | Freq: Four times a day (QID) | ORAL | 0 refills | Status: DC | PRN

## 2021-02-16 MED ORDER — PREDNISONE 20 MG PO TABS: 40.0000 mg | ORAL_TABLET | Freq: Every day | ORAL | 0 refills | Status: DC

## 2021-02-16 NOTE — ED Provider Notes (Signed)
[name]    CSN: [id number] Arrival date & time: 02/16/21  1005      History   Chief Complaint Chief Complaint  Patient presents with   Cough   Shortness of Breath    HPI [name] is a 40 y.o. female.   Patient presenting today with over a week of worsening chest tightness, shortness of breath, chest soreness with coughing following COVID-19 diagnosis.  She states that she has no fevers, lasting nasal congestion, sore throat, body aches, abdominal pain, nausea vomiting or diarrhea.  Taking Robitussin, DayQuil, NyQuil with mild temporary relief.  History of asthma, does not currently have an albuterol inhaler on hand.   Past Medical History:  Diagnosis Date   Anemia    h/o   Anginal pain ([place])    pt. takes nitrostat- as needed, hasn't had since 04/2012, sees Dr. Terrence Dupont, last ekg was /w PCP- [facility]   Anxiety    Asthma    Borderline personality disorder ([place])    Chiari malformation    Depression    GERD (gastroesophageal reflux disease)    Headache(784.0)    Meningitis    Oct. 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   Headache(784.0) 01/19/2012    Past Surgical History:  Procedure Laterality Date   ABDOMINAL HYSTERECTOMY     PERIPHERALLY INSERTED CENTRAL CATHETER INSERTION     during event of Meningitis- then d/c'd to home /w & treated /w antibiotics    SUBOCCIPITAL CRANIECTOMY CERVICAL LAMINECTOMY N/A 07/22/2012   Procedure: SUBOCCIPITAL CRANIECTOMY CERVICAL LAMINECTOMY/DURAPLASTY;   Surgeon: Hosie Spangle, MD;  Location: [facility];  Service: Neurosurgery;  Laterality: N/A;  Suboccipital craniectomy with upper cervial laminectomy with duroplasty    TUBAL LIGATION     VAGINAL DELIVERY     x3   WISDOM TOOTH EXTRACTION      OB History   No obstetric history on file.      Home Medications    Prior to Admission medications   Medication Sig Start Date End Date Taking? Authorizing Provider  albuterol (VENTOLIN HFA) 108 (90 Base) MCG/ACT inhaler Inhale 1-2 puffs into the lungs every 6 (six) hours as needed for wheezing or shortness of breath. 02/16/21  Yes Volney American, PA-C  predniSONE (DELTASONE) 20 MG tablet Take 2 tablets (40 mg total) by mouth daily with breakfast. 02/16/21  Yes Volney American, PA-C  promethazine-dextromethorphan (PROMETHAZINE-DM) 6.25-15 MG/5ML syrup Take 5 mLs by mouth 4 (four) times daily as needed for cough. 02/16/21  Yes Volney American, PA-C  benzonatate (TESSALON) 100 MG capsule Take 1 capsule (100 mg total) by mouth 3 (three) times daily. 02/09/21   Vanessa Kick, MD  citalopram (CELEXA) 20 MG tablet Take 20 mg by mouth daily. 07/04/19   [provider]  cyclobenzaprine (FLEXERIL) 10 MG tablet Take 10 mg by mouth 3 (three) times daily as needed for muscle spasms.    [provider]  metroNIDAZOLE (FLAGYL) 500 MG tablet Take 1 tablet (500 mg total) by mouth 2 (two) times daily. 06/07/20  Chase Picket, MD  naproxen (NAPROSYN) 500 MG tablet Take 1 tablet (500 mg total) by mouth 2 (two) times daily with a meal. 09/05/19   Jaynee Eagles, PA-C  predniSONE (STERAPRED UNI-PAK 21 TAB) 10 MG (21) TBPK tablet Take by mouth daily. Take 6 tabs by mouth daily  for 2 days, then 5 tabs for 2 days, then 4 tabs for 2 days, then 3 tabs for 2 days, 2 tabs for 2 days, then 1 tab by mouth daily for 2 days 07/16/20   Hughie Closs, PA-C  Vitamin D, Ergocalciferol, (DRISDOL) 1.25 MG (50000 UNIT) CAPS capsule Take  50,000 Units by mouth every 7 (seven) days.    [provider]  fexofenadine (ALLEGRA) 180 MG tablet Take 180 mg by mouth daily.  09/05/19  [provider]    Family History Family History  Problem Relation Age of Onset   Heart attack Mother    Healthy Father     Social History Social History   Tobacco Use   Smoking status: Never   Smokeless tobacco: Never  Vaping Use   Vaping Use: Never used  Substance Use Topics   Alcohol use: No   Drug use: No     Allergies   Amoxicillin, Imitrex [sumatriptan base], Penicillins, Tramadol, Norco [hydrocodone-acetaminophen], Percocet [oxycodone-acetaminophen], and Soy allergy   Review of Systems Review of Systems Per HPI  Physical Exam Triage Vital Signs ED Triage Vitals  Enc Vitals Group     BP 02/16/21 1130 115/80     Pulse Rate 02/16/21 1130 76     Resp 02/16/21 1130 17     Temp 02/16/21 1130 98.4 F (36.9 C)     Temp Source 02/16/21 1130 Oral     SpO2 02/16/21 1130 99 %     Weight --      Height --      Head Circumference --      Peak Flow --      Pain Score 02/16/21 1129 5     Pain Loc --      Pain Edu? --      Excl. in [place]? --    No data found.  Updated Vital Signs BP 115/80 (BP Location: Right Arm)   Pulse 76   Temp 98.4 F (36.9 C) (Oral)   Resp 17   SpO2 99%   Visual Acuity Right Eye Distance:   Left Eye Distance:   Bilateral Distance:    Right Eye Near:   Left Eye Near:    Bilateral Near:     Physical Exam Vitals and nursing note reviewed.  Constitutional:      Appearance: Normal appearance. She is not ill-appearing.  HENT:     Head: Atraumatic.     Right Ear: Tympanic membrane normal.     Left Ear: Tympanic membrane normal.     Nose: Nose normal.     Mouth/Throat:     Mouth: Mucous membranes are moist.     Pharynx: Oropharynx is clear.  Eyes:     Extraocular Movements: Extraocular movements intact.     Conjunctiva/sclera: Conjunctivae normal.  Cardiovascular:     Rate  and Rhythm: Normal rate and regular rhythm.     Heart sounds: Normal heart sounds.  Pulmonary:     Effort: Pulmonary effort is normal.     Breath sounds: Normal breath sounds. No wheezing or rales.  Musculoskeletal:        General: Normal range of motion.  Cervical back: Normal range of motion and neck supple.  Skin:    General: Skin is warm and dry.  Neurological:     Mental Status: She is alert and oriented to person, place, and time.  Psychiatric:        Mood and Affect: Mood normal.        Thought Content: Thought content normal.        Judgment: Judgment normal.     UC Treatments / Results  Labs (all labs ordered are listed, but only abnormal results are displayed) Labs Reviewed - No data to display  EKG   Radiology No results found.  Procedures Procedures (including critical care time)  Medications Ordered in UC Medications - No data to display  Initial Impression / Assessment and Plan / UC Course  I have reviewed the triage vital signs and the nursing notes.  Pertinent labs & imaging results that were available during my care of the patient were reviewed by me and considered in my medical decision making (see chart for details).     Suspect bronchitis secondary to COVID-19.  Will refill albuterol inhaler and treat with prednisone, Phenergan DM additionally.  Follow-up for worsening symptoms.  School note given.  Final Clinical Impressions(s) / UC Diagnoses   Final diagnoses:  Bronchitis  Mild intermittent asthma with exacerbation   Discharge Instructions   None    ED Prescriptions     Medication Sig Dispense Auth. Provider   predniSONE (DELTASONE) 20 MG tablet Take 2 tablets (40 mg total) by mouth daily with breakfast. 10 tablet Volney American, PA-C   albuterol (VENTOLIN HFA) 108 (90 Base) MCG/ACT inhaler Inhale 1-2 puffs into the lungs every 6 (six) hours as needed for wheezing or shortness of breath. [place], [place]    promethazine-dextromethorphan (PROMETHAZINE-DM) 6.25-15 MG/5ML syrup Take 5 mLs by mouth 4 (four) times daily as needed for cough. 100 mL Volney American, [place]      PDMP not reviewed this encounter.   Volney American, [place] 02/16/21 1215

## 2021-02-16 NOTE — ED Triage Notes (Signed)
Pt presents with ongoing cough, shortness of breath, and chest & back discomfort when she breaths in and out post covid on 02/09/2021.

## 2021-03-10 ENCOUNTER — Other Ambulatory Visit: Payer: Self-pay | Admitting: Family Medicine

## 2021-03-14 ENCOUNTER — Other Ambulatory Visit: Payer: Self-pay | Admitting: Family Medicine

## 2021-03-14 NOTE — Telephone Encounter (Signed)
Duplicate request- no longer under care

## 2021-04-01 ENCOUNTER — Encounter (HOSPITAL_COMMUNITY): Payer: Self-pay

## 2021-04-01 ENCOUNTER — Ambulatory Visit (HOSPITAL_COMMUNITY)
Admission: EM | Admit: 2021-04-01 | Discharge: 2021-04-01 | Disposition: A | Discharge status: Home / Self Care | Payer: Medicare Other | Attending: Internal Medicine | Admitting: Internal Medicine

## 2021-04-01 DIAGNOSIS — N898 Other specified noninflammatory disorders of vagina: Secondary | ICD-10-CM | POA: Diagnosis present

## 2021-04-01 LAB — POCT URINALYSIS DIPSTICK, ED / UC
Bilirubin Urine: NEGATIVE
Glucose, UA: NEGATIVE mg/dL
Hgb urine dipstick: NEGATIVE
Ketones, ur: NEGATIVE mg/dL
Leukocytes,Ua: NEGATIVE
Nitrite: NEGATIVE
Protein, ur: NEGATIVE mg/dL
Specific Gravity, Urine: 1.02 (ref 1.005–1.030)
Urobilinogen, UA: 0.2 mg/dL (ref 0.0–1.0)
pH: 6.5 (ref 5.0–8.0)

## 2021-04-01 NOTE — Discharge Instructions (Signed)
Try using lubrication with sexual intercourse. If you continue having pain with sex, please get help with it.   You will get a call if tests are positive, you will not get a call if tests are negative but you can check results in MyChart if you have a MyChart account.

## 2021-04-01 NOTE — ED Triage Notes (Signed)
Pt c/o lower abdominal cramping and burning on urination x4 days. Hx of UTI's and states this feels the same.

## 2021-04-01 NOTE — ED Provider Notes (Signed)
[facility]    CSN: [id number] Arrival date & time: 04/01/21  6283      History   Chief Complaint Chief Complaint  Patient presents with   Urinary Tract Infection    HPI [name] is a 40 y.o. female. Pt initially reported dysuria but after discussion and negative UA results, it seems more that pt has vaginal irritation and discharge. Reports shaving pubic hair and using lotion on area and then developed white discharge and vaginal irritation/itchiness. Denies odor. Reports sex is sometimes painful but cannot qualify pain or explain pain location.    Urinary Tract Infection Associated symptoms: vaginal discharge   Associated symptoms: no abdominal pain, no fever, no nausea and no vomiting    Past Medical History:  Diagnosis Date   Anemia    h/o   Anginal pain ([place])    pt. takes nitrostat- as needed, hasn't had since 04/2012, sees Dr. Terrence Dupont, last ekg was /w PCP- [facility]   Anxiety    Asthma    Borderline personality disorder ([place])    Chiari malformation    Depression    GERD (gastroesophageal reflux disease)    Headache(784.0)    Meningitis    Oct. 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   Headache(784.0) 01/19/2012    Past Surgical History:  Procedure Laterality Date   ABDOMINAL HYSTERECTOMY     PERIPHERALLY INSERTED CENTRAL CATHETER INSERTION     during event of Meningitis- then d/c'd to home /w & treated /w antibiotics    SUBOCCIPITAL CRANIECTOMY CERVICAL LAMINECTOMY N/A 07/22/2012    Procedure: SUBOCCIPITAL CRANIECTOMY CERVICAL LAMINECTOMY/DURAPLASTY;  Surgeon: Hosie Spangle, MD;  Location: [facility];  Service: Neurosurgery;  Laterality: N/A;  Suboccipital craniectomy with upper cervial laminectomy with duroplasty    TUBAL LIGATION     VAGINAL DELIVERY     x3   WISDOM TOOTH EXTRACTION      OB History   No obstetric history on file.      Home Medications    Prior to Admission medications   Medication Sig Start Date End Date Taking? Authorizing Provider  albuterol (VENTOLIN HFA) 108 (90 Base) MCG/ACT inhaler Inhale 1-2 puffs into the lungs every 6 (six) hours as needed for wheezing or shortness of breath. 02/16/21   Volney American, PA-C  citalopram (CELEXA) 20 MG tablet Take 20 mg by mouth daily. 07/04/19   [provider]  cyclobenzaprine (FLEXERIL) 10 MG tablet Take 10 mg by mouth 3 (three) times daily as needed for muscle spasms.    [provider]  naproxen (NAPROSYN) 500 MG tablet Take 1 tablet (500 mg total) by mouth 2 (two) times daily with a meal. 09/05/19   Jaynee Eagles, PA-C  Vitamin D, Ergocalciferol, (DRISDOL) 1.25 MG (50000 UNIT) CAPS capsule Take 50,000 Units by mouth every 7 (seven) days.    [provider]  fexofenadine (ALLEGRA) 180 MG tablet Take 180 mg by mouth daily.  09/05/19  [provider]    Family History Family History  Problem Relation Age of Onset  Heart attack Mother    Healthy Father     Social History Social History   Tobacco Use   Smoking status: Never   Smokeless tobacco: Never  Vaping Use   Vaping Use: Never used  Substance Use Topics   Alcohol use: No   Drug use: No     Allergies   Amoxicillin, Imitrex [sumatriptan base], Penicillins, Tramadol, Norco [hydrocodone-acetaminophen], Percocet [oxycodone-acetaminophen], and Soy allergy   Review of Systems Review of Systems  Constitutional:  Negative for chills and fever.  Gastrointestinal:  Negative for abdominal pain,  nausea and vomiting.  Genitourinary:  Positive for dyspareunia, dysuria and vaginal discharge. Negative for genital sores and vaginal bleeding.    Physical Exam Triage Vital Signs ED Triage Vitals  Enc Vitals Group     BP 04/01/21 1101 119/60     Pulse Rate 04/01/21 1101 67     Resp 04/01/21 1101 18     Temp 04/01/21 1101 (!) 97.5 F (36.4 C)     Temp Source 04/01/21 1101 Oral     SpO2 04/01/21 1101 99 %     Weight --      Height --      Head Circumference --      Peak Flow --      Pain Score 04/01/21 1102 6     Pain Loc --      Pain Edu? --      Excl. in [place]? --    No data found.  Updated Vital Signs BP 119/60 (BP Location: Left Arm)   Pulse 67   Temp (!) 97.5 F (36.4 C) (Oral)   Resp 18   SpO2 99%   Visual Acuity Right Eye Distance:   Left Eye Distance:   Bilateral Distance:    Right Eye Near:   Left Eye Near:    Bilateral Near:     Physical Exam Exam conducted with a chaperone present.  Constitutional:      Appearance: Normal appearance.  Pulmonary:     Effort: Pulmonary effort is normal.  Genitourinary:    General: Normal vulva.     Exam position: Lithotomy position.     Pubic Area: No rash.      Vagina: Vaginal discharge present.     Cervix: Normal.     Comments: Thin, white discharge present in vagina Neurological:     Mental Status: She is alert.     UC Treatments / Results  Labs (all labs ordered are listed, but only abnormal results are displayed) Labs Reviewed  POCT URINALYSIS DIPSTICK, ED / UC  CERVICOVAGINAL ANCILLARY ONLY    EKG   Radiology No results found.  Procedures Procedures (including critical care time)  Medications Ordered in UC Medications - No data to display  Initial Impression / Assessment and Plan / UC Course  I have reviewed the triage vital signs and the nursing notes.  Pertinent labs & imaging results that were available during my care of the patient were reviewed by me and considered in my medical  decision making (see chart for details).    Pt will f/u with pcp or Ob/gyn regarding pain with sex. Pt will try using lubricant during sex. Yeast, BV results pending.   Final Clinical Impressions(s) / UC Diagnoses   Final diagnoses:  Vaginal irritation     Discharge Instructions      Try using lubrication with sexual intercourse. If you continue having pain with sex, please get help with it.   You will  get a call if tests are positive, you will not get a call if tests are negative but you can check results in MyChart if you have a MyChart account.    ED Prescriptions   None    PDMP not reviewed this encounter.   Carvel Getting, NP 04/01/21 1214

## 2021-04-04 LAB — CERVICOVAGINAL ANCILLARY ONLY
Bacterial Vaginitis (gardnerella): POSITIVE — AB
Candida Glabrata: NEGATIVE
Candida Vaginitis: NEGATIVE
Chlamydia: NEGATIVE
Comment: NEGATIVE
Comment: NEGATIVE
Comment: NEGATIVE
Comment: NEGATIVE
Comment: NEGATIVE
Comment: NORMAL
Neisseria Gonorrhea: NEGATIVE
Trichomonas: NEGATIVE

## 2021-04-05 ENCOUNTER — Telehealth (HOSPITAL_COMMUNITY): Payer: Self-pay | Admitting: Emergency Medicine

## 2021-04-05 MED ORDER — METRONIDAZOLE 500 MG PO TABS: 500.0000 mg | ORAL_TABLET | Freq: Two times a day (BID) | ORAL | 0 refills | Status: DC

## 2021-07-27 ENCOUNTER — Other Ambulatory Visit: Payer: Self-pay | Admitting: Internal Medicine

## 2021-07-27 DIAGNOSIS — Z1231 Encounter for screening mammogram for malignant neoplasm of breast: Secondary | ICD-10-CM

## 2021-08-05 ENCOUNTER — Ambulatory Visit
Admission: RE | Admit: 2021-08-05 | Discharge: 2021-08-05 | Disposition: A | Discharge status: Home / Self Care | Payer: Medicare Other | Source: Ambulatory Visit | Attending: Internal Medicine | Admitting: Internal Medicine

## 2021-08-05 DIAGNOSIS — Z1231 Encounter for screening mammogram for malignant neoplasm of breast: Secondary | ICD-10-CM

## 2021-09-06 ENCOUNTER — Ambulatory Visit (HOSPITAL_COMMUNITY)
Admission: EM | Admit: 2021-09-06 | Discharge: 2021-09-06 | Disposition: A | Discharge status: Home / Self Care | Payer: Medicare Other | Attending: Family Medicine | Admitting: Family Medicine

## 2021-09-06 ENCOUNTER — Encounter (HOSPITAL_COMMUNITY): Payer: Self-pay

## 2021-09-06 DIAGNOSIS — K149 Disease of tongue, unspecified: Secondary | ICD-10-CM | POA: Diagnosis not present

## 2021-09-06 NOTE — ED Triage Notes (Signed)
Pt presents with possible allergic reaction to her tongue after eating a whole bunch of fresh pineapple today. ?

## 2021-09-07 NOTE — ED Provider Notes (Signed)
? ?[name] ? ? ?[id number] ?09/06/21 Arrival Time: 0932 ? ?ASSESSMENT & PLAN: ? ?1. Tongue irritation   ? ?No resp/swallowing difficulties. Doubt allergic rxn. Likely tongue irritation related to acidity of excessive pineapple intake. Comfortable with home observation. May f/u as needed. ? ?Reviewed expectations re: course of current medical issues. Questions answered. ?Outlined signs and symptoms indicating need for more acute intervention. ?Patient verbalized understanding. ?After Visit Summary given. ? ? ?SUBJECTIVE: ?History from: patient. ?[name] is a 41 y.o. female who presents for evaluation of a possible allergic reaction. Reports tongue irritation after eating "a lot of pineapple" over past 1-2 days. No swallowing or resp problems.  ? ? ?OBJECTIVE: ? ?Vitals:  ? 09/06/21 1811  ?BP: 139/88  ?Pulse: 93  ?Resp: 17  ?Temp: 99.5 ?F (37.5 ?C)  ?TempSrc: Oral  ?SpO2: 97%  ?  ?General appearance: alert; no distress ?Eyes: PERRLA; EOMI; conjunctiva normal ?HENT: normocephalic; atraumatic; oropharynx normal ?Neck: supple  ?Lungs: clear to auscultation bilaterally ?Abdomen: soft, non-tender ?Skin: warm and dry ?Psychological: alert and cooperative; normal mood and affect ? ? ?Allergies  ?Allergen Reactions  ? Amoxicillin Anaphylaxis and Hives  ?  Has patient had a PCN reaction causing immediate rash, facial/tongue/throat swelling, SOB or lightheadedness with hypotension: Yes ?Has patient had a PCN reaction causing severe rash involving mucus membranes or skin necrosis: Yes ?Has patient had a PCN reaction that required hospitalization No ?Has patient had a PCN reaction occurring within the last 10 years: No ?If all of the above answers are "NO", then may proceed with Cephalosporin use. ?  ? Imitrex [Sumatriptan Base] Anaphylaxis and Hives  ? Penicillins Anaphylaxis and Hives  ?  Has patient had a PCN reaction causing immediate rash, facial/tongue/throat swelling, SOB or lightheadedness with  hypotension: Yes ?Has patient had a PCN reaction causing severe rash involving mucus membranes or skin necrosis: Yes ?Has patient had a PCN reaction that required hospitalization No ?Has patient had a PCN reaction occurring within the last 10 years: No ?If all of the above answers are "NO", then may proceed with Cephalosporin use. ?  ? Tramadol Shortness Of Breath and Palpitations  ? Norco [Hydrocodone-Acetaminophen] Other (See Comments)  ?  Hallucinations   ? Percocet [Oxycodone-Acetaminophen] Other (See Comments)  ?  Hallucinations   ? Soy Allergy Hives  ? ? ?Past Medical History:  ?Diagnosis Date  ? Anemia   ? h/o  ? Anginal pain ([place])   ? pt. takes nitrostat- as needed, hasn't had since 04/2012, sees Dr. Terrence Dupont, last ekg was /w PCP- Lorra Hals  ? Anxiety   ? Asthma   ? Borderline personality disorder ([place])   ? Chiari malformation   ? Depression   ? GERD (gastroesophageal reflux disease)   ? Headache(784.0)   ? Meningitis   ? Oct. 2013  ? Neuromuscular disorder ([place])   ? muscle spasms-back & arms   ? ?Social History  ? ?Socioeconomic History  ? Marital status: Married  ?  Spouse name: Not on file  ? Number of children: Not on file  ? Years of education: Not on file  ? Highest education level: Not on file  ?Occupational History  ? Not on file  ?Tobacco Use  ? Smoking status: Never  ? Smokeless tobacco: Never  ?Vaping Use  ? Vaping Use: Never used  ?Substance and Sexual Activity  ? Alcohol use: No  ? Drug use: No  ? Sexual activity: Yes  ?  Birth control/protection: Surgical  ?Other  Topics Concern  ? Not on file  ?Social History Narrative  ? Not on file  ? ?Social Determinants of Health  ? ?Financial Resource Strain: Not on file  ?Food Insecurity: Not on file  ?Transportation Needs: Not on file  ?Physical Activity: Not on file  ?Stress: Not on file  ?Social Connections: Not on file  ?Intimate Partner Violence: Not on file  ? ?Family History  ?Problem Relation Age of Onset  ? Heart attack Mother   ? Healthy Father    ? ?Past Surgical History:  ?Procedure Laterality Date  ? ABDOMINAL HYSTERECTOMY    ? PERIPHERALLY INSERTED CENTRAL CATHETER INSERTION    ? during event of Meningitis- then d/c'd to home /w & treated /w antibiotics   ? SUBOCCIPITAL CRANIECTOMY CERVICAL LAMINECTOMY N/A 07/22/2012  ? Procedure: SUBOCCIPITAL CRANIECTOMY CERVICAL LAMINECTOMY/DURAPLASTY;  Surgeon: Hosie Spangle, MD;  Location: [facility];  Service: Neurosurgery;  Laterality: N/A;  Suboccipital craniectomy with upper cervial laminectomy with duroplasty   ? TUBAL LIGATION    ? VAGINAL DELIVERY    ? x3  ? WISDOM TOOTH EXTRACTION    ? ?  ?Vanessa Kick, MD ?09/07/21 [phone] ? ?

## 2021-11-02 ENCOUNTER — Other Ambulatory Visit: Payer: Self-pay | Admitting: Family Medicine

## 2021-11-03 ENCOUNTER — Encounter (HOSPITAL_BASED_OUTPATIENT_CLINIC_OR_DEPARTMENT_OTHER): Payer: Self-pay | Admitting: Nurse Practitioner

## 2021-11-03 ENCOUNTER — Ambulatory Visit (INDEPENDENT_AMBULATORY_CARE_PROVIDER_SITE_OTHER): Payer: Medicare Other | Admitting: Nurse Practitioner

## 2021-11-03 VITALS — BP 106/78 | HR 82 | Temp 97.8°F | Ht 64.0 in | Wt 185.6 lb

## 2021-11-03 DIAGNOSIS — E559 Vitamin D deficiency, unspecified: Secondary | ICD-10-CM

## 2021-11-03 DIAGNOSIS — F411 Generalized anxiety disorder: Secondary | ICD-10-CM | POA: Diagnosis not present

## 2021-11-03 DIAGNOSIS — Z0000 Encounter for general adult medical examination without abnormal findings: Secondary | ICD-10-CM

## 2021-11-03 DIAGNOSIS — J4520 Mild intermittent asthma, uncomplicated: Secondary | ICD-10-CM

## 2021-11-03 DIAGNOSIS — H539 Unspecified visual disturbance: Secondary | ICD-10-CM | POA: Diagnosis not present

## 2021-11-03 DIAGNOSIS — F339 Major depressive disorder, recurrent, unspecified: Secondary | ICD-10-CM

## 2021-11-03 DIAGNOSIS — R7303 Prediabetes: Secondary | ICD-10-CM | POA: Diagnosis not present

## 2021-11-03 DIAGNOSIS — R519 Headache, unspecified: Secondary | ICD-10-CM

## 2021-11-03 DIAGNOSIS — F900 Attention-deficit hyperactivity disorder, predominantly inattentive type: Secondary | ICD-10-CM | POA: Diagnosis not present

## 2021-11-03 MED ORDER — IBUPROFEN 800 MG PO TABS: 800.0000 mg | ORAL_TABLET | Freq: Two times a day (BID) | ORAL | 3 refills | Status: DC | PRN

## 2021-11-03 MED ORDER — CITALOPRAM HYDROBROMIDE 20 MG PO TABS: 20.0000 mg | ORAL_TABLET | Freq: Every day | ORAL | 3 refills | Status: DC

## 2021-11-03 MED ORDER — METHYLPHENIDATE HCL ER (OSM) 18 MG PO TBCR: 18.0000 mg | EXTENDED_RELEASE_TABLET | Freq: Every day | ORAL | 0 refills | Status: DC

## 2021-11-03 MED ORDER — ALBUTEROL SULFATE HFA 108 (90 BASE) MCG/ACT IN AERS: 1.0000 | INHALATION_SPRAY | Freq: Four times a day (QID) | RESPIRATORY_TRACT | 3 refills | Status: DC | PRN

## 2021-11-03 NOTE — Progress Notes (Signed)
Evelyn Render, DNP, AGNP-c [facility] [address]  [place], [place] [address] [phone]  New patient visit   Patient: [name]   DOB: 10-24-1980   41 y.o. Female  MRN: [id number] Visit Date: 11/03/2021  Patient Care Team: Trishia Cuthrell, Coralee Pesa, NP as PCP - General (Nurse Practitioner)  Today's Vitals   11/03/21 0853 11/03/21 0930  BP: 132/86 106/78  Pulse: 82   Temp: 97.8 F (36.6 C)   TempSrc: Oral   SpO2: 100%   Weight: 185 lb 9.6 oz (84.2 kg)   Height: _0  (1.626 m)    Body mass index is 31.86 kg/m.   Today's healthcare provider: Orma Render, NP   Chief Complaint  Patient presents with   New Patient (Initial Visit)    Pt here to establish new care, pt stated she need ADHD medication, need referral over to the eye doctors, need refills on some medications    Subjective    [name] is a 41 y.o. female who presents today as a new patient to establish care.    Patient endorses the following concerns presently: Elevated blood pressure with rooming Repeat BP 106/78. Normal blood pressure is in the 110's. . She has been having some headaches intermittently but nothing significant. No dizziness, CP, shortness of breath, lower extremity edema.  She has started working out at [employer] regularly and is trying to get her health in order  She feels she has had some vision changes her last check was about 2 years ago. She has to hold items farther from her face to focus and read.   Psychiatry services with [facility] therapist, Dr. Lynnette Caffey - They do not prescribe at this practice, but her PCP was prescribing in the past. She has been on celexa for many years and she doesn't feel like it is working as well as it has been.  She has had many bad reactions in the past and is worried about changing medications.  Previously she was on very high doses of multiple medications all at the same time and she  does not wish to go back to this. She is an Geographical information systems officer. She tells me she looses and gains weight all of the time.  She reports a diagnosis history of bipolar disorder.    ADHD - in the past adderall made her manic. Her previous provider has been trying to get her on Concerta, however there have been issues with her insurance. She is working on her GED right now and she had to leave in person school because she could not sit still and felt like she was distracting others. 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   Takes a probiotic for bloating- has been a problem for many years. Also taking Combat cravings gummies and a vitamin called combat stress.   Ashtma- acts up in the heat, but otherwise ok. No concerns with exacerbations not controlled with her inhaler.   History reviewed and reveals the following: Past Medical History:  Diagnosis Date   Anemia    h/o   Anginal pain ([place])    pt. takes nitrostat- as needed, hasn't had since 04/2012, sees Dr. 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 2013   Neuromuscular disorder ([facility])    muscle spasms-back & arms    SVT (supraventricular tachycardia) ([place]) 01/22/2012   UTI (lower urinary tract infection) 01/20/2012   Past Surgical History:  Procedure Laterality Date   ABDOMINAL HYSTERECTOMY     PERIPHERALLY INSERTED CENTRAL CATHETER INSERTION     during event of Meningitis- then d/c'd to home /w & treated /w antibiotics    SUBOCCIPITAL CRANIECTOMY CERVICAL LAMINECTOMY N/A 07/22/2012   Procedure: SUBOCCIPITAL CRANIECTOMY CERVICAL LAMINECTOMY/DURAPLASTY;  Surgeon: Hosie Spangle, MD;   Location: [facility];  Service: Neurosurgery;  Laterality: N/A;  Suboccipital craniectomy with upper cervial laminectomy with duroplasty    TUBAL LIGATION     VAGINAL DELIVERY     x3   WISDOM TOOTH EXTRACTION     Family Status  Relation Name Status   Mother  Deceased   Father  Alive   Family History  Problem Relation Age of Onset   Heart attack Mother    Healthy Father    Social History   Socioeconomic History   Marital status: Married    Spouse name: Not on file   Number of children: Not on file   Years of education: Not on file   Highest education level: Not on file  Occupational History   Not on file  Tobacco Use   Smoking status: Never    Passive exposure: Never   Smokeless tobacco: Never  Vaping Use   Vaping Use: Never used  Substance and Sexual Activity   Alcohol use: No   Drug use: No   Sexual activity: Yes    Birth control/protection: Surgical  Other Topics Concern   Not on file  Social History Narrative   Not on file   Social Determinants of Health   Financial Resource Strain: Not on file  Food Insecurity: Not on file  Transportation Needs: Not on file  Physical Activity: Not on file  Stress: Not on file  Social Connections: Not on file   Outpatient Medications Prior to Visit  Medication Sig   cyclobenzaprine (FLEXERIL) 10 MG tablet Take 10 mg by mouth 3 (three) times daily as needed for muscle spasms.   naproxen (NAPROSYN) 500 MG tablet Take 1 tablet (500 mg total) by mouth 2 (two) times daily with a meal.   [DISCONTINUED] citalopram (CELEXA) 20 MG tablet Take 20 mg by mouth daily.   [DISCONTINUED] Vitamin D, Ergocalciferol, (DRISDOL) 1.25 MG (50000 UNIT) CAPS capsule Take 50,000 Units by mouth every 7 (seven) days.   [DISCONTINUED] albuterol (VENTOLIN HFA) 108 (90 Base) MCG/ACT inhaler Inhale 1-2 puffs into the lungs every 6 (six) hours as needed for wheezing or shortness of breath. (Patient not taking: Reported on 11/03/2021)   [DISCONTINUED]  ibuprofen (ADVIL) 800 MG tablet Take 800 mg by mouth 2 (two) times daily as needed.   No facility-administered medications prior to visit.   Allergies  Allergen Reactions   Amoxicillin Anaphylaxis and Hives    Has patient had a PCN reaction causing immediate rash, facial/tongue/throat swelling, SOB or lightheadedness with hypotension: Yes Has patient had a PCN reaction causing severe rash involving mucus membranes or skin necrosis: Yes Has patient had a PCN reaction that required hospitalization No Has patient had a PCN reaction occurring within the last 10 years: No  If all of the above answers are "NO", then may proceed with Cephalosporin use.    Imitrex [Sumatriptan Base] Anaphylaxis and Hives   Penicillins Anaphylaxis and Hives    Has patient had a PCN reaction causing immediate rash, facial/tongue/throat swelling, SOB or lightheadedness with hypotension: Yes Has patient had a PCN reaction causing severe rash involving mucus membranes or skin necrosis: Yes Has patient had a PCN reaction that required hospitalization No Has patient had a PCN reaction occurring within the last 10 years: No If all of the above answers are "NO", then may proceed with Cephalosporin use.    Tramadol Shortness Of Breath and Palpitations   Norco [Hydrocodone-Acetaminophen] Other (See Comments)    Hallucinations    Percocet [Oxycodone-Acetaminophen] Other (See Comments)    Hallucinations    Soy Allergy Hives   Immunization History  Administered Date(s) Administered   Td 10/23/1999    Review of Systems All review of systems negative except what is listed in the HPI   Objective    BP 106/78   Pulse 82   Temp 97.8 F (36.6 C) (Oral)   Ht _0  (1.626 m)   Wt 185 lb 9.6 oz (84.2 kg)   SpO2 100%   BMI 31.86 kg/m  Physical Exam Vitals and nursing note reviewed.  Constitutional:      General: She is not in acute distress.    Appearance: Normal appearance.  Eyes:     Extraocular Movements:  Extraocular movements intact.     Conjunctiva/sclera: Conjunctivae normal.     Pupils: Pupils are equal, round, and reactive to light.  Neck:     Vascular: No carotid bruit.  Cardiovascular:     Rate and Rhythm: Normal rate and regular rhythm.     Pulses: Normal pulses.     Heart sounds: Normal heart sounds. No murmur heard. Pulmonary:     Effort: Pulmonary effort is normal.     Breath sounds: Normal breath sounds. No wheezing.  Abdominal:     General: Bowel sounds are normal.     Palpations: Abdomen is soft.  Musculoskeletal:        General: Normal range of motion.     Cervical back: Normal range of motion.     Right lower leg: No edema.     Left lower leg: No edema.  Skin:    General: Skin is warm and dry.     Capillary Refill: Capillary refill takes less than 2 seconds.  Neurological:     General: No focal deficit present.     Mental Status: She is alert and oriented to person, place, and time.  Psychiatric:        Mood and Affect: Mood normal.        Behavior: Behavior normal.        Thought Content: Thought content normal.        Judgment: Judgment normal.     Results for orders placed or performed in visit on 11/03/21  CBC with Differential/Platelet  Result Value Ref Range   WBC 4.7 3.4 - 10.8 x10E3/uL   RBC 4.45 3.77 - 5.28 x10E6/uL   Hemoglobin 12.9 11.1 - 15.9 g/dL   Hematocrit 39.1 34.0 - 46.6 %   MCV 88 79 - 97 fL   MCH 29.0 26.6 - 33.0 pg   MCHC 33.0 31.5 - 35.7 g/dL   RDW 11.8 11.7 - 15.4 %   Platelets 294 150 - 450 x10E3/uL   Neutrophils 46 Not Estab. %  Lymphs 39 Not Estab. %   Monocytes 8 Not Estab. %   Eos 6 Not Estab. %   Basos 1 Not Estab. %   Neutrophils Absolute 2.2 1.4 - 7.0 x10E3/uL   Lymphocytes Absolute 1.8 0.7 - 3.1 x10E3/uL   Monocytes Absolute 0.4 0.1 - 0.9 x10E3/uL   EOS (ABSOLUTE) 0.3 0.0 - 0.4 x10E3/uL   Basophils Absolute 0.1 0.0 - 0.2 x10E3/uL   Immature Granulocytes 0 Not Estab. %   Immature Grans (Abs) 0.0 0.0 - 0.1 x10E3/uL   Comprehensive metabolic panel  Result Value Ref Range   Glucose 87 70 - 99 mg/dL   BUN 5 (L) 6 - 24 mg/dL   Creatinine, Ser 0.68 0.57 - 1.00 mg/dL   eGFR 111 >59 mL/min/1.73   BUN/Creatinine Ratio 7 (L) 9 - 23   Sodium 138 134 - 144 mmol/L   Potassium 4.1 3.5 - 5.2 mmol/L   Chloride 104 96 - 106 mmol/L   CO2 20 20 - 29 mmol/L   Calcium 8.9 8.7 - 10.2 mg/dL   Total Protein 7.2 6.0 - 8.5 g/dL   Albumin 4.1 3.9 - 4.9 g/dL   Globulin, Total 3.1 1.5 - 4.5 g/dL   Albumin/Globulin Ratio 1.3 1.2 - 2.2   Bilirubin Total 0.3 0.0 - 1.2 mg/dL   Alkaline Phosphatase 82 44 - 121 IU/L   AST 15 0 - 40 IU/L   ALT 8 0 - 32 IU/L  Hemoglobin A1c  Result Value Ref Range   Hgb A1c MFr Bld 5.8 (H) 4.8 - 5.6 %   Est. average glucose Bld gHb Est-mCnc 120 mg/dL  VITAMIN D 25 Hydroxy (Vit-D Deficiency, Fractures)  Result Value Ref Range   Vit D, 25-Hydroxy 24.1 (L) 30.0 - 100.0 ng/mL  TSH  Result Value Ref Range   TSH 1.680 0.450 - 4.500 uIU/mL  T4, free  Result Value Ref Range   Free T4 0.97 0.82 - 1.77 ng/dL  LP+LDL Direct  Result Value Ref Range   Cholesterol, Total 151 100 - 199 mg/dL   Triglycerides 93 0 - 149 mg/dL   HDL 60 >39 mg/dL   VLDL Cholesterol Cal 17 5 - 40 mg/dL   LDL Chol Calc (NIH) 74 0 - 99 mg/dL   LDL Direct 80 0 - 99 mg/dL    Assessment & Plan      Problem List Items Addressed This Visit     Attention deficit hyperactivity disorder (ADHD), predominantly inattentive type - Primary    Chronic.  Given her history of bipolar disorder and exacerbation of mania with use of albuterol we will avoid this at this time.  We did discuss the options of alternative medications that may be helpful for her.  Unfortunately her insurance company would not approve the use of Concerta for her.  We did discuss the option of Vyvanse and the mechanism of action of this medication with reduced side effects.  She is interested in trialing this medication.  We will send in short acting Ritalin for  use in the evenings if her symptoms need control at that time and the Vyvanse has worn off.  We will plan to follow-up in the next couple of weeks to see how medication is working and if changes are needed.      Relevant Medications   lisdexamfetamine (VYVANSE) 20 MG capsule   Vision changes    Patient has not had an eye exam in quite some time.  She is experiencing some visual deficits with  up close vision.  We will send referral for ophthalmology for evaluation and recommendations.      Relevant Orders   Ambulatory referral to Ophthalmology   Generalized anxiety disorder    Generalized anxiety disorder present in the setting of predominant depression.  Underlying bipolar disorder is suspected as well.  I do not have the patient's mental health records at this time however it does sound like the patient has been on multiple medications and very high doses in the past.  We would like to avoid multiple medications for treatment if at all possible.  At this time we will continue with her current medication and dose and plan to see how she is feeling once her ADHD symptoms have been well controlled over the next couple of weeks.      Relevant Medications   citalopram (CELEXA) 20 MG tablet   Other Relevant Orders   CBC with Differential/Platelet (Completed)   Comprehensive metabolic panel (Completed)   Hemoglobin A1c (Completed)   VITAMIN D 25 Hydroxy (Vit-D Deficiency, Fractures) (Completed)   TSH (Completed)   T4, free (Completed)   LP+LDL Direct (Completed)   Pre-diabetes    Labs show the presence of prediabetes.  Recommendations for diet and exercise provided.      Relevant Orders   CBC with Differential/Platelet (Completed)   Comprehensive metabolic panel (Completed)   Hemoglobin A1c (Completed)   VITAMIN D 25 Hydroxy (Vit-D Deficiency, Fractures) (Completed)   TSH (Completed)   T4, free (Completed)   LP+LDL Direct (Completed)   Mild intermittent asthma without complication     Chronic in nature.  Well-controlled at this time with as needed albuterol.  Exacerbations typically only with increased heat.  Recommend follow-up if symptoms worsen.      Relevant Medications   albuterol (VENTOLIN HFA) 108 (90 Base) MCG/ACT inhaler   Intractable episodic headache    Intermittent headache with good control with ibuprofen 800 mg.  We will send refill today.      Relevant Medications   citalopram (CELEXA) 20 MG tablet   ibuprofen (ADVIL) 800 MG tablet   Depression, recurrent (HCC)    Chronic recurrent depressive disorder.  Patient does endorse a history of bipolar disorder as well.  She has been on Celexa for an extended period of time and does not feel the medication is working as well as it has in the past.  She does have a history of multiple mental health medications with most of them being utilized in high doses and more than 1 medication type at a time which certainly can cause significant negative symptoms.  Discussion with her today on option to continue with her current medication choice or make changes.  At this time she would like to continue her current medication as she is fearful of change but we will plan to rediscuss this at her follow-up visit in the next couple of weeks.  No alarm symptoms present today.      Relevant Medications   citalopram (CELEXA) 20 MG tablet   Vitamin D deficiency    Based on lab results.  Recommend replacement therapy.      Relevant Orders   VITAMIN D 25 Hydroxy (Vit-D Deficiency, Fractures) (Completed)   Other Visit Diagnoses     Healthcare maintenance       Relevant Orders   CBC with Differential/Platelet (Completed)   Comprehensive metabolic panel (Completed)   Hemoglobin A1c (Completed)   VITAMIN D 25 Hydroxy (Vit-D Deficiency, Fractures) (Completed)   TSH (Completed)  T4, free (Completed)   LP+LDL Direct (Completed)        Return in about 4 weeks (around 12/01/2021) for Virtual Visit- end of day- for f/uADHD and  Mood.      Alleen Kehm, Coralee Pesa, NP, DNP, AGNP-C [facility] at [facility]

## 2021-11-03 NOTE — Patient Instructions (Addendum)
Thank you for choosing [place] [facility] for your Primary Care needs. I am excited for the opportunity to partner with you to meet your health care goals. It was a pleasure meeting you today!  Recommendations from today's visit: If you feel like your anxiety is getting worse, please let me know - hopefully starting on the ADHD medication will help with this. I will be in touch with you once we get your labs back to let you know how things are looking I have sent in a refill of your medications    Information on diet, exercise, and health maintenance recommendations are listed below. This is information to help you be sure you are on track for optimal health and monitoring.   Please look over this and let us know if you have any questions or if you have completed any of the health maintenance outside of [place] so that we can be sure your records are up to date.  ___________________________________________________________ About Me: I am an Adult-Geriatric Nurse Practitioner with a background in caring for patients for more than 20 years with a strong intensive care background. I provide primary care and sports medicine services to patients age 21 and older within this office. My education had a strong focus on caring for the older adult population, which I am passionate about. I am also the director of the APP Fellowship with [facility]   My desire is to provide you with the best service through preventive medicine and supportive care. I consider you a part of the medical team and value your input. I work diligently to ensure that you are heard and your needs are met in a safe and effective manner. I want you to feel comfortable with me as your provider and want you to know that your health concerns are important to me.  For your information, our office hours are: Monday, Tuesday, and Thursday 8:00 AM - 5:00 PM Wednesday and Friday 8:00 AM - 12:00 PM.   In my  time away from the office I am teaching new APP's within the system and am unavailable, but my partner, Dr. Burnard Bunting is in the office for emergent needs.   If you have questions or concerns, please call our office at [phone] or send [place] a MyChart message and we will respond as quickly as possible.  ____________________________________________________________ MyChart:  For all urgent or time sensitive needs we ask that you please call the office to avoid delays. Our number is [phone]. MyChart is not constantly monitored and due to the large volume of messages a day, replies may take up to 72 business hours.  MyChart Policy: MyChart allows for you to see your visit notes, after visit summary, provider recommendations, lab and tests results, make an appointment, request refills, and contact your provider or the office for non-urgent questions or concerns. Providers are seeing patients during normal business hours and do not have built in time to review MyChart messages.  We ask that you allow a minimum of 3 business days for responses to [employer]. For this reason, please do not send urgent requests through [place]. Please call the office at [phone]. New and ongoing conditions may require a visit. We have virtual and in person visit available for your convenience.  Complex MyChart concerns may require a visit. Your provider may request you schedule a virtual or in person visit to ensure we are providing the best care possible. MyChart messages sent after 11:00 AM  on Friday will not be received by the provider until Monday morning.    Lab and Test Results: You will receive your lab and test results on MyChart as soon as they are completed and results have been sent by the lab or testing facility. Due to this service, you will receive your results BEFORE your provider.  I review lab and tests results each morning prior to seeing patients. Some results require collaboration with other  providers to ensure you are receiving the most appropriate care. For this reason, we ask that you please allow a minimum of 3-5 business days from the time the ALL results have been received for your provider to receive and review lab and test results and contact you about these.  Most lab and test result comments from the provider will be sent through [place]. Your provider may recommend changes to the plan of care, follow-up visits, repeat testing, ask questions, or request an office visit to discuss these results. You may reply directly to this message or call the office at [phone] to provide information for the provider or set up an appointment. In some instances, you will be called with test results and recommendations. Please let us know if this is preferred and we will make note of this in your chart to provide this for you.    If you have not heard a response to your lab or test results in 5 business days from all results returning to [place], please call the office to let us know. We ask that you please avoid calling prior to this time unless there is an emergent concern. Due to high call volumes, this can delay the resulting process.  After Hours: For all non-emergency after hours needs, please call the office at [phone] and select the option to reach the on-call provider service. On-call services are shared between multiple [place] offices and therefore it will not be possible to speak directly with your provider. On-call providers may provide medical advice and recommendations, but are unable to provide refills for maintenance medications.  For all emergency or urgent medical needs after normal business hours, we recommend that you seek care at the closest Urgent Care or Emergency Department to ensure appropriate treatment in a timely manner.  [facility] has a 24 hour emergency room located on the ground floor for your convenience.   Urgent Concerns During  the Business Day Providers are seeing patients from 8AM to [place] with a busy schedule and are most often not able to respond to non-urgent calls until the end of the day or the next business day. If you should have URGENT concerns during the day, please call and speak to the nurse or schedule a same day appointment so that we can address your concern without delay.   Thank you, again, for choosing me as your health care partner. I appreciate your trust and look forward to learning more about you.   Worthy Keeler, DNP, AGNP-c ___________________________________________________________  Health Maintenance Recommendations Screening Testing Mammogram Every 1 -2 years based on history and risk factors Starting at age 59 Pap Smear Ages 21-39 every 3 years Ages 3-65 every 5 years with HPV testing More frequent testing may be required based on results and history Colon Cancer Screening Every 1-10 years based on test performed, risk factors, and history Starting at age 71 Bone Density Screening Every 2-10 years based on history Starting at age 74 for women Recommendations for men differ based on medication usage,  history, and risk factors AAA Screening One time ultrasound Men 24-86 years old who have every smoked Lung Cancer Screening Low Dose Lung CT every 12 months Age 9-80 years with a 30 pack-year smoking history who still smoke or who have quit within the last 15 years  Screening Labs Routine  Labs: Complete Blood Count (CBC), Complete Metabolic Panel (CMP), Cholesterol (Lipid Panel) Every 6-12 months based on history and medications May be recommended more frequently based on current conditions or previous results Hemoglobin A1c Lab Every 3-12 months based on history and previous results Starting at age 68 or earlier with diagnosis of diabetes, high cholesterol, BMI >26, and/or risk factors Frequent monitoring for patients with diabetes to ensure blood sugar control Thyroid  Panel (TSH w/ T3 & T4) Every 6 months based on history, symptoms, and risk factors May be repeated more often if on medication HIV One time testing for all patients 57 and older May be repeated more frequently for patients with increased risk factors or exposure Hepatitis C One time testing for all patients 48 and older May be repeated more frequently for patients with increased risk factors or exposure Gonorrhea, Chlamydia Every 12 months for all sexually active persons 13-24 years Additional monitoring may be recommended for those who are considered high risk or who have symptoms PSA Men 69-9 years old with risk factors Additional screening may be recommended from age 39-69 based on risk factors, symptoms, and history  Vaccine Recommendations Tetanus Booster All adults every 10 years Flu Vaccine All patients 6 months and older every year COVID Vaccine All patients 12 years and older Initial dosing with booster May recommend additional booster based on age and health history HPV Vaccine 2 doses all patients age 4-26 Dosing may be considered for patients over 26 Shingles Vaccine (Shingrix) 2 doses all adults 41 years and older Pneumonia (Pneumovax 23) All adults 33 years and older May recommend earlier dosing based on health history Pneumonia (Prevnar 72) All adults 31 years and older Dosed 1 year after Pneumovax 23  Additional Screening, Testing, and Vaccinations may be recommended on an individualized basis based on family history, health history, risk factors, and/or exposure.  __________________________________________________________  Diet Recommendations for All Patients  I recommend that all patients maintain a diet low in saturated fats, carbohydrates, and cholesterol. While this can be challenging at first, it is not impossible and small changes can make big differences.  Things to try: Decreasing the amount of soda, sweet tea, and/or juice to one or less per day  and replace with water While water is always the first choice, if you do not like water you may consider adding a water additive without sugar to improve the taste other sugar free drinks Replace potatoes with a brightly colored vegetable at dinner Use healthy oils, such as canola oil or olive oil, instead of butter or hard margarine Limit your bread intake to two pieces or less a day Replace regular pasta with low carb pasta options Bake, broil, or grill foods instead of frying Monitor portion sizes  Eat smaller, more frequent meals throughout the day instead of large meals  An important thing to remember is, if you love foods that are not great for your health, you don't have to give them up completely. Instead, allow these foods to be a reward when you have done well. Allowing yourself to still have special treats every once in a while is a nice way to tell yourself thank you for working hard  to keep yourself healthy.   Also remember that every day is a new day. If you have a bad day and "fall off the wagon", you can still climb right back up and keep moving along on your journey!  We have resources available to help you!  Some websites that may be helpful include: www.http://carter.biz/  Www.VeryWellFit.com _____________________________________________________________  Activity Recommendations for All Patients  I recommend that all adults get at least 20 minutes of moderate physical activity that elevates your heart rate at least 5 days out of the week.  Some examples include: Walking or jogging at a pace that allows you to carry on a conversation Cycling (stationary bike or outdoors) Water aerobics Yoga Weight lifting Dancing If physical limitations prevent you from putting stress on your joints, exercise in a pool or seated in a chair are excellent options.  Do determine your MAXIMUM heart rate for activity: YOUR AGE - 220 = MAX HeartRate   Remember! Do not push yourself too hard.   Start slowly and build up your pace, speed, weight, time in exercise, etc.  Allow your body to rest between exercise and get good sleep. You will need more water than normal when you are exerting yourself. Do not wait until you are thirsty to drink. Drink with a purpose of getting in at least 8, 8 ounce glasses of water a day plus more depending on how much you exercise and sweat.    If you begin to develop dizziness, chest pain, abdominal pain, jaw pain, shortness of breath, headache, vision changes, lightheadedness, or other concerning symptoms, stop the activity and allow your body to rest. If your symptoms are severe, seek emergency evaluation immediately. If your symptoms are concerning, but not severe, please let us know so that we can recommend further evaluation.

## 2021-11-04 LAB — COMPREHENSIVE METABOLIC PANEL
ALT: 8 IU/L (ref 0–32)
AST: 15 IU/L (ref 0–40)
Albumin/Globulin Ratio: 1.3 (ref 1.2–2.2)
Albumin: 4.1 g/dL (ref 3.9–4.9)
Alkaline Phosphatase: 82 IU/L (ref 44–121)
BUN/Creatinine Ratio: 7 — ABNORMAL LOW (ref 9–23)
BUN: 5 mg/dL — ABNORMAL LOW (ref 6–24)
Bilirubin Total: 0.3 mg/dL (ref 0.0–1.2)
CO2: 20 mmol/L (ref 20–29)
Calcium: 8.9 mg/dL (ref 8.7–10.2)
Chloride: 104 mmol/L (ref 96–106)
Creatinine, Ser: 0.68 mg/dL (ref 0.57–1.00)
Globulin, Total: 3.1 g/dL (ref 1.5–4.5)
Glucose: 87 mg/dL (ref 70–99)
Potassium: 4.1 mmol/L (ref 3.5–5.2)
Sodium: 138 mmol/L (ref 134–144)
Total Protein: 7.2 g/dL (ref 6.0–8.5)
eGFR: 111 mL/min/{1.73_m2} (ref >59)

## 2021-11-04 LAB — CBC WITH DIFFERENTIAL/PLATELET
Basophils Absolute: 0.1 10*3/uL (ref 0.0–0.2)
Basos: 1 %
EOS (ABSOLUTE): 0.3 10*3/uL (ref 0.0–0.4)
Eos: 6 %
Hematocrit: 39.1 % (ref 34.0–46.6)
Hemoglobin: 12.9 g/dL (ref 11.1–15.9)
Immature Grans (Abs): 0 10*3/uL (ref 0.0–0.1)
Immature Granulocytes: 0 %
Lymphocytes Absolute: 1.8 10*3/uL (ref 0.7–3.1)
Lymphs: 39 %
MCH: 29 pg (ref 26.6–33.0)
MCHC: 33 g/dL (ref 31.5–35.7)
MCV: 88 fL (ref 79–97)
Monocytes Absolute: 0.4 10*3/uL (ref 0.1–0.9)
Monocytes: 8 %
Neutrophils Absolute: 2.2 10*3/uL (ref 1.4–7.0)
Neutrophils: 46 %
Platelets: 294 10*3/uL (ref 150–450)
RBC: 4.45 x10E6/uL (ref 3.77–5.28)
RDW: 11.8 % (ref 11.7–15.4)
WBC: 4.7 10*3/uL (ref 3.4–10.8)

## 2021-11-04 LAB — T4, FREE: Free T4: 0.97 ng/dL (ref 0.82–1.77)

## 2021-11-04 LAB — TSH: TSH: 1.68 u[IU]/mL (ref 0.450–4.500)

## 2021-11-04 LAB — HEMOGLOBIN A1C
Est. average glucose Bld gHb Est-mCnc: 120 mg/dL
Hgb A1c MFr Bld: 5.8 % — ABNORMAL HIGH (ref 4.8–5.6)

## 2021-11-04 LAB — LP+LDL DIRECT
Cholesterol, Total: 151 mg/dL (ref 100–199)
HDL: 60 mg/dL (ref >39)
LDL Chol Calc (NIH): 74 mg/dL (ref 0–99)
LDL Direct: 80 mg/dL (ref 0–99)
Triglycerides: 93 mg/dL (ref 0–149)
VLDL Cholesterol Cal: 17 mg/dL (ref 5–40)

## 2021-11-04 LAB — VITAMIN D 25 HYDROXY (VIT D DEFICIENCY, FRACTURES): Vit D, 25-Hydroxy: 24.1 ng/mL — ABNORMAL LOW (ref 30.0–100.0)

## 2021-11-07 ENCOUNTER — Encounter (HOSPITAL_BASED_OUTPATIENT_CLINIC_OR_DEPARTMENT_OTHER): Payer: Self-pay | Admitting: Nurse Practitioner

## 2021-11-07 DIAGNOSIS — F339 Major depressive disorder, recurrent, unspecified: Secondary | ICD-10-CM

## 2021-11-07 DIAGNOSIS — F411 Generalized anxiety disorder: Secondary | ICD-10-CM

## 2021-11-10 MED ORDER — LISDEXAMFETAMINE DIMESYLATE 20 MG PO CAPS: 20.0000 mg | ORAL_CAPSULE | Freq: Every day | ORAL | 0 refills | Status: DC

## 2021-11-29 ENCOUNTER — Encounter (HOSPITAL_BASED_OUTPATIENT_CLINIC_OR_DEPARTMENT_OTHER): Payer: Self-pay | Admitting: Nurse Practitioner

## 2021-12-08 ENCOUNTER — Encounter (HOSPITAL_BASED_OUTPATIENT_CLINIC_OR_DEPARTMENT_OTHER): Payer: Self-pay | Admitting: Nurse Practitioner

## 2021-12-08 ENCOUNTER — Ambulatory Visit (INDEPENDENT_AMBULATORY_CARE_PROVIDER_SITE_OTHER): Payer: Medicare Other | Admitting: Nurse Practitioner

## 2021-12-08 DIAGNOSIS — F332 Major depressive disorder, recurrent severe without psychotic features: Secondary | ICD-10-CM

## 2021-12-08 MED ORDER — BUPROPION HCL 75 MG PO TABS: 75.0000 mg | ORAL_TABLET | Freq: Two times a day (BID) | ORAL | 3 refills | Status: DC

## 2021-12-08 NOTE — Progress Notes (Signed)
Virtual Visit Encounter telephone visit.   I connected with  [name] on 12/08/21 at  3:10 PM EDT by secure audio telemedicine application. I verified that I am speaking with the correct person using two identifiers.   I introduced myself as a Designer, jewellery with the practice. The limitations of evaluation and management by telemedicine discussed with the patient and the availability of in person appointments. The patient expressed verbal understanding and consent to proceed.  Participating parties in this visit include: Myself and patient  The patient is: Patient Location: Home I am: Provider Location: Office/Clinic Subjective:    CC and HPI: [name] is a 41 y.o. year old female presenting for follow up of mood. Patient reports the following: Mood [name] has been on Celexa for quite some time for her mood. Recently, she reports, the medication is not working well for her and she has been feeling more depressed. She has recently had two children leave for college and that has been particularly hard on her.  In the past she has tried numerous medications for mood and has had negative side effects, however, she tells me that she would always be on high doses and start 2-3 medications at the same time so she is not sure which medications affected her.  She is trying to work on her weight and manage her blood sugars better and she would like to be on a medication that will not cause weight gain. She does not want to increase her celexa dose for that reason.   Past medical history, Surgical history, Family history not pertinant except as noted below, Social history, Allergies, and medications have been entered into the medical record, reviewed, and corrections made.   Review of Systems:  All review of systems negative except what is listed in the HPI  Objective:    Alert and oriented x 4 Speaking in clear sentences with no shortness of breath. No  distress.  Impression and Recommendations:    Problem List Items Addressed This Visit     Major depressive disorder, recurrent severe without psychotic features ([place]) - Primary    Recurrent depressive disorder with no alarm symptoms present at this time. Recent changes with children going away to college have exacerbated her depression symptoms. Discussion with patient that this can be a normal occurrence when such difficult life changes present. Typically I would recommend an increase in the current medication rather than a change, specifically if the patient has had success with the current medication up to this point, but given the concern with weight gain, we did discuss options that may be helpful.  Joint decision to stop celexa and start bupropion. We will start '75mg'$  BID formulation to help offset any side effects from rapid dose increase and monitor for any side effects. She will f/u in 2 weeks with MyChart check-in and we will plan for follow-up routinely following until stable. If there are any negative effects of medication, she will let me know immediately.       Relevant Medications   buPROPion (WELLBUTRIN) 75 MG tablet    orders and follow up as documented in EMR I discussed the assessment and treatment plan with the patient. The patient was provided an opportunity to ask questions and all were answered. The patient agreed with the plan and demonstrated an understanding of the instructions.   The patient was advised to call back or seek an in-person evaluation if the symptoms worsen or if the condition fails to  improve as anticipated.  Follow-Up: [facility] message in 2 weeks to see how medication is working  I provided 17 minutes of non-face-to-face interaction with this non face-to-face encounter including intake, same-day documentation, and chart review.   Orma Render, NP , DNP, AGNP-c [place] [facility] [phone] (fax)

## 2021-12-08 NOTE — Assessment & Plan Note (Signed)
Recurrent depressive disorder with no alarm symptoms present at this time. Recent changes with children going away to college have exacerbated her depression symptoms. Discussion with patient that this can be a normal occurrence when such difficult life changes present. Typically I would recommend an increase in the current medication rather than a change, specifically if the patient has had success with the current medication up to this point, but given the concern with weight gain, we did discuss options that may be helpful.  Joint decision to stop celexa and start bupropion. We will start '75mg'$  BID formulation to help offset any side effects from rapid dose increase and monitor for any side effects. She will f/u in 2 weeks with MyChart check-in and we will plan for follow-up routinely following until stable. If there are any negative effects of medication, she will let me know immediately.

## 2021-12-08 NOTE — Patient Instructions (Signed)
We will plan to start wellbutrin on low dose short acting formula to prevent side effects.  You can stop the celexa and start the wellbutrin when your next dose of medication is due. I have sent a MyChart message to come to you in about 2 weeks to let me know how you are doing. If you have any side effects or negative symptoms before then, please let me know.

## 2021-12-11 ENCOUNTER — Encounter (HOSPITAL_BASED_OUTPATIENT_CLINIC_OR_DEPARTMENT_OTHER): Payer: Self-pay | Admitting: Nurse Practitioner

## 2021-12-11 DIAGNOSIS — E559 Vitamin D deficiency, unspecified: Secondary | ICD-10-CM | POA: Insufficient documentation

## 2021-12-11 DIAGNOSIS — F410 Panic disorder [episodic paroxysmal anxiety] without agoraphobia: Secondary | ICD-10-CM | POA: Insufficient documentation

## 2021-12-11 DIAGNOSIS — R519 Headache, unspecified: Secondary | ICD-10-CM | POA: Insufficient documentation

## 2021-12-11 DIAGNOSIS — J4520 Mild intermittent asthma, uncomplicated: Secondary | ICD-10-CM | POA: Insufficient documentation

## 2021-12-11 DIAGNOSIS — H539 Unspecified visual disturbance: Secondary | ICD-10-CM

## 2021-12-11 DIAGNOSIS — R7303 Prediabetes: Secondary | ICD-10-CM | POA: Insufficient documentation

## 2021-12-11 DIAGNOSIS — F3181 Bipolar II disorder: Secondary | ICD-10-CM | POA: Insufficient documentation

## 2021-12-11 DIAGNOSIS — F339 Major depressive disorder, recurrent, unspecified: Secondary | ICD-10-CM | POA: Insufficient documentation

## 2021-12-11 DIAGNOSIS — F900 Attention-deficit hyperactivity disorder, predominantly inattentive type: Secondary | ICD-10-CM | POA: Insufficient documentation

## 2021-12-11 DIAGNOSIS — F411 Generalized anxiety disorder: Secondary | ICD-10-CM | POA: Insufficient documentation

## 2021-12-11 HISTORY — DX: Unspecified visual disturbance: H53.9

## 2021-12-11 NOTE — Assessment & Plan Note (Signed)
Patient has not had an eye exam in quite some time.  She is experiencing some visual deficits with up close vision.  We will send referral for ophthalmology for evaluation and recommendations.

## 2021-12-11 NOTE — Assessment & Plan Note (Signed)
Chronic in nature.  Well-controlled at this time with as needed albuterol.  Exacerbations typically only with increased heat.  Recommend follow-up if symptoms worsen.

## 2021-12-11 NOTE — Assessment & Plan Note (Signed)
Generalized anxiety disorder present in the setting of predominant depression.  Underlying bipolar disorder is suspected as well.  I do not have the patient's mental health records at this time however it does sound like the patient has been on multiple medications and very high doses in the past.  We would like to avoid multiple medications for treatment if at all possible.  At this time we will continue with her current medication and dose and plan to see how she is feeling once her ADHD symptoms have been well controlled over the next couple of weeks.

## 2021-12-11 NOTE — Assessment & Plan Note (Signed)
>>  ASSESSMENT AND PLAN FOR GENERALIZED ANXIETY DISORDER WRITTEN ON 12/11/2021  6:07 PM BY Ziaire Hagos E, NP  Generalized anxiety disorder present in the setting of predominant depression.  Underlying bipolar disorder is suspected as well.  I do not have the patient's mental health records at this time however it does sound like the patient has been on multiple medications and very high doses in the past.  We would like to avoid multiple medications for treatment if at all possible.  At this time we will continue with her current medication and dose and plan to see how she is feeling once her ADHD symptoms have been well controlled over the next couple of weeks.

## 2021-12-11 NOTE — Assessment & Plan Note (Signed)
Based on lab results.  Recommend replacement therapy.

## 2021-12-11 NOTE — Assessment & Plan Note (Signed)
Labs show the presence of prediabetes.  Recommendations for diet and exercise provided.

## 2021-12-11 NOTE — Assessment & Plan Note (Signed)
Chronic.  Given her history of bipolar disorder and exacerbation of mania with use of albuterol we will avoid this at this time.  We did discuss the options of alternative medications that may be helpful for her.  Unfortunately her insurance company would not approve the use of Concerta for her.  We did discuss the option of Vyvanse and the mechanism of action of this medication with reduced side effects.  She is interested in trialing this medication.  We will send in short acting Ritalin for use in the evenings if her symptoms need control at that time and the Vyvanse has worn off.  We will plan to follow-up in the next couple of weeks to see how medication is working and if changes are needed.

## 2021-12-11 NOTE — Assessment & Plan Note (Signed)
>>  ASSESSMENT AND PLAN FOR DEPRESSION, RECURRENT (HCC) WRITTEN ON 12/11/2021  6:07 PM BY Kirston Luty E, NP  Chronic recurrent depressive disorder.  Patient does endorse a history of bipolar disorder as well.  She has been on Celexa for an extended period of time and does not feel the medication is working as well as it has in the past.  She does have a history of multiple mental health medications with most of them being utilized in high doses and more than 1 medication type at a time which certainly can cause significant negative symptoms.  Discussion with her today on option to continue with her current medication choice or make changes.  At this time she would like to continue her current medication as she is fearful of change but we will plan to rediscuss this at her follow-up visit in the next couple of weeks.  No alarm symptoms present today.

## 2021-12-11 NOTE — Assessment & Plan Note (Signed)
Intermittent headache with good control with ibuprofen 800 mg.  We will send refill today.

## 2021-12-11 NOTE — Assessment & Plan Note (Signed)
Chronic recurrent depressive disorder.  Patient does endorse a history of bipolar disorder as well.  She has been on Celexa for an extended period of time and does not feel the medication is working as well as it has in the past.  She does have a history of multiple mental health medications with most of them being utilized in high doses and more than 1 medication type at a time which certainly can cause significant negative symptoms.  Discussion with her today on option to continue with her current medication choice or make changes.  At this time she would like to continue her current medication as she is fearful of change but we will plan to rediscuss this at her follow-up visit in the next couple of weeks.  No alarm symptoms present today.

## 2021-12-23 ENCOUNTER — Ambulatory Visit (HOSPITAL_COMMUNITY)
Admission: RE | Admit: 2021-12-23 | Discharge: 2021-12-23 | Disposition: A | Discharge status: Home / Self Care | Payer: Medicare Other | Attending: Psychiatry | Admitting: Psychiatry

## 2021-12-23 NOTE — H&P (Signed)
Behavioral Health Medical Screening Exam  [name] is a 41 y.o. female with past psychiatric history of bipolar who presented to [facility] voluntarily for assessment of increased depressive symptoms and suicidal ideations. Patient initially presented alone then asked provider to have husband come from lobby to join for assessment. Patient reports having recent onset of suicidal thoughts with increased rumination today on her way to the gym. Due to the intensity pt reports calling her husband and 911.   Per chart review pt saw outpatient provider on yesterday where she noted increased depression  She reports provider told her she was in a "manic state" and made medication changes that "didn't work". She is currently reporting decreased sleep (hard to stay asleep), inconsistent interests, increased feelings of guilt and worthlessness, low/poor energy, "bad" concentration, "okay" appetite, no psychomotor changes, and suicidal thoughts today. She reports not feeling safe and came to [facility] "to talk to someone and possible medication change". She denies any active suicidal ideations, intent or plan at the time of assessment; won't elaborate on possible plans from earlier. Denies any access to weapons; husband confirms. Provider discussed patient's overall state despite outpatient engagement, safety, and recommendation for inpatient hospitalization. Patient declines stating it will "make her worse being locked up without being able to move around". Provider discussed at length safety concerns given reported symptoms; patient and husband contract for safety and state plan to return if anything changes.   Total Time spent with patient: 30 minutes  Psychiatric Specialty Exam:  Presentation  General Appearance: No data recorded Eye Contact:No data recorded Speech:No data recorded Speech Volume:No data recorded Handedness:No data recorded  Mood and Affect  Mood:No data recorded Affect:No data  recorded  Thought Process  Thought Processes:No data recorded Descriptions of Associations:No data recorded Orientation:No data recorded Thought Content:No data recorded History of Schizophrenia/Schizoaffective disorder:No data recorded Duration of Psychotic Symptoms:No data recorded Hallucinations:No data recorded Ideas of Reference:No data recorded Suicidal Thoughts:No data recorded Homicidal Thoughts:No data recorded  Sensorium  Memory:No data recorded Judgment:No data recorded Insight:No data recorded  Executive Functions  Concentration:No data recorded Attention Span:No data recorded Recall:No data recorded Fund of Knowledge:No data recorded Language:No data recorded  Psychomotor Activity  Psychomotor Activity:No data recorded  Assets  Assets:No data recorded  Sleep  Sleep:No data recorded   Physical Exam: Physical Exam Vitals and nursing note reviewed.  Constitutional:      General: She is not in acute distress.    Appearance: She is not ill-appearing or toxic-appearing.  HENT:     Head: Normocephalic.     Nose: Nose normal.     Mouth/Throat:     Mouth: Mucous membranes are moist.     Pharynx: Oropharynx is clear.  Eyes:     Pupils: Pupils are equal, round, and reactive to light.  Cardiovascular:     Rate and Rhythm: Normal rate.     Pulses: Normal pulses.  Pulmonary:     Effort: Pulmonary effort is normal.  Abdominal:     Palpations: Abdomen is soft.  Musculoskeletal:        General: Normal range of motion.     Cervical back: Normal range of motion.  Skin:    General: Skin is warm and dry.  Neurological:     Mental Status: She is alert and oriented to person, place, and time.  Psychiatric:        Attention and Perception: Attention and perception normal. She does not perceive auditory or visual hallucinations.  Mood and Affect: Mood is depressed. Affect is flat.        Speech: Speech normal.        Behavior: Behavior is withdrawn.  Behavior is cooperative.        Thought Content: Thought content is not paranoid or delusional. Thought content includes suicidal ideation. Thought content does not include homicidal ideation. Thought content does not include homicidal or suicidal plan.        Cognition and Memory: Cognition and memory normal.        Judgment: Judgment is impulsive and inappropriate.    ROS Blood pressure 113/85, pulse 62, temperature 98.5 F (36.9 C), temperature source Oral, resp. rate 16, SpO2 100 %. There is no height or weight on file to calculate BMI.  Musculoskeletal: Strength & Muscle Tone: within normal limits Gait & Station: normal Patient leans: N/A  [place] Scale:  [place] [facility] from 09/06/2021 in [facility] from 04/01/2021 in [facility] ED from 02/16/2021 in [facility] [place] No Risk No Risk No Risk       Recommendations:  Based on my evaluation the patient does not appear to have an emergency medical condition. Provider recommended inpatient hospitalization; patient declined despite several efforts. Husband acknowledged education by provider; states he feels he is able to keep pt safe in the home. Patient discharged with plan to follow up with outpatient provider for medication adjustments.   Inda Merlin, NP 12/23/2021, 11:11 AM

## 2021-12-29 MED ORDER — CITALOPRAM HYDROBROMIDE 40 MG PO TABS: 40.0000 mg | ORAL_TABLET | Freq: Every day | ORAL | 3 refills | Status: DC

## 2021-12-30 ENCOUNTER — Other Ambulatory Visit (HOSPITAL_BASED_OUTPATIENT_CLINIC_OR_DEPARTMENT_OTHER): Payer: Self-pay | Admitting: Nurse Practitioner

## 2021-12-30 DIAGNOSIS — F411 Generalized anxiety disorder: Secondary | ICD-10-CM

## 2021-12-30 DIAGNOSIS — F332 Major depressive disorder, recurrent severe without psychotic features: Secondary | ICD-10-CM

## 2021-12-30 DIAGNOSIS — F339 Major depressive disorder, recurrent, unspecified: Secondary | ICD-10-CM

## 2021-12-30 NOTE — Telephone Encounter (Signed)
Referral to psychiatry per provider and patient. Follow up and communicate with patient. Report to provider.

## 2022-01-07 ENCOUNTER — Other Ambulatory Visit (HOSPITAL_BASED_OUTPATIENT_CLINIC_OR_DEPARTMENT_OTHER): Payer: Self-pay | Admitting: Nurse Practitioner

## 2022-02-21 ENCOUNTER — Encounter (HOSPITAL_BASED_OUTPATIENT_CLINIC_OR_DEPARTMENT_OTHER): Payer: Self-pay | Admitting: Nurse Practitioner

## 2022-02-21 ENCOUNTER — Ambulatory Visit (INDEPENDENT_AMBULATORY_CARE_PROVIDER_SITE_OTHER): Payer: Medicare Other | Admitting: Nurse Practitioner

## 2022-02-21 VITALS — BP 116/78 | HR 81 | Ht 64.0 in | Wt 191.5 lb

## 2022-02-21 DIAGNOSIS — R202 Paresthesia of skin: Secondary | ICD-10-CM | POA: Diagnosis not present

## 2022-02-21 DIAGNOSIS — R7301 Impaired fasting glucose: Secondary | ICD-10-CM | POA: Diagnosis not present

## 2022-02-21 DIAGNOSIS — R5383 Other fatigue: Secondary | ICD-10-CM | POA: Diagnosis not present

## 2022-02-21 DIAGNOSIS — R631 Polydipsia: Secondary | ICD-10-CM

## 2022-02-21 DIAGNOSIS — Z6832 Body mass index (BMI) 32.0-32.9, adult: Secondary | ICD-10-CM

## 2022-02-21 DIAGNOSIS — E559 Vitamin D deficiency, unspecified: Secondary | ICD-10-CM

## 2022-02-21 HISTORY — DX: Other fatigue: R53.83

## 2022-02-21 NOTE — Assessment & Plan Note (Signed)
Symptoms of fatigue, paresthesias, weakness, headaches, and sensitivity to cold of unknown etiology.  Patient does have a history of prediabetes which could be a contributing symptom given the increased hunger and cravings for carbohydrates heavy foods.  No alarm symptoms present today.  Examination benign.  We will obtain labs today for evaluation determine next best steps based on findings.

## 2022-02-21 NOTE — Patient Instructions (Addendum)
I want to get a few labs today to check and see what could be causing your tiredness and other symptoms. Once we get these back we will have a better idea how to treat whatever is going on. If you notice any new symptoms in the meantime, you can send me a message to let me know.   In the meantime, I want you to rest and keep drinking water. Be sure you are not eating too much junk food or sweets as this can make your sugar go high.

## 2022-02-21 NOTE — Progress Notes (Signed)
Orma Render, DNP, AGNP-c [facility] [address]  [place] [address] [phone]  Subjective:   [name] is a 41 y.o. female presents to day for evaluation of: Acute Visit (Pt presents today for headaches and extremely tired)  Symptoms of: Tired Burning and tingling in hands and feet- she tells me this comes and goes No matter what she does she feels exhausted She tells me that she also feels weak She has been having migraines  She tells me she has been eating poorly- this has happened in the past when her diet was not good.  No cold or flu symptoms, no fever or chills.  She tells me she is always cold, she tells me she is always thirsty even after drinking a lot of water, she also tells me that recently she has found her self staying constantly hungry and craving junk foods.   PMH, Medications, and Allergies reviewed and updated in chart as appropriate.   ROS negative except for what is listed in HPI. Objective:  BP 116/78   Pulse 81   Ht '5\' 4"'$  (1.626 m)   Wt 191 lb 8 oz (86.9 kg)   SpO2 100%   BMI 32.87 kg/m  Physical Exam Vitals and nursing note reviewed.  Constitutional:      General: She is not in acute distress.    Appearance: Normal appearance.  HENT:     Head: Normocephalic.  Eyes:     Extraocular Movements: Extraocular movements intact.     Conjunctiva/sclera: Conjunctivae normal.     Pupils: Pupils are equal, round, and reactive to light.  Neck:     Vascular: No carotid bruit.  Cardiovascular:     Rate and Rhythm: Normal rate and regular rhythm.     Pulses: Normal pulses.     Heart sounds: Normal heart sounds. No murmur heard. Pulmonary:     Effort: Pulmonary effort is normal.     Breath sounds: Normal breath sounds. No wheezing.  Abdominal:     General: Bowel sounds are normal. There is no distension.     Palpations: Abdomen is soft.     Tenderness: There is no abdominal  tenderness. There is no guarding.  Musculoskeletal:        General: Normal range of motion.     Cervical back: Normal range of motion and neck supple.     Right lower leg: No edema.     Left lower leg: No edema.  Lymphadenopathy:     Cervical: No cervical adenopathy.  Skin:    General: Skin is warm and dry.     Capillary Refill: Capillary refill takes less than 2 seconds.  Neurological:     General: No focal deficit present.     Mental Status: She is alert and oriented to person, place, and time.  Psychiatric:        Mood and Affect: Mood normal.        Behavior: Behavior normal.        Thought Content: Thought content normal.        Judgment: Judgment normal.           Assessment & Plan:   Problem List Items Addressed This Visit     Other fatigue - Primary    Symptoms of fatigue, paresthesias, weakness, headaches, and sensitivity to cold of unknown etiology.  Patient does have a history of prediabetes which could be a contributing symptom given the  increased hunger and cravings for carbohydrates heavy foods.  No alarm symptoms present today.  Examination benign.  We will obtain labs today for evaluation determine next best steps based on findings.      Relevant Orders   Hemoglobin A1c   Comprehensive metabolic panel   CBC With Diff/Platelet   B12 and Folate Panel   Iron, TIBC and Ferritin Panel   VITAMIN D 25 Hydroxy (Vit-D Deficiency, Fractures)   TSH   T4, free   Other Visit Diagnoses     Increased thirst       Relevant Orders   Hemoglobin A1c   Comprehensive metabolic panel   CBC With Diff/Platelet   B12 and Folate Panel   Iron, TIBC and Ferritin Panel   VITAMIN D 25 Hydroxy (Vit-D Deficiency, Fractures)   TSH   T4, free   Paresthesias       Relevant Orders   Hemoglobin A1c   Comprehensive metabolic panel   CBC With Diff/Platelet   B12 and Folate Panel   Iron, TIBC and Ferritin Panel   VITAMIN D 25 Hydroxy (Vit-D Deficiency, Fractures)   TSH   T4,  free   Impaired fasting glucose       Relevant Orders   Hemoglobin A1c   Body mass index (BMI) 32.0-32.9, adult       Relevant Orders   VITAMIN D 25 Hydroxy (Vit-D Deficiency, Fractures)         Orma Render, DNP, AGNP-c 02/21/2022  1:34 PM    History, Medications, Surgery, SDOH, and Family History reviewed and updated as appropriate.

## 2022-02-22 LAB — COMPREHENSIVE METABOLIC PANEL
ALT: 6 IU/L (ref 0–32)
AST: 16 IU/L (ref 0–40)
Albumin/Globulin Ratio: 1.2 (ref 1.2–2.2)
Albumin: 4.1 g/dL (ref 3.9–4.9)
Alkaline Phosphatase: 80 IU/L (ref 44–121)
BUN/Creatinine Ratio: 9 (ref 9–23)
BUN: 6 mg/dL (ref 6–24)
Bilirubin Total: 0.3 mg/dL (ref 0.0–1.2)
CO2: 21 mmol/L (ref 20–29)
Calcium: 9.4 mg/dL (ref 8.7–10.2)
Chloride: 105 mmol/L (ref 96–106)
Creatinine, Ser: 0.65 mg/dL (ref 0.57–1.00)
Globulin, Total: 3.3 g/dL (ref 1.5–4.5)
Glucose: 102 mg/dL — ABNORMAL HIGH (ref 70–99)
Potassium: 3.9 mmol/L (ref 3.5–5.2)
Sodium: 138 mmol/L (ref 134–144)
Total Protein: 7.4 g/dL (ref 6.0–8.5)
eGFR: 113 mL/min/{1.73_m2} (ref >59)

## 2022-02-22 LAB — TSH: TSH: 1.85 u[IU]/mL (ref 0.450–4.500)

## 2022-02-22 LAB — CBC WITH DIFF/PLATELET
Basophils Absolute: 0 10*3/uL (ref 0.0–0.2)
Basos: 1 %
EOS (ABSOLUTE): 0.2 10*3/uL (ref 0.0–0.4)
Eos: 5 %
Hematocrit: 37.3 % (ref 34.0–46.6)
Hemoglobin: 12.7 g/dL (ref 11.1–15.9)
Immature Grans (Abs): 0 10*3/uL (ref 0.0–0.1)
Immature Granulocytes: 0 %
Lymphocytes Absolute: 1.6 10*3/uL (ref 0.7–3.1)
Lymphs: 37 %
MCH: 29.5 pg (ref 26.6–33.0)
MCHC: 34 g/dL (ref 31.5–35.7)
MCV: 87 fL (ref 79–97)
Monocytes Absolute: 0.3 10*3/uL (ref 0.1–0.9)
Monocytes: 7 %
Neutrophils Absolute: 2.1 10*3/uL (ref 1.4–7.0)
Neutrophils: 50 %
Platelets: 301 10*3/uL (ref 150–450)
RBC: 4.31 x10E6/uL (ref 3.77–5.28)
RDW: 11.3 % — ABNORMAL LOW (ref 11.7–15.4)
WBC: 4.2 10*3/uL (ref 3.4–10.8)

## 2022-02-22 LAB — T4, FREE: Free T4: 1.06 ng/dL (ref 0.82–1.77)

## 2022-02-22 LAB — B12 AND FOLATE PANEL
Folate: >20 ng/mL (ref >3.0)
Vitamin B-12: >2000 pg/mL — ABNORMAL HIGH (ref 232–1245)

## 2022-02-22 LAB — HEMOGLOBIN A1C
Est. average glucose Bld gHb Est-mCnc: 123 mg/dL
Hgb A1c MFr Bld: 5.9 % — ABNORMAL HIGH (ref 4.8–5.6)

## 2022-02-22 LAB — IRON,TIBC AND FERRITIN PANEL
Ferritin: 35 ng/mL (ref 15–150)
Iron Saturation: 27 % (ref 15–55)
Iron: 100 ug/dL (ref 27–159)
Total Iron Binding Capacity: 366 ug/dL (ref 250–450)
UIBC: 266 ug/dL (ref 131–425)

## 2022-02-22 LAB — VITAMIN D 25 HYDROXY (VIT D DEFICIENCY, FRACTURES): Vit D, 25-Hydroxy: 25.5 ng/mL — ABNORMAL LOW (ref 30.0–100.0)

## 2022-02-27 MED ORDER — VITAMIN D (ERGOCALCIFEROL) 1.25 MG (50000 UNIT) PO CAPS: 50000.0000 [IU] | ORAL_CAPSULE | ORAL | 3 refills | Status: DC

## 2022-02-27 NOTE — Addendum Note (Signed)
Addended by: Baelyn Doring, Clarise Cruz E on: 02/27/2022 07:57 AM   Modules accepted: Orders

## 2022-03-31 ENCOUNTER — Ambulatory Visit (INDEPENDENT_AMBULATORY_CARE_PROVIDER_SITE_OTHER): Payer: Medicare Other | Admitting: Nurse Practitioner

## 2022-03-31 VITALS — BP 114/72 | HR 72 | Ht 64.0 in | Wt 188.6 lb

## 2022-03-31 DIAGNOSIS — K219 Gastro-esophageal reflux disease without esophagitis: Secondary | ICD-10-CM | POA: Diagnosis not present

## 2022-03-31 DIAGNOSIS — N760 Acute vaginitis: Secondary | ICD-10-CM | POA: Insufficient documentation

## 2022-03-31 DIAGNOSIS — Z23 Encounter for immunization: Secondary | ICD-10-CM

## 2022-03-31 HISTORY — DX: Acute vaginitis: N76.0

## 2022-03-31 LAB — POCT URINALYSIS DIP (CLINITEK)
Bilirubin, UA: NEGATIVE
Blood, UA: NEGATIVE
Glucose, UA: NEGATIVE mg/dL
Ketones, POC UA: NEGATIVE mg/dL
Leukocytes, UA: NEGATIVE
Nitrite, UA: NEGATIVE
POC PROTEIN,UA: NEGATIVE
Spec Grav, UA: 1.01 (ref 1.010–1.025)
Urobilinogen, UA: 0.2 E.U./dL
pH, UA: 7.5 (ref 5.0–8.0)

## 2022-03-31 MED ORDER — FLUCONAZOLE 150 MG PO TABS: ORAL_TABLET | ORAL | 2 refills | Status: DC

## 2022-03-31 MED ORDER — OMEPRAZOLE 40 MG PO CPDR: 40.0000 mg | DELAYED_RELEASE_CAPSULE | Freq: Every day | ORAL | 3 refills | Status: DC

## 2022-03-31 NOTE — Patient Instructions (Addendum)
I have sent medication in for a yeast infection and for reflux. If this does not get better, please let me know.

## 2022-03-31 NOTE — Assessment & Plan Note (Signed)
Symptoms and presentation consistent with GERD. She has had this in the past. We will go ahead and start omeprazole '40mg'$  once a day to see if we can get control of her symptoms. If symptoms persist after 1 week, I will plan to increase the dose to '40mg'$  BID. Patient instructed to continue medication for 30 days then she may continue as needed. If symptoms return, she will continue daily.

## 2022-03-31 NOTE — Assessment & Plan Note (Signed)
Symptoms and presentation consistent with vaginal yeast infection. She does not have RF for STI at this time. UA negative in the office today. Will send in tx with diflucan for 2 doses 72 hours apart and monitor. Patient aware if sx persist after both doses to contact me.

## 2022-03-31 NOTE — Progress Notes (Signed)
Orma Render, DNP, AGNP-c [facility] [address] [place], [place] [address] [phone]  Subjective:   [name] is a 41 y.o. female presents to day for evaluation of: Vaginal Itching About 5 days ago she started with vaginal itching and burning and thick white vaginal discharge. Her urine was cloudy,but she does feel this is clearing up.  She has not had any new sexual partners.  She has not had any pelvic pain or fevers. No urinary urgency. She has had some back pain.  Burning Stomach About 2 weeks burning abdominal pain after eating. She has been taking tums and alkaseltzer, which helps for a short period. She used to be on omeprazole for the same symptoms, but she has not been on this for a while.  PMH, Medications, and Allergies reviewed and updated in chart as appropriate.   ROS negative except for what is listed in HPI. Objective:  BP 114/72 (BP Location: Right Arm, Patient Position: Sitting)   Pulse 72   Ht '5\' 4"'$  (1.626 m)   Wt 188 lb 9.6 oz (85.5 kg)   BMI 32.37 kg/m  Physical Exam Vitals and nursing note reviewed.  Constitutional:      General: She is not in acute distress.    Appearance: Normal appearance.  Eyes:     Extraocular Movements: Extraocular movements intact.     Conjunctiva/sclera: Conjunctivae normal.     Pupils: Pupils are equal, round, and reactive to light.  Neck:     Vascular: No carotid bruit.  Cardiovascular:     Rate and Rhythm: Normal rate and regular rhythm.     Pulses: Normal pulses.     Heart sounds: Normal heart sounds. No murmur heard. Pulmonary:     Effort: Pulmonary effort is normal.     Breath sounds: Normal breath sounds. No wheezing.  Abdominal:     General: Bowel sounds are normal. There is no distension.     Palpations: Abdomen is soft.     Tenderness: There is abdominal tenderness in the epigastric area. There is no guarding.  Skin:    General: Skin is warm and dry.     Capillary Refill:  Capillary refill takes less than 2 seconds.  Neurological:     General: No focal deficit present.     Mental Status: She is alert and oriented to person, place, and time.  Psychiatric:        Mood and Affect: Mood normal.        Behavior: Behavior normal.        Thought Content: Thought content normal.        Judgment: Judgment normal.           Assessment & Plan:   Problem List Items Addressed This Visit     Gastroesophageal reflux disease - Primary    Symptoms and presentation consistent with GERD. She has had this in the past. We will go ahead and start omeprazole '40mg'$  once a day to see if we can get control of her symptoms. If symptoms persist after 1 week, I will plan to increase the dose to '40mg'$  BID. Patient instructed to continue medication for 30 days then she may continue as needed. If symptoms return, she will continue daily.       Relevant Medications   omeprazole (PRILOSEC) 40 MG capsule   Acute vaginitis    Symptoms and presentation consistent with vaginal yeast infection. She does not have RF for STI at this time. UA negative  in the office today. Will send in tx with diflucan for 2 doses 72 hours apart and monitor. Patient aware if sx persist after both doses to contact me.       Relevant Medications   fluconazole (DIFLUCAN) 150 MG tablet   Other Visit Diagnoses     Need for COVID-19 vaccine       Relevant Orders   [employer] Fall 2023 Covid-19 Vaccine 58yr and older         SOrma Render DNP, AGNP-c 03/31/2022  10:42 AM    History, Medications, Surgery, SDOH, and Family History reviewed and updated as appropriate.

## 2022-04-18 ENCOUNTER — Encounter: Payer: Self-pay | Admitting: Nurse Practitioner

## 2022-07-12 ENCOUNTER — Encounter (HOSPITAL_COMMUNITY): Payer: Self-pay

## 2022-07-12 ENCOUNTER — Emergency Department (HOSPITAL_COMMUNITY)
Admission: EM | Admit: 2022-07-12 | Discharge: 2022-07-12 | Disposition: A | Discharge status: Home / Self Care | Payer: Medicare Other | Attending: Emergency Medicine | Admitting: Emergency Medicine

## 2022-07-12 ENCOUNTER — Other Ambulatory Visit: Payer: Self-pay

## 2022-07-12 ENCOUNTER — Ambulatory Visit (HOSPITAL_COMMUNITY)
Admission: EM | Admit: 2022-07-12 | Discharge: 2022-07-12 | Disposition: A | Discharge status: Home / Self Care | Payer: Medicare Other | Attending: Emergency Medicine | Admitting: Emergency Medicine

## 2022-07-12 ENCOUNTER — Ambulatory Visit (INDEPENDENT_AMBULATORY_CARE_PROVIDER_SITE_OTHER): Payer: Medicare Other

## 2022-07-12 DIAGNOSIS — Z7951 Long term (current) use of inhaled steroids: Secondary | ICD-10-CM | POA: Insufficient documentation

## 2022-07-12 DIAGNOSIS — R0602 Shortness of breath: Secondary | ICD-10-CM | POA: Insufficient documentation

## 2022-07-12 DIAGNOSIS — J45909 Unspecified asthma, uncomplicated: Secondary | ICD-10-CM | POA: Insufficient documentation

## 2022-07-12 DIAGNOSIS — R001 Bradycardia, unspecified: Secondary | ICD-10-CM | POA: Diagnosis not present

## 2022-07-12 DIAGNOSIS — R079 Chest pain, unspecified: Secondary | ICD-10-CM | POA: Insufficient documentation

## 2022-07-12 DIAGNOSIS — Z1152 Encounter for screening for COVID-19: Secondary | ICD-10-CM | POA: Diagnosis not present

## 2022-07-12 LAB — CBC WITH DIFFERENTIAL/PLATELET
Abs Immature Granulocytes: 0.01 10*3/uL (ref 0.00–0.07)
Basophils Absolute: 0 10*3/uL (ref 0.0–0.1)
Basophils Relative: 1 %
Eosinophils Absolute: 0.2 10*3/uL (ref 0.0–0.5)
Eosinophils Relative: 3 %
HCT: 36.8 % (ref 36.0–46.0)
Hemoglobin: 11.7 g/dL — ABNORMAL LOW (ref 12.0–15.0)
Immature Granulocytes: 0 %
Lymphocytes Relative: 37 %
Lymphs Abs: 2 10*3/uL (ref 0.7–4.0)
MCH: 29.5 pg (ref 26.0–34.0)
MCHC: 31.8 g/dL (ref 30.0–36.0)
MCV: 92.7 fL (ref 80.0–100.0)
Monocytes Absolute: 0.3 10*3/uL (ref 0.1–1.0)
Monocytes Relative: 5 %
Neutro Abs: 2.9 10*3/uL (ref 1.7–7.7)
Neutrophils Relative %: 54 %
Platelets: 271 10*3/uL (ref 150–400)
RBC: 3.97 MIL/uL (ref 3.87–5.11)
RDW: 12.8 % (ref 11.5–15.5)
WBC: 5.3 10*3/uL (ref 4.0–10.5)
nRBC: 0 % (ref 0.0–0.2)

## 2022-07-12 LAB — BASIC METABOLIC PANEL
Anion gap: 10 (ref 5–15)
BUN: <5 mg/dL — ABNORMAL LOW (ref 6–20)
CO2: 21 mmol/L — ABNORMAL LOW (ref 22–32)
Calcium: 8.5 mg/dL — ABNORMAL LOW (ref 8.9–10.3)
Chloride: 106 mmol/L (ref 98–111)
Creatinine, Ser: 0.7 mg/dL (ref 0.44–1.00)
GFR, Estimated: >60 mL/min (ref >60)
Glucose, Bld: 89 mg/dL (ref 70–99)
Potassium: 3.7 mmol/L (ref 3.5–5.1)
Sodium: 137 mmol/L (ref 135–145)

## 2022-07-12 LAB — RESP PANEL BY RT-PCR (RSV, FLU A&B, COVID)  RVPGX2
Influenza A by PCR: NEGATIVE
Influenza B by PCR: NEGATIVE
Resp Syncytial Virus by PCR: NEGATIVE
SARS Coronavirus 2 by RT PCR: NEGATIVE

## 2022-07-12 LAB — I-STAT BETA HCG BLOOD, ED (MC, WL, AP ONLY): I-stat hCG, quantitative: <5 m[IU]/mL (ref <5)

## 2022-07-12 LAB — D-DIMER, QUANTITATIVE: D-Dimer, Quant: 0.31 ug/mL-FEU (ref 0.00–0.50)

## 2022-07-12 LAB — TROPONIN I (HIGH SENSITIVITY): Troponin I (High Sensitivity): 3 ng/L (ref <18)

## 2022-07-12 NOTE — Discharge Instructions (Signed)
You are seen in the emergency department for shortness of breath.  Workup here has been reassuring.  You may take ibuprofen to help with any chest wall inflammation.  Please return to emergency department for worsening symptoms.

## 2022-07-12 NOTE — ED Notes (Signed)
Patient is being discharged from the Urgent Care and sent to the Emergency Department via personal vehicle. Per Willeen Cass NP, patient is in need of higher level of care due to SOB and chest pain. Patient is aware and verbalizes understanding of plan of care.  Vitals:   07/12/22 0836  BP: 119/86  Pulse: 68  Resp: 16  Temp: 98.2 F (36.8 C)  SpO2: 93%

## 2022-07-12 NOTE — Discharge Instructions (Signed)
Please go to the ED to get your shortness of breath checked.

## 2022-07-12 NOTE — ED Provider Notes (Signed)
[facility] Provider Note   CSN: [id number] Arrival date & time: 07/12/22  1000     History  Chief Complaint  Patient presents with   Shortness of [name] is a 42 y.o. female.  With past medical history of MDD, asthma, GERD, SVT who presents to the emergency department with shortness of breath.  Patient states that she has had 4 days of worsening dyspnea.  She describes having shortness of breath with minimal exertion, chest pain with deep breaths and central chest tightness.  She also states that symptoms are worse when she is laying down and improves with sitting up.  She denies having cough or fever, palpitations, recent long flights or drives, surgery, malignancy, estrogen use, tobacco use, history of blood clots.  She was evaluated at urgent care this morning for the same symptoms.  She had chest x-ray which did not demonstrate any acute findings.  She was sent here for rule out PE.   Shortness of Breath Associated symptoms: chest pain        Home Medications Prior to Admission medications   Medication Sig Start Date End Date Taking? Authorizing Provider  albuterol (VENTOLIN HFA) 108 (90 Base) MCG/ACT inhaler Inhale 1-2 puffs into the lungs every 6 (six) hours as needed for wheezing or shortness of breath. 11/03/21   Orma Render, NP  busPIRone (BUSPAR) 5 MG tablet Take 5 mg by mouth 2 (two) times daily. 10/11/21   [provider]  citalopram (CELEXA) 40 MG tablet Take 1 tablet (40 mg total) by mouth daily. 12/29/21   Orma Render, NP  cyclobenzaprine (FLEXERIL) 10 MG tablet Take 10 mg by mouth 3 (three) times daily as needed for muscle spasms. PRN    [provider]  fluconazole (DIFLUCAN) 150 MG tablet Take one tablet by mouth at the first sign of symptoms of yeast. If no resolution, repeat dose in 72 hours. 03/31/22   Orma Render, NP  fluticasone (FLONASE) 50 MCG/ACT nasal spray Place 1 spray  into both nostrils daily. 01/06/22   [provider]  ibuprofen (ADVIL) 800 MG tablet Take 1 tablet (800 mg total) by mouth 2 (two) times daily as needed. 11/03/21   Orma Render, NP  omeprazole (PRILOSEC) 40 MG capsule Take 1 capsule (40 mg total) by mouth daily. 03/31/22   Orma Render, NP  Vitamin D, Ergocalciferol, (DRISDOL) 1.25 MG (50000 UNIT) CAPS capsule Take 1 capsule (50,000 Units total) by mouth every 7 (seven) days. 02/27/22   Orma Render, NP  fexofenadine (ALLEGRA) 180 MG tablet Take 180 mg by mouth daily.  09/05/19  [provider]      Allergies    Amoxicillin, Imitrex [sumatriptan base], Penicillins, Tramadol, Norco [hydrocodone-acetaminophen], Percocet [oxycodone-acetaminophen], and Soy allergy    Review of Systems   Review of Systems  Constitutional:  Positive for fatigue.  Respiratory:  Positive for shortness of breath.   Cardiovascular:  Positive for chest pain.  All other systems reviewed and are negative.   Physical Exam Updated Vital Signs BP 112/76   Pulse (!) 59   Temp 98.5 F (36.9 C) (Oral)   Resp 17   Ht 5\' 5"  (1.651 m)   Wt 85 kg   SpO2 100%   BMI 31.18 kg/m  Physical Exam Vitals and nursing note reviewed.  Constitutional:      General: She is not in acute distress.    Appearance:  Normal appearance. She is well-developed. She is not ill-appearing or toxic-appearing.  HENT:     Head: Normocephalic.  Eyes:     General: No scleral icterus. Cardiovascular:     Rate and Rhythm: Regular rhythm. Bradycardia present.     Pulses: Normal pulses.     Heart sounds: No murmur heard. Pulmonary:     Effort: Pulmonary effort is normal. No tachypnea or respiratory distress.     Breath sounds: Normal breath sounds.  Chest:     Chest wall: No tenderness.  Abdominal:     General: Bowel sounds are normal.     Palpations: Abdomen is soft.  Musculoskeletal:     Cervical back: Normal range of motion.     Right lower leg: No edema.     Left  lower leg: No edema.  Skin:    General: Skin is warm and dry.     Capillary Refill: Capillary refill takes less than 2 seconds.  Neurological:     General: No focal deficit present.     Mental Status: She is alert and oriented to person, place, and time.  Psychiatric:        Mood and Affect: Mood normal.        Behavior: Behavior normal.     ED Results / Procedures / Treatments   Labs (all labs ordered are listed, but only abnormal results are displayed) Labs Reviewed  BASIC METABOLIC PANEL - Abnormal; Notable for the following components:      Result Value   CO2 21 (*)    BUN <5 (*)    Calcium 8.5 (*)    All other components within normal limits  CBC WITH DIFFERENTIAL/PLATELET - Abnormal; Notable for the following components:   Hemoglobin 11.7 (*)    All other components within normal limits  RESP PANEL BY RT-PCR (RSV, FLU A&B, COVID)  RVPGX2  D-DIMER, QUANTITATIVE  I-STAT BETA HCG BLOOD, ED ([facility], [facility], [facility] ONLY)  TROPONIN I (HIGH SENSITIVITY)  TROPONIN I (HIGH SENSITIVITY)    EKG None  Radiology DG Chest 2 View  Result Date: 07/12/2022 CLINICAL DATA:  Shortness of breath and chest pain. EXAM: CHEST - 2 VIEW COMPARISON:  Chest radiograph 07/16/2020 FINDINGS: The cardiomediastinal silhouette is normal There is no focal consolidation or pulmonary edema. There is no pleural effusion or pneumothorax There is no acute osseous abnormality. IMPRESSION: No radiographic evidence of acute cardiopulmonary process. Electronically Signed   By: Valetta Mole M.D.   On: 07/12/2022 09:30    Procedures Procedures   Medications Ordered in ED Medications - No data to display  ED Course/ Medical Decision Making/ A&P            HEART Score: 0                Medical Decision Making Amount and/or Complexity of Data Reviewed Labs: ordered. Radiology: ordered.  Initial Impression and Ddx 42 year old female who presents to the emergency department with shortness of breath Patient PMH  that increases complexity of ED encounter: SVT, asthma Differential: ACS, PE, pneumothorax, pleural effusion, cardiac tamponade, aortic dissection, COPD exacerbation, pneumonia, asthma exacerbation, congestive heart failure, viral upper respiratory infection, medication side effect, anaphylaxis, etc.    Interpretation of Diagnostics I independent reviewed and interpreted the labs as followed: CBC without leukocytosis, anemia, BMP without AKI, or electrolyte dysfunction, respiratory panel negative, D-dimer negative, troponin negative  - I independently visualized the following imaging with scope of interpretation limited to determining acute life threatening conditions  related to emergency care: Chest x-ray from urgent care, which revealed no acute findings  Patient Reassessment and Ultimate Disposition/Management 42 year old female who presents to the emergency department with chest pain and shortness of breath.  She is overall well-appearing, nonseptic and nontoxic appearing.  She is hemodynamically stable and afebrile.  EKG without ischemia or infarction, troponin x1 negative, doubt ACS.  Low heart score Does not appear fluid volume overloaded on exam, no edema on CXR, doubt CHF exacerbation  Considered PE given her pleuritic symptoms, dyspnea on exertion.  Obtained a D-dimer which was negative.  Will not pursue CTA PE study at this time. CXR without evidence of pneumonia, pleural effusion or pneumothorax. No recent illnesses and troponin negative so doubt pericarditis or myocarditis  Symptoms inconsistent with aortic dissection  HEART Score: 0 Respiratory panel negative  Unclear etiology of her dyspnea.  May be MSK related like costochondritis versus pleurisy.  Do not feel that she needs further workup at this time.  Instructed her to use NSAIDs for a few days should this be MSK related.  Instructed return for worsening chest pain or shortness of breath.  She verbalized understanding.  Safe  for discharge at this time.  The patient has been appropriately medically screened and/or stabilized in the ED. I have low suspicion for any other emergent medical condition which would require further screening, evaluation or treatment in the ED or require inpatient management. At time of discharge the patient is hemodynamically stable and in no acute distress. I have discussed work-up results and diagnosis with patient and answered all questions. Patient is agreeable with discharge plan. We discussed strict return precautions for returning to the emergency department and they verbalized understanding.     Patient management required discussion with the following services or consulting groups:  None  Complexity of Problems Addressed Acute complicated illness or Injury  Additional Data Reviewed and Analyzed Further history obtained from: Past medical history and medications listed in the EMR, Prior ED visit notes, Recent discharge summary, Care Everywhere, and Prior labs/imaging results  Patient Encounter Risk Assessment None  Final Clinical Impression(s) / ED Diagnoses Final diagnoses:  Shortness of breath    Rx / DC Orders ED Discharge Orders     None         Mickie Hillier, PA-C 07/12/22 1419    Carmin Muskrat, MD 07/12/22 1524

## 2022-07-12 NOTE — ED Provider Notes (Addendum)
[facility]    CSN: [id number] Arrival date & time: 07/12/22  0803      History   Chief Complaint Chief Complaint  Patient presents with   Shortness of Breath   Chest Pain    HPI [name] is a 42 y.o. female.    Pt reports progressively worsening SOB for the last 4 days. Has been increasing exercise lately and [place] on a treadmill 83min 3x/week. As of yesterday, she was too short of breath to do this. She reports getting SOB walking short distances and was SOB walking from waiting room to exam room in urgent care. SOB is worse when she lies flat. She does have some middle chest pain with movement and palpation. Denies peripheral edema, denies URI or acute illness symptoms including fever, chills, congestion, cough.   Pt denies recent travel or prolonged sitting.    Shortness of Breath Associated symptoms: chest pain   Chest Pain Associated symptoms: shortness of breath     Past Medical History:  Diagnosis Date   Anemia    h/o   Anginal pain ([place])    pt. takes nitrostat- as needed, hasn't had since 04/2012, sees Dr. Terrence Dupont, last ekg was /w PCP- [facility]   Anxiety    Asthma    Borderline personality disorder ([place])    Chiari malformation    Depression    E coli bacteremia 01/22/2012   GERD (gastroesophageal reflux disease)    Headache(784.0)    Headache(784.0) 01/19/2012   Major depressive disorder, recurrent episode, mild ([place]) 04/20/2018   Meningitis    Oct. 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   Vision changes 12/11/2021   Generalized anxiety  disorder 12/11/2021   Pre-diabetes 12/11/2021   Mild intermittent asthma without complication [id number]   Intractable episodic headache 12/11/2021   Depression, recurrent ([place]) 12/11/2021   Vitamin D deficiency 12/11/2021   Major depressive disorder, 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   Arnold-Chiari malformation, 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    Past Surgical History:  Procedure Laterality Date   ABDOMINAL HYSTERECTOMY     PERIPHERALLY INSERTED CENTRAL CATHETER INSERTION     during event of Meningitis- then d/c'd to home /w & treated /w antibiotics    SUBOCCIPITAL CRANIECTOMY CERVICAL LAMINECTOMY N/A 07/22/2012   Procedure: SUBOCCIPITAL CRANIECTOMY CERVICAL LAMINECTOMY/DURAPLASTY;  Surgeon: Hosie Spangle, MD;  Location: [facility];  Service: Neurosurgery;  Laterality: N/A;  Suboccipital craniectomy with upper cervial laminectomy with duroplasty    TUBAL LIGATION     VAGINAL DELIVERY     x3   WISDOM TOOTH EXTRACTION      OB History   No obstetric history on file.      Home Medications    Prior to Admission medications   Medication Sig Start Date End Date Taking? Authorizing Provider  albuterol (VENTOLIN HFA) 108 (90 Base) MCG/ACT inhaler Inhale 1-2 puffs into the lungs every 6 (six) hours as needed for wheezing or shortness of breath. 11/03/21  Yes Early, Coralee Pesa, NP  busPIRone (BUSPAR) 5 MG tablet Take 5 mg by mouth 2 (two) times daily. 10/11/21  Yes [provider]  citalopram (CELEXA) 40 MG tablet Take 1 tablet (40 mg total) by mouth daily. 12/29/21  Yes Early, Coralee Pesa, NP  fluticasone (FLONASE) 50 MCG/ACT nasal spray Place 1 spray into both nostrils daily. 01/06/22  Yes [provider]  ibuprofen (ADVIL) 800 MG tablet Take 1 tablet (800 mg total) by mouth 2 (two) times daily as needed. 11/03/21  Yes Early, Coralee Pesa, NP  omeprazole (PRILOSEC) 40 MG capsule Take 1 capsule (40 mg total) by mouth daily. 03/31/22  Yes Early, Coralee Pesa, NP   cyclobenzaprine (FLEXERIL) 10 MG tablet Take 10 mg by mouth 3 (three) times daily as needed for muscle spasms. PRN    [provider]  fluconazole (DIFLUCAN) 150 MG tablet Take one tablet by mouth at the first sign of symptoms of yeast. If no resolution, repeat dose in 72 hours. 03/31/22   Orma Render, NP  Vitamin D, Ergocalciferol, (DRISDOL) 1.25 MG (50000 UNIT) CAPS capsule Take 1 capsule (50,000 Units total) by mouth every 7 (seven) days. 02/27/22   Orma Render, NP  fexofenadine (ALLEGRA) 180 MG tablet Take 180 mg by mouth daily.  09/05/19  [provider]    Family History Family History  Problem Relation Age of Onset   Heart attack Mother    Healthy Father     Social History Social History   Tobacco Use   Smoking status: Never    Passive exposure: Never   Smokeless tobacco: Never  Vaping Use   Vaping Use: Never used  Substance Use Topics   Alcohol use: No   Drug use: No     Allergies   Amoxicillin, Imitrex [sumatriptan base], Penicillins, Tramadol, Norco [hydrocodone-acetaminophen], Percocet [oxycodone-acetaminophen], and Soy allergy   Review of Systems Review of Systems  Respiratory:  Positive for shortness of breath.   Cardiovascular:  Positive for chest pain.     Physical Exam Triage Vital Signs ED Triage Vitals  Enc Vitals Group     BP 07/12/22 0836 119/86     Pulse Rate 07/12/22 0836 68     Resp 07/12/22 0836 16     Temp 07/12/22 0836 98.2 F (36.8 C)     Temp Source 07/12/22 0836 Oral     SpO2 07/12/22 0836 93 %     Weight 07/12/22 0836 188 lb (85.3 kg)     Height 07/12/22 0836 5\' 5"  (1.651 m)     Head Circumference --      Peak Flow --      Pain Score 07/12/22 0835 6     Pain Loc --      Pain Edu? --      Excl. in [place]? --    No data found.  Updated Vital Signs BP 119/86 (BP Location: Left Arm)   Pulse 68   Temp 98.2 F (36.8 C) (Oral)   Resp 16   Ht 5\' 5"  (1.651 m)   Wt 188 lb (85.3 kg)   SpO2 93%   BMI 31.28 kg/m    Visual Acuity Right Eye Distance:   Left Eye Distance:   Bilateral Distance:    Right Eye Near:   Left Eye Near:    Bilateral Near:     Physical Exam Constitutional:      General: She is not in acute distress.    Appearance: She is well-developed. She is not ill-appearing.  Cardiovascular:     Rate and Rhythm: Normal rate and regular rhythm.  Pulmonary:  Effort: Pulmonary effort is normal.     Breath sounds: Normal breath sounds.  Chest:     Chest wall: Tenderness present.    Musculoskeletal:     Right lower leg: No edema.     Left lower leg: No edema.     Comments: Negative homan's sign BLE  Neurological:     Mental Status: She is alert.      UC Treatments / Results  Labs (all labs ordered are listed, but only abnormal results are displayed) Labs Reviewed - No data to display  EKG   Radiology DG Chest 2 View  Result Date: 07/12/2022 CLINICAL DATA:  Shortness of breath and chest pain. EXAM: CHEST - 2 VIEW COMPARISON:  Chest radiograph 07/16/2020 FINDINGS: The cardiomediastinal silhouette is normal There is no focal consolidation or pulmonary edema. There is no pleural effusion or pneumothorax There is no acute osseous abnormality. IMPRESSION: No radiographic evidence of acute cardiopulmonary process. Electronically Signed   By: Valetta Mole M.D.   On: 07/12/2022 09:30    Procedures Procedures (including critical care time)  Medications Ordered in UC Medications - No data to display  Initial Impression / Assessment and Plan / UC Course  I have reviewed the triage vital signs and the nursing notes.  Pertinent labs & imaging results that were available during my care of the patient were reviewed by me and considered in my medical decision making (see chart for details).    EKG: normal sinus rhythm, poor R wave progression, low voltage QRS  CXR and EKG unremarkable. Pt's symptoms could be related to PE and I am not able to test for that here in UC.  Discussed concern with pt, advised ED evaluation to r/o PE.    Final Clinical Impressions(s) / UC Diagnoses   Final diagnoses:  Shortness of breath     Discharge Instructions      Please go to the ED to get your shortness of breath checked.    ED Prescriptions   None    PDMP not reviewed this encounter.   Carvel Getting, NP 07/12/22 0950    Carvel Getting, NP 07/12/22 [phone]

## 2022-07-12 NOTE — ED Notes (Signed)
Got patient into a gown on the monitor did EKG shown er provider patient is resting with call bell in reach

## 2022-07-12 NOTE — ED Triage Notes (Signed)
Pt to the ed from home via pov with a CC of Sob with chest pain. Pt relays painful to take a deep breath and has increased sob with ambulation. Pt relays lethargy, denies dizziness.

## 2022-07-12 NOTE — ED Triage Notes (Signed)
SOB and chest pain for 4 days. Worse with moving and laying down. No known sick exposure. Patient has history of angina and asthma.   Took ibuprofen with no relief.

## 2022-07-25 ENCOUNTER — Ambulatory Visit (HOSPITAL_COMMUNITY)
Admission: EM | Admit: 2022-07-25 | Discharge: 2022-07-25 | Disposition: A | Discharge status: Home / Self Care | Payer: Medicare Other | Attending: Emergency Medicine | Admitting: Emergency Medicine

## 2022-07-25 ENCOUNTER — Encounter (HOSPITAL_COMMUNITY): Payer: Self-pay | Admitting: Emergency Medicine

## 2022-07-25 DIAGNOSIS — N3001 Acute cystitis with hematuria: Secondary | ICD-10-CM | POA: Diagnosis present

## 2022-07-25 DIAGNOSIS — N898 Other specified noninflammatory disorders of vagina: Secondary | ICD-10-CM

## 2022-07-25 LAB — POCT URINALYSIS DIPSTICK, ED / UC
Bilirubin Urine: NEGATIVE
Glucose, UA: NEGATIVE mg/dL
Ketones, ur: NEGATIVE mg/dL
Nitrite: NEGATIVE
Protein, ur: NEGATIVE mg/dL
Specific Gravity, Urine: <=1.005 (ref 1.005–1.030)
Urobilinogen, UA: 0.2 mg/dL (ref 0.0–1.0)
pH: 7 (ref 5.0–8.0)

## 2022-07-25 MED ORDER — SULFAMETHOXAZOLE-TRIMETHOPRIM 800-160 MG PO TABS: 1.0000 | ORAL_TABLET | Freq: Two times a day (BID) | ORAL | 0 refills | Status: AC

## 2022-07-25 NOTE — Discharge Instructions (Addendum)
Please take antibiotic as prescribed. Take with food to avoid upset stomach.  We will call you if urine culture results and requires change in treatment plan. We will will also call with any positive results on the vaginal swab.  Keep drinking lots of fluids!

## 2022-07-25 NOTE — ED Provider Notes (Signed)
[facility]    CSN: [id number] Arrival date & time: 07/25/22  1229     History   Chief Complaint Chief Complaint  Patient presents with   Urinary Frequency    HPI [name] is a 42 y.o. female.  Here with 3-4 days of dysuria and frequency History of UTI and this feels similar. Has been drinking lots of fluids She denies fever, flank pain, abdominal pain, NVD  Does endorse some white vaginal discharge S/p hysterectomy   Past Medical History:  Diagnosis Date   Anemia    h/o   Anginal pain    pt. takes nitrostat- as needed, hasn't had since 04/2012, sees Dr. Terrence Dupont, last ekg was /w PCP- [facility]   Anxiety    Asthma    Borderline personality disorder    Chiari malformation    Depression    E coli bacteremia 01/22/2012   GERD (gastroesophageal reflux disease)    Headache(784.0)    Headache(784.0) 01/19/2012   Major depressive disorder, recurrent episode, mild 04/20/2018   Meningitis    Oct. 2013   Neuromuscular disorder    muscle spasms-back & arms    SVT (supraventricular tachycardia) 01/22/2012   UTI (lower urinary tract infection) 01/20/2012    Patient Active Problem List   Diagnosis Date Noted   Gastroesophageal reflux disease 03/31/2022   Acute vaginitis 03/31/2022   Other fatigue 02/21/2022   Attention deficit hyperactivity disorder (ADHD), 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   Vision changes 12/11/2021   Generalized anxiety disorder 12/11/2021   Pre-diabetes 12/11/2021   Mild intermittent asthma without complication [id number]   Intractable episodic headache 12/11/2021   Depression, recurrent 12/11/2021   Vitamin D deficiency 12/11/2021   Major depressive disorder, recurrent severe without psychotic features 07/22/2020   Arnold-Chiari malformation, 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    Past Surgical History:  Procedure Laterality Date   ABDOMINAL HYSTERECTOMY     PERIPHERALLY INSERTED CENTRAL CATHETER INSERTION     during event of Meningitis-  then d/c'd to home /w & treated /w antibiotics    SUBOCCIPITAL CRANIECTOMY CERVICAL LAMINECTOMY N/A 07/22/2012   Procedure: SUBOCCIPITAL CRANIECTOMY CERVICAL LAMINECTOMY/DURAPLASTY;  Surgeon: Hosie Spangle, MD;  Location: [facility];  Service: Neurosurgery;  Laterality: N/A;  Suboccipital craniectomy with upper cervial laminectomy with duroplasty    TUBAL LIGATION     VAGINAL DELIVERY     x3   WISDOM TOOTH EXTRACTION      OB History   No obstetric history on file.      Home Medications    Prior to Admission medications   Medication Sig Start Date End Date Taking? Authorizing Provider  sulfamethoxazole-trimethoprim (BACTRIM DS) 800-160 MG tablet Take 1 tablet by mouth 2 (two) times daily for 3 days. 07/25/22 07/28/22 Yes Paz Fuentes, Wells Guiles, PA-C  albuterol (VENTOLIN HFA) 108 (90 Base) MCG/ACT inhaler Inhale 1-2 puffs into the lungs every 6 (six) hours as needed for wheezing or shortness of breath. 11/03/21   Orma Render, NP  busPIRone (BUSPAR) 5 MG tablet Take 5 mg by mouth 2 (two) times daily. 10/11/21   [provider]  citalopram (CELEXA) 40 MG tablet Take 1 tablet (40 mg total) by mouth daily. 12/29/21   Orma Render, NP  cyclobenzaprine (FLEXERIL) 10 MG tablet Take 10 mg by mouth 3 (three) times daily as needed for muscle spasms. PRN    [provider]  fluticasone (FLONASE) 50 MCG/ACT nasal spray Place 1 spray into both nostrils daily. 01/06/22   [provider]  ibuprofen (ADVIL) 800 MG tablet Take 1 tablet (800 mg total) by mouth 2 (two) times daily as needed. 11/03/21   Orma Render, NP  omeprazole (PRILOSEC) 40 MG capsule Take 1 capsule (40 mg total) by mouth daily. 03/31/22   Orma Render, NP  Vitamin D, Ergocalciferol, (DRISDOL) 1.25 MG (50000 UNIT) CAPS capsule Take 1 capsule (50,000 Units total) by mouth every 7 (seven) days. 02/27/22   Orma Render, NP  fexofenadine (ALLEGRA) 180 MG tablet Take 180 mg by mouth daily.  09/05/19  [provider]     Family History Family History  Problem Relation Age of Onset   Heart attack Mother    Healthy Father     Social History Social History   Tobacco Use   Smoking status: Never    Passive exposure: Never   Smokeless tobacco: Never  Vaping Use   Vaping Use: Never used  Substance Use Topics   Alcohol use: No   Drug use: No     Allergies   Amoxicillin, Imitrex [sumatriptan base], Penicillins, Tramadol, Norco [hydrocodone-acetaminophen], Percocet [oxycodone-acetaminophen], and Soy allergy   Review of Systems Review of Systems As per HPI  Physical Exam Triage Vital Signs ED Triage Vitals  Enc Vitals Group     BP 07/25/22 1406 104/64     Pulse Rate 07/25/22 1406 75     Resp 07/25/22 1406 16     Temp 07/25/22 1406 98.4 F (36.9 C)     Temp Source 07/25/22 1406 Oral     SpO2 07/25/22 1406 100 %     Weight --      Height --      Head Circumference --      Peak Flow --      Pain Score 07/25/22 1405 4     Pain Loc --      Pain Edu? --      Excl. in [place]? --    No data found.  Updated Vital Signs BP 104/64 (BP Location: Right Arm)   Pulse 75   Temp 98.4 F (36.9 C) (Oral)   Resp 16   SpO2 100%   Physical Exam Vitals and nursing note reviewed.  Constitutional:      General: She is not in acute distress. HENT:     Mouth/Throat:     Mouth: Mucous membranes are moist.     Pharynx: Oropharynx is clear.  Eyes:     Conjunctiva/sclera: Conjunctivae normal.     Pupils: Pupils are equal, round, and reactive to light.  Cardiovascular:     Rate and Rhythm: Normal rate and regular rhythm.     Heart sounds: Normal heart sounds.  Pulmonary:     Effort: Pulmonary effort is normal.     Breath sounds: Normal breath sounds.  Abdominal:     General: Bowel sounds are normal.     Palpations: Abdomen is soft.     Tenderness: There is no abdominal tenderness. There is no right CVA tenderness, left CVA tenderness, guarding or rebound.  Neurological:     Mental Status: She  is alert and oriented to person, place, and time.     UC Treatments / Results  Labs (all labs ordered are listed, but only abnormal results are displayed) Labs Reviewed  POCT URINALYSIS DIPSTICK, ED / UC - Abnormal; Notable for the following components:      Result Value   Hgb urine dipstick SMALL (*)    Leukocytes,Ua SMALL (*)  All other components within normal limits  URINE CULTURE  CERVICOVAGINAL ANCILLARY ONLY    EKG   Radiology No results found.  Procedures Procedures (including critical care time)  Medications Ordered in UC Medications - No data to display  Initial Impression / Assessment and Plan / UC Course  I have reviewed the triage vital signs and the nursing notes.  Pertinent labs & imaging results that were available during my care of the patient were reviewed by me and considered in my medical decision making (see chart for details).  UA with small hemoglobin and leuks.  Culture is pending. With her symptoms and similarity to previous UTIs, we will go ahead and treat with Bactrim BID x 3 days Cytology swab is pending Return precautions discussed. Patient agrees to plan  Final Clinical Impressions(s) / UC Diagnoses   Final diagnoses:  Acute cystitis with hematuria  Vaginal discharge     Discharge Instructions      Please take antibiotic as prescribed. Take with food to avoid upset stomach.  We will call you if urine culture results and requires change in treatment plan. We will will also call with any positive results on the vaginal swab.  Keep drinking lots of fluids!     ED Prescriptions     Medication Sig Dispense Auth. Provider   sulfamethoxazole-trimethoprim (BACTRIM DS) 800-160 MG tablet Take 1 tablet by mouth 2 (two) times daily for 3 days. 6 tablet Davian Hanshaw, Wells Guiles, PA-C      PDMP not reviewed this encounter.   [place], [place] 07/25/22 1547

## 2022-07-25 NOTE — ED Triage Notes (Signed)
Pt reports urinary frequency and dysuria for a couple days. Noticed some vaginal discharge.

## 2022-07-26 ENCOUNTER — Telehealth (HOSPITAL_COMMUNITY): Payer: Self-pay

## 2022-07-26 LAB — CERVICOVAGINAL ANCILLARY ONLY
Bacterial Vaginitis (gardnerella): NEGATIVE
Candida Glabrata: NEGATIVE
Candida Vaginitis: POSITIVE — AB
Chlamydia: NEGATIVE
Comment: NEGATIVE
Comment: NEGATIVE
Comment: NEGATIVE
Comment: NEGATIVE
Comment: NEGATIVE
Comment: NORMAL
Neisseria Gonorrhea: NEGATIVE
Trichomonas: NEGATIVE

## 2022-07-26 NOTE — Telephone Encounter (Signed)
Patient seen yesterday. Came in and states that she went to the [facility] on [address] that informed her they did not receive her medication yesterday.  Medication states "E-Prescribing Status: Receipt confirmed by pharmacy (07/25/2022  2:56 PM EDT) "  Called and spoke with the pharmacy and was informed they are working on the medication. I was informed by the pharmacy that it should be ready in 20 minutes.   Patient informed and verbalized understanding.

## 2022-07-27 ENCOUNTER — Telehealth (HOSPITAL_COMMUNITY): Payer: Self-pay

## 2022-07-27 LAB — URINE CULTURE: Culture: >=100000 — AB

## 2022-07-27 MED ORDER — FLUCONAZOLE 150 MG PO TABS: 150.0000 mg | ORAL_TABLET | Freq: Every day | ORAL | 0 refills | Status: DC

## 2022-08-02 ENCOUNTER — Ambulatory Visit: Payer: Medicare Other | Admitting: Nurse Practitioner

## 2022-08-14 ENCOUNTER — Encounter: Payer: Self-pay | Admitting: Nurse Practitioner

## 2022-08-14 ENCOUNTER — Ambulatory Visit (INDEPENDENT_AMBULATORY_CARE_PROVIDER_SITE_OTHER): Payer: Medicare Other | Admitting: Family Medicine

## 2022-08-14 ENCOUNTER — Encounter: Payer: Self-pay | Admitting: Family Medicine

## 2022-08-14 VITALS — BP 122/72 | HR 84 | Ht 64.0 in | Wt 196.2 lb

## 2022-08-14 DIAGNOSIS — F332 Major depressive disorder, recurrent severe without psychotic features: Secondary | ICD-10-CM

## 2022-08-14 DIAGNOSIS — K1379 Other lesions of oral mucosa: Secondary | ICD-10-CM | POA: Diagnosis not present

## 2022-08-14 DIAGNOSIS — F319 Bipolar disorder, unspecified: Secondary | ICD-10-CM

## 2022-08-14 DIAGNOSIS — F3181 Bipolar II disorder: Secondary | ICD-10-CM

## 2022-08-14 NOTE — Patient Instructions (Addendum)
We are referring you to a psychiatrist, for medication changes. You want to go back up to 40 mg of the citalopram--you can double up on what you have (the  tablets) until you can pick up the 40 mg tablet from the pharmacy. Let us know if you don't hear from anyone by next week.  988 is the new suicide hotline.  Please do not pick at the corner of your mouth.  Use an antibacterial ointment 2-3 times daily (bacitracin, neosporin, or triple antibiotic ointment).  People who drool and have liquid pooling at the corners of the mouth can develop fungal infections.  You don't have large grooves where the fluid can pool, so I doubt this is fungal.  I think you can stop using the clotrimazole cream (it's not harming you, but I doubt it is helping).

## 2022-08-14 NOTE — Progress Notes (Signed)
Chief Complaint  Patient presents with   Consult    Feeling depressed over the last few weeks. Looks like she is supposed to be on 40 mg of citalopram and she called in a refill of  on 07/22/22 and has only been taking . Also has some cracks on lips for while, she drools in her sleep. Any suggestions?   Patient presents for evaluation of some worsening in her moods.  She sees therapist regularly; she suggested seeing doctor to discuss her medications. Moods have been up and down, and reports some manic feelings (reports long history of diagnosis of Bipolar 2) --having a lot of energy, talking nonstop/rapid speech. She has been more down in the last couple of weeks. Moods haven't been well controlled for quite a while.  She had some SI last week, not now; states she wouldn't act on it. States if feeling worse, she would reach out to her husband, therapist, sister if she felt unsafe. Has been to [facility], aware of the [facility] urgent care. Was not aware of 988 suicide hotline.  Review of chart--has been on citalopram for a while.  Was switched to Wellbutrin in August, didn't tolerate due to palpitations, went back on citalopram.  She reports she picked up a refill on 3/30 that was only , and took this for a couple of weeks before realizing the dose was lower.  She has a  refill that she plans to pick up today.   Som stressors--son having some panic attacks, in college (at [employer])  She reports being on "SO MANY" medications (over 20) for her moods, in the past, and recalls a lot of reactions. She was under the care of [facility] at that time.   Her second concern is that of having a sore at the corner of her mouth. She had it bilaterally, the right side resolved pretty quickly on its own. She only noticed this a couple of days ago. She used vaseline and peroxide, as well as aquaphor.  The right sided area improved with this treatment. She started using clotrimazole a couple of days ago, using it twice  daily, and thinks it is helping some. She knows that she drools some.   PMH, PSH, SH reviewed  Outpatient Encounter Medications as of 08/14/2022  Medication Sig Note   calcium citrate (CALCITRATE - DOSED IN MG ELEMENTAL CALCIUM) 950 (200 Ca) MG tablet Take 400 mg of elemental calcium by mouth daily.    citalopram (CELEXA) 40 MG tablet Take 1 tablet (40 mg total) by mouth daily. 08/14/2022: Has been taking  since 07/22/22   fluticasone (FLONASE) 50 MCG/ACT nasal spray Place 1 spray into both nostrils daily.    albuterol (VENTOLIN HFA) 108 (90 Base) MCG/ACT inhaler Inhale 1-2 puffs into the lungs every 6 (six) hours as needed for wheezing or shortness of breath. (Patient not taking: Reported on 08/14/2022) 08/14/2022: Used yesterday   cyclobenzaprine (FLEXERIL) 10 MG tablet Take 10 mg by mouth 3 (three) times daily as needed for muscle spasms. PRN (Patient not taking: Reported on 08/14/2022) 08/14/2022: As needed   omeprazole (PRILOSEC) 40 MG capsule Take 1 capsule (40 mg total) by mouth daily. (Patient not taking: Reported on 08/14/2022) 08/14/2022: As needed   [DISCONTINUED] busPIRone (BUSPAR) 5 MG tablet Take 5 mg by mouth 2 (two) times daily.    [DISCONTINUED] fexofenadine (ALLEGRA) 180 MG tablet Take 180 mg by mouth daily.    [DISCONTINUED] fluconazole (DIFLUCAN) 150 MG tablet Take 1 tablet (150 mg total) by  mouth daily.    [DISCONTINUED] ibuprofen (ADVIL) 800 MG tablet Take 1 tablet (800 mg total) by mouth 2 (two) times daily as needed.    [DISCONTINUED] Vitamin D, Ergocalciferol, (DRISDOL) 1.25 MG (50000 UNIT) CAPS capsule Take 1 capsule (50,000 Units total) by mouth every 7 (seven) days.    No facility-administered encounter medications on file as of 08/14/2022.     Allergies  Allergen Reactions   Amoxicillin Anaphylaxis and Hives    Has patient had a PCN reaction causing immediate rash, facial/tongue/throat swelling, SOB or lightheadedness with hypotension: Yes Has patient had a PCN  reaction causing severe rash involving mucus membranes or skin necrosis: Yes Has patient had a PCN reaction that required hospitalization No Has patient had a PCN reaction occurring within the last 10 years: No If all of the above answers are "NO", then may proceed with Cephalosporin use.    Imitrex [Sumatriptan Base] Anaphylaxis and Hives   Penicillins Anaphylaxis and Hives    Has patient had a PCN reaction causing immediate rash, facial/tongue/throat swelling, SOB or lightheadedness with hypotension: Yes Has patient had a PCN reaction causing severe rash involving mucus membranes or skin necrosis: Yes Has patient had a PCN reaction that required hospitalization No Has patient had a PCN reaction occurring within the last 10 years: No If all of the above answers are "NO", then may proceed with Cephalosporin use.    Tramadol Shortness Of Breath and Palpitations   Norco [Hydrocodone-Acetaminophen] Other (See Comments)    Hallucinations    Percocet [Oxycodone-Acetaminophen] Other (See Comments)    Hallucinations    Soy Allergy Hives    ROS: no f/c, n/v/d, no URI or allergy symptoms. No cough, shortness of breath or wheezing. Occ heartburn.  Moods per HPI.   PHYSICAL EXAM:  BP 122/72   Pulse 84   Ht 5\' 4"  (1.626 m)   Wt 196 lb 3.2 oz (89 kg)   BMI 33.68 kg/m   Wt Readings from Last 3 Encounters:  08/14/22 196 lb 3.2 oz (89 kg)  07/12/22 187 lb 6.3 oz (85 kg)  07/12/22 188 lb (85.3 kg)   Well-appearing female, in no distress. Psych: mildly depressed mood. Speech is not pressured. A few interuptions. Normal eye contact, full range of affect. Normal hygiene and grooming HEENT:  OP is clear.  Partial uppers.  Normal mucosa At the corner of her lips on the left, and slightly external, there is a raised somewhat hyperkeratotic area, with some central ulceration. No deep grooves. Neck: no lymphadenopathy, thyromegaly or mass Heart: regular rate and rhythm Lungs: clear  bilaterally Extremities: no edema      08/14/2022   11:48 AM 11/03/2021    8:59 AM  Depression screen PHQ 2/9  Decreased Interest 1 1  Down, Depressed, Hopeless 2 1  PHQ - 2 Score 3 2  Altered sleeping 2 2  Tired, decreased energy 3 2  Change in appetite 1 2  Feeling bad or failure about yourself  2 1  Trouble concentrating 2 3  Moving slowly or fidgety/restless 3 3  Suicidal thoughts 1 0  PHQ-9 Score 17 15  Difficult doing work/chores Very difficult Extremely dIfficult    ASSESSMENT/PLAN:  Bipolar 2 disorder - per pt, long history, prev treated at [facility] Not on any mood stabilizers, mostly is depressed, but she reports manic cycling. Refer to psych, cont counseling - Plan: Ambulatory referral to Psychiatry  Major depressive disorder, recurrent severe without psychotic features - moods worsened since citalopram dose  inadvertantly lowered.  Increase back to . Refer to psych for med mgmt. Discussed options if suicidal, 44 - Plan: Ambulatory referral to Psychiatry  Mouth sore - doesn't appear to be angulair cheilitis.  Has picked at it, small ulceration. Rec topical antibacterial ointment until healed (can stop antifungal)  I spent 38 minutes dedicated to the care of this patient, including pre-visit review of records, face to face time, post-visit ordering of testing and documentation.  We are referring you to a psychiatrist, for medication changes. You want to go back up to 40 mg of the citalopram--you can double up on what you have (the  tablets) until you can pick up the 40 mg tablet from the pharmacy. Let us know if you don't hear from anyone by next week.  Please do not pick at the corner of your mouth.  Use an antibacterial ointment 2-3 times daily (bacitracin, neosporin, or triple antibiotic ointment).  People who drool and have liquid pooling at the corners of the mouth can develop fungal infections.  You don't have large grooves where the fluid can pool, so I  doubt this is fungal.  I think you can stop using the clotrimazole cream (it's not harming you, but I doubt it is helping).

## 2022-09-19 ENCOUNTER — Ambulatory Visit (INDEPENDENT_AMBULATORY_CARE_PROVIDER_SITE_OTHER): Payer: Medicare Other

## 2022-09-19 VITALS — Ht 64.0 in | Wt 190.0 lb

## 2022-09-19 DIAGNOSIS — Z0000 Encounter for general adult medical examination without abnormal findings: Secondary | ICD-10-CM

## 2022-09-19 NOTE — Patient Instructions (Signed)
[name] , Thank you for taking time to come for your Medicare Wellness Visit. I appreciate your ongoing commitment to your health goals. Please review the following plan we discussed and let me know if I can assist you in the future.   These are the goals we discussed:  Goals      Patient Stated     09/19/2022, wants to keep up exercise        This is a list of the screening recommended for you and due dates:  Health Maintenance  Topic Date Due   Pap Smear  08/29/2009   DTaP/Tdap/Td vaccine (2 - Tdap) 10/22/2009   Flu Shot  11/23/2022   Medicare Annual Wellness Visit  09/19/2023   COVID-19 Vaccine  Completed   HIV Screening  Completed   HPV Vaccine  Aged Out   Hepatitis C Screening  Discontinued    Advanced directives: Advance directive discussed with you today.   Conditions/risks identified: none  Next appointment: Follow up in one year for your annual wellness visit.   Preventive Care 40-64 Years, Female Preventive care refers to lifestyle choices and visits with your health care provider that can promote health and wellness. What does preventive care include? A yearly physical exam. This is also called an annual well check. Dental exams once or twice a year. Routine eye exams. Ask your health care provider how often you should have your eyes checked. Personal lifestyle choices, including: Daily care of your teeth and gums. Regular physical activity. Eating a healthy diet. Avoiding tobacco and drug use. Limiting alcohol use. Practicing safe sex. Taking low-dose aspirin daily starting at age 106. Taking vitamin and mineral supplements as recommended by your health care provider. What happens during an annual well check? The services and screenings done by your health care provider during your annual well check will depend on your age, overall health, lifestyle risk factors, and family history of disease. Counseling  Your health care provider may ask you questions  about your: Alcohol use. Tobacco use. Drug use. Emotional well-being. Home and relationship well-being. Sexual activity. Eating habits. Work and work Astronomer. Method of birth control. Menstrual cycle. Pregnancy history. Screening  You may have the following tests or measurements: Height, weight, and BMI. Blood pressure. Lipid and cholesterol levels. These may be checked every 5 years, or more frequently if you are over 50 years old. Skin check. Lung cancer screening. You may have this screening every year starting at age 83 if you have a 30-pack-year history of smoking and currently smoke or have quit within the past 15 years. Fecal occult blood test (FOBT) of the stool. You may have this test every year starting at age 50. Flexible sigmoidoscopy or colonoscopy. You may have a sigmoidoscopy every 5 years or a colonoscopy every 10 years starting at age 66. Hepatitis C blood test. Hepatitis B blood test. Sexually transmitted disease (STD) testing. Diabetes screening. This is done by checking your blood sugar (glucose) after you have not eaten for a while (fasting). You may have this done every 1-3 years. Mammogram. This may be done every 1-2 years. Talk to your health care provider about when you should start having regular mammograms. This may depend on whether you have a family history of breast cancer. BRCA-related cancer screening. This may be done if you have a family history of breast, ovarian, tubal, or peritoneal cancers. Pelvic exam and Pap test. This may be done every 3 years starting at age 79. Starting at  age 75, this may be done every 5 years if you have a Pap test in combination with an HPV test. Bone density scan. This is done to screen for osteoporosis. You may have this scan if you are at high risk for osteoporosis. Discuss your test results, treatment options, and if necessary, the need for more tests with your health care provider. Vaccines  Your health care  provider may recommend certain vaccines, such as: Influenza vaccine. This is recommended every year. Tetanus, diphtheria, and acellular pertussis (Tdap, Td) vaccine. You may need a Td booster every 10 years. Zoster vaccine. You may need this after age 57. Pneumococcal 13-valent conjugate (PCV13) vaccine. You may need this if you have certain conditions and were not previously vaccinated. Pneumococcal polysaccharide (PPSV23) vaccine. You may need one or two doses if you smoke cigarettes or if you have certain conditions. Talk to your health care provider about which screenings and vaccines you need and how often you need them. This information is not intended to replace advice given to you by your health care provider. Make sure you discuss any questions you have with your health care provider. Document Released: 05/07/2015 Document Revised: 12/29/2015 Document Reviewed: 02/09/2015 Elsevier Interactive Patient Education  2017 [employer].    Fall Prevention in the Home Falls can cause injuries. They can happen to people of all ages. There are many things you can do to make your home safe and to help prevent falls. What can I do on the outside of my home? Regularly fix the edges of walkways and driveways and fix any cracks. Remove anything that might make you trip as you walk through a door, such as a raised step or threshold. Trim any bushes or trees on the path to your home. Use bright outdoor lighting. Clear any walking paths of anything that might make someone trip, such as rocks or tools. Regularly check to see if handrails are loose or broken. Make sure that both sides of any steps have handrails. Any raised decks and porches should have guardrails on the edges. Have any leaves, snow, or ice cleared regularly. Use sand or salt on walking paths during winter. Clean up any spills in your garage right away. This includes oil or grease spills. What can I do in the bathroom? Use night  lights. Install grab bars by the toilet and in the tub and shower. Do not use towel bars as grab bars. Use non-skid mats or decals in the tub or shower. If you need to sit down in the shower, use a plastic, non-slip stool. Keep the floor dry. Clean up any water that spills on the floor as soon as it happens. Remove soap buildup in the tub or shower regularly. Attach bath mats securely with double-sided non-slip rug tape. Do not have throw rugs and other things on the floor that can make you trip. What can I do in the bedroom? Use night lights. Make sure that you have a light by your bed that is easy to reach. Do not use any sheets or blankets that are too big for your bed. They should not hang down onto the floor. Have a firm chair that has side arms. You can use this for support while you get dressed. Do not have throw rugs and other things on the floor that can make you trip. What can I do in the kitchen? Clean up any spills right away. Avoid walking on wet floors. Keep items that you use a lot  in easy-to-reach places. If you need to reach something above you, use a strong step stool that has a grab bar. Keep electrical cords out of the way. Do not use floor polish or wax that makes floors slippery. If you must use wax, use non-skid floor wax. Do not have throw rugs and other things on the floor that can make you trip. What can I do with my stairs? Do not leave any items on the stairs. Make sure that there are handrails on both sides of the stairs and use them. Fix handrails that are broken or loose. Make sure that handrails are as long as the stairways. Check any carpeting to make sure that it is firmly attached to the stairs. Fix any carpet that is loose or worn. Avoid having throw rugs at the top or bottom of the stairs. If you do have throw rugs, attach them to the floor with carpet tape. Make sure that you have a light switch at the top of the stairs and the bottom of the stairs. If  you do not have them, ask someone to add them for you. What else can I do to help prevent falls? Wear shoes that: Do not have high heels. Have rubber bottoms. Are comfortable and fit you well. Are closed at the toe. Do not wear sandals. If you use a stepladder: Make sure that it is fully opened. Do not climb a closed stepladder. Make sure that both sides of the stepladder are locked into place. Ask someone to hold it for you, if possible. Clearly mark and make sure that you can see: Any grab bars or handrails. First and last steps. Where the edge of each step is. Use tools that help you move around (mobility aids) if they are needed. These include: Canes. Walkers. Scooters. Crutches. Turn on the lights when you go into a dark area. Replace any light bulbs as soon as they burn out. Set up your furniture so you have a clear path. Avoid moving your furniture around. If any of your floors are uneven, fix them. If there are any pets around you, be aware of where they are. Review your medicines with your doctor. Some medicines can make you feel dizzy. This can increase your chance of falling. Ask your doctor what other things that you can do to help prevent falls. This information is not intended to replace advice given to you by your health care provider. Make sure you discuss any questions you have with your health care provider. Document Released: 02/04/2009 Document Revised: 09/16/2015 Document Reviewed: 05/15/2014 Elsevier Interactive Patient Education  2017 [employer].

## 2022-09-19 NOTE — Progress Notes (Signed)
I connected with  [name] on 09/19/22 by a audio enabled telemedicine application and verified that I am speaking with the correct person using two identifiers.  Patient Location: Home  Provider Location: Office/Clinic  I discussed the limitations of evaluation and management by telemedicine. The patient expressed understanding and agreed to proceed.  Subjective:   [name] is a 42 y.o. female who presents for an Initial Medicare Annual Wellness Visit.  Review of Systems     Cardiac Risk Factors include: obesity (BMI >30kg/m2)     Objective:    Today's Vitals   09/19/22 1325  Weight: 190 lb (86.2 kg)  Height: 5\' 4"  (1.626 m)   Body mass index is 32.61 kg/m.     09/19/2022    1:30 PM 07/12/2022   11:12 AM 12/12/2018    6:00 PM 12/12/2018    3:56 PM 04/20/2018    8:37 AM 03/01/2018    1:50 PM 04/18/2017    1:45 PM  Advanced Directives  Does Patient Have a Medical Advance Directive? No No No No No  No  Would patient like information on creating a medical advance directive?   No - Patient declined No - Patient declined No - Patient declined  No - Patient declined     Information is confidential and restricted. Go to Review Flowsheets to unlock data.    Current Medications (verified) Outpatient Encounter Medications as of 09/19/2022  Medication Sig   albuterol (VENTOLIN HFA) 108 (90 Base) MCG/ACT inhaler Inhale 1-2 puffs into the lungs every 6 (six) hours as needed for wheezing or shortness of breath.   calcium citrate (CALCITRATE - DOSED IN MG ELEMENTAL CALCIUM) 950 (200 Ca) MG tablet Take 400 mg of elemental calcium by mouth daily.   citalopram (CELEXA) 40 MG tablet Take 1 tablet (40 mg total) by mouth daily.   fluticasone (FLONASE) 50 MCG/ACT nasal spray Place 1 spray into both nostrils daily.   omeprazole (PRILOSEC) 40 MG capsule Take 1 capsule (40 mg total) by mouth daily.   cyclobenzaprine (FLEXERIL) 10 MG tablet Take 10 mg by mouth 3 (three)  times daily as needed for muscle spasms. PRN (Patient not taking: Reported on 08/14/2022)   [DISCONTINUED] fexofenadine (ALLEGRA) 180 MG tablet Take 180 mg by mouth daily.   No facility-administered encounter medications on file as of 09/19/2022.    Allergies (verified) Amoxicillin, Imitrex [sumatriptan base], Penicillins, Tramadol, Norco [hydrocodone-acetaminophen], Percocet [oxycodone-acetaminophen], and Soy allergy   History: Past Medical History:  Diagnosis Date   Anemia    h/o   Anginal pain (HCC)    pt. takes nitrostat- as needed, hasn't had since 04/2012, sees Dr. 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 2013   Neuromuscular disorder (HCC)    muscle spasms-back & arms    SVT (supraventricular tachycardia) 01/22/2012   UTI (lower urinary tract infection) 01/20/2012   Past Surgical History:  Procedure Laterality Date   ABDOMINAL HYSTERECTOMY     PERIPHERALLY INSERTED CENTRAL CATHETER INSERTION     during event of Meningitis- then d/c'd to home /w & treated /w antibiotics    SUBOCCIPITAL CRANIECTOMY CERVICAL LAMINECTOMY N/A  07/22/2012   Procedure: SUBOCCIPITAL CRANIECTOMY CERVICAL LAMINECTOMY/DURAPLASTY;  Surgeon: Hewitt Shorts, MD;  Location: [facility];  Service: Neurosurgery;  Laterality: N/A;  Suboccipital craniectomy with upper cervial laminectomy with duroplasty    TUBAL LIGATION     VAGINAL DELIVERY     x3   WISDOM TOOTH EXTRACTION     Family History  Problem Relation Age of Onset   Heart attack Mother    Healthy Father    Social History   Socioeconomic History   Marital status: Married    Spouse name: Not on file   Number of children: Not on file    Years of education: Not on file   Highest education level: Not on file  Occupational History   Not on file  Tobacco Use   Smoking status: Never    Passive exposure: Never   Smokeless tobacco: Never  Vaping Use   Vaping Use: Never used  Substance and Sexual Activity   Alcohol use: No   Drug use: No   Sexual activity: Yes    Birth control/protection: Surgical  Other Topics Concern   Not on file  Social History Narrative   Not on file   Social Determinants of Health   Financial Resource Strain: Low Risk  (09/19/2022)   Overall Financial Resource Strain (CARDIA)    Difficulty of Paying Living Expenses: Not hard at all  Food Insecurity: No Food Insecurity (09/19/2022)   Hunger Vital Sign    Worried About Running Out of Food in the Last Year: Never true    Ran Out of Food in the Last Year: Never true  Transportation Needs: No Transportation Needs (09/19/2022)   PRAPARE - Administrator, Civil Service (Medical): No    Lack of Transportation (Non-Medical): No  Physical Activity: Insufficiently Active (09/19/2022)   Exercise Vital Sign    Days of Exercise per Week: 3 days    Minutes of Exercise per Session: 30 min  Stress: No Stress Concern Present (09/19/2022)   [employer] of Occupational Health - Occupational Stress Questionnaire    Feeling of Stress : Only a little  Social Connections: Not on file    Tobacco Counseling Counseling given: Not Answered   Clinical Intake:  Pre-visit preparation completed: Yes  Pain : No/denies pain     Nutritional Status: BMI > 30  Obese Nutritional Risks: None Diabetes: No  How often do you need to have someone help you when you read instructions, pamphlets, or other written materials from your doctor or pharmacy?: 1 - Never  Diabetic? no  Interpreter Needed?: No  Information entered by :: NAllen LPN   Activities of Daily Living    09/19/2022    1:32 PM 11/03/2021    9:01 AM  In your present state of health,  do you have any difficulty performing the following activities:  Hearing? 0 0  Vision? 0 1  Difficulty concentrating or making decisions? 1 1  Comment trouble with decisions   Walking or climbing stairs? 0 0  Dressing or bathing? 0 0  Doing errands, shopping? 0 0  Preparing Food and eating ? N   Using the Toilet? N   In the past six months, have you accidently leaked urine? N   Do you have problems with loss of bowel control? N   Managing your Medications? N   Managing your Finances? N   Housekeeping or managing your Housekeeping? N     Patient Care Team: Early, Sung Amabile, NP  as PCP - General (Nurse Practitioner)  Indicate any recent Medical Services you may have received from other than [facility] providers in the past year (date may be approximate).     Assessment:   This is a routine wellness examination for Brookelyn.  Hearing/Vision screen Vision Screening - Comments:: Regular eye exams,   Dietary issues and exercise activities discussed: Current Exercise Habits: Home exercise routine, Type of exercise: treadmill, Time (Minutes): 30, Frequency (Times/Week): 3, Weekly Exercise (Minutes/Week): 90   Goals Addressed             This Visit's Progress    Patient Stated       09/19/2022, wants to keep up exercise       Depression Screen    09/19/2022    1:31 PM 08/14/2022   11:48 AM 11/03/2021    8:59 AM  PHQ 2/9 Scores  PHQ - 2 Score 2 3 2   PHQ- 9 Score 5 17 15     Fall Risk    09/19/2022    1:30 PM 12/08/2021    3:29 PM 11/03/2021    8:59 AM  Fall Risk   Falls in the past year? 0 0 0  Number falls in past yr: 0 0 0  Injury with Fall? 0 0 0  Risk for fall due to : Medication side effect No Fall Risks No Fall Risks  Follow up Falls prevention discussed;Education provided;Falls evaluation completed Falls evaluation completed;Education provided Falls evaluation completed    FALL RISK PREVENTION PERTAINING TO THE HOME:  Any stairs in or around the home? Yes  If so, are  there any without handrails? No  Home free of loose throw rugs in walkways, pet beds, electrical cords, etc? Yes  Adequate lighting in your home to reduce risk of falls? Yes   ASSISTIVE DEVICES UTILIZED TO PREVENT FALLS:  Life alert? No  Use of a cane, walker or w/c? No  Grab bars in the bathroom? No  Shower chair or bench in shower? No  Elevated toilet seat or a handicapped toilet? No   TIMED UP AND GO:  Was the test performed? No .      Cognitive Function:        09/19/2022    1:33 PM  6CIT Screen  What Year? 0 points  What month? 0 points  What time? 0 points  Count back from 20 2 points  Months in reverse 0 points  Repeat phrase 8 points  Total Score 10 points    Immunizations Immunization History  Administered Date(s) Administered   COVID-19, mRNA, vaccine(Comirnaty)12 years and older 03/31/2022   Td 10/23/1999    TDAP status: Due, Education has been provided regarding the importance of this vaccine. Advised may receive this vaccine at local pharmacy or Health Dept. Aware to provide a copy of the vaccination record if obtained from local pharmacy or Health Dept. Verbalized acceptance and understanding.  Flu Vaccine status: Up to date  Pneumococcal vaccine status: Up to date  Covid-19 vaccine status: Completed vaccines  Qualifies for Shingles Vaccine? No   Zostavax completed  n/a   Shingrix Completed?: n/a  Screening Tests Health Maintenance  Topic Date Due   Medicare Annual Wellness (AWV)  Never done   PAP SMEAR-Modifier  08/29/2009   DTaP/Tdap/Td (2 - Tdap) 10/22/2009   INFLUENZA VACCINE  11/23/2022   COVID-19 Vaccine  Completed   HIV Screening  Completed   HPV VACCINES  Aged Out   Hepatitis C Screening  Discontinued  Health Maintenance  Health Maintenance Due  Topic Date Due   Medicare Annual Wellness (AWV)  Never done   PAP SMEAR-Modifier  08/29/2009   DTaP/Tdap/Td (2 - Tdap) 10/22/2009    Colorectal cancer screening:  n/a  Mammogram status: Completed 08/05/2021. Repeat every year  Bone Density status: n/a  Lung Cancer Screening: (Low Dose CT Chest recommended if Age 18-80 years, 30 pack-year currently smoking OR have quit w/in 15years.) does not qualify.   Lung Cancer Screening Referral: no  Additional Screening:  Hepatitis C Screening: does not qualify;   Vision Screening: Recommended annual ophthalmology exams for early detection of glaucoma and other disorders of the eye. Is the patient up to date with their annual eye exam?  Yes  Who is the provider or what is the name of the office in which the patient attends annual eye exams? Can't remember name If pt is not established with a provider, would they like to be referred to a provider to establish care? No .   Dental Screening: Recommended annual dental exams for proper oral hygiene  Community Resource Referral / Chronic Care Management: CRR required this visit?  No   CCM required this visit?  No      Plan:     I have personally reviewed and noted the following in the patient's chart:   Medical and social history Use of alcohol, tobacco or illicit drugs  Current medications and supplements including opioid prescriptions. Patient is not currently taking opioid prescriptions. Functional ability and status Nutritional status Physical activity Advanced directives List of other physicians Hospitalizations, surgeries, and ER visits in previous 12 months Vitals Screenings to include cognitive, depression, and falls Referrals and appointments  In addition, I have reviewed and discussed with patient certain preventive protocols, quality metrics, and best practice recommendations. A written personalized care plan for preventive services as well as general preventive health recommendations were provided to patient.     Barb Merino, LPN   07/15/4008   Nurse Notes: none  Due to this being a virtual visit, the after visit summary with  patients personalized plan was offered to patient via mail or my-chart.  Patient would like to access on my-chart

## 2022-10-03 ENCOUNTER — Other Ambulatory Visit: Payer: Self-pay | Admitting: Nurse Practitioner

## 2022-10-03 DIAGNOSIS — K219 Gastro-esophageal reflux disease without esophagitis: Secondary | ICD-10-CM

## 2022-10-10 ENCOUNTER — Ambulatory Visit (INDEPENDENT_AMBULATORY_CARE_PROVIDER_SITE_OTHER): Payer: Medicare Other | Admitting: Nurse Practitioner

## 2022-10-10 ENCOUNTER — Encounter: Payer: Self-pay | Admitting: Nurse Practitioner

## 2022-10-10 VITALS — BP 118/64 | HR 108 | Ht 64.0 in | Wt 193.0 lb

## 2022-10-10 DIAGNOSIS — L739 Follicular disorder, unspecified: Secondary | ICD-10-CM

## 2022-10-10 DIAGNOSIS — F411 Generalized anxiety disorder: Secondary | ICD-10-CM | POA: Diagnosis not present

## 2022-10-10 DIAGNOSIS — F4001 Agoraphobia with panic disorder: Secondary | ICD-10-CM | POA: Diagnosis not present

## 2022-10-10 DIAGNOSIS — R21 Rash and other nonspecific skin eruption: Secondary | ICD-10-CM

## 2022-10-10 MED ORDER — HYDROXYZINE HCL 25 MG PO TABS: 12.5000 mg | ORAL_TABLET | Freq: Three times a day (TID) | ORAL | 3 refills | Status: DC | PRN

## 2022-10-10 MED ORDER — BETAMETHASONE DIPROPIONATE 0.05 % EX CREA: TOPICAL_CREAM | Freq: Two times a day (BID) | CUTANEOUS | 2 refills | Status: DC

## 2022-10-10 NOTE — Patient Instructions (Addendum)
I want you to continue taking the Citalopram 40mg  every day.   I have sent in a medication called hydroxyzine that you can when you feel like a panic attack is coming on. This will help with relaxation.   Look up "Progressive Muscle Relaxation" on YouTube to do more of the exercises you did today in the office.   The itching is irritation from the razor. I sent in cream to help with the itching.    Panic Attack A panic attack is when you suddenly feel very afraid, uncomfortable, or nervous (anxious). A panic attack can happen when you are scared, or it may happen for no reason. A panic attack can feel like a heart attack or stroke. See your doctor when you have a panic attack to make sure you are not having a heart attack or stroke. What are the causes? Experiencing things that threaten your life, such as a war. Feeling worried or nervous for a long time (anxiety disorder). Being sad (depressed). Panic disorder. Certain medical conditions. Other causes may include: Certain medicines. Taking certain supplements. Illegal drugs. What increases the risk? Having another mental health condition. Using alcohol or drugs. Being under a lot of stress. Having events in your life that cause worry and sadness. What are the signs or symptoms? A panic attack: Starts suddenly. May last 5-10 minutes. Symptoms include one or more of these: A pounding heart. A feeling that your heart is beating in an unusual way or faster than normal (palpitations). Sweating or shaking. Feeling short of breath. Chest pain. Feeling like you may vomit (nauseous). Feeling dizzy or like you might faint. Other symptoms may include: Chills or hot flashes. Numbness or tingling in your lips, hands, or feet. Feeling confused. Fear of losing control. Fear of dying. How is this treated? A panic attack is a symptom of another condition. Treatment depends on the cause of the panic attack. If the cause is a medical  problem, your doctor will treat that problem or refer you to a specialist. If the cause is emotional, you may be given medicines or referred to a counselor. If the cause is a medicine, your doctor may tell you to stop the medicine, change your dose, or take a different medicine. If the cause is an illegal drug, treatment may involve letting the drug wear off and taking medicine to help the drug leave your body or to stop its effects. Attacks caused by heavy drug use may continue even if you stop using the drug. Most panic attacks go away after the cause is treated. Follow these instructions at home: Alcohol use Do not drink alcohol if: Your doctor tells you not to drink. You are pregnant, may be pregnant, or are planning to become pregnant. If you drink alcohol: Limit how much you have to: 0-1 drink a day for women. 0-2 drinks a day for men. Know how much alcohol is in your drink. In the [place] one drink equals one 12 oz bottle of beer (355 mL), one 5 oz glass of wine (148 mL), or one 1 oz glass of hard liquor (44 mL). General instructions  Take over-the-counter and prescription medicines only as told by your doctor. If you feel worried or nervous, try not to have caffeine. Take good care of your health. To do this: Eat healthy. Make sure to eat fresh fruits and vegetables, whole grains, lean meats, and low-fat dairy. Get enough sleep. Try to sleep for 7-8 hours each night. Exercise. Try to be  active for 30 minutes 5 or more days a week. Do not smoke or use any products that contain nicotine or tobacco. If you need help quitting, ask your doctor. Keep all follow-up visits. Where to find more information [facility]): [employer] [facility]): http://www.maynard.net/ Contact a doctor if: Your symptoms do not get better. Your symptoms get worse. You are not able to take your medicines as told. Get help right  away if: You have thoughts of hurting yourself or others. Get help right away if you feel like you may hurt yourself or others, or have thoughts about taking your own life. Go to your nearest emergency room or: Call 911. Call the National Suicide Prevention Lifeline at 423 850 6248 or 988. This is open 24 hours a day. Text the Crisis Text Line at 979-463-2748. Summary A panic attack is when you suddenly feel very afraid, uncomfortable, or nervous (anxious). See your doctor when you have a panic attack to make sure that you do not have another serious problem. If you feel like you may hurt yourself or others, get help right away. Call 911. This information is not intended to replace advice given to you by your health care provider. Make sure you discuss any questions you have with your health care provider. Document Revised: 11/18/2020 Document Reviewed: 11/18/2020 Elsevier Patient Education  2024 [employer].

## 2022-10-10 NOTE — Progress Notes (Signed)
Tollie Eth, DNP, AGNP-c [facility] [address] [place], [place] [address] [phone]   ACUTE VISIT- ESTABLISHED PATIENT  Blood pressure 118/64, pulse (!) 108, height 5\' 4"  (1.626 m), weight 193 lb (87.5 kg).  Subjective:  HPI [name] is a 42 y.o. female presents to day for evaluation of: anxiety and rash.   Mental Health The patient reports having seen Dr. Lynelle Doctor for anxiety symptoms a couple of months ago and is currently waiting to see a psychiatrist. She mentions that her recent therapy session went well and she is in the process of finding a job.  The patient has been taking 40 mg of citalopram for over 10 years and expresses concern about its effectiveness. She is open to trying bipolar medications but has had negative experiences in the past. The patient has signed release forms for a psychiatry appointment but does not have a date yet. The patient experiences chest pain and tightness in the throat, which she believes may be related to anxiety. She has been using thunderstorm and ocean sounds for relaxation and has practiced progressive muscle relaxation. The patient mentions a recent family loss due to cocaine addiction, which may have triggered her anxiety. The patient reports planning to go to the gym with her daughter for stress relief.  Skin Issue The patient presents with a skin issue in the pubic area after shaving, with no discharge or burning during urination. She has tried hydrocortisone cream with minimal relief and reports no new sexual partners.   PMH, Medications, and Allergies reviewed and updated in chart as appropriate.   ROS negative except for what is listed in HPI. Objective:  Physical Exam Vitals and nursing note reviewed.  Constitutional:      General: She is not in acute distress.    Appearance: Normal appearance.  Eyes:     Conjunctiva/sclera: Conjunctivae normal.  Cardiovascular:     Rate and Rhythm: Normal rate  and regular rhythm.     Pulses: Normal pulses.     Heart sounds: Normal heart sounds.  Pulmonary:     Effort: Pulmonary effort is normal.     Breath sounds: Normal breath sounds.  Abdominal:     Tenderness: There is no abdominal tenderness. There is no right CVA tenderness, left CVA tenderness or guarding.  Skin:    General: Skin is warm and dry.     Comments: Follicular rash noted in the suprapubic region consistent with infected hair follicles from shaving.   Neurological:     Mental Status: She is alert and oriented to person, place, and time.  Psychiatric:        Behavior: Behavior normal.         Assessment & Plan:   Problem List Items Addressed This Visit     Generalized anxiety disorder - Primary    Patient reports ongoing struggles with anxiety and panic attacks. Current medication is citalopram 40 mg daily, which has provided some relief but breakthrough symptoms persist. She reports taking medications consistent with bipolar treatment in the past, which makes me speculate if this was a diagnosis. At this time she is awaiting psychiatry evaluation, therefore, we will not begin any medications for bipolar at this time. We discussed additional management strategies today.  Plan: - Continue citalopram 40 mg daily - Prescribe hydroxyzine for as-needed use during panic attacks (up to 3 times per day)   - Encourage the use of progressive muscle relaxation techniques - Recommend regular exercise, such as going  to the gym, to help manage anxiety - Monitor response to treatment and adjust as needed      Relevant Medications   hydrOXYzine (ATARAX) 25 MG tablet   Folliculitis    Patient reports an area of skin irritation and inflammation, likely related to folliculitis or an ingrown hair. Evaluation is consistent with this finding. At this time there are no signs of infection present, which is reassuring. We will initiate topical treatment to help manage symptoms.  Plan:   -  Prescribe topical cream for itching and inflammation (apply twice daily) - Instruct the patient to monitor the affected area and report any worsening or lack of improvement      Relevant Medications   betamethasone dipropionate 0.05 % cream   Other Visit Diagnoses     Panic disorder with agoraphobia       Relevant Medications   hydrOXYzine (ATARAX) 25 MG tablet         Time: 26 minutes, >50% spent counseling, care coordination, chart review, and documentation.    Tollie Eth, DNP, AGNP-c   History, Medications, Surgery, SDOH, and Family History reviewed and updated as appropriate.

## 2022-10-16 ENCOUNTER — Encounter: Payer: Self-pay | Admitting: Nurse Practitioner

## 2022-10-16 DIAGNOSIS — F339 Major depressive disorder, recurrent, unspecified: Secondary | ICD-10-CM

## 2022-10-16 DIAGNOSIS — F422 Mixed obsessional thoughts and acts: Secondary | ICD-10-CM

## 2022-10-16 DIAGNOSIS — F411 Generalized anxiety disorder: Secondary | ICD-10-CM

## 2022-10-25 ENCOUNTER — Other Ambulatory Visit (INDEPENDENT_AMBULATORY_CARE_PROVIDER_SITE_OTHER): Payer: Medicare Other

## 2022-10-25 ENCOUNTER — Other Ambulatory Visit: Payer: Self-pay | Admitting: Nurse Practitioner

## 2022-10-25 DIAGNOSIS — R8290 Unspecified abnormal findings in urine: Secondary | ICD-10-CM

## 2022-10-25 DIAGNOSIS — N3000 Acute cystitis without hematuria: Secondary | ICD-10-CM

## 2022-10-25 LAB — POCT URINALYSIS DIP (CLINITEK)
Bilirubin, UA: NEGATIVE
Blood, UA: NEGATIVE
Glucose, UA: NEGATIVE mg/dL
Ketones, POC UA: NEGATIVE mg/dL
Nitrite, UA: NEGATIVE
POC PROTEIN,UA: NEGATIVE
Spec Grav, UA: 1.02 (ref 1.010–1.025)
Urobilinogen, UA: 0.2 E.U./dL
pH, UA: 6.5 (ref 5.0–8.0)

## 2022-10-25 MED ORDER — FLUCONAZOLE 150 MG PO TABS: 150.0000 mg | ORAL_TABLET | Freq: Once | ORAL | 0 refills | Status: AC

## 2022-10-25 MED ORDER — NITROFURANTOIN MONOHYD MACRO 100 MG PO CAPS: 100.0000 mg | ORAL_CAPSULE | Freq: Two times a day (BID) | ORAL | 0 refills | Status: DC

## 2022-10-25 NOTE — Telephone Encounter (Signed)
I called patient and advised her of [name] message. Patient agreed to come by the office to give a urine sample.   Sample was collected and checked. Please review results. Do you want to still send urine out for urine culture?

## 2022-10-30 LAB — URINE CULTURE

## 2022-11-03 ENCOUNTER — Encounter: Payer: Self-pay | Admitting: Internal Medicine

## 2022-11-03 ENCOUNTER — Other Ambulatory Visit (HOSPITAL_BASED_OUTPATIENT_CLINIC_OR_DEPARTMENT_OTHER): Payer: Self-pay | Admitting: Nurse Practitioner

## 2022-11-03 DIAGNOSIS — R519 Headache, unspecified: Secondary | ICD-10-CM

## 2022-11-03 MED ORDER — SERTRALINE HCL 50 MG PO TABS: 50.0000 mg | ORAL_TABLET | Freq: Every day | ORAL | 3 refills | Status: DC

## 2022-11-03 NOTE — Telephone Encounter (Signed)
Pt needs a refill on this.

## 2022-11-03 NOTE — Addendum Note (Signed)
Addended by: Mitsuo Budnick, [place] Dec E on: 11/03/2022 02:37 PM   Modules accepted: Orders

## 2022-11-05 ENCOUNTER — Encounter (HOSPITAL_COMMUNITY): Payer: Self-pay

## 2022-11-05 ENCOUNTER — Emergency Department (HOSPITAL_COMMUNITY): Payer: Medicare Other

## 2022-11-05 ENCOUNTER — Emergency Department (HOSPITAL_COMMUNITY)
Admission: EM | Admit: 2022-11-05 | Discharge: 2022-11-05 | Disposition: A | Discharge status: Home / Self Care | Payer: Medicare Other | Source: Home / Self Care | Attending: Emergency Medicine | Admitting: Emergency Medicine

## 2022-11-05 ENCOUNTER — Emergency Department (HOSPITAL_COMMUNITY)
Admission: EM | Admit: 2022-11-05 | Discharge: 2022-11-05 | Disposition: A | Discharge status: Home / Self Care | Payer: Medicare Other | Attending: Emergency Medicine | Admitting: Emergency Medicine

## 2022-11-05 DIAGNOSIS — R531 Weakness: Secondary | ICD-10-CM | POA: Insufficient documentation

## 2022-11-05 DIAGNOSIS — R569 Unspecified convulsions: Secondary | ICD-10-CM | POA: Insufficient documentation

## 2022-11-05 DIAGNOSIS — R4182 Altered mental status, unspecified: Secondary | ICD-10-CM | POA: Diagnosis present

## 2022-11-05 DIAGNOSIS — J45909 Unspecified asthma, uncomplicated: Secondary | ICD-10-CM | POA: Insufficient documentation

## 2022-11-05 DIAGNOSIS — R4689 Other symptoms and signs involving appearance and behavior: Secondary | ICD-10-CM | POA: Diagnosis not present

## 2022-11-05 LAB — CBC
HCT: 37.9 % (ref 36.0–46.0)
Hemoglobin: 12.4 g/dL (ref 12.0–15.0)
MCH: 28.8 pg (ref 26.0–34.0)
MCHC: 32.7 g/dL (ref 30.0–36.0)
MCV: 87.9 fL (ref 80.0–100.0)
Platelets: 317 10*3/uL (ref 150–400)
RBC: 4.31 MIL/uL (ref 3.87–5.11)
RDW: 12.4 % (ref 11.5–15.5)
WBC: 6.9 10*3/uL (ref 4.0–10.5)
nRBC: 0 % (ref 0.0–0.2)

## 2022-11-05 LAB — COMPREHENSIVE METABOLIC PANEL WITH GFR
ALT: 11 U/L (ref 0–44)
ALT: 10 U/L (ref 0–44)
AST: 21 U/L (ref 15–41)
AST: 19 U/L (ref 15–41)
Albumin: 3.4 g/dL — ABNORMAL LOW (ref 3.5–5.0)
Albumin: 3.6 g/dL (ref 3.5–5.0)
Alkaline Phosphatase: 75 U/L (ref 38–126)
Alkaline Phosphatase: 76 U/L (ref 38–126)
Anion gap: 12 (ref 5–15)
Anion gap: 10 (ref 5–15)
BUN: 5 mg/dL — ABNORMAL LOW (ref 6–20)
BUN: <5 mg/dL — ABNORMAL LOW (ref 6–20)
CO2: 21 mmol/L — ABNORMAL LOW (ref 22–32)
CO2: 22 mmol/L (ref 22–32)
Calcium: 9.3 mg/dL (ref 8.9–10.3)
Calcium: 9.2 mg/dL (ref 8.9–10.3)
Chloride: 104 mmol/L (ref 98–111)
Chloride: 104 mmol/L (ref 98–111)
Creatinine, Ser: 0.76 mg/dL (ref 0.44–1.00)
Creatinine, Ser: 0.8 mg/dL (ref 0.44–1.00)
GFR, Estimated: >60 mL/min
GFR, Estimated: >60 mL/min
Glucose, Bld: 119 mg/dL — ABNORMAL HIGH (ref 70–99)
Glucose, Bld: 108 mg/dL — ABNORMAL HIGH (ref 70–99)
Potassium: 3.7 mmol/L (ref 3.5–5.1)
Potassium: 3.5 mmol/L (ref 3.5–5.1)
Sodium: 137 mmol/L (ref 135–145)
Sodium: 136 mmol/L (ref 135–145)
Total Bilirubin: 0.2 mg/dL — ABNORMAL LOW (ref 0.3–1.2)
Total Bilirubin: 0.3 mg/dL (ref 0.3–1.2)
Total Protein: 6.8 g/dL (ref 6.5–8.1)
Total Protein: 7.2 g/dL (ref 6.5–8.1)

## 2022-11-05 LAB — DIFFERENTIAL
Abs Immature Granulocytes: 0.01 10*3/uL (ref 0.00–0.07)
Basophils Absolute: 0 10*3/uL (ref 0.0–0.1)
Basophils Relative: 1 %
Eosinophils Absolute: 0.1 10*3/uL (ref 0.0–0.5)
Eosinophils Relative: 1 %
Immature Granulocytes: 0 %
Lymphocytes Relative: 39 %
Lymphs Abs: 2.7 10*3/uL (ref 0.7–4.0)
Monocytes Absolute: 0.4 10*3/uL (ref 0.1–1.0)
Monocytes Relative: 6 %
Neutro Abs: 3.6 10*3/uL (ref 1.7–7.7)
Neutrophils Relative %: 53 %

## 2022-11-05 LAB — CBC WITH DIFFERENTIAL/PLATELET
Abs Immature Granulocytes: 0.02 K/uL (ref 0.00–0.07)
Basophils Absolute: 0.1 K/uL (ref 0.0–0.1)
Basophils Relative: 1 %
Eosinophils Absolute: 0.1 K/uL (ref 0.0–0.5)
Eosinophils Relative: 2 %
HCT: 36.7 % (ref 36.0–46.0)
Hemoglobin: 12.1 g/dL (ref 12.0–15.0)
Immature Granulocytes: 0 %
Lymphocytes Relative: 33 %
Lymphs Abs: 2 K/uL (ref 0.7–4.0)
MCH: 29.8 pg (ref 26.0–34.0)
MCHC: 33 g/dL (ref 30.0–36.0)
MCV: 90.4 fL (ref 80.0–100.0)
Monocytes Absolute: 0.4 K/uL (ref 0.1–1.0)
Monocytes Relative: 6 %
Neutro Abs: 3.5 K/uL (ref 1.7–7.7)
Neutrophils Relative %: 58 %
Platelets: 286 K/uL (ref 150–400)
RBC: 4.06 MIL/uL (ref 3.87–5.11)
RDW: 12.4 % (ref 11.5–15.5)
WBC: 6.1 K/uL (ref 4.0–10.5)
nRBC: 0 % (ref 0.0–0.2)

## 2022-11-05 LAB — PROTIME-INR
INR: 1 (ref 0.8–1.2)
Prothrombin Time: 13.5 s (ref 11.4–15.2)

## 2022-11-05 LAB — MAGNESIUM: Magnesium: 1.7 mg/dL (ref 1.7–2.4)

## 2022-11-05 LAB — URINALYSIS, W/ REFLEX TO CULTURE (INFECTION SUSPECTED)
Bilirubin Urine: NEGATIVE
Glucose, UA: NEGATIVE mg/dL
Hgb urine dipstick: NEGATIVE
Ketones, ur: NEGATIVE mg/dL
Leukocytes,Ua: NEGATIVE
Nitrite: NEGATIVE
Protein, ur: NEGATIVE mg/dL
Specific Gravity, Urine: 1.003 — ABNORMAL LOW (ref 1.005–1.030)
pH: 7 (ref 5.0–8.0)

## 2022-11-05 LAB — RAPID URINE DRUG SCREEN, HOSP PERFORMED
Amphetamines: NOT DETECTED
Amphetamines: NOT DETECTED
Barbiturates: NOT DETECTED
Barbiturates: NOT DETECTED
Benzodiazepines: NOT DETECTED
Benzodiazepines: POSITIVE — AB
Cocaine: NOT DETECTED
Cocaine: NOT DETECTED
Opiates: NOT DETECTED
Opiates: NOT DETECTED
Tetrahydrocannabinol: NOT DETECTED
Tetrahydrocannabinol: NOT DETECTED

## 2022-11-05 LAB — I-STAT CHEM 8, ED
BUN: <3 mg/dL — ABNORMAL LOW (ref 6–20)
Calcium, Ion: 1.16 mmol/L (ref 1.15–1.40)
Chloride: 104 mmol/L (ref 98–111)
Creatinine, Ser: 0.7 mg/dL (ref 0.44–1.00)
Glucose, Bld: 106 mg/dL — ABNORMAL HIGH (ref 70–99)
HCT: 38 % (ref 36.0–46.0)
Hemoglobin: 12.9 g/dL (ref 12.0–15.0)
Potassium: 3.5 mmol/L (ref 3.5–5.1)
Sodium: 137 mmol/L (ref 135–145)
TCO2: 22 mmol/L (ref 22–32)

## 2022-11-05 LAB — URINALYSIS, ROUTINE W REFLEX MICROSCOPIC
Bilirubin Urine: NEGATIVE
Glucose, UA: NEGATIVE mg/dL
Hgb urine dipstick: NEGATIVE
Ketones, ur: NEGATIVE mg/dL
Leukocytes,Ua: NEGATIVE
Nitrite: NEGATIVE
Protein, ur: NEGATIVE mg/dL
Specific Gravity, Urine: 1.002 — ABNORMAL LOW (ref 1.005–1.030)
pH: 6 (ref 5.0–8.0)

## 2022-11-05 LAB — ETHANOL: Alcohol, Ethyl (B): <10 mg/dL

## 2022-11-05 LAB — APTT: aPTT: 29 s (ref 24–36)

## 2022-11-05 LAB — HCG, SERUM, QUALITATIVE: Preg, Serum: NEGATIVE

## 2022-11-05 MED ORDER — NICOTINE 21 MG/24HR TD PT24: 21.0000 mg | MEDICATED_PATCH | Freq: Once | TRANSDERMAL | Status: DC

## 2022-11-05 MED ORDER — SODIUM CHLORIDE 0.9 % IV BOLUS
1000.0000 mL | Freq: Once | INTRAVENOUS | Status: AC
  Administered 2022-11-05: 1000 mL via INTRAVENOUS

## 2022-11-05 MED ORDER — LORAZEPAM 1 MG PO TABS: 1.0000 mg | ORAL_TABLET | Freq: Once | ORAL | Status: DC

## 2022-11-05 NOTE — ED Notes (Signed)
Pt in CT at this time.

## 2022-11-05 NOTE — ED Provider Notes (Signed)
[place] EMERGENCY DEPARTMENT AT [facility] Provider Note   CSN: [id number] Arrival date & time: 11/05/22  0159     History  Chief Complaint  Patient presents with   Medication Reaction    [name] is a 42 y.o. female.  The history is provided by the spouse.   [name] is a 42 y.o. female who presents to the Emergency Department complaining of altered mental status.  She presents to the emergency department by EMS for evaluation following an episode of altered mental status.  Husband states that when he awoke from sleep he had been struck on the back.  He stated that his wife was staring blankly into space and had some twitching activity.  He states that this has been constant and EMS was called.  This started around midnight.  She did recently start on Zoloft on Friday and had her Celexa discontinued.  No reported recent illnesses.  She has been experiencing panic attacks for the last 2 weeks, which family report is her seeming and feeling jittery.  Previously she had severe panic attacks that resulted in screaming, crying.  No reported self-harm, injuries.  Patient denies headache, chest pain, fevers, nausea, vomiting, abdominal pain.      Home Medications Prior to Admission medications   Medication Sig Start Date End Date Taking? Authorizing Provider  albuterol (VENTOLIN HFA) 108 (90 Base) MCG/ACT inhaler Inhale 1-2 puffs into the lungs every 6 (six) hours as needed for wheezing or shortness of breath. 11/03/21   Tollie Eth, NP  betamethasone dipropionate 0.05 % cream Apply topically 2 (two) times daily. 10/10/22   Tollie Eth, NP  calcium citrate (CALCITRATE - DOSED IN MG ELEMENTAL CALCIUM) 950 (200 Ca) MG tablet Take 400 mg of elemental calcium by mouth daily. Patient not taking: Reported on 10/10/2022    [provider]  hydrOXYzine (ATARAX) 25 MG tablet Take 0.5-1 tablets (12.5-25 mg total) by mouth 3 (three) times daily as needed  for anxiety (nervousness). 10/10/22   Tollie Eth, NP  ibuprofen (ADVIL) 800 MG tablet TAKE 1 TABLET BY MOUTH 2 TIMES DAILY AS NEEDED. 11/03/22   Early, Sung Amabile, NP  medium chain triglycerides (MCT OIL) oil Take 15 mLs by mouth daily.    [provider]  nitrofurantoin, macrocrystal-monohydrate, (MACROBID) 100 MG capsule Take 1 capsule (100 mg total) by mouth 2 (two) times daily. 10/25/22   Tollie Eth, NP  omeprazole (PRILOSEC) 40 MG capsule TAKE 1 CAPSULE (40 MG TOTAL) BY MOUTH DAILY. Patient not taking: Reported on 10/10/2022 10/03/22   Early, Sung Amabile, NP  sertraline (ZOLOFT) 50 MG tablet Take 1 tablet (50 mg total) by mouth at bedtime. 11/03/22   Tollie Eth, NP      Allergies    Amoxicillin, Imitrex [sumatriptan base], Penicillins, Tramadol, Norco [hydrocodone-acetaminophen], Percocet [oxycodone-acetaminophen], and Soy allergy    Review of Systems   Review of Systems  All other systems reviewed and are negative.   Physical Exam Updated Vital Signs BP 126/77   Pulse 71   Temp 98.1 F (36.7 C) (Oral)   Resp 18   Ht 5\' 4"  (1.626 m)   Wt 87.5 kg   SpO2 97%   BMI 33.11 kg/m  Physical Exam Vitals and nursing note reviewed.  Constitutional:      Appearance: She is well-developed.  HENT:     Head: Normocephalic and atraumatic.  Cardiovascular:     Rate and Rhythm: Normal rate  and regular rhythm.     Heart sounds: No murmur heard. Pulmonary:     Effort: Pulmonary effort is normal. No respiratory distress.     Breath sounds: Normal breath sounds.  Abdominal:     Palpations: Abdomen is soft.     Tenderness: There is no abdominal tenderness. There is no guarding or rebound.  Musculoskeletal:        General: No tenderness.  Skin:    General: Skin is warm and dry.  Neurological:     Mental Status: She is alert and oriented to person, place, and time.     Comments: No asymmetry of facial movements.  She does not track but she does spontaneously look around the room.  She  moves all extremities very weakly  Psychiatric:     Comments: Poor eye contact.  Flat affect.  Minimally answers questions with quiet speech     ED Results / Procedures / Treatments   Labs (all labs ordered are listed, but only abnormal results are displayed) Labs Reviewed  COMPREHENSIVE METABOLIC PANEL - Abnormal; Notable for the following components:      Result Value   CO2 21 (*)    Glucose, Bld 119 (*)    BUN 5 (*)    Albumin 3.4 (*)    Total Bilirubin 0.2 (*)    All other components within normal limits  URINALYSIS, W/ REFLEX TO CULTURE (INFECTION SUSPECTED) - Abnormal; Notable for the following components:   Color, Urine STRAW (*)    Specific Gravity, Urine 1.003 (*)    Bacteria, UA RARE (*)    All other components within normal limits  CBC WITH DIFFERENTIAL/PLATELET  RAPID URINE DRUG SCREEN, HOSP PERFORMED  MAGNESIUM  HCG, SERUM, QUALITATIVE    EKG EKG Interpretation Date/Time:  Sunday November 05 2022 02:21:50 EDT Ventricular Rate:  63 PR Interval:  175 QRS Duration:  90 QT Interval:  408 QTC Calculation: 418 R Axis:   21  Text Interpretation: Sinus rhythm Probable anterior infarct, old Confirmed by Tilden Fossa [id number]) on 11/05/2022 4:27:35 AM  Radiology CT Head Wo Contrast  Result Date: 11/05/2022 CLINICAL DATA:  Mental status change EXAM: CT HEAD WITHOUT CONTRAST TECHNIQUE: Contiguous axial images were obtained from the base of the skull through the vertex without intravenous contrast. RADIATION DOSE REDUCTION: This exam was performed according to the departmental dose-optimization program which includes automated exposure control, adjustment of the mA and/or kV according to patient size and/or use of iterative reconstruction technique. COMPARISON:  12/18/2015 FINDINGS: Brain: No evidence of acute infarction, hemorrhage, mass, mass effect, or midline shift. No hydrocephalus or extra-axial fluid collection. Gray-white differentiation is preserved. Vascular: No  hyperdense vessel. Skull: Negative for fracture or focal lesion. Prior suboccipital craniectomy for Chiari malformation. Sinuses/Orbits: No acute finding. Other: The mastoid air cells are well aerated. IMPRESSION: No acute intracranial process. Electronically Signed   By: Wiliam Ke M.D.   On: 11/05/2022 03:19    Procedures Procedures    Medications Ordered in ED Medications - No data to display  ED Course/ Medical Decision Making/ A&P                             Medical Decision Making Amount and/or Complexity of Data Reviewed Labs: ordered. Radiology: ordered.   Patient with history of severe anxiety here for evaluation of episodes at home where she stared into space blankly and was less interactive.  She did have fluttering eye  movements per family.  None of these were witnessed in the emergency department.  No video available of the event.  On initial examination there was poor effort but she was interactive.  On repeat evaluation she reported feeling confused but she was awake, alert and moved everything strongly and symmetrically.  CT head is negative for acute abnormality.  Labs are reassuring.  No evidence of UTI or significant electrolyte abnormality.  Current clinical picture is not consistent with sepsis or meningitis.  Suspect these events are more stress-induced over her.  During her ED stay she did have an unwitnessed event by staff but witnessed by family.  They stated that she had eye fluttering.  On assessment in the room patient stated she felt confused but did not appear postictal.  Feel she is stable for discharge home with outpatient follow-up.  Current clinical picture is not consistent with status.  These events are most likely stress-induced but will refer to neurology to rule out epileptic events.  Discussed importance of outpatient follow-up as well as return precautions.       Final Clinical Impression(s) / ED Diagnoses Final diagnoses:  Spell of abnormal  behavior    Rx / DC Orders ED Discharge Orders          Ordered    Ambulatory referral to Neurology       Comments: An appointment is requested in approximately: 2 weeks Re seizure like activity   11/05/22 0503              Tilden Fossa, MD 11/05/22 0740

## 2022-11-05 NOTE — ED Triage Notes (Signed)
Here yesterday for seizure like activity. Called 911 for stroke like symptoms. Left sided weakness and left facial droop. Witnessed seizure episode with EMS. 2.5mg  Versed IVP given en route. EMS reports periods of apnea with O2 nonrebreather on arrival.

## 2022-11-05 NOTE — ED Triage Notes (Signed)
Pt via EMS for c/o of possible med reaction. Report husband called EMS d/t pt posturing in bed and tachypnic. Pt noted staring into distance on arrival, EMS deny pt having any seizure like activity. Pt able to state name. Pt recently started on Sertraline, concerned she may have adverse reaction to it. Report every 5 mins or so she will tense up. Follows commands.

## 2022-11-05 NOTE — Discharge Instructions (Addendum)
You are seen today in the emergency department for abnormal movements.  While you are here we performed a physical exam, monitoring of vital signs, and checked your labs.  These were all reassuring.  Please stop taking the Zoloft, and restart your medication that you were on previously and stable for several years.  Keep your upcoming appointment with your primary care provider.  Additionally, return to the emergency department if you have any new or worsening concerns.

## 2022-11-05 NOTE — ED Provider Notes (Signed)
[place] EMERGENCY DEPARTMENT AT [facility] Provider Note  MDM   HPI/ROS:  [name] is a 42 y.o. female with a medical history as below who presents with complaint of seizure like activity, and left-sided weakness.  EMS was called out for left-sided weakness.  While EMS was there she had witnessed seizure-like activity where she became very rigid.  She was given 2.5 of Versed IV.  Remained hemodynamically stable.  Patient denied any headache, chest pain, fever, nausea, vomiting or abdominal pain.  Physical exam is notable for:  - Rigid, abnormal body movement during which patient was responsive to pain - Fluttering eye movements  On my initial evaluation, patient is:  -Vital signs stable. Patient afebrile, hemodynamically stable, and non-toxic appearing. -Additional history obtained from chart review  This patient's current presentation, including their history and physical exam, is most consistent with nonepileptic seizure. Differentials include epileptic seizure, less likely.  Will plan for broad labs, no need for imaging that was considered.  She did undergo a head CT yesterday that was normal and is less than 24 hours old.   Interpretations, interventions, and the patient's course of care are documented below.    Clinical Course as of 11/05/22 2336  Wynelle Link Nov 05, 2022  2104 Patient reevaluated, she is at her baseline.  Discussed everything that is going on and she is agreeable to go. [BB]    Clinical Course User Index [BB] Fayrene Helper, MD    On reassessment, labs are reassuring.  There is no evidence of UTI or any significant electrolyte abnormality.  I do not believe that she has any evidence of sepsis or meningitis.  Suspect that this is most likely stress-induced nonepileptic seizure versus panic attack.  She has not appeared postictal.  She reports that she maintained awareness during all of the events.  Workup ultimately was reassuring.  On reassessment,  she has no neurologic deficit.    Reassured that she already has a referral to neurology in place and she has close follow-up with her primary care tomorrow.  I did instruct her to stop taking the Zoloft and restart her old medication, she has been stable on that for years and she was unclear why she had stopped it to start Zoloft instead.  Believes that this may be related to the Zoloft.  Certainly something she can follow-up with her primary care doctor about tomorrow.  Disposition:  I discussed the plan for discharge with the patient and/or their surrogate at bedside prior to discharge and they were in agreement with the plan and verbalized understanding of the return precautions provided. All questions answered to the best of my ability. Ultimately, the patient was discharged in stable condition with stable vital signs. I am reassured that they are capable of close follow up and good social support at home.   Clinical Impression:  1. Seizure-like activity (HCC)     Rx / DC Orders ED Discharge Orders     None       The plan for this patient was discussed with Dr. Silverio Lay, who voiced agreement and who oversaw evaluation and treatment of this patient.   Clinical Complexity A medically appropriate history, review of systems, and physical exam was performed.  My independent interpretations of EKG, labs, and radiology are documented in the ED course above.   If decision rules were used in this patient's evaluation, they are listed below.   Click here for ABCD2, HEART and other calculatorsREFRESH Note before signing  Patient's presentation is most consistent with acute presentation with potential threat to life or bodily function.  Medical Decision Making Amount and/or Complexity of Data Reviewed Labs: ordered.    HPI/ROS      See MDM section for pertinent HPI and ROS. A complete ROS was performed with pertinent positives/negatives noted above.   Past Medical History:   Diagnosis Date   Anemia    h/o   Anginal pain (HCC)    pt. takes nitrostat- as needed, hasn't had since 04/2012, sees Dr. 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 2013   Neuromuscular disorder (HCC)    muscle spasms-back & arms    SVT (supraventricular tachycardia) 01/22/2012   UTI (lower urinary tract infection) 01/20/2012    Past Surgical History:  Procedure Laterality Date   ABDOMINAL HYSTERECTOMY     PERIPHERALLY INSERTED CENTRAL CATHETER INSERTION     during event of Meningitis- then d/c'd to home /w & treated /w antibiotics    SUBOCCIPITAL CRANIECTOMY CERVICAL LAMINECTOMY N/A 07/22/2012   Procedure: SUBOCCIPITAL CRANIECTOMY CERVICAL LAMINECTOMY/DURAPLASTY;  Surgeon: Hewitt Shorts, MD;  Location: [facility];  Service: Neurosurgery;  Laterality: N/A;  Suboccipital craniectomy with upper cervial laminectomy with duroplasty    TUBAL LIGATION     VAGINAL DELIVERY     x3   WISDOM TOOTH EXTRACTION        Physical Exam   Vitals:   11/05/22 1858 11/05/22 1859 11/05/22 1930 11/05/22 2000  BP:   (!) 116/57 124/76  Pulse: 66 63 86 67  Resp: 17 16 (!) 24 15  Temp:      TempSrc:      SpO2: 99% 99% 100% 100%  Weight:      Height:        Physical Exam Vitals and nursing note reviewed.  Constitutional:      General: She is not in acute distress.    Appearance: She is well-developed.  HENT:     Head: Normocephalic and atraumatic.  Eyes:     Conjunctiva/sclera: Conjunctivae normal.  Cardiovascular:     Rate and Rhythm: Normal rate and regular rhythm.     Heart sounds: No murmur heard. Pulmonary:     Effort: Pulmonary effort is normal. No respiratory distress.      Breath sounds: Normal breath sounds.  Abdominal:     Palpations: Abdomen is soft.     Tenderness: There is no abdominal tenderness.  Musculoskeletal:        General: No swelling.     Cervical back: Neck supple.  Skin:    General: Skin is warm and dry.     Capillary Refill: Capillary refill takes less than 2 seconds.  Neurological:     Mental Status: She is alert.     GCS: GCS eye subscore is 4. GCS verbal subscore is 4. GCS motor subscore is 4.     Cranial Nerves: No facial asymmetry.     Sensory: No sensory deficit.     Motor: Abnormal muscle tone and seizure activity (Appears to be nonepileptic) present. No weakness.      Procedures   If procedures were preformed  on this patient, they are listed below:  Procedures   Fayrene Helper, MD Emergency Medicine PGY-2   Please note that this documentation was produced with the assistance of voice-to-text technology and may contain errors.    Fayrene Helper, MD 11/05/22 2336    Charlynne Pander, MD 11/07/22 [phone]

## 2022-11-05 NOTE — ED Notes (Signed)
Labs sent 0225.

## 2022-11-06 ENCOUNTER — Encounter: Payer: Self-pay | Admitting: Family Medicine

## 2022-11-06 ENCOUNTER — Ambulatory Visit (INDEPENDENT_AMBULATORY_CARE_PROVIDER_SITE_OTHER): Payer: Medicare Other | Admitting: Family Medicine

## 2022-11-06 VITALS — BP 138/98 | HR 104 | Temp 99.8°F | Ht 64.0 in | Wt 191.2 lb

## 2022-11-06 DIAGNOSIS — F411 Generalized anxiety disorder: Secondary | ICD-10-CM

## 2022-11-06 DIAGNOSIS — F4001 Agoraphobia with panic disorder: Secondary | ICD-10-CM

## 2022-11-06 MED ORDER — BUSPIRONE HCL 15 MG PO TABS: ORAL_TABLET | ORAL | 0 refills | Status: DC

## 2022-11-06 MED ORDER — ALPRAZOLAM 0.25 MG PO TABS
0.2500 mg | ORAL_TABLET | Freq: Three times a day (TID) | ORAL | 0 refills | Status: AC | PRN
Start: 2022-11-06 — End: ?

## 2022-11-06 NOTE — Patient Instructions (Signed)
  Stay off of the sertraline (zoloft). Continue on the 40 mg of citalopram (celexa). Start taking buspar 5 mg (1/3 of a tablet) twice daily for a few days, and then, if tolerated, increase to 7.5 mg (1/2 tablet) twice daily. Stay at that dose, and return to see SaraBeth in 2-3 weeks. If you have a panic attack (heart racing, chest pain, shaking, unable to catch your breath, feeling like you're dying)--you can take an alprazolam. This should be used very sparingly (not often)--but when you feel very badly, you can try this prior to your family calling EMS. Remember your breathing techniques (breathe the box), and to try these when you first feel anxious and bad. If you take the alprazolam, be sure not to drive. Don't take other sedating medications with it, or have any alcohol.  Please try and get in with the psychiatrist as soon as you can.  Someone from Neurology should be contacting you (the ER put in a referral).  You can back off on how much water you are drinking.  Your urine is very dilute (you are drinking a lot!), and that is contributing to why you are using the bathroom so often.  I reviewed your bloodwork, urine results and CT scan results from your recent ER visits, and everything look okay.

## 2022-11-06 NOTE — Progress Notes (Signed)
Chief Complaint  Patient presents with   Hospitalization Follow-up    Patient is here for hospital follow up for seizures. Also has been urinating a lot, frequency.     She was seen in the ER twice over the weekend.  Once in the early morning hours of 7/14, and again last evening. She is here for ER follow-up. She reports voiding very frequently, but endorses drinking a lot of water.  She has had some vaginal irritation, treated for folliculitis by PCP last month. She reports this is improving, still has some bumps she would like looked at.  First ER visit was for an episode of altered mental status. She reportedly was staring blankly into space, some twitching activity. Fluttering eye movements had been noted by the family (none witnessed in ER). Recently started on zoloft (Friday, 50mg ), in place of celexa (40mg ), for worsening anxiety. She had labs and head CT done (see below).  She went to ER again last evening, with seizure-like activity and L sided weakness.  Given versed by EMS. In ER, noted to have rigid, abnormal body movement (and responsive to pain), fluttering eye movements. She was felt to have non-eplieptic seizure. They felt it was stress-induced nonepileptic seizure, vs panic attack. No postictal activity, and she reportedly stated she maintained awareness during these events. She had normal neurologic exam in ER. They put in a referral to neurology for further evaluation.  She was told to stop zoloft, and restart her prior med (citlaopram)  Labs--first visit Neg HCG, urine SG 1.003, normal Urine drug screen normal. Chem--CO2 21, o/w normal, normal CBC Normal Mg  Head CT: IMPRESSION: No acute intracranial process.  Labs--2nd visit--glu 106, rest of chem normal, neg alcohol, normal INR, PTT, CBC Urine + benzodiazepines U/a SG 1.002, normal  She saw PCP 6/18 (note not yet completed). It looks like the medication change wasn't made until MyChart message on 7/12. She  started the sertraline (full 50 mg tablet) on Saturday, 7/13. She took only 2 doses. She stopped the Celexa on Friday.  She has been feeling very nervous, jittery for over a month. Trying to get in with a psychiatrist.  PMH, PSH, SH reviewed  Outpatient Encounter Medications as of 11/06/2022  Medication Sig Note   albuterol (VENTOLIN HFA) 108 (90 Base) MCG/ACT inhaler Inhale 1-2 puffs into the lungs every 6 (six) hours as needed for wheezing or shortness of breath. 11/06/2022: Last used a few days ago   aspirin-acetaminophen-caffeine (EXCEDRIN MIGRAINE) 250-250-65 MG tablet Take 2 tablets by mouth every 6 (six) hours as needed for headache. 11/06/2022: Took 2 today   calcium citrate (CALCITRATE - DOSED IN MG ELEMENTAL CALCIUM) 950 (200 Ca) MG tablet Take 400 mg of elemental calcium by mouth daily.    citalopram (CELEXA) 40 MG tablet Take 40 mg by mouth daily. 11/06/2022: Plans to take tonight.  Hasn't taken since Friday evening   ibuprofen (ADVIL) 800 MG tablet TAKE 1 TABLET BY MOUTH 2 TIMES DAILY AS NEEDED. 11/06/2022: Last dose yesterday   betamethasone dipropionate 0.05 % cream Apply topically 2 (two) times daily. (Patient not taking: Reported on 11/06/2022) 11/06/2022: Not using    hydrOXYzine (ATARAX) 25 MG tablet Take 0.5-1 tablets (12.5-25 mg total) by mouth 3 (three) times daily as needed for anxiety (nervousness). (Patient not taking: Reported on 11/06/2022) 11/06/2022: stopped   omeprazole (PRILOSEC) 40 MG capsule TAKE 1 CAPSULE (40 MG TOTAL) BY MOUTH DAILY. (Patient not taking: Reported on 10/10/2022) 11/06/2022: As needed   sertraline (  ZOLOFT) 50 MG tablet Take 1 tablet (50 mg total) by mouth at bedtime. (Patient not taking: Reported on 11/06/2022) 11/06/2022: Only took 2 doses.  Didn't take any yesterday or today   [DISCONTINUED] medium chain triglycerides (MCT OIL) oil Take 15 mLs by mouth daily. 10/10/2022: One tablespoon daily in coffee   [DISCONTINUED] nitrofurantoin, macrocrystal-monohydrate,  (MACROBID) 100 MG capsule Take 1 capsule (100 mg total) by mouth 2 (two) times daily. (Patient not taking: Reported on 11/06/2022) 11/06/2022: finished   No facility-administered encounter medications on file as of 11/06/2022.     Allergies  Allergen Reactions   Amoxicillin Anaphylaxis and Hives    Has patient had a PCN reaction causing immediate rash, facial/tongue/throat swelling, SOB or lightheadedness with hypotension: Yes Has patient had a PCN reaction causing severe rash involving mucus membranes or skin necrosis: Yes Has patient had a PCN reaction that required hospitalization No Has patient had a PCN reaction occurring within the last 10 years: No If all of the above answers are "NO", then may proceed with Cephalosporin use.    Imitrex [Sumatriptan Base] Anaphylaxis and Hives   Penicillins Anaphylaxis and Hives    Has patient had a PCN reaction causing immediate rash, facial/tongue/throat swelling, SOB or lightheadedness with hypotension: Yes Has patient had a PCN reaction causing severe rash involving mucus membranes or skin necrosis: Yes Has patient had a PCN reaction that required hospitalization No Has patient had a PCN reaction occurring within the last 10 years: No If all of the above answers are "NO", then may proceed with Cephalosporin use.    Tramadol Shortness Of Breath and Palpitations   Norco [Hydrocodone-Acetaminophen] Other (See Comments)    Hallucinations    Percocet [Oxycodone-Acetaminophen] Other (See Comments)    Hallucinations    Soy Allergy Hives   ROS: No f/c, URI symptoms. Slight headache, thinks it is due to not eating as much. Currently denies chest pain or shortness of breath (just when feeling panicky). No n/v/d. +urinary frequency, no dysuria. Had some external irritation; improving as folliculitis is improving    PHYSICAL EXAM:  BP (!) 138/98   Pulse (!) 104   Temp 99.8 F (37.7 C) (Tympanic)   Ht 5\' 4"  (1.626 m)   Wt 191 lb 3.2 oz (86.7  kg)   BMI 32.82 kg/m   Anxious female, visibly shaking initially. Throughout the visit, she became notably calmer. Still anxious, asking a lot of questions, but physically appeared calmer. Tachycardia resolved, seemed calmer. HEENT: conjunctiva and sclera are clear, EOMI. OP clear Neck: no lymphadenopathy or mass Heart: regular rate and rhythm, no longer tachycardic Lungs: clear bilaterally Neuro: alert and oriented. Cranial nerves intact. Normal strength, sensation, gait. DTR's 2+ and symmetric GU: There were a few papules along the medial aspect of labia bilaterally, and superiorly No pustules or erythema. Extremities: no edema Abdomen: soft, nontender Back: no spinal or CVA tenderness     ASSESSMENT/PLAN:  Generalized anxiety disorder - suboptimally controlled; awaiting appt with psych. Stop zoloft, resume citalopram. Add Buspar. Xanax sparingly, risks/SE reviewed - Plan: busPIRone (BUSPAR) 15 MG tablet  Panic disorder with agoraphobia - risks/SE of xanax reviewed, to use sparingly when other methods failed (reviewed some grounding techniques, breathe the box) - Plan: ALPRAZolam (XANAX) 0.25 MG tablet  Reassured that there was no e/o UTI on samples in ER. Urine was very dilute--she can cut back on drinking water, which should help her urinary frequency.  Serum Na was normal.  Start Buspar (5mg  BID for a  few days, then increase to 7.5mg  BID if tolerated). F'u in 2-3 weeks with PCP.  I spent 45 minutes dedicated to the care of this patient, including pre-visit review of records, face to face time, post-visit ordering of testing and documentation.  Stay off of the sertraline (zoloft). Continue on the 40 mg of citalopram (celexa). Start taking buspar 5 mg (1/3 of a tablet) twice daily for a few days, and then, if tolerated, increase to 7.5 mg (1/2 tablet) twice daily. Stay at that dose, and return to see SaraBeth in 2-3 weeks. If you have a panic attack (heart racing, chest pain,  shaking, unable to catch your breath, feeling like you're dying)--you can take an alprazolam. This should be used very sparingly (not often)--but when you feel very badly, you can try this prior to your family calling EMS. Remember your breathing techniques (breathe the box), and to try these when you first feel anxious and bad. If you take the alprazolam, be sure not to drive. Don't take other sedating medications with it, or have any alcohol.  Please try and get in with the psychiatrist as soon as you can.  Someone from Neurology should be contacting you (the ER put in a referral).  You can back off on how much water you are drinking.  Your urine is very dilute (you are drinking a lot!), and that is contributing to why you are using the bathroom so often.  I reviewed your bloodwork, urine results and CT scan results from your recent ER visits, and everything look okay.

## 2022-11-13 ENCOUNTER — Encounter: Payer: Self-pay | Admitting: Family Medicine

## 2022-11-14 ENCOUNTER — Other Ambulatory Visit: Payer: Self-pay | Admitting: Medical

## 2022-11-14 MED ORDER — BUSPIRONE HCL 5 MG PO TABS: 5.0000 mg | ORAL_TABLET | Freq: Two times a day (BID) | ORAL | 0 refills | Status: DC

## 2022-11-20 DIAGNOSIS — L739 Follicular disorder, unspecified: Secondary | ICD-10-CM | POA: Insufficient documentation

## 2022-11-20 NOTE — Assessment & Plan Note (Signed)
Patient reports an area of skin irritation and inflammation, likely related to folliculitis or an ingrown hair. Evaluation is consistent with this finding. At this time there are no signs of infection present, which is reassuring. We will initiate topical treatment to help manage symptoms.  Plan:   - Prescribe topical cream for itching and inflammation (apply twice daily) - Instruct the patient to monitor the affected area and report any worsening or lack of improvement

## 2022-11-20 NOTE — Assessment & Plan Note (Signed)
Patient reports ongoing struggles with anxiety and panic attacks. Current medication is citalopram 40 mg daily, which has provided some relief but breakthrough symptoms persist. She reports taking medications consistent with bipolar treatment in the past, which makes me speculate if this was a diagnosis. At this time she is awaiting psychiatry evaluation, therefore, we will not begin any medications for bipolar at this time. We discussed additional management strategies today.  Plan: - Continue citalopram 40 mg daily - Prescribe hydroxyzine for as-needed use during panic attacks (up to 3 times per day)   - Encourage the use of progressive muscle relaxation techniques - Recommend regular exercise, such as going to the gym, to help manage anxiety - Monitor response to treatment and adjust as needed

## 2022-11-20 NOTE — Assessment & Plan Note (Signed)
>>  ASSESSMENT AND PLAN FOR GENERALIZED ANXIETY DISORDER WRITTEN ON 11/20/2022  1:53 AM BY Reily Ilic E, NP  Patient reports ongoing struggles with anxiety and panic attacks. Current medication is citalopram  40 mg daily, which has provided some relief but breakthrough symptoms persist. She reports taking medications consistent with bipolar treatment in the past, which makes me speculate if this was a diagnosis. At this time she is awaiting psychiatry evaluation, therefore, we will not begin any medications for bipolar at this time. We discussed additional management strategies today.  Plan: - Continue citalopram  40 mg daily - Prescribe hydroxyzine  for as-needed use during panic attacks (up to 3 times per day)   - Encourage the use of progressive muscle relaxation techniques - Recommend regular exercise, such as going to the gym, to help manage anxiety - Monitor response to treatment and adjust as needed

## 2022-11-25 ENCOUNTER — Other Ambulatory Visit: Payer: Self-pay | Admitting: Nurse Practitioner

## 2022-11-25 DIAGNOSIS — F422 Mixed obsessional thoughts and acts: Secondary | ICD-10-CM

## 2022-11-25 DIAGNOSIS — F339 Major depressive disorder, recurrent, unspecified: Secondary | ICD-10-CM

## 2022-11-25 DIAGNOSIS — F411 Generalized anxiety disorder: Secondary | ICD-10-CM

## 2022-11-25 NOTE — Progress Notes (Signed)
PT is part of stroke order set

## 2022-11-26 ENCOUNTER — Other Ambulatory Visit: Payer: Self-pay | Admitting: Medical

## 2022-11-27 NOTE — Telephone Encounter (Signed)
Asking for 90 day supply instead of 30.

## 2022-11-29 ENCOUNTER — Other Ambulatory Visit: Payer: Self-pay | Admitting: Family Medicine

## 2022-11-29 DIAGNOSIS — F411 Generalized anxiety disorder: Secondary | ICD-10-CM

## 2022-12-22 ENCOUNTER — Ambulatory Visit (HOSPITAL_COMMUNITY)
Admission: EM | Admit: 2022-12-22 | Discharge: 2022-12-22 | Disposition: A | Discharge status: Home / Self Care | Payer: Medicare Other | Source: Home / Self Care | Attending: Internal Medicine | Admitting: Internal Medicine

## 2022-12-22 ENCOUNTER — Encounter (HOSPITAL_COMMUNITY): Payer: Self-pay

## 2022-12-22 DIAGNOSIS — J069 Acute upper respiratory infection, unspecified: Secondary | ICD-10-CM

## 2022-12-22 DIAGNOSIS — U071 COVID-19: Secondary | ICD-10-CM | POA: Insufficient documentation

## 2022-12-22 DIAGNOSIS — J4520 Mild intermittent asthma, uncomplicated: Secondary | ICD-10-CM | POA: Diagnosis not present

## 2022-12-22 DIAGNOSIS — R059 Cough, unspecified: Secondary | ICD-10-CM | POA: Diagnosis present

## 2022-12-22 LAB — SARS CORONAVIRUS 2 (TAT 6-24 HRS): SARS Coronavirus 2: POSITIVE — AB

## 2022-12-22 MED ORDER — BENZONATATE 100 MG PO CAPS: 100.0000 mg | ORAL_CAPSULE | Freq: Three times a day (TID) | ORAL | 0 refills | Status: DC

## 2022-12-22 MED ORDER — GUAIFENESIN ER 1200 MG PO TB12: 1200.0000 mg | ORAL_TABLET | Freq: Two times a day (BID) | ORAL | 0 refills | Status: DC

## 2022-12-22 NOTE — Discharge Instructions (Addendum)
 Your symptoms are most likely due to a viral illness, which will improve on its own with rest and fluids.  - Take prescribed medicines to help with symptoms: tessalon perles - Use over the counter medicines to help with symptoms as discussed: Ibuprofen, tylenol, guaifenesin (mucinex), zyrtec, etc - Two teaspoons of honey in warm water every 4-6 hours may help with throat pains - Humidifier in your room at night to help add water the air and soothe cough  If you develop any new or worsening symptoms or do not improve in the next 2 to 3 days, please return.  If your symptoms are severe, please go to the emergency room.  Follow-up with PCP as needed.

## 2022-12-22 NOTE — ED Provider Notes (Signed)
[facility]    CSN: [id number] Arrival date & time: 12/22/22  1013      History   Chief Complaint Chief Complaint  Patient presents with   Sore Throat   Cough   Headache    HPI [name] is a 42 y.o. female.   Patient presents to urgent care for evaluation of fever, chills, body aches, fatigue, sore throat, cough, and stuffy nose that started 2 days ago on Wednesday December 20, 2022. Cough is dry and nonproductive. Reports headache at onset of symptoms but this has improved since.  Unsure of max temp at home as she has not taken her temperature with a thermometer but reports significant cold chills.  No nausea, vomiting, diarrhea, abdominal pain, rash, or dizziness.  History of asthma, using albuterol inhaler 1-2 times a day as needed for intermittent shortness of breath with relief since symptoms started.  No leg swelling or orthopnea reported.  Denies chest pain and heart palpitations.  Her husband is sick with similar symptoms.  She has not taken a COVID-19 test at home.  Never smoker.  She has tried taking Sudafed and ibuprofen, this is helping slightly with headache and nasal congestion.  She is also taking Benadryl intermittently as well as Robitussin.   Sore Throat Associated symptoms include headaches.  Cough Associated symptoms: headaches   Headache Associated symptoms: cough     Past Medical History:  Diagnosis Date   Anemia    h/o   Anginal pain (HCC)    pt. takes nitrostat- as needed, hasn't had since 04/2012, sees Dr. 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   Vision changes 12/11/2021   Generalized anxiety disorder 12/11/2021   Pre-diabetes 12/11/2021   Mild intermittent asthma without complication 12/11/2021   Intractable episodic headache 12/11/2021   Depression, recurrent (HCC) 12/11/2021   Vitamin D deficiency 12/11/2021   Major depressive disorder, 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   Arnold-Chiari malformation, 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    Past Surgical History:  Procedure Laterality Date   ABDOMINAL HYSTERECTOMY     PERIPHERALLY INSERTED CENTRAL CATHETER INSERTION     during event of Meningitis- then d/c'd to home /w & treated /w antibiotics    SUBOCCIPITAL CRANIECTOMY CERVICAL LAMINECTOMY N/A 07/22/2012   Procedure: SUBOCCIPITAL CRANIECTOMY CERVICAL LAMINECTOMY/DURAPLASTY;  Surgeon: Hewitt Shorts, MD;  Location: [facility];  Service: Neurosurgery;  Laterality: N/A;  Suboccipital craniectomy with upper cervial laminectomy with duroplasty    TUBAL LIGATION     VAGINAL DELIVERY     x3   WISDOM TOOTH EXTRACTION      OB History   No obstetric history on file.      Home Medications  Prior to Admission medications   Medication Sig Start Date End Date Taking? Authorizing Provider  ALPRAZolam (XANAX) 0.25 MG tablet Take 1-2 tablets (0.25-0.5 mg total) by mouth 3 (three) times daily as needed for anxiety. 11/06/22  Yes Joselyn Arrow, MD  aspirin-acetaminophen-caffeine (EXCEDRIN MIGRAINE) [phone] MG tablet Take 2 tablets by mouth every 6 (six) hours as needed for headache.   Yes [provider]  benzonatate (TESSALON) 100 MG  capsule Take 1 capsule (100 mg total) by mouth every 8 (eight) hours. 12/22/22  Yes Carlisle Beers, FNP  busPIRone (BUSPAR) 15 MG tablet Take 1/3 tablet twice daily for a few days, then increase to 1/2 tablet twice daily. Titrate up further as directed by PCP 11/06/22  Yes Joselyn Arrow, MD  busPIRone (BUSPAR) 5 MG tablet TAKE 1 TABLET BY MOUTH TWICE A DAY 11/27/22  Yes Early, Sung Amabile, NP  citalopram (CELEXA) 40 MG tablet Take 40 mg by mouth daily.   Yes [provider]  Guaifenesin 1200 MG TB12 Take 1 tablet (1,200 mg total) by mouth in the morning and at bedtime. 12/22/22  Yes Carlisle Beers, FNP  hydrOXYzine (ATARAX) 25 MG tablet Take 0.5-1 tablets (12.5-25 mg total) by mouth 3 (three) times daily as needed for anxiety (nervousness). 10/10/22  Yes Early, Sung Amabile, NP  ibuprofen (ADVIL) 800 MG tablet TAKE 1 TABLET BY MOUTH 2 TIMES DAILY AS NEEDED. 11/03/22  Yes Early, Sung Amabile, NP  sertraline (ZOLOFT) 50 MG tablet TAKE 1 TABLET BY MOUTH EVERYDAY AT BEDTIME 11/29/22  Yes Early, Sung Amabile, NP  albuterol (VENTOLIN HFA) 108 (90 Base) MCG/ACT inhaler Inhale 1-2 puffs into the lungs every 6 (six) hours as needed for wheezing or shortness of breath. 11/03/21   Tollie Eth, NP  betamethasone dipropionate 0.05 % cream Apply topically 2 (two) times daily. Patient not taking: Reported on 11/06/2022 10/10/22   Early, Sung Amabile, NP  calcium citrate (CALCITRATE - DOSED IN MG ELEMENTAL CALCIUM) 950 (200 Ca) MG tablet Take 400 mg of elemental calcium by mouth daily.    [provider]  omeprazole (PRILOSEC) 40 MG capsule TAKE 1 CAPSULE (40 MG TOTAL) BY MOUTH DAILY. Patient not taking: Reported on 10/10/2022 10/03/22   Early, Sung Amabile, NP    Family History Family History  Problem Relation Age of Onset   Heart attack Mother    Healthy Father     Social History Social History   Tobacco Use   Smoking status: Never    Passive exposure: Never   Smokeless tobacco: Never  Vaping Use   Vaping status:  Never Used  Substance Use Topics   Alcohol use: No   Drug use: No     Allergies   Amoxicillin, Imitrex [sumatriptan base], Penicillins, Tramadol, Norco [hydrocodone-acetaminophen], Percocet [oxycodone-acetaminophen], and Soy allergy   Review of Systems Review of Systems  Respiratory:  Positive for cough.   Neurological:  Positive for headaches.  Per HPI   Physical Exam Triage Vital Signs ED Triage Vitals [12/22/22 1036]  Encounter Vitals Group     BP 118/78     Systolic BP Percentile      Diastolic BP Percentile      Pulse Rate (!) 113     Resp 16     Temp 98.3 F (36.8 C)     Temp Source Oral     SpO2 97 %     Weight      Height      Head Circumference  Peak Flow      Pain Score      Pain Loc      Pain Education      Exclude from Growth Chart    No data found.  Updated Vital Signs BP 118/78 (BP Location: Left Arm)   Pulse (!) 113   Temp 98.3 F (36.8 C) (Oral)   Resp 16   SpO2 97%   Visual Acuity Right Eye Distance:   Left Eye Distance:   Bilateral Distance:    Right Eye Near:   Left Eye Near:    Bilateral Near:     Physical Exam Vitals and nursing note reviewed.  Constitutional:      Appearance: She is not ill-appearing or toxic-appearing.  HENT:     Head: Normocephalic and atraumatic.     Right Ear: Hearing, tympanic membrane, ear canal and external ear normal.     Left Ear: Hearing, tympanic membrane, ear canal and external ear normal.     Nose: Nose normal.     Mouth/Throat:     Lips: Pink.     Mouth: Mucous membranes are moist. No injury.     Tongue: No lesions. Tongue does not deviate from midline.     Palate: No mass and lesions.     Pharynx: Oropharynx is clear. Uvula midline. Posterior oropharyngeal erythema present. No pharyngeal swelling, oropharyngeal exudate or uvula swelling.     Tonsils: No tonsillar exudate or tonsillar abscesses.     Comments: Mild erythema to the posterior oropharynx with evidence of clear postnasal  drainage. Eyes:     General: Lids are normal. Vision grossly intact. Gaze aligned appropriately.     Extraocular Movements: Extraocular movements intact.     Conjunctiva/sclera: Conjunctivae normal.  Cardiovascular:     Rate and Rhythm: Normal rate and regular rhythm.     Heart sounds: Normal heart sounds, S1 normal and S2 normal.  Pulmonary:     Effort: Pulmonary effort is normal. No respiratory distress.     Breath sounds: Normal breath sounds and air entry.  Musculoskeletal:     Cervical back: Neck supple.  Skin:    General: Skin is warm and dry.     Capillary Refill: Capillary refill takes less than 2 seconds.     Findings: No rash.  Neurological:     General: No focal deficit present.     Mental Status: She is alert and oriented to person, place, and time. Mental status is at baseline.     Cranial Nerves: No dysarthria or facial asymmetry.  Psychiatric:        Mood and Affect: Mood normal.        Speech: Speech normal.        Behavior: Behavior normal.        Thought Content: Thought content normal.        Judgment: Judgment normal.      UC Treatments / Results  Labs (all labs ordered are listed, but only abnormal results are displayed) Labs Reviewed  SARS CORONAVIRUS 2 (TAT 6-24 HRS)    EKG   Radiology No results found.  Procedures Procedures (including critical care time)  Medications Ordered in UC Medications - No data to display  Initial Impression / Assessment and Plan / UC Course  I have reviewed the triage vital signs and the nursing notes.  Pertinent labs & imaging results that were available during my care of the patient were reviewed by me and considered in my medical decision making (  see chart for details).   1.  Viral URI with cough, mild intermittent asthma without complication Evaluation suggests viral URI etiology. Will manage this with recommendations for OTC and prescription medications for symptomatic relief. Encouraged to push fluids to  stay well hydrated.  Imaging: deferred based on stable cardiopulmonary exam/hemodynamically stable vital signs Prescriptions sent for further symptomatic relief, may continue using OTC medications as needed. Strep/Viral testing: COVID testing is pending, patient may have paxlovid if positive and within the 5 day window of symptoms. Most recent GFR >60 on 11/05/22.   Counseled patient on potential for adverse effects with medications prescribed/recommended today, strict ER and return-to-clinic precautions discussed, patient verbalized understanding.    Final Clinical Impressions(s) / UC Diagnoses   Final diagnoses:  Viral URI with cough  Mild intermittent asthma without complication     Discharge Instructions      Your symptoms are most likely due to a viral illness, which will improve on its own with rest and fluids.  - Take prescribed medicines to help with symptoms: tessalon perles - Use over the counter medicines to help with symptoms as discussed: Ibuprofen, tylenol, guaifenesin (mucinex), zyrtec, etc - Two teaspoons of honey in warm water every 4-6 hours may help with throat pains - Humidifier in your room at night to help add water the air and soothe cough  If you develop any new or worsening symptoms or do not improve in the next 2 to 3 days, please return.  If your symptoms are severe, please go to the emergency room.  Follow-up with PCP as needed.     ED Prescriptions     Medication Sig Dispense Auth. Provider   Guaifenesin 1200 MG TB12 Take 1 tablet (1,200 mg total) by mouth in the morning and at bedtime. 14 tablet Reita May M, FNP   benzonatate (TESSALON) 100 MG capsule Take 1 capsule (100 mg total) by mouth every 8 (eight) hours. 21 capsule Carlisle Beers, FNP      PDMP not reviewed this encounter.   Carlisle Beers, [place] 12/22/22 1155

## 2022-12-22 NOTE — ED Triage Notes (Addendum)
Pt presents to the office for cough, sore throat and headache x 3 days. Pt has not taken any medication to help with symptoms.

## 2022-12-29 ENCOUNTER — Other Ambulatory Visit (HOSPITAL_BASED_OUTPATIENT_CLINIC_OR_DEPARTMENT_OTHER): Payer: Self-pay | Admitting: Nurse Practitioner

## 2022-12-29 DIAGNOSIS — R519 Headache, unspecified: Secondary | ICD-10-CM

## 2022-12-29 NOTE — Telephone Encounter (Signed)
LAST APT 10/10/22

## 2023-01-11 ENCOUNTER — Emergency Department (HOSPITAL_BASED_OUTPATIENT_CLINIC_OR_DEPARTMENT_OTHER)
Admission: EM | Admit: 2023-01-11 | Discharge: 2023-01-11 | Disposition: A | Discharge status: Home / Self Care | Payer: 59 | Attending: Emergency Medicine | Admitting: Emergency Medicine

## 2023-01-11 ENCOUNTER — Emergency Department (HOSPITAL_BASED_OUTPATIENT_CLINIC_OR_DEPARTMENT_OTHER): Payer: 59

## 2023-01-11 ENCOUNTER — Encounter (HOSPITAL_BASED_OUTPATIENT_CLINIC_OR_DEPARTMENT_OTHER): Payer: Self-pay | Admitting: Emergency Medicine

## 2023-01-11 ENCOUNTER — Other Ambulatory Visit: Payer: Self-pay

## 2023-01-11 DIAGNOSIS — R519 Headache, unspecified: Secondary | ICD-10-CM | POA: Diagnosis not present

## 2023-01-11 DIAGNOSIS — S8992XA Unspecified injury of left lower leg, initial encounter: Secondary | ICD-10-CM | POA: Diagnosis not present

## 2023-01-11 DIAGNOSIS — S8012XA Contusion of left lower leg, initial encounter: Secondary | ICD-10-CM | POA: Diagnosis not present

## 2023-01-11 DIAGNOSIS — Z23 Encounter for immunization: Secondary | ICD-10-CM | POA: Diagnosis not present

## 2023-01-11 DIAGNOSIS — Y9351 Activity, roller skating (inline) and skateboarding: Secondary | ICD-10-CM | POA: Insufficient documentation

## 2023-01-11 DIAGNOSIS — S0990XA Unspecified injury of head, initial encounter: Secondary | ICD-10-CM | POA: Insufficient documentation

## 2023-01-11 DIAGNOSIS — M25571 Pain in right ankle and joints of right foot: Secondary | ICD-10-CM | POA: Diagnosis not present

## 2023-01-11 DIAGNOSIS — M79605 Pain in left leg: Secondary | ICD-10-CM | POA: Diagnosis not present

## 2023-01-11 MED ORDER — TETANUS-DIPHTH-ACELL PERTUSSIS 5-2.5-18.5 LF-MCG/0.5 IM SUSY
0.5000 mL | PREFILLED_SYRINGE | Freq: Once | INTRAMUSCULAR | Status: AC
  Administered 2023-01-11: 0.5 mL via INTRAMUSCULAR
  Filled 2023-01-11: qty 0.5

## 2023-01-11 MED ORDER — CYCLOBENZAPRINE HCL 10 MG PO TABS: 10.0000 mg | ORAL_TABLET | Freq: Two times a day (BID) | ORAL | 0 refills | Status: DC | PRN

## 2023-01-11 MED ORDER — CYCLOBENZAPRINE HCL 5 MG PO TABS
5.0000 mg | ORAL_TABLET | Freq: Once | ORAL | Status: AC
  Administered 2023-01-11: 5 mg via ORAL
  Filled 2023-01-11: qty 1

## 2023-01-11 NOTE — Discharge Instructions (Signed)
You were seen after your rollerskating accident in the emergency department.   At home, please take over the counter Tylenol, ibuprofen, and the lidocaine patches for your pain.  You may also use the muscle relaxer (cyclobenzaprine) that we have prescribed you for your pain but do not take this before driving or operating heavy machinery.  It is normal for your pain and soreness to get worse over the next few days.  Follow-up with your primary doctor in 5-7 days regarding your visit.    Return immediately to the emergency department if you experience any of the following: Severe headache, numbness or weakness of your arms or legs, vomiting, or any other concerning symptoms.    Thank you for visiting our Emergency Department. It was a pleasure taking care of you today.

## 2023-01-11 NOTE — ED Notes (Signed)
Discharge paperwork given and verbally understood. 

## 2023-01-11 NOTE — ED Provider Notes (Signed)
[name] EMERGENCY DEPARTMENT AT [facility] Provider Note   CSN: [id number] Arrival date & time: 01/11/23  1755     History  Chief Complaint  Patient presents with   [name]    [name] is a 42 y.o. female.  42 year old female with a history of a Chiari malformation who presents to the emergency department after a fall while rollerskating.  At 5 PM was rollerskating and had a fall after going down a hill.  Did strike her head posteriorly and also hit her left lower extremity on a car.  Also having right ankle pain.  Has been able to bear weight.  No LOC.  Not on blood thinners.  No vomiting.       Home Medications Prior to Admission medications   Medication Sig Start Date End Date Taking? Authorizing Provider  cyclobenzaprine (FLEXERIL) 10 MG tablet Take 1 tablet (10 mg total) by mouth 2 (two) times daily as needed for muscle spasms. 01/11/23  Yes Rondel Baton, MD  albuterol (VENTOLIN HFA) 108 (90 Base) MCG/ACT inhaler Inhale 1-2 puffs into the lungs every 6 (six) hours as needed for wheezing or shortness of breath. 11/03/21   Tollie Eth, NP  ALPRAZolam Prudy Feeler) 0.25 MG tablet Take 1-2 tablets (0.25-0.5 mg total) by mouth 3 (three) times daily as needed for anxiety. 11/06/22   Joselyn Arrow, MD  aspirin-acetaminophen-caffeine (EXCEDRIN MIGRAINE) [phone] MG tablet Take 2 tablets by mouth every 6 (six) hours as needed for headache.    [provider]  benzonatate (TESSALON) 100 MG capsule Take 1 capsule (100 mg total) by mouth every 8 (eight) hours. 12/22/22   Carlisle Beers, FNP  betamethasone dipropionate 0.05 % cream Apply topically 2 (two) times daily. Patient not taking: Reported on 11/06/2022 10/10/22   Early, Sung Amabile, NP  busPIRone (BUSPAR) 15 MG tablet Take 1/3 tablet twice daily for a few days, then increase to 1/2 tablet twice daily. Titrate up further as directed by PCP 11/06/22   Joselyn Arrow, MD  busPIRone (BUSPAR) 5 MG tablet TAKE 1  TABLET BY MOUTH TWICE A DAY 11/27/22   Early, Sung Amabile, NP  calcium citrate (CALCITRATE - DOSED IN MG ELEMENTAL CALCIUM) 950 (200 Ca) MG tablet Take 400 mg of elemental calcium by mouth daily.    [provider]  Guaifenesin 1200 MG TB12 Take 1 tablet (1,200 mg total) by mouth in the morning and at bedtime. 12/22/22   Carlisle Beers, FNP  hydrOXYzine (ATARAX) 25 MG tablet Take 0.5-1 tablets (12.5-25 mg total) by mouth 3 (three) times daily as needed for anxiety (nervousness). 10/10/22   Tollie Eth, NP  ibuprofen (ADVIL) 800 MG tablet TAKE 1 TABLET BY MOUTH 2 TIMES DAILY AS NEEDED. 12/29/22   Early, Sung Amabile, NP  omeprazole (PRILOSEC) 40 MG capsule TAKE 1 CAPSULE (40 MG TOTAL) BY MOUTH DAILY. Patient not taking: Reported on 10/10/2022 10/03/22   Early, Sung Amabile, NP  sertraline (ZOLOFT) 50 MG tablet TAKE 1 TABLET BY MOUTH EVERYDAY AT BEDTIME 11/29/22   Early, Sung Amabile, NP      Allergies    Amoxicillin, Imitrex [sumatriptan base], Penicillins, Tramadol, Abilify [aripiprazole], Norco [hydrocodone-acetaminophen], Percocet [oxycodone-acetaminophen], and Soy allergy    Review of Systems   Review of Systems  Physical Exam Updated Vital Signs BP 121/84 (BP Location: Right Arm)   Pulse 86   Temp 98.3 F (36.8 C) (Oral)   Resp 16   SpO2 98%  Physical Exam Constitutional:  General: She is not in acute distress.    Appearance: Normal appearance. She is not ill-appearing.  HENT:     Head: Normocephalic and atraumatic.     Right Ear: Tympanic membrane, ear canal and external ear normal.     Left Ear: Tympanic membrane, ear canal and external ear normal.     Mouth/Throat:     Mouth: Mucous membranes are moist.     Pharynx: Oropharynx is clear.  Eyes:     Extraocular Movements: Extraocular movements intact.     Conjunctiva/sclera: Conjunctivae normal.     Pupils: Pupils are equal, round, and reactive to light.     Comments: 5 mm bilaterally  Neck:     Comments: No C-spine midline  tenderness to palpation Cardiovascular:     Rate and Rhythm: Normal rate and regular rhythm.     Pulses: Normal pulses.     Heart sounds: Normal heart sounds.  Pulmonary:     Effort: Pulmonary effort is normal. No respiratory distress.     Breath sounds: Normal breath sounds.  Abdominal:     General: Abdomen is flat. There is no distension.     Palpations: Abdomen is soft. There is no mass.     Tenderness: There is no abdominal tenderness. There is no guarding.  Musculoskeletal:        General: No deformity. Normal range of motion.     Cervical back: No rigidity or tenderness.     Comments: No tenderness to palpation of midline thoracic or lumbar spine.  No step-offs palpated.  No tenderness to palpation of chest wall.  No bruising noted.  No tenderness to palpation of bilateral clavicles.  No tenderness to palpation, bruising, or deformities noted of bilateral shoulders, elbows, wrists, hips, or knees.  Bruising to left calf with abrasion present.  Compartments soft.  DP pulses 2+ bilaterally.  Tenderness palpation of anterior right ankle.  No effusion or deformity noted.  Neurological:     General: No focal deficit present.     Mental Status: She is alert and oriented to person, place, and time. Mental status is at baseline.     Cranial Nerves: No cranial nerve deficit.     Sensory: No sensory deficit.     Motor: No weakness.     ED Results / Procedures / Treatments   Labs (all labs ordered are listed, but only abnormal results are displayed) Labs Reviewed - No data to display  EKG None  Radiology CT Head Wo Contrast  Result Date: 01/11/2023 CLINICAL DATA:  Fall wall volar skating, back of head, head pain EXAM: CT HEAD WITHOUT CONTRAST TECHNIQUE: Contiguous axial images were obtained from the base of the skull through the vertex without intravenous contrast. RADIATION DOSE REDUCTION: This exam was performed according to the departmental dose-optimization program which  includes automated exposure control, adjustment of the mA and/or kV according to patient size and/or use of iterative reconstruction technique. COMPARISON:  11/05/2022 FINDINGS: Brain: No evidence of acute infarction, hemorrhage, mass, mass effect, or midline shift. No hydrocephalus or extra-axial fluid collection. Vascular: No hyperdense vessel. Skull: Negative for fracture or focal lesion. Prior suboccipital craniectomy. No significant laceration and hematoma. Sinuses/Orbits: No acute finding. Other: The mastoid air cells are well aerated. IMPRESSION: No acute intracranial process. Electronically Signed   By: Wiliam Ke M.D.   On: 01/11/2023 20:14   DG Ankle Complete Right  Result Date: 01/11/2023 CLINICAL DATA:  Right ankle pain, fall while roller-skating. EXAM: RIGHT ANKLE -  COMPLETE 3+ VIEW COMPARISON:  None Available. FINDINGS: There is no evidence of fracture, dislocation, or joint effusion. The ankle mortise is preserved. There is no evidence of arthropathy or other focal bone abnormality. Soft tissues are unremarkable. IMPRESSION: No fracture or dislocation of the right ankle. Electronically Signed   By: Narda Rutherford M.D.   On: 01/11/2023 19:01   DG Tibia/Fibula Left  Result Date: 01/11/2023 CLINICAL DATA:  Left lower extremity pain, fall while roller-skating. EXAM: LEFT TIBIA AND FIBULA - 2 VIEW COMPARISON:  None Available. FINDINGS: There is no evidence of fracture or other focal bone lesions. The cortical margins of the tibia and fibular intact. Knee and ankle alignment are maintained. Generalized soft tissue edema, most prominent proximally. IMPRESSION: Soft tissue edema. No fracture. Electronically Signed   By: Narda Rutherford M.D.   On: 01/11/2023 19:00    Procedures Procedures    Medications Ordered in ED Medications  cyclobenzaprine (FLEXERIL) tablet 5 mg (5 mg Oral Given 01/11/23 1845)  Tdap (BOOSTRIX) injection 0.5 mL (0.5 mLs Intramuscular Given 01/11/23 1845)    ED  Course/ Medical Decision Making/ A&P                                 Medical Decision Making Amount and/or Complexity of Data Reviewed Radiology: ordered.  Risk Prescription drug management.   POCAHONTAS CRUS is a 42 y.o. female with comorbidities that complicate the patient evaluation including Chiari malformation who presents to the emergency department for head trauma and leg trauma after a rollerskating accident  Initial Ddx:  TBI, concussion, fracture  MDM/Course:  Patient presents to the emergency department with head trauma and leg trauma after a rollerskating accident.  No focal neurologic deficits on exam.  No C-spine midline tenderness to palpation either.  Does have swollen left calf with bruising and an abrasion.  Also has tenderness to palpation of the anterior portion of the right ankle.  With her history of a Chiari malformation a head CT was obtained that did not show acute abnormality.  Did not feel that C-spine imaging was warranted given her exam and the fact that she is Nexus negative though I did consider CT imaging.  X-ray of the lower extremities without fracture.  Upon re-evaluation was feeling better after the pain medication.  Gave her a prescription of Flexeril to take at home for her muscle pain.  Also updated her tetanus shot in the emergency department.  This patient presents to the ED for concern of complaints listed in HPI, this involves an extensive number of treatment options, and is a complaint that carries with it a high risk of complications and morbidity. Disposition including potential need for admission considered.   Dispo: DC to Facility  Additional history obtained from spouse Records reviewed Outpatient Clinic Notes I independently reviewed the following imaging with scope of interpretation limited to determining acute life threatening conditions related to emergency care: CT Head and agree with the radiologist interpretation with the  following exceptions: none I have reviewed the patients home medications and made adjustments as needed        Final Clinical Impression(s) / ED Diagnoses Final diagnoses:  Injury of left lower extremity, initial encounter  Minor head injury, initial encounter    Rx / DC Orders ED Discharge Orders          Ordered    cyclobenzaprine (FLEXERIL) 10 MG tablet  2 times daily  PRN        01/11/23 2022              Rondel Baton, MD 01/12/23 [phone]

## 2023-01-11 NOTE — ED Triage Notes (Signed)
Fall while roller skating. Around 5pm No loc. Hit back of head. Pain righ ankle and left shin. Left lower leg firm and tender to touch,  No bloodthinners

## 2023-01-19 ENCOUNTER — Ambulatory Visit (INDEPENDENT_AMBULATORY_CARE_PROVIDER_SITE_OTHER): Payer: 59 | Admitting: Medical

## 2023-01-19 ENCOUNTER — Encounter: Payer: Self-pay | Admitting: Medical

## 2023-01-19 VITALS — BP 122/80 | HR 84 | Wt 193.8 lb

## 2023-01-19 DIAGNOSIS — M79605 Pain in left leg: Secondary | ICD-10-CM

## 2023-01-19 DIAGNOSIS — S8012XD Contusion of left lower leg, subsequent encounter: Secondary | ICD-10-CM | POA: Diagnosis not present

## 2023-01-19 DIAGNOSIS — Y9351 Activity, roller skating (inline) and skateboarding: Secondary | ICD-10-CM

## 2023-01-19 NOTE — Progress Notes (Addendum)
Subjective:  [name] is a 42 y.o. female who presents for Chief Complaint  Patient presents with   other    Leg swelling, has fall roller skating hit a parked car, lt. Leg very sore, went to gym yesterday and tuesday and leg is sore and swollen now, swelling went down but still hurting pretty bad,      Here for leg pain, concern  Date of injury 01/11/2023  She was roller skating down a long gradual incline and ended up slamming into her car that was parked.  She hurt in multiple places initially was seen in the emergency department that same day.  She had some x-rays of her ankle and leg that were normal.  She also had a head CT that same day that did not show any acute bleed  Today her main complaint is left lower leg swelling and bruising.  She had bruising and 2 different knots in her left lower leg the day she went to the emergency department  Currently she has purplish bruising, a hard area in the middle of the bruising but also just general swelling pain of the left calf.  She can still walk but there is not as much as prudent as she thought she would have for now.  She is using ibuprofen 800 mg twice a day, ice.  She has been to the gym and exercise to different times since that injury which may have aggravated it.  She does not smoke.  She is not on any type of hormones or birth control.  She is status post hysterectomy.  No prior DVT or PE.  No other aggravating or relieving factors.    No other c/o.  Past Medical History:  Diagnosis Date   Anemia    h/o   Anginal pain (HCC)    pt. takes nitrostat- as needed, hasn't had since 04/2012, sees Dr. Sharyn Lull, last ekg was /w PCP- [facility]   Anxiety    Asthma    Borderline personality disorder (HCC)    Chiari malformation    Depression    E coli bacteremia 01/22/2012   GERD (gastroesophageal reflux disease)    Headache(784.0)    Headache(784.0) 01/19/2012   Major depressive disorder, recurrent episode, mild (HCC)  04/20/2018   Meningitis    Oct. 2013   Neuromuscular disorder (HCC)    muscle spasms-back & arms    SVT (supraventricular tachycardia) 01/22/2012   UTI (lower urinary tract infection) 01/20/2012   Current Outpatient Medications on File Prior to Visit  Medication Sig Dispense Refill   albuterol (VENTOLIN HFA) 108 (90 Base) MCG/ACT inhaler Inhale 1-2 puffs into the lungs every 6 (six) hours as needed for wheezing or shortness of breath. 18 g 3   calcium citrate (CALCITRATE - DOSED IN MG ELEMENTAL CALCIUM) 950 (200 Ca) MG tablet Take 400 mg of elemental calcium by mouth daily.     Guaifenesin 1200 MG TB12 Take 1 tablet (1,200 mg total) by mouth in the morning and at bedtime. 14 tablet 0   ibuprofen (ADVIL) 800 MG tablet TAKE 1 TABLET BY MOUTH 2 TIMES DAILY AS NEEDED. 60 tablet 1   omeprazole (PRILOSEC) 40 MG capsule TAKE 1 CAPSULE (40 MG TOTAL) BY MOUTH DAILY. 90 capsule 1   aspirin-acetaminophen-caffeine (EXCEDRIN MIGRAINE) 250-250-65 MG tablet Take 2 tablets by mouth every 6 (six) hours as needed for headache. (Patient not taking: Reported on 01/19/2023)     benzonatate (TESSALON) 100 MG capsule Take 1 capsule (100 mg  total) by mouth every 8 (eight) hours. (Patient not taking: Reported on 01/19/2023) 21 capsule 0   betamethasone dipropionate 0.05 % cream Apply topically 2 (two) times daily. (Patient not taking: Reported on 11/06/2022) 30 g 2   busPIRone (BUSPAR) 15 MG tablet Take 1/3 tablet twice daily for a few days, then increase to 1/2 tablet twice daily. Titrate up further as directed by PCP (Patient not taking: Reported on 01/19/2023) 60 tablet 0   busPIRone (BUSPAR) 5 MG tablet TAKE 1 TABLET BY MOUTH TWICE A DAY (Patient not taking: Reported on 01/19/2023) 180 tablet 0   cyclobenzaprine (FLEXERIL) 10 MG tablet Take 1 tablet (10 mg total) by mouth 2 (two) times daily as needed for muscle spasms. (Patient not taking: Reported on 01/19/2023) 20 tablet 0   hydrOXYzine (ATARAX) 25 MG tablet Take 0.5-1  tablets (12.5-25 mg total) by mouth 3 (three) times daily as needed for anxiety (nervousness). (Patient not taking: Reported on 01/19/2023) 60 tablet 3   No current facility-administered medications on file prior to visit.     The following portions of the patient's history were reviewed and updated as appropriate: allergies, current medications, past family history, past medical history, past social history, past surgical history and problem list.  ROS Otherwise as in subjective above    Objective: BP 122/80   Pulse 84   Wt 193 lb 12.8 oz (87.9 kg)   BMI 33.27 kg/m   General appearance: alert, no distress, well developed, well nourished Left lower leg proximal portion with large area of purplish coloration along the perimeter of the bruised area which comprises the first half of her left lower leg medially.  There is an area of induration in the middle suggestive of hematoma but she has general tenderness of the calf and medial proximal lower leg Sensation and pulses normal She is able to bear weight and ambulate without significant pain but there is some pain noted Mild homans noted on left    Assessment: Encounter Diagnoses  Name Primary?   Hematoma of left lower extremity, subsequent encounter Yes   Left leg pain    Injury while roller skating      Plan: We discussed symptoms and mechanism of injury.  We will get an ultrasound to help evaluate for DVT.  I suspect her main issue is just a hematoma and bruising.  Assuming the ultrasound is negative, we will use the following recommendations:  Over the next 3 to 4 days use relative rest Use a combination of ice and heat.  Do heat for 20 minutes then let cool down.  Do cold therapy for 20 minutes.  You can alternate these the next few days. Continue ibuprofen 200 mg over-the-counter, 2 to 3 tablets twice daily for the next 5 to 7 days for pain and inflammation Consider wearing compression socks for the next few weeks A  hematoma will have to resolve slowly over time.  This can take several weeks to months to fully resolve Avoid re injury Gradually get back into your exercise routine   Addendum: While we were trying to find a place to schedule her STAT lower extremity ultrasound today, she ended up leaving the office.  We tried to call her but we got no answer after multiple attempts this afternoon.  Is Friday afternoon and we were only able to find 1 locations and could do an ultrasound STAT this afternoon without having to go to the emergency department for evaluation but unfortunately we cannot get a  hold of the patient currently.     Monice was seen today for other.  Diagnoses and all orders for this visit:  Hematoma of left lower extremity, subsequent encounter -     US Venous Img Lower Unilateral Left (DVT); Future -     US Venous Img Lower Unilateral Left (DVT); Future  Left leg pain -     US Venous Img Lower Unilateral Left (DVT); Future -     US Venous Img Lower Unilateral Left (DVT); Future  Injury while roller skating -     US Venous Img Lower Unilateral Left (DVT); Future -     US Venous Img Lower Unilateral Left (DVT); Future    Follow up: pending ultrasound

## 2023-01-19 NOTE — Addendum Note (Signed)
Addended by: Jac Canavan on: 01/19/2023 04:34 PM   Modules accepted: Orders

## 2023-01-19 NOTE — Patient Instructions (Signed)
We discussed symptoms and mechanism of injury.  We will get an ultrasound to help evaluate for DVT.  I suspect her main issue is just a hematoma and bruising.  Assuming the ultrasound is negative, we will use the following recommendations:  Over the next 3 to 4 days use relative rest Use a combination of ice and heat.  Do heat for 20 minutes then let cool down.  Do cold therapy for 20 minutes.  You can alternate these the next few days. Continue ibuprofen 200 mg over-the-counter, 2 to 3 tablets twice daily for the next 5 to 7 days for pain and inflammation Consider wearing compression socks for the next few weeks A hematoma will have to resolve slowly over time.  This can take several weeks to months to fully resolve Avoid re injury Gradually get back into your exercise routine     Hematoma A hematoma is a collection of blood. A hematoma can happen: Under the skin. In an organ. In a body space. In a joint space. In other tissues. The blood can thicken (clot) to form a lump that you can see and feel. The lump is often hard and may become sore and tender. The lump can be very small or very big. Most hematomas get better in a few days to weeks. However, some of these may be serious and need medical care. What are the causes? This condition is caused by: An injury. Blood that leaks under the skin. Problems from surgeries. Medical conditions that cause bleeding or bruising. What increases the risk? You are more likely to develop this condition if: You are an older adult. You use medicines that thin your blood. You use NSAIDs, such as ibuprofen, often for pain. You play contact sports. What are the signs or symptoms? Symptoms depend on where the hematoma is in your body. If the hematoma is under the skin, there is: A firm lump on the body. Pain and tenderness in the area. Bruising. The skin above the lump may be blue, dark blue, purple-red, or yellowish. If the hematoma is deep  in the tissues or body spaces, there may be: Blood in the stomach. This may cause pain in the belly (abdomen), weakness, passing out (fainting), and shortness of breath. Blood in the head. This may cause a headache, weakness, trouble speaking or understanding speech, or passing out. How is this treated? Treatment depends on the cause, size, and location of the hematoma. Treatment may include: Doing nothing. Many hematomas go away on their own without treatment. Surgery or close monitoring. This may be needed for large hematomas or hematomas that affect the body's organs. Medicines. These may be given if a medical condition caused the hematoma. Follow these instructions at home: Managing pain, stiffness, and swelling  If told, put ice on the injured area. To do this: Put ice in a plastic bag. Place a towel between your skin and the bag. Leave the ice on for 20 minutes, 2-3 times a day for the first two days. If your skin turns bright red, take off the ice right away to prevent skin damage. The risk of skin damage is higher if you cannot feel pain, heat, or cold. If told, put heat on the injured area. Do this as often as told by your doctor. Use the heat source that your doctor recommends, such as a moist heat pack or a heating pad. Place a towel between your skin and the heat source. Leave the heat on for 20-30 minutes.  If your skin turns bright red, take off the heat right away to prevent burns. The risk of burns is higher if you cannot feel pain, heat, or cold. Raise the injured area above the level of your heart while you are sitting or lying down. Wrap the affected area with an elastic bandage, if told by your doctor. Do not wrap the bandage too tight. If your hematoma is on a leg or foot and is painful, your doctor may give you crutches. Use them as told by your doctor. General instructions Take over-the-counter and prescription medicines only as told by your doctor. Keep all follow-up  visits. Your doctor may want to see how your hematoma is healing with treatment. Contact a doctor if: You have a fever. The swelling or bruising gets worse. You start to get more hematomas. Your pain gets worse. Your pain is not getting better with medicine. The skin over the hematoma breaks or starts to bleed. Get help right away if: Your hematoma is in your chest or belly and you: Pass out. Feel weak. Become short of breath. You have a hematoma on your scalp that is caused by a fall or injury, and you: Have a headache that gets worse. Have trouble speaking or understanding speech. Become less alert or you pass out. These symptoms may be an emergency. Get help right away. Call 911. Do not wait to see if the symptoms will go away. Do not drive yourself to the hospital This information is not intended to replace advice given to you by your health care provider. Make sure you discuss any questions you have with your health care provider. Document Revised: 10/03/2021 Document Reviewed: 10/03/2021 Elsevier Patient Education  2024 [employer].

## 2023-01-20 ENCOUNTER — Other Ambulatory Visit: Payer: Self-pay

## 2023-01-20 ENCOUNTER — Emergency Department (HOSPITAL_BASED_OUTPATIENT_CLINIC_OR_DEPARTMENT_OTHER)
Admission: EM | Admit: 2023-01-20 | Discharge: 2023-01-21 | Disposition: A | Discharge status: Home / Self Care | Payer: 59 | Attending: Emergency Medicine | Admitting: Emergency Medicine

## 2023-01-20 DIAGNOSIS — S8012XA Contusion of left lower leg, initial encounter: Secondary | ICD-10-CM | POA: Diagnosis not present

## 2023-01-20 DIAGNOSIS — Y9351 Activity, roller skating (inline) and skateboarding: Secondary | ICD-10-CM | POA: Insufficient documentation

## 2023-01-20 DIAGNOSIS — Y92481 Parking lot as the place of occurrence of the external cause: Secondary | ICD-10-CM | POA: Insufficient documentation

## 2023-01-20 DIAGNOSIS — S8992XA Unspecified injury of left lower leg, initial encounter: Secondary | ICD-10-CM | POA: Diagnosis present

## 2023-01-20 NOTE — ED Triage Notes (Signed)
PT self ambulated to triage c/o left lower leg pain 8/10 burning in nature x 12 hours. Pt had roller skating accident 9/19 resulting in injury to leg. Leg appears swollen and red to inner calf. Pulses marked Pt denies loss of sensation. VSS NAD PT on room air.

## 2023-01-21 ENCOUNTER — Ambulatory Visit (HOSPITAL_BASED_OUTPATIENT_CLINIC_OR_DEPARTMENT_OTHER)
Admission: RE | Admit: 2023-01-21 | Discharge: 2023-01-21 | Disposition: A | Discharge status: Home / Self Care | Payer: 59 | Source: Ambulatory Visit | Attending: Emergency Medicine | Admitting: Emergency Medicine

## 2023-01-21 ENCOUNTER — Other Ambulatory Visit (HOSPITAL_BASED_OUTPATIENT_CLINIC_OR_DEPARTMENT_OTHER): Payer: Self-pay | Admitting: Emergency Medicine

## 2023-01-21 DIAGNOSIS — M79605 Pain in left leg: Secondary | ICD-10-CM | POA: Diagnosis not present

## 2023-01-21 DIAGNOSIS — S8012XA Contusion of left lower leg, initial encounter: Secondary | ICD-10-CM

## 2023-01-21 DIAGNOSIS — T148XXA Other injury of unspecified body region, initial encounter: Secondary | ICD-10-CM | POA: Diagnosis not present

## 2023-01-21 NOTE — ED Provider Notes (Signed)
[facility] AT [facility] Provider Note   CSN: [id number] Arrival date & time: 01/20/23  2101     History  Chief Complaint  Patient presents with   Leg Swelling    [name] is a 42 y.o. female.  Patient is a 42 year old female with no significant past medical history.  Patient presenting with complaints of a left leg injury.  She was rollerskating through a parking lot when she got going too fast, lost control, then struck her leg on her own car.  This occurred 3 days ago.  Since that time she has had increased pain and swelling to the medial aspect of the left calf.  She also has some bruising extending from the impact site down into the ankle.  She has been able to walk on it, but with some discomfort.  The history is provided by the patient.       Home Medications Prior to Admission medications   Medication Sig Start Date End Date Taking? Authorizing Provider  albuterol (VENTOLIN HFA) 108 (90 Base) MCG/ACT inhaler Inhale 1-2 puffs into the lungs every 6 (six) hours as needed for wheezing or shortness of breath. 11/03/21   Tollie Eth, NP  aspirin-acetaminophen-caffeine (EXCEDRIN MIGRAINE) [phone] MG tablet Take 2 tablets by mouth every 6 (six) hours as needed for headache. Patient not taking: Reported on 01/19/2023    [provider]  benzonatate (TESSALON) 100 MG capsule Take 1 capsule (100 mg total) by mouth every 8 (eight) hours. Patient not taking: Reported on 01/19/2023 12/22/22   Carlisle Beers, FNP  betamethasone dipropionate 0.05 % cream Apply topically 2 (two) times daily. Patient not taking: Reported on 11/06/2022 10/10/22   Early, Sung Amabile, NP  busPIRone (BUSPAR) 15 MG tablet Take 1/3 tablet twice daily for a few days, then increase to 1/2 tablet twice daily. Titrate up further as directed by PCP Patient not taking: Reported on 01/19/2023 11/06/22   Joselyn Arrow, MD  busPIRone (BUSPAR) 5 MG tablet TAKE 1 TABLET BY  MOUTH TWICE A DAY Patient not taking: Reported on 01/19/2023 11/27/22   Tollie Eth, NP  calcium citrate (CALCITRATE - DOSED IN MG ELEMENTAL CALCIUM) 950 (200 Ca) MG tablet Take 400 mg of elemental calcium by mouth daily.    [provider]  cyclobenzaprine (FLEXERIL) 10 MG tablet Take 1 tablet (10 mg total) by mouth 2 (two) times daily as needed for muscle spasms. Patient not taking: Reported on 01/19/2023 01/11/23   Rondel Baton, MD  Guaifenesin 1200 MG TB12 Take 1 tablet (1,200 mg total) by mouth in the morning and at bedtime. 12/22/22   Carlisle Beers, FNP  hydrOXYzine (ATARAX) 25 MG tablet Take 0.5-1 tablets (12.5-25 mg total) by mouth 3 (three) times daily as needed for anxiety (nervousness). Patient not taking: Reported on 01/19/2023 10/10/22   Early, Sung Amabile, NP  ibuprofen (ADVIL) 800 MG tablet TAKE 1 TABLET BY MOUTH 2 TIMES DAILY AS NEEDED. 12/29/22   Early, Sung Amabile, NP  omeprazole (PRILOSEC) 40 MG capsule TAKE 1 CAPSULE (40 MG TOTAL) BY MOUTH DAILY. 10/03/22   Tollie Eth, NP      Allergies    Amoxicillin, Imitrex [sumatriptan base], Penicillins, Tramadol, Abilify [aripiprazole], Norco [hydrocodone-acetaminophen], Percocet [oxycodone-acetaminophen], and Soy allergy    Review of Systems   Review of Systems  All other systems reviewed and are negative.   Physical Exam Updated Vital Signs BP 120/69   Pulse 70   Temp  98.6 F (37 C) (Oral)   Resp 20   Wt 87.9 kg   SpO2 100%   BMI 33.26 kg/m  Physical Exam Vitals and nursing note reviewed.  Constitutional:      Appearance: Normal appearance.  Pulmonary:     Effort: Pulmonary effort is normal.  Musculoskeletal:     Comments: The left lower extremity is noted to have a firm, tender area noted to the medial aspect of the left calf consistent with a hematoma.  There is ecchymosis extending around this area and into the ankle.  DP pulses are easily palpable and motor and sensation are intact throughout the entire  foot.  Skin:    General: Skin is warm and dry.  Neurological:     Mental Status: She is alert.     ED Results / Procedures / Treatments   Labs (all labs ordered are listed, but only abnormal results are displayed) Labs Reviewed - No data to display  EKG None  Radiology No results found.  Procedures Procedures    Medications Ordered in ED Medications - No data to display  ED Course/ Medical Decision Making/ A&P  Patient's injury is most consistent with a leg contusion/hematoma.  I highly doubt DVT, but will order an ultrasound as an outpatient to be sure.  Patient to be discharged with rest, elevation, warm compresses, and return as needed.  There are no signs or symptoms of compartment syndrome.  Final Clinical Impression(s) / ED Diagnoses Final diagnoses:  None    Rx / DC Orders ED Discharge Orders     None         Geoffery Lyons, MD 01/21/23 0030

## 2023-01-21 NOTE — Discharge Instructions (Signed)
Elevate your leg is much as possible for the next several days.  Apply heating pad to help dissipate the hematoma.  Return tomorrow at the given time for an ultrasound of your leg to rule out a blood clot in one of the deep vessels.  Take ibuprofen 600 mg every 6 hours as needed for pain.

## 2023-01-22 ENCOUNTER — Other Ambulatory Visit: Payer: 59

## 2023-01-22 NOTE — Telephone Encounter (Signed)
Please call patient.  She left before we could say by on Friday.  We were really trying to work hard to find a place to get an ultrasound but no one had openings Friday afternoon.  I see where she went to the emergency department over the weekend and fortunately there was no blood clot.  How is she doing today?  Typically it takes months for a hematoma to resolve.  Typically we would use heat, such as warm compresses or warm rag periodically, and I generally recommend aspirin over-the-counter 325 mg daily for 2 weeks and then 81 mg daily for a couple weeks.    Did they give her other recommendations at the hospital

## 2023-01-29 ENCOUNTER — Encounter: Payer: Self-pay | Admitting: Neurology

## 2023-01-29 ENCOUNTER — Ambulatory Visit (INDEPENDENT_AMBULATORY_CARE_PROVIDER_SITE_OTHER): Payer: 59 | Admitting: Neurology

## 2023-01-29 VITALS — BP 93/50 | HR 78 | Ht 64.0 in | Wt 195.0 lb

## 2023-01-29 DIAGNOSIS — R569 Unspecified convulsions: Secondary | ICD-10-CM | POA: Diagnosis not present

## 2023-01-29 NOTE — Progress Notes (Signed)
[facility]  PATIENT: [name] DOB: Oct 31, 1980  REQUESTING CLINICIAN: Tilden Fossa, MD HISTORY FROM: Patient/Chart review  REASON FOR VISIT: Seizure like activity    HISTORICAL  CHIEF COMPLAINT:  Chief Complaint  Patient presents with   Hospitalization Follow-up    Rm12, alone, NP ED referral for seizure vs PNES: pt stated since home doing well.     HISTORY OF PRESENT ILLNESS:  This is a 42 year old woman past medical history of bipolar disorder, prediabetes, anxiety who is presenting with seizure-like activity.  Patient tells me she visited the ED on July 13 and 14, each for seizure-like activity.  She described activity as stiffness, rigidity with preserved awareness.  She denies any previous history of seizures.  Two days prior to the first event, patient tells me she was started on Abilify but per chart review it was Zoloft 50 mg daily.  She took the medication for 2 days then had the first event.  Since then she did follow-up with her PCP, who discontinued her Zoloft and put her back on Celexa.  Since then she has not had any event.  She is back to her normal self.  Denies any previous history of seizure or seizure activity.       OTHER MEDICAL CONDITIONS: Anxiety, bipolar disorder, prediabetes  REVIEW OF SYSTEMS: Full 14 system review of systems performed and negative with exception of: As noted in the HPI  ALLERGIES: Allergies  Allergen Reactions   Amoxicillin Anaphylaxis and Hives    Has patient had a PCN reaction causing immediate rash, facial/tongue/throat swelling, SOB or lightheadedness with hypotension: Yes Has patient had a PCN reaction causing severe rash involving mucus membranes or skin necrosis: Yes Has patient had a PCN reaction that required hospitalization No Has patient had a PCN reaction occurring within the last 10 years: No If all of the above answers are "NO", then may proceed with Cephalosporin use.    Imitrex  [Sumatriptan Base] Anaphylaxis and Hives   Penicillins Anaphylaxis and Hives    Has patient had a PCN reaction causing immediate rash, facial/tongue/throat swelling, SOB or lightheadedness with hypotension: Yes Has patient had a PCN reaction causing severe rash involving mucus membranes or skin necrosis: Yes Has patient had a PCN reaction that required hospitalization No Has patient had a PCN reaction occurring within the last 10 years: No If all of the above answers are "NO", then may proceed with Cephalosporin use.    Tramadol Shortness Of Breath and Palpitations   Abilify [Aripiprazole]     seizures   Norco [Hydrocodone-Acetaminophen] Other (See Comments)    Hallucinations    Percocet [Oxycodone-Acetaminophen] Other (See Comments)    Hallucinations    Soy Allergy Hives    HOME MEDICATIONS: Outpatient Medications Prior to Visit  Medication Sig Dispense Refill   albuterol (VENTOLIN HFA) 108 (90 Base) MCG/ACT inhaler Inhale 1-2 puffs into the lungs every 6 (six) hours as needed for wheezing or shortness of breath. 18 g 3   aspirin-acetaminophen-caffeine (EXCEDRIN MIGRAINE) 250-250-65 MG tablet Take 2 tablets by mouth every 6 (six) hours as needed for headache.     benzonatate (TESSALON) 100 MG capsule Take 1 capsule (100 mg total) by mouth every 8 (eight) hours. 21 capsule 0   betamethasone dipropionate 0.05 % cream Apply topically 2 (two) times daily. 30 g 2   calcium citrate (CALCITRATE - DOSED IN MG ELEMENTAL CALCIUM) 950 (200 Ca) MG tablet Take 400 mg of elemental calcium by mouth daily.  ibuprofen (ADVIL) 800 MG tablet TAKE 1 TABLET BY MOUTH 2 TIMES DAILY AS NEEDED. 60 tablet 1   omeprazole (PRILOSEC) 40 MG capsule TAKE 1 CAPSULE (40 MG TOTAL) BY MOUTH DAILY. 90 capsule 1   busPIRone (BUSPAR) 15 MG tablet Take 1/3 tablet twice daily for a few days, then increase to 1/2 tablet twice daily. Titrate up further as directed by PCP (Patient not taking: Reported on 01/19/2023) 60 tablet 0    busPIRone (BUSPAR) 5 MG tablet TAKE 1 TABLET BY MOUTH TWICE A DAY (Patient not taking: Reported on 01/19/2023) 180 tablet 0   cyclobenzaprine (FLEXERIL) 10 MG tablet Take 1 tablet (10 mg total) by mouth 2 (two) times daily as needed for muscle spasms. (Patient not taking: Reported on 01/19/2023) 20 tablet 0   Guaifenesin 1200 MG TB12 Take 1 tablet (1,200 mg total) by mouth in the morning and at bedtime. 14 tablet 0   hydrOXYzine (ATARAX) 25 MG tablet Take 0.5-1 tablets (12.5-25 mg total) by mouth 3 (three) times daily as needed for anxiety (nervousness). (Patient not taking: Reported on 01/19/2023) 60 tablet 3   No facility-administered medications prior to visit.    PAST MEDICAL HISTORY: Past Medical History:  Diagnosis Date   Anemia    h/o   Anginal pain (HCC)    pt. takes nitrostat- as needed, hasn't had since 04/2012, sees Dr. 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 2013   Neuromuscular disorder (HCC)    muscle spasms-back & arms    SVT (supraventricular tachycardia) (HCC) 01/22/2012   UTI (lower urinary tract infection) 01/20/2012    PAST SURGICAL HISTORY: Past Surgical History:  Procedure Laterality Date   ABDOMINAL HYSTERECTOMY     PERIPHERALLY INSERTED CENTRAL CATHETER INSERTION     during event of Meningitis- then d/c'd to home /w & treated /w antibiotics    SUBOCCIPITAL CRANIECTOMY CERVICAL LAMINECTOMY N/A 07/22/2012   Procedure: SUBOCCIPITAL CRANIECTOMY CERVICAL LAMINECTOMY/DURAPLASTY;  Surgeon: Hewitt Shorts, MD;  Location: [facility];  Service: Neurosurgery;  Laterality: N/A;  Suboccipital craniectomy with upper cervial laminectomy with duroplasty    TUBAL  LIGATION     VAGINAL DELIVERY     x3   WISDOM TOOTH EXTRACTION      FAMILY HISTORY: Family History  Problem Relation Age of Onset   Heart attack Mother    Healthy Father     SOCIAL HISTORY: Social History   Socioeconomic History   Marital status: Married    Spouse name: Not on file   Number of children: Not on file   Years of education: Not on file   Highest education level: Not on file  Occupational History   Not on file  Tobacco Use   Smoking status: Never    Passive exposure: Never   Smokeless tobacco: Never  Vaping Use   Vaping status: Never Used  Substance and Sexual Activity   Alcohol use: No   Drug use: No   Sexual activity: Yes    Birth control/protection: Surgical  Other Topics Concern   Not on file  Social History Narrative   Not on file   Social Determinants of  Health   Financial Resource Strain: Low Risk  (09/19/2022)   Overall Financial Resource Strain (CARDIA)    Difficulty of Paying Living Expenses: Not hard at all  Food Insecurity: No Food Insecurity (09/19/2022)   Hunger Vital Sign    Worried About Running Out of Food in the Last Year: Never true    Ran Out of Food in the Last Year: Never true  Transportation Needs: No Transportation Needs (09/19/2022)   PRAPARE - Administrator, Civil Service (Medical): No    Lack of Transportation (Non-Medical): No  Physical Activity: Insufficiently Active (09/19/2022)   Exercise Vital Sign    Days of Exercise per Week: 3 days    Minutes of Exercise per Session: 30 min  Stress: No Stress Concern Present (09/19/2022)   [employer] of Occupational Health - Occupational Stress Questionnaire    Feeling of Stress : Only a little  Social Connections: Not on file  Intimate Partner Violence: Not on file    PHYSICAL EXAM  GENERAL EXAM/CONSTITUTIONAL: Vitals:  Vitals:   01/29/23 0814  BP: (!) 93/50  Pulse: 78  Weight: 195 lb (88.5 kg)  Height: 5\' 4"  (1.626 m)   Body mass index is 33.47  kg/m. Wt Readings from Last 3 Encounters:  01/29/23 195 lb (88.5 kg)  01/20/23 193 lb 12.6 oz (87.9 kg)  01/19/23 193 lb 12.8 oz (87.9 kg)   Patient is in no distress; well developed, nourished and groomed; neck is supple  MUSCULOSKELETAL: Gait, strength, tone, movements noted in Neurologic exam below  NEUROLOGIC: MENTAL STATUS:      No data to display         awake, alert, oriented to person, place and time recent and remote memory intact normal attention and concentration language fluent, comprehension intact, naming intact fund of knowledge appropriate  CRANIAL NERVE:  2nd, 3rd, 4th, 6th - Visual fields full to confrontation, extraocular muscles intact, no nystagmus 5th - facial sensation symmetric 7th - facial strength symmetric 8th - hearing intact 9th - palate elevates symmetrically, uvula midline 11th - shoulder shrug symmetric 12th - tongue protrusion midline  MOTOR:  normal bulk and tone, full strength in the BUE, BLE  SENSORY:  normal and symmetric to light touch  COORDINATION:  finger-nose-finger, fine finger movements normal  GAIT/STATION:  normal   DIAGNOSTIC DATA (LABS, IMAGING, TESTING) - I reviewed patient records, labs, notes, testing and imaging myself where available.  Lab Results  Component Value Date   WBC 6.9 11/05/2022   HGB 12.9 11/05/2022   HCT 38.0 11/05/2022   MCV 87.9 11/05/2022   PLT 317 11/05/2022      Component Value Date/Time   NA 137 11/05/2022 1843   NA 138 02/21/2022 1038   K 3.5 11/05/2022 1843   CL 104 11/05/2022 1843   CO2 22 11/05/2022 1837   GLUCOSE 106 (H) 11/05/2022 1843   BUN <3 (L) 11/05/2022 1843   BUN 6 02/21/2022 1038   CREATININE 0.70 11/05/2022 1843   CALCIUM 9.2 11/05/2022 1837   PROT 7.2 11/05/2022 1837   PROT 7.4 02/21/2022 1038   ALBUMIN 3.6 11/05/2022 1837   ALBUMIN 4.1 02/21/2022 1038   AST 19 11/05/2022 1837   ALT 10 11/05/2022 1837   ALKPHOS 76 11/05/2022 1837   BILITOT 0.3  11/05/2022 1837   BILITOT 0.3 02/21/2022 1038   GFRNONAA >60 11/05/2022 1837   GFRAA >60 09/10/2019 1343   Lab Results  Component Value Date   CHOL 151 11/03/2021  HDL 60 11/03/2021   LDLCALC 74 11/03/2021   LDLDIRECT 80 11/03/2021   TRIG 93 11/03/2021   Lab Results  Component Value Date   HGBA1C 5.9 (H) 02/21/2022   Lab Results  Component Value Date   VITAMINB12 >2000 (H) 02/21/2022   Lab Results  Component Value Date   TSH 1.850 02/21/2022    Head CT 01/11/2023 No acute intracranial process. Prior suboccipital craniectomy  I personally reviewed brain Images.   ASSESSMENT AND PLAN  42 y.o. year old female  with history of anxiety, depression, bipolar disorder, prediabetes who is presenting with 2 events, described as stiffness, rigidity, eye fluttering with preservation of awareness.  This was in the setting of starting Zoloft 50 mg daily.  Since discontinuation of Zoloft, patient has not had any additional events.  Patient seizure like activity was possibly a side effect of Zoloft.  For now we will continue to observe her, if she does have additional event, we will obtain routine EEG.  Did advise her to follow-up with her PCP and to psychiatry has scheduled.  Return as needed.   1. Seizure-like activity [facility])     Patient Instructions  Continue to follow up with PCP  Return as needed    Per [facility] statutes, patients with seizures are not allowed to drive until they have been seizure-free for six months.  Other recommendations include using caution when using heavy equipment or power tools. Avoid working on ladders or at heights. Take showers instead of baths.  Do not swim alone.  Ensure the water temperature is not too high on the home water heater. Do not go swimming alone. Do not lock yourself in a room alone (i.e. bathroom). When caring for infants or small children, sit down when holding, feeding, or changing them to minimize risk of injury to the child in  the event you have a seizure. Maintain good sleep hygiene. Avoid alcohol.  Also recommend adequate sleep, hydration, good diet and minimize stress.   During the Seizure  - First, ensure adequate ventilation and place patients on the floor on their left side  Loosen clothing around the neck and ensure the airway is patent. If the patient is clenching the teeth, do not force the mouth open with any object as this can cause severe damage - Remove all items from the surrounding that can be hazardous. The patient may be oblivious to what's happening and may not even know what he or she is doing. If the patient is confused and wandering, either gently guide him/her away and block access to outside areas - Reassure the individual and be comforting - Call 911. In most cases, the seizure ends before EMS arrives. However, there are cases when seizures may last over 3 to 5 minutes. Or the individual may have developed breathing difficulties or severe injuries. If a pregnant patient or a person with diabetes develops a seizure, it is prudent to call an ambulance. - Finally, if the patient does not regain full consciousness, then call EMS. Most patients will remain confused for about 45 to 90 minutes after a seizure, so you must use judgment in calling for help. - Avoid restraints but make sure the patient is in a bed with padded side rails - Place the individual in a lateral position with the neck slightly flexed; this will help the saliva drain from the mouth and prevent the tongue from falling backward - Remove all nearby furniture and other hazards from the area -  Provide verbal assurance as the individual is regaining consciousness - Provide the patient with privacy if possible - Call for help and start treatment as ordered by the caregiver   After the Seizure (Postictal Stage)  After a seizure, most patients experience confusion, fatigue, muscle pain and/or a headache. Thus, one should permit the  individual to sleep. For the next few days, reassurance is essential. Being calm and helping reorient the person is also of importance.  Most seizures are painless and end spontaneously. Seizures are not harmful to others but can lead to complications such as stress on the lungs, brain and the heart. Individuals with prior lung problems may develop labored breathing and respiratory distress.     No orders of the defined types were placed in this encounter.   No orders of the defined types were placed in this encounter.   Return if symptoms worsen or fail to improve.    Windell Norfolk, MD 01/29/2023, 8:47 AM  [facility] [address], [address] [place], [place] [address] [phone]

## 2023-01-29 NOTE — Patient Instructions (Signed)
Continue to follow up with PCP  Return as needed  

## 2023-02-01 ENCOUNTER — Encounter: Payer: Self-pay | Admitting: Nurse Practitioner

## 2023-02-01 ENCOUNTER — Ambulatory Visit (INDEPENDENT_AMBULATORY_CARE_PROVIDER_SITE_OTHER): Payer: 59 | Admitting: Nurse Practitioner

## 2023-02-01 ENCOUNTER — Other Ambulatory Visit (HOSPITAL_COMMUNITY)
Admission: RE | Admit: 2023-02-01 | Discharge: 2023-02-01 | Disposition: A | Discharge status: Home / Self Care | Payer: 59 | Source: Ambulatory Visit | Attending: Nurse Practitioner | Admitting: Nurse Practitioner

## 2023-02-01 VITALS — BP 124/78 | HR 80 | Ht 65.0 in | Wt 194.4 lb

## 2023-02-01 DIAGNOSIS — Z13228 Encounter for screening for other metabolic disorders: Secondary | ICD-10-CM | POA: Insufficient documentation

## 2023-02-01 DIAGNOSIS — Z0000 Encounter for general adult medical examination without abnormal findings: Secondary | ICD-10-CM

## 2023-02-01 DIAGNOSIS — Z113 Encounter for screening for infections with a predominantly sexual mode of transmission: Secondary | ICD-10-CM | POA: Insufficient documentation

## 2023-02-01 DIAGNOSIS — Z1329 Encounter for screening for other suspected endocrine disorder: Secondary | ICD-10-CM

## 2023-02-01 DIAGNOSIS — Z1151 Encounter for screening for human papillomavirus (HPV): Secondary | ICD-10-CM | POA: Insufficient documentation

## 2023-02-01 DIAGNOSIS — Z23 Encounter for immunization: Secondary | ICD-10-CM | POA: Diagnosis not present

## 2023-02-01 DIAGNOSIS — Z1321 Encounter for screening for nutritional disorder: Secondary | ICD-10-CM | POA: Insufficient documentation

## 2023-02-01 DIAGNOSIS — Z130 Encounter for screening for diseases of the blood and blood-forming organs and certain disorders involving the immune mechanism: Secondary | ICD-10-CM | POA: Diagnosis not present

## 2023-02-01 DIAGNOSIS — L739 Follicular disorder, unspecified: Secondary | ICD-10-CM | POA: Diagnosis not present

## 2023-02-01 DIAGNOSIS — Z01419 Encounter for gynecological examination (general) (routine) without abnormal findings: Secondary | ICD-10-CM | POA: Insufficient documentation

## 2023-02-01 DIAGNOSIS — Z124 Encounter for screening for malignant neoplasm of cervix: Secondary | ICD-10-CM | POA: Diagnosis not present

## 2023-02-01 DIAGNOSIS — F3181 Bipolar II disorder: Secondary | ICD-10-CM

## 2023-02-01 DIAGNOSIS — I820 Budd-Chiari syndrome: Secondary | ICD-10-CM

## 2023-02-01 MED ORDER — CARIPRAZINE HCL 1.5 MG PO CAPS
1.5000 mg | ORAL_CAPSULE | Freq: Every day | ORAL | Status: DC
Start: 2023-02-01 — End: 2023-05-03

## 2023-02-01 NOTE — Patient Instructions (Signed)
I will let you know about your labs when I have reviewed them. Everything looked great on your exam today!!  Follow the psychiatrists orders for the Vraylar. I have given you samples today to get you through until the pharmacy has the medication in stock.   WEIGHT LOSS PLANNING Your progress today shows:     02/01/2023    8:50 AM 01/29/2023    8:14 AM 01/21/2023    1:00 AM  Vitals with BMI  Height 5\' 5"  5\' 4"    Weight 194 lbs 6 oz 195 lbs   BMI 32.35 33.46   Systolic 124 93 110  Diastolic 78 50 78  Pulse 80 78 77    For best management of weight, it is vital to balance intake versus output. This means the number of calories burned per day must be less than the calories you take in with food and drink.   I recommend trying to follow a diet with the following: Calories: 1200-1500 calories per day Carbohydrates: 150-180 grams of carbohydrates per day  Why: Gives your body enough "quick fuel" for cells to maintain normal function without sending them into starvation mode.  Protein: At least 90 grams of protein per day- 30 grams with each meal Why: Protein takes longer and uses more energy than carbohydrates to break down for fuel. The carbohydrates in your meals serves as quick energy sources and proteins help use some of that extra quick energy to break down to produce long term energy. This helps you not feel hungry as quickly and protein breakdown burns calories.  Water: Drink AT LEAST 64 ounces of water per day  Why: Water is essential to healthy metabolism. Water helps to fill the stomach and keep you fuller longer. Water is required for healthy digestion and filtering of waste in the body.  Fat: Limit fats in your diet- when choosing fats, choose foods with lower fats content such as lean meats (chicken, fish, [place]).  Why: Increased fat intake leads to storage "for later". Once you burn your carbohydrate energy, your body goes into fat and protein breakdown mode to help you loose  weight.  Cholesterol: Fats and oils that are LIQUID at room temperature are best. Choose vegetable oils (olive oil, avocado oil, nuts). Avoid fats that are SOLID at room temperature (animal fats, processed meats). Healthy fats are often found in whole grains, beans, nuts, seeds, and berries.  Why: Elevated cholesterol levels lead to build up of cholesterol on the inside of your blood vessels. This will eventually cause the blood vessels to become hard and can lead to high blood pressure and damage to your organs. When the blood flow is reduced, but the pressure is high from cholesterol buildup, parts of the cholesterol can break off and form clots that can go to the brain or heart leading to a stroke or heart attack.  Fiber: Increase amount of SOLUBLE the fiber in your diet. This helps to fill you up, lowers cholesterol, and helps with digestion. Some foods high in soluble fiber are oats, peas, beans, apples, carrots, barley, and citrus fruits.   Why: Fiber fills you up, helps remove excess cholesterol, and aids in healthy digestion which are all very important in weight management.   I recommend the following as a minimum activity routine: Purposeful walk or other physical activity at least 20 minutes every single day. This means purposefully taking a walk, jog, bike, swim, treadmill, elliptical, dance, etc.  This activity should be ABOVE your normal  daily activities, such as walking at work. Goal exercise should be at least 150 minutes a week- work your way up to this.   Heart Rate: Your maximum exercise heart rate should be 220 - Your Age in Years. When exercising, get your heart rate up, but avoid going over the maximum targeted heart rate.  60-70% of your maximum heart rate is where you tend to burn the most fat. To find this number:  220 - Age In Years= Max HR  Max HR x 0.6 (or 0.7) = Fat Burning HR The Fat Burning HR is your goal heart rate while working out to burn the most fat.  NEVER  exercise to the point your feel lightheaded, weak, nauseated, dizzy. If you experience ANY of these symptoms- STOP exercise! Allow yourself to cool down and your heart rate to come down. Then restart slower next time.  If at ANY TIME you feel chest pain or chest pressure during exercise, STOP IMMEDIATELY and seek medical attention.

## 2023-02-01 NOTE — Progress Notes (Signed)
Evelyn Clamp, DNP, AGNP-c [facility] [address] [place], [place] [address] Main Office [phone]  BP 124/78   Pulse 80   Ht 5\' 5"  (1.651 m)   Wt 194 lb 6.4 oz (88.2 kg)   BMI 32.35 kg/m    Subjective:    Patient ID: [name], female    DOB: February 02, 1981, 42 y.o.   MRN: [id number]  HPI: [name] is a 42 y.o. female presenting on 02/01/2023 for comprehensive medical examination.   History of Present Illness [name], a 42 year old female, presents for a routine physical exam. 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 The exact extent of the hysterectomy is unclear, but the patient recalls retaining her ovaries. She is unsure if her cervix was removed and has not had a Pap smear since the procedure.  The patient reports a recent change in soap causing some genital irritation, but denies any discharge. She also mentions a change in bowel habits, attributing it to the use of magnesium. She has been making dietary changes, reducing sugar intake, and reports feeling better and having more energy as a result.  The patient is also under psychiatric care and is in the process of transitioning medications. She is weaning off Telexa and is due to start Vraylar, a process that is causing her some anxiety. She has used [employer] in the past but was on other medications at the same time, making it difficult for her to assess its effectiveness.  The patient has a history of shaving causing skin irritation and bumps, and she is considering stopping this practice. She also mentions a recent accident that caused a wound, which is now healing and shrinking.  The patient is concerned about her cholesterol and blood sugar levels and is making efforts to improve her diet and exercise more. She requests these levels to be checked.  Pertinent items are noted in  HPI.  IMMUNIZATIONS:   Flu Vaccine: Flu vaccine given today Prevnar 13: Prevnar 13 N/A for this patient Prevnar 20: Prevnar 20 N/A for this patient Pneumovax 23: Pneumovax 23 N/A for this patient Vac Shingrix: Shingrix N/A for this patient HPV: N/A or Aged Out Tetanus: Tetanus completed in the last 10 years COVID: Declined today. Information on where to obtain the vaccine was provided.  RSV: No  HEALTH MAINTENANCE: Pap Smear HM Status: was completed today Mammogram HM Status: N/A Colon Cancer Screening HM Status: is up to date Bone Density HM Status: N/A STI Testing HM Status: is up to date Lung CT HM Status: N/A  Concerns with vision, hearing, or dentition: No  Most Recent Depression Screen:     09/19/2022    1:31 PM 08/14/2022   11:48 AM 11/03/2021    8:59 AM  Depression screen PHQ 2/9  Decreased Interest 0 1 1  Down, Depressed, Hopeless 2 2 1   PHQ - 2 Score 2 3 2   Altered sleeping 0 2 2  Tired, decreased energy 0 3 2  Change in appetite 0 1 2  Feeling bad or failure about yourself  3 2 1   Trouble concentrating 0 2 3  Moving slowly or fidgety/restless 0 3 3  Suicidal thoughts 0 1 0  PHQ-9 Score 5 17 15   Difficult doing work/chores Somewhat difficult Very difficult Extremely dIfficult   Most Recent Anxiety Screen:     10/10/2022    1:41 PM 11/03/2021    9:01  AM  GAD 7 : Generalized Anxiety Score  Nervous, Anxious, on Edge 3 1  Control/stop worrying 0 1  Worry too much - different things 1 1  Trouble relaxing 1 3  Restless 2 3  Easily annoyed or irritable 1 3  Afraid - awful might happen 2 0  Total GAD 7 Score 10 12  Anxiety Difficulty Somewhat difficult Extremely difficult   Most Recent Fall Screen:    09/19/2022    1:30 PM 12/08/2021    3:29 PM 11/03/2021    8:59 AM  Fall Risk   Falls in the past year? 0 0 0  Number falls in past yr: 0 0 0  Injury with Fall? 0 0 0  Risk for fall due to : Medication side effect No Fall Risks No Fall Risks  Follow up  Falls prevention discussed;Education provided;Falls evaluation completed Falls evaluation completed;Education provided Falls evaluation completed    Past medical history, surgical history, medications, allergies, family history and social history reviewed with patient today and changes made to appropriate areas of the chart.  Past Medical History:  Past Medical History:  Diagnosis Date   Acute vaginitis 03/31/2022   Anemia    h/o   Anginal pain (HCC)    pt. takes nitrostat- as needed, hasn't had since 04/2012, sees Dr. 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 2013   Neuromuscular disorder (HCC)    muscle spasms-back & arms    SVT (supraventricular tachycardia) (HCC) 01/22/2012   UTI (lower urinary tract infection) 01/20/2012   Medications:  Current Outpatient Medications on File Prior to Visit  Medication Sig   albuterol (VENTOLIN HFA) 108 (90 Base) MCG/ACT inhaler Inhale 1-2 puffs into the lungs every 6 (six) hours as needed for wheezing or shortness of breath.   aspirin-acetaminophen-caffeine (EXCEDRIN MIGRAINE) 250-250-65 MG tablet Take 2 tablets by mouth every 6 (six) hours as needed for headache.   ibuprofen (ADVIL) 800 MG tablet TAKE 1 TABLET BY MOUTH 2 TIMES DAILY AS NEEDED.   omeprazole (PRILOSEC) 40 MG capsule TAKE 1 CAPSULE (40 MG TOTAL) BY MOUTH DAILY.   No current facility-administered medications on file prior to visit.   Surgical History:  Past Surgical History:  Procedure Laterality Date   ABDOMINAL HYSTERECTOMY     PERIPHERALLY INSERTED CENTRAL CATHETER INSERTION     during event of Meningitis- then d/c'd to home /w & treated /w antibiotics    SUBOCCIPITAL  CRANIECTOMY CERVICAL LAMINECTOMY N/A 07/22/2012   Procedure: SUBOCCIPITAL CRANIECTOMY CERVICAL LAMINECTOMY/DURAPLASTY;  Surgeon: Hewitt Shorts, MD;  Location: [facility];  Service: Neurosurgery;  Laterality: N/A;  Suboccipital craniectomy with upper cervial laminectomy with duroplasty    TUBAL LIGATION     VAGINAL DELIVERY     x3   WISDOM TOOTH EXTRACTION     Allergies:  Allergies  Allergen Reactions   Amoxicillin Anaphylaxis and Hives    Has patient had a PCN reaction causing immediate rash, facial/tongue/throat swelling, SOB or lightheadedness with hypotension: Yes Has patient had a PCN reaction causing severe rash involving mucus membranes or skin necrosis: Yes Has patient had a PCN reaction that required hospitalization No  Has patient had a PCN reaction occurring within the last 10 years: No If all of the above answers are "NO", then may proceed with Cephalosporin use.    Imitrex [Sumatriptan Base] Anaphylaxis and Hives   Penicillins Anaphylaxis and Hives    Has patient had a PCN reaction causing immediate rash, facial/tongue/throat swelling, SOB or lightheadedness with hypotension: Yes Has patient had a PCN reaction causing severe rash involving mucus membranes or skin necrosis: Yes Has patient had a PCN reaction that required hospitalization No Has patient had a PCN reaction occurring within the last 10 years: No If all of the above answers are "NO", then may proceed with Cephalosporin use.    Tramadol Shortness Of Breath and Palpitations   Abilify [Aripiprazole]     seizures   Norco [Hydrocodone-Acetaminophen] Other (See Comments)    Hallucinations    Percocet [Oxycodone-Acetaminophen] Other (See Comments)    Hallucinations    Soy Allergy Hives   Family History:  Family History  Problem Relation Age of Onset   Heart attack Mother    Healthy Father        Objective:    BP 124/78   Pulse 80   Ht 5\' 5"  (1.651 m)   Wt 194 lb 6.4 oz (88.2 kg)   BMI 32.35 kg/m    Wt Readings from Last 3 Encounters:  02/01/23 194 lb 6.4 oz (88.2 kg)  01/29/23 195 lb (88.5 kg)  01/20/23 193 lb 12.6 oz (87.9 kg)    Physical Exam Vitals and nursing note reviewed. Exam conducted with a chaperone present.  Constitutional:      General: She is not in acute distress.    Appearance: Normal appearance.  HENT:     Head: Normocephalic and atraumatic.     Right Ear: Hearing, tympanic membrane, ear canal and external ear normal.     Left Ear: Hearing, tympanic membrane, ear canal and external ear normal.     Nose: Nose normal.     Right Sinus: No maxillary sinus tenderness or frontal sinus tenderness.     Left Sinus: No maxillary sinus tenderness or frontal sinus tenderness.     Mouth/Throat:     Lips: Pink.     Mouth: Mucous membranes are moist.     Pharynx: Oropharynx is clear.  Eyes:     General: Lids are normal. Vision grossly intact.     Extraocular Movements: Extraocular movements intact.     Conjunctiva/sclera: Conjunctivae normal.     Pupils: Pupils are equal, round, and reactive to light.     Funduscopic exam:    Right eye: No hemorrhage. Red reflex present.        Left eye: No hemorrhage. Red reflex present.    Visual Fields: Right eye visual fields normal and left eye visual fields normal.  Neck:     Thyroid: No thyromegaly.     Vascular: No carotid bruit.  Cardiovascular:     Rate and Rhythm: Normal rate and regular rhythm.     Chest Wall: PMI is not displaced.     Pulses: Normal pulses.     Heart sounds: Normal heart sounds. No murmur heard. Pulmonary:     Effort: Pulmonary effort is normal. No respiratory distress.     Breath sounds: Normal breath sounds.  Chest:     Chest wall: No mass, deformity or tenderness.  Breasts:    Breasts are symmetrical.     Right: Normal.     Left: Normal.  Abdominal:  General: Bowel sounds are normal. There is no distension or abdominal bruit.     Palpations: Abdomen is soft. There is no hepatomegaly,  splenomegaly or mass.     Tenderness: There is no abdominal tenderness. There is no right CVA tenderness, left CVA tenderness or guarding.     Hernia: No hernia is present. There is no hernia in the left inguinal area or right inguinal area.  Genitourinary:    General: Normal vulva.     Exam position: Lithotomy position.     Pubic Area: No rash.      Tanner stage (genital): 5.     Labia:        Right: No rash or tenderness.        Left: No rash.      Urethra: No prolapse or urethral swelling.     Vagina: Normal. No vaginal discharge.     Uterus: Normal.      Adnexa: Right adnexa normal and left adnexa normal.     Rectum: Normal.     Comments: Cervix not present. Post hysterectomy 2009. Musculoskeletal:        General: Normal range of motion.     Cervical back: Full passive range of motion without pain, normal range of motion and neck supple. No tenderness.     Right lower leg: No edema.     Left lower leg: No edema.  Feet:     Right foot:     Skin integrity: Skin integrity normal.     Toenail Condition: Right toenails are normal.     Left foot:     Skin integrity: Skin integrity normal.     Toenail Condition: Left toenails are normal.  Lymphadenopathy:     Cervical: No cervical adenopathy.     Upper Body:     Right upper body: No supraclavicular, axillary or pectoral adenopathy.     Left upper body: No supraclavicular, axillary or pectoral adenopathy.     Lower Body: No right inguinal adenopathy.  Skin:    General: Skin is warm and dry.     Capillary Refill: Capillary refill takes less than 2 seconds.     Nails: There is no clubbing.  Neurological:     General: No focal deficit present.     Mental Status: She is alert and oriented to person, place, and time.     Cranial Nerves: No cranial nerve deficit.     Sensory: Sensation is intact. No sensory deficit.     Motor: Motor function is intact. No weakness.     Coordination: Coordination is intact. Coordination normal.      Gait: Gait is intact.  Psychiatric:        Attention and Perception: Attention normal.        Mood and Affect: Mood normal.        Speech: Speech normal.        Behavior: Behavior normal. Behavior is cooperative.        Thought Content: Thought content normal.        Cognition and Memory: Cognition and memory normal.        Judgment: Judgment normal.      Results for orders placed or performed in visit on 02/01/23  CBC with Differential/Platelet  Result Value Ref Range   WBC 5.1 3.4 - 10.8 x10E3/uL   RBC 4.29 3.77 - 5.28 x10E6/uL   Hemoglobin 12.6 11.1 - 15.9 g/dL   Hematocrit 40.9 81.1 - 46.6 %   MCV 88  79 - 97 fL   MCH 29.4 26.6 - 33.0 pg   MCHC 33.3 31.5 - 35.7 g/dL   RDW 78.2 95.6 - 21.3 %   Platelets 306 150 - 450 x10E3/uL   Neutrophils 52 Not Estab. %   Lymphs 35 Not Estab. %   Monocytes 8 Not Estab. %   Eos 4 Not Estab. %   Basos 1 Not Estab. %   Neutrophils Absolute 2.7 1.4 - 7.0 x10E3/uL   Lymphocytes Absolute 1.8 0.7 - 3.1 x10E3/uL   Monocytes Absolute 0.4 0.1 - 0.9 x10E3/uL   EOS (ABSOLUTE) 0.2 0.0 - 0.4 x10E3/uL   Basophils Absolute 0.0 0.0 - 0.2 x10E3/uL   Immature Granulocytes 0 Not Estab. %   Immature Grans (Abs) 0.0 0.0 - 0.1 x10E3/uL  CMP14+EGFR  Result Value Ref Range   Glucose 97 70 - 99 mg/dL   BUN 4 (L) 6 - 24 mg/dL   Creatinine, Ser 0.86 0.57 - 1.00 mg/dL   eGFR 578 >46 NG/EXB/2.84   BUN/Creatinine Ratio 7 (L) 9 - 23   Sodium 138 134 - 144 mmol/L   Potassium 4.0 3.5 - 5.2 mmol/L   Chloride 105 96 - 106 mmol/L   CO2 20 20 - 29 mmol/L   Calcium 9.2 8.7 - 10.2 mg/dL   Total Protein 6.9 6.0 - 8.5 g/dL   Albumin 4.0 3.9 - 4.9 g/dL   Globulin, Total 2.9 1.5 - 4.5 g/dL   Bilirubin Total 0.4 0.0 - 1.2 mg/dL   Alkaline Phosphatase 89 44 - 121 IU/L   AST 15 0 - 40 IU/L   ALT 11 0 - 32 IU/L  Hemoglobin A1c  Result Value Ref Range   Hgb A1c MFr Bld 5.7 (H) 4.8 - 5.6 %   Est. average glucose Bld gHb Est-mCnc 117 mg/dL  Lipid panel  Result Value  Ref Range   Cholesterol, Total 152 100 - 199 mg/dL   Triglycerides 77 0 - 149 mg/dL   HDL 67 >13 mg/dL   VLDL Cholesterol Cal 15 5 - 40 mg/dL   LDL Chol Calc (NIH) 70 0 - 99 mg/dL   Chol/HDL Ratio 2.3 0.0 - 4.4 ratio  TSH  Result Value Ref Range   TSH 1.600 0.450 - 4.500 uIU/mL  VITAMIN D 25 Hydroxy (Vit-D Deficiency, Fractures)  Result Value Ref Range   Vit D, 25-Hydroxy 19.4 (L) 30.0 - 100.0 ng/mL  Cytology - PAP  Result Value Ref Range   High risk HPV Negative    Neisseria Gonorrhea Negative    Chlamydia Negative    Trichomonas Negative    Adequacy Satisfactory for evaluation.    Diagnosis      - Negative for intraepithelial lesion or malignancy (NILM)   Comment Normal Reference Range HPV - Negative    Comment Normal Reference Range Trichomonas - Negative    Comment Normal Reference Ranger Chlamydia - Negative    Comment      Normal Reference Range Neisseria Gonorrhea - Negative       Assessment & Plan:   Problem List Items Addressed This Visit     Folliculitis    Chronic. Likely related to shaving the perineal region. No significant irritation or inflammation noted on examination.  -Consider avoiding removal of hair in the genital region to reduce chances of inflammation of the hair follicles.  -May consider trial of electric razor to see if this helps reduce the bumps.      Encounter for annual physical exam - Primary  CPE completed today. Review of HM activities and recommendations discussed and provided on AVS. Anticipatory guidance, diet, and exercise recommendations provided. Medications, allergies, and hx reviewed and updated as necessary. Orders placed as listed below.  Plan: - Labs ordered. Will make changes as necessary based on results.  - I will review these results and send recommendations via MyChart or a telephone call.  - F/U with CPE in 1 year or sooner for acute/chronic health needs as directed.        Relevant Orders   CBC with  Differential/Platelet (Completed)   CMP14+EGFR (Completed)   Hemoglobin A1c (Completed)   Lipid panel (Completed)   TSH (Completed)   Cytology - PAP (Completed)   VITAMIN D 25 Hydroxy (Vit-D Deficiency, Fractures) (Completed)   Papanicolaou smear for cervical cancer screening    Hysterectomy 2009. Pap completed today with no evidence of remaining cervix. Will complete PAP and discontinue if findings are normal.       Relevant Orders   Cytology - PAP (Completed)   Bipolar 2 disorder (HCC)    Patient is starting Vraylar and weaning off Celexa under the guidance of a psychiatrist. - Provided Vraylar samples due to pharmacy stock issue. She will continue to follow under the care of psychiatry for this concern, but I am happy to help coordinate, if needed.  - Encourage patient to communicate with our office if there are issues obtaining the medication.       Relevant Medications   cariprazine (VRAYLAR) 1.5 MG capsule   Budd-Chiari syndrome (HCC)    No concerns present at this time.       Other Visit Diagnoses     Need for influenza vaccination       Relevant Orders   Flu vaccine trivalent PF, 6mos and older(Flulaval,Afluria,Fluarix,Fluzone) (Completed)   Routine screening for STI (sexually transmitted infection)       Relevant Orders   Cytology - PAP (Completed)   Screening for endocrine, nutritional, metabolic and immunity disorder       Relevant Orders   CBC with Differential/Platelet (Completed)   CMP14+EGFR (Completed)   Hemoglobin A1c (Completed)   Lipid panel (Completed)   TSH (Completed)   Cytology - PAP (Completed)   VITAMIN D 25 Hydroxy (Vit-D Deficiency, Fractures) (Completed)         Follow up plan: Return in about 1 year (around 02/01/2024) for CPE.  NEXT PREVENTATIVE PHYSICAL DUE IN 1 YEAR.  PATIENT COUNSELING PROVIDED FOR ALL ADULT PATIENTS: A well balanced diet low in saturated fats, cholesterol, and moderation in carbohydrates.  This can be as simple  as monitoring portion sizes and cutting back on sugary beverages such as soda and juice to start with.    Daily water consumption of at least 64 ounces.  Physical activity at least 180 minutes per week.  If just starting out, start 10 minutes a day and work your way up.   This can be as simple as taking the stairs instead of the elevator and walking 2-3 laps around the office  purposefully every day.   STD protection, partner selection, and regular testing if high risk.  Limited consumption of alcoholic beverages if alcohol is consumed. For men, I recommend no more than 14 alcoholic beverages per week, spread out throughout the week (max 2 per day). Avoid "binge" drinking or consuming large quantities of alcohol in one setting.  Please let me know if you feel you may need help with reduction or quitting alcohol consumption.  Avoidance of nicotine, if used. Please let me know if you feel you may need help with reduction or quitting nicotine use.   Daily mental health attention. This can be in the form of 5 minute daily meditation, prayer, journaling, yoga, reflection, etc.  Purposeful attention to your emotions and mental state can significantly improve your overall wellbeing  and  Health.  Please know that I am here to help you with all of your health care goals and am happy to work with you to find a solution that works best for you.  The greatest advice I have received with any changes in life are to take it one step at a time, that even means if all you can focus on is the next 60 seconds, then do that and celebrate your victories.  With any changes in life, you will have set backs, and that is OK. The important thing to remember is, if you have a set back, it is not a failure, it is an opportunity to try again! Screening Testing Mammogram Every 1 -2 years based on history and risk factors Starting at age 74 Pap Smear Ages 21-39 every 3 years Ages 48-65 every 5 years with HPV  testing More frequent testing may be required based on results and history Colon Cancer Screening Every 1-10 years based on test performed, risk factors, and history Starting at age 65 Bone Density Screening Every 2-10 years based on history Starting at age 55 for women Recommendations for men differ based on medication usage, history, and risk factors AAA Screening One time ultrasound Men 27-42 years old who have every smoked Lung Cancer Screening Low Dose Lung CT every 12 months Age 24-80 years with a 30 pack-year smoking history who still smoke or who have quit within the last 15 years   Screening Labs Routine  Labs: Complete Blood Count (CBC), Complete Metabolic Panel (CMP), Cholesterol (Lipid Panel) Every 6-12 months based on history and medications May be recommended more frequently based on current conditions or previous results Hemoglobin A1c Lab Every 3-12 months based on history and previous results Starting at age 63 or earlier with diagnosis of diabetes, high cholesterol, BMI >26, and/or risk factors Frequent monitoring for patients with diabetes to ensure blood sugar control Thyroid Panel (TSH) Every 6 months based on history, symptoms, and risk factors May be repeated more often if on medication HIV One time testing for all patients 34 and older May be repeated more frequently for patients with increased risk factors or exposure Hepatitis C One time testing for all patients 37 and older May be repeated more frequently for patients with increased risk factors or exposure Gonorrhea, Chlamydia Every 12 months for all sexually active persons 13-24 years Additional monitoring may be recommended for those who are considered high risk or who have symptoms Every 12 months for any woman on birth control, regardless of sexual activity PSA Men 87-20 years old with risk factors Additional screening may be recommended from age 88-69 based on risk factors, symptoms, and  history  Vaccine Recommendations Tetanus Booster All adults every 10 years Flu Vaccine All patients 6 months and older every year COVID Vaccine All patients 12 years and older Initial dosing with booster May recommend additional booster based on age and health history HPV Vaccine 2 doses all patients age 58-26 Dosing may be considered for patients over 26 Shingles Vaccine (Shingrix) 2 doses all adults 55 years and older Pneumonia (Pneumovax 23) All adults 65 years and older May  recommend earlier dosing based on health history One year apart from Prevnar 13 Pneumonia (Prevnar 54) All adults 65 years and older Dosed 1 year after Pneumovax 23 Pneumonia (Prevnar 20) One time alternative to the two dosing of 13 and 23 For all adults with initial dose of 23, 20 is recommended 1 year later For all adults with initial dose of 13, 23 is still recommended as second option 1 year later

## 2023-02-02 LAB — CBC WITH DIFFERENTIAL/PLATELET
Basophils Absolute: 0 10*3/uL (ref 0.0–0.2)
Basos: 1 %
EOS (ABSOLUTE): 0.2 10*3/uL (ref 0.0–0.4)
Eos: 4 %
Hematocrit: 37.8 % (ref 34.0–46.6)
Hemoglobin: 12.6 g/dL (ref 11.1–15.9)
Immature Grans (Abs): 0 10*3/uL (ref 0.0–0.1)
Immature Granulocytes: 0 %
Lymphocytes Absolute: 1.8 10*3/uL (ref 0.7–3.1)
Lymphs: 35 %
MCH: 29.4 pg (ref 26.6–33.0)
MCHC: 33.3 g/dL (ref 31.5–35.7)
MCV: 88 fL (ref 79–97)
Monocytes Absolute: 0.4 10*3/uL (ref 0.1–0.9)
Monocytes: 8 %
Neutrophils Absolute: 2.7 10*3/uL (ref 1.4–7.0)
Neutrophils: 52 %
Platelets: 306 10*3/uL (ref 150–450)
RBC: 4.29 x10E6/uL (ref 3.77–5.28)
RDW: 12.8 % (ref 11.7–15.4)
WBC: 5.1 10*3/uL (ref 3.4–10.8)

## 2023-02-02 LAB — CMP14+EGFR
ALT: 11 [IU]/L (ref 0–32)
AST: 15 [IU]/L (ref 0–40)
Albumin: 4 g/dL (ref 3.9–4.9)
Alkaline Phosphatase: 89 [IU]/L (ref 44–121)
BUN/Creatinine Ratio: 7 — ABNORMAL LOW (ref 9–23)
BUN: 4 mg/dL — ABNORMAL LOW (ref 6–24)
Bilirubin Total: 0.4 mg/dL (ref 0.0–1.2)
CO2: 20 mmol/L (ref 20–29)
Calcium: 9.2 mg/dL (ref 8.7–10.2)
Chloride: 105 mmol/L (ref 96–106)
Creatinine, Ser: 0.59 mg/dL (ref 0.57–1.00)
Globulin, Total: 2.9 g/dL (ref 1.5–4.5)
Glucose: 97 mg/dL (ref 70–99)
Potassium: 4 mmol/L (ref 3.5–5.2)
Sodium: 138 mmol/L (ref 134–144)
Total Protein: 6.9 g/dL (ref 6.0–8.5)
eGFR: 115 mL/min/{1.73_m2} (ref >59)

## 2023-02-02 LAB — VITAMIN D 25 HYDROXY (VIT D DEFICIENCY, FRACTURES): Vit D, 25-Hydroxy: 19.4 ng/mL — ABNORMAL LOW (ref 30.0–100.0)

## 2023-02-02 LAB — LIPID PANEL
Chol/HDL Ratio: 2.3 {ratio} (ref 0.0–4.4)
Cholesterol, Total: 152 mg/dL (ref 100–199)
HDL: 67 mg/dL (ref >39)
LDL Chol Calc (NIH): 70 mg/dL (ref 0–99)
Triglycerides: 77 mg/dL (ref 0–149)
VLDL Cholesterol Cal: 15 mg/dL (ref 5–40)

## 2023-02-02 LAB — TSH: TSH: 1.6 u[IU]/mL (ref 0.450–4.500)

## 2023-02-02 LAB — HEMOGLOBIN A1C
Est. average glucose Bld gHb Est-mCnc: 117 mg/dL
Hgb A1c MFr Bld: 5.7 % — ABNORMAL HIGH (ref 4.8–5.6)

## 2023-02-05 LAB — CYTOLOGY - PAP
Chlamydia: NEGATIVE
Comment: NEGATIVE
Comment: NEGATIVE
Comment: NEGATIVE
Comment: NORMAL
Diagnosis: NEGATIVE
High risk HPV: NEGATIVE
Neisseria Gonorrhea: NEGATIVE
Trichomonas: NEGATIVE

## 2023-02-19 ENCOUNTER — Encounter: Payer: Self-pay | Admitting: Nurse Practitioner

## 2023-02-19 DIAGNOSIS — Z124 Encounter for screening for malignant neoplasm of cervix: Secondary | ICD-10-CM | POA: Insufficient documentation

## 2023-02-19 DIAGNOSIS — Z0000 Encounter for general adult medical examination without abnormal findings: Secondary | ICD-10-CM | POA: Insufficient documentation

## 2023-02-19 DIAGNOSIS — I820 Budd-Chiari syndrome: Secondary | ICD-10-CM | POA: Insufficient documentation

## 2023-02-19 HISTORY — DX: Encounter for screening for malignant neoplasm of cervix: Z12.4

## 2023-02-19 NOTE — Assessment & Plan Note (Signed)

## 2023-02-19 NOTE — Assessment & Plan Note (Signed)
Hysterectomy 2009. Pap completed today with no evidence of remaining cervix. Will complete PAP and discontinue if findings are normal.

## 2023-02-19 NOTE — Assessment & Plan Note (Signed)
No concerns present at this time.

## 2023-02-19 NOTE — Assessment & Plan Note (Signed)
Chronic. Likely related to shaving the perineal region. No significant irritation or inflammation noted on examination.  -Consider avoiding removal of hair in the genital region to reduce chances of inflammation of the hair follicles.  -May consider trial of electric razor to see if this helps reduce the bumps.

## 2023-02-19 NOTE — Assessment & Plan Note (Signed)
Patient is starting Vraylar and weaning off Celexa under the guidance of a psychiatrist. - Provided Vraylar samples due to pharmacy stock issue. She will continue to follow under the care of psychiatry for this concern, but I am happy to help coordinate, if needed.  - Encourage patient to communicate with our office if there are issues obtaining the medication.

## 2023-02-28 ENCOUNTER — Other Ambulatory Visit: Payer: Self-pay | Admitting: Nurse Practitioner

## 2023-03-16 ENCOUNTER — Other Ambulatory Visit (HOSPITAL_BASED_OUTPATIENT_CLINIC_OR_DEPARTMENT_OTHER): Payer: Self-pay | Admitting: Nurse Practitioner

## 2023-03-16 DIAGNOSIS — F411 Generalized anxiety disorder: Secondary | ICD-10-CM

## 2023-03-16 DIAGNOSIS — F339 Major depressive disorder, recurrent, unspecified: Secondary | ICD-10-CM

## 2023-05-03 ENCOUNTER — Other Ambulatory Visit: Payer: Self-pay | Admitting: Nurse Practitioner

## 2023-05-03 ENCOUNTER — Encounter: Payer: Self-pay | Admitting: Nurse Practitioner

## 2023-05-03 ENCOUNTER — Ambulatory Visit (INDEPENDENT_AMBULATORY_CARE_PROVIDER_SITE_OTHER): Payer: 59 | Admitting: Nurse Practitioner

## 2023-05-03 VITALS — BP 122/76 | HR 88 | Wt 200.6 lb

## 2023-05-03 DIAGNOSIS — K582 Mixed irritable bowel syndrome: Secondary | ICD-10-CM

## 2023-05-03 DIAGNOSIS — K219 Gastro-esophageal reflux disease without esophagitis: Secondary | ICD-10-CM

## 2023-05-03 DIAGNOSIS — N951 Menopausal and female climacteric states: Secondary | ICD-10-CM

## 2023-05-03 DIAGNOSIS — K2900 Acute gastritis without bleeding: Secondary | ICD-10-CM | POA: Diagnosis not present

## 2023-05-03 MED ORDER — DICYCLOMINE HCL 20 MG PO TABS: 20.0000 mg | ORAL_TABLET | Freq: Three times a day (TID) | ORAL | 3 refills | Status: DC | PRN

## 2023-05-03 MED ORDER — PANTOPRAZOLE SODIUM 40 MG PO TBEC: 40.0000 mg | DELAYED_RELEASE_TABLET | Freq: Every day | ORAL | 3 refills | Status: DC

## 2023-05-03 MED ORDER — PANTOPRAZOLE SODIUM 40 MG PO TBEC
DELAYED_RELEASE_TABLET | ORAL | 0 refills | Status: DC
Start: 2023-05-03 — End: 2023-08-07

## 2023-05-03 NOTE — Progress Notes (Signed)
 Camie FORBES Doing, DNP, AGNP-c [facility] [address] [place], [place] [address] [phone]   ACUTE VISIT- ESTABLISHED PATIENT  Blood pressure 122/76, pulse 88, weight 200 lb 9.6 oz (91 kg).  Subjective:  HPI [name] is a 43 y.o. female presents to day for evaluation of acute concern(s).   [name] presents with a chief complaint of abdominal pain and bloating, which she describes as a burning sensation that is tender to touch. The pain has been ongoing for four days and is associated with changes in bowel habits, including a single day of diarrhea followed by constipation. The patient also reports the presence of mucus in her stools. She has been experiencing nausea but denies any episodes of vomiting.  The patient has been attempting to manage her symptoms with over-the-counter medications, including Pepto-Bismol, which she believes may have contributed to her constipation. She also reports a recent increase in her coffee intake, consuming up to three cups a day, which she suspects may have exacerbated her symptoms.  In addition to her gastrointestinal symptoms, the patient reports difficulty losing weight despite efforts to reduce her food intake. She has noticed an increase in abdominal girth, which she attributes to bloating. She also reports occasional palpitations and hot flashes, particularly at night.  The patient has a history of a hysterectomy, with removal of the ovaries and cervix. She is not currently taking any prescribed medications for acid reflux, although she has taken omeprazole  in the past. The patient is due to start a new job soon and is eager to manage her symptoms effectively.  ROS negative except for what is listed in HPI. History, Medications, Surgery, SDOH, and Family History reviewed and updated as appropriate.  Objective:  Physical Exam Vitals and nursing note reviewed.  Constitutional:      General: She is not in acute  distress.    Appearance: Normal appearance. She is not ill-appearing.  HENT:     Head: Normocephalic.  Eyes:     Conjunctiva/sclera: Conjunctivae normal.  Cardiovascular:     Rate and Rhythm: Normal rate and regular rhythm.     Pulses: Normal pulses.     Heart sounds: Normal heart sounds.  Pulmonary:     Effort: Pulmonary effort is normal.     Breath sounds: Normal breath sounds.  Abdominal:     General: Bowel sounds are normal. There is no distension.     Palpations: Abdomen is soft.     Tenderness: There is abdominal tenderness in the epigastric area. There is no right CVA tenderness, left CVA tenderness or guarding.  Neurological:     Mental Status: She is alert.          Assessment & Plan:   Problem List Items Addressed This Visit     Gastroesophageal reflux disease   Presents with a 4-day history of burning, tender abdominal pain, exacerbated by touch. Symptoms include nausea, one-day diarrhea, and dark stools. Increased coffee intake and stress reported. Physical exam shows upper abdominal tenderness. Differential diagnosis includes GERD and gastritis. Not taking expired omeprazole . Pantoprazole  recommended due to better insurance coverage. Discussed risks of untreated gastritis (ulcers, esophageal damage) and benefits of pantoprazole  (reduced acid production, healing). Alternative: omeprazole  if insurance changes. - Prescribe pantoprazole , higher dose for two weeks, then reduce to once daily - Prescribe Bentyl  for abdominal cramping, up to three times daily as needed - Advise avoiding black coffee, fatty, and fried foods - Recommend mild foods such as rice and applesauce - Monitor  symptoms and report if no improvement in a few days - Potential referral to gastroenterologist if symptoms persist      Relevant Medications   pantoprazole  (PROTONIX ) 40 MG tablet   pantoprazole  (PROTONIX ) 40 MG tablet (Start on 05/31/2023)   dicyclomine  (BENTYL ) 20 MG tablet   Other Relevant  Orders   Ambulatory referral to Gastroenterology   Perimenopausal symptoms   Reports difficulty losing weight and possible nocturnal hot flashes. Post-hysterectomy with oophorectomy, complicating menopausal status determination. Symptoms consistent with perimenopause (weight gain, mood changes). Discussed weight gain challenges during perimenopause. Recommended small, frequent meals with balanced protein and carbohydrates. Advised against skipping meals to prevent metabolic slowdown. - Provide educational materials on perimenopause symptoms - Recommend small, frequent meals with balanced protein and carbohydrates - Advise against skipping meals to prevent metabolic slowdown      Other Visit Diagnoses       Acute gastritis without hemorrhage, unspecified gastritis type    -  Primary   Relevant Medications   pantoprazole  (PROTONIX ) 40 MG tablet   pantoprazole  (PROTONIX ) 40 MG tablet (Start on 05/31/2023)     Irritable bowel syndrome with both constipation and diarrhea       Relevant Medications   pantoprazole  (PROTONIX ) 40 MG tablet   pantoprazole  (PROTONIX ) 40 MG tablet (Start on 05/31/2023)   dicyclomine  (BENTYL ) 20 MG tablet   Other Relevant Orders   Ambulatory referral to Gastroenterology         Camie FORBES Doing, DNP, AGNP-c

## 2023-05-03 NOTE — Patient Instructions (Addendum)
 Your symptoms are consistent with a condition called Gastritis. This is most likely brought on by GERD (reflux) and increased stomach acid production.  The good news is we can treat this pretty easily.   I have sent in a medication called Pantoprazole . The is to help reduce the acid in the stomach and allow the stomach to heal.  Stay with mild foods that are not spicy or fatty/fried because this can make symptoms worse.   I also sent in a medicine called Bentyl  to help with cramping that you may have. This helps keep the intestines from spasming.   I sent the referral to GI, but if you get better and don't feel like you need to see them you can tell them you don't need to schedule.   Gastritis, Adult Gastritis is inflammation of the stomach. There are two kinds of gastritis: Acute gastritis. This kind develops suddenly. Chronic gastritis. This kind is much more common. It develops slowly and lasts for a long time. Gastritis happens when the lining of the stomach becomes weak or gets damaged. Without treatment, gastritis can lead to stomach bleeding and ulcers. What are the causes? This condition may be caused by: An infection. Drinking too much alcohol. Certain medicines. These include steroids, antibiotics, and some over-the-counter medicines, such as aspirin or ibuprofen . Having too much acid in the stomach. Having a disease of the stomach. Other causes may include: An allergic reaction. Some cancer treatments (radiation). Smoking cigarettes or the use of products that contain nicotine  or tobacco. In some cases, the cause of this condition is not known. What increases the risk? Having a disease of the intestines. Having a disease in which the body's immune system attacks the body (autoimmune disease), such as Crohn's disease. Using aspirin or ibuprofen  and other NSAIDs to treat other conditions, such as heart disease or chronic pain. Stress. What are the signs or symptoms? Symptoms  of this condition include: Pain or a burning sensation in the upper abdomen. Nausea. Vomiting. An uncomfortable feeling of fullness after eating. Weight loss. Bad breath. Blood in your vomit or stool (feces). In some cases, there are no symptoms. How is this diagnosed? This condition may be diagnosed based on your medical history, a physical exam, and tests. Tests may include: Your medical history and a description of your symptoms. A physical exam. Tests. These can include: Blood tests. Stool tests. A test in which a thin, flexible instrument with a light and a camera is passed down the esophagus and into the stomach (upper endoscopy). A test in which a tissue sample is removed to look at it under a microscope (biopsy). How is this treated? This condition may be treated with medicines. The medicines that are used vary depending on the cause of the gastritis. If the condition is caused by a bacterial infection, you may be given antibiotic medicines. If the condition is caused by too much acid in the stomach, you may be given medicines called H2 blockers, proton pump inhibitors, or antacids. Treatment may also involve stopping the use of certain medicines such as aspirin or ibuprofen  and other NSAIDs. Follow these instructions at home: Medicines Take over-the-counter and prescription medicines only as told by your health care provider. If you were prescribed an antibiotic medicine, take it as told by your health care provider. Do not stop taking the antibiotic even if you start to feel better. Alcohol use Do not drink alcohol if: Your health care provider tells you not to drink. You are  pregnant, may be pregnant, or are planning to become pregnant. If you drink alcohol: Limit your use to: 0-1 drink a day for women. 0-2 drinks a day for men. Know how much alcohol is in your drink. In the [place] one drink equals one 12 oz bottle of beer (355 mL), one 5 oz glass of wine (148 mL), or one  1 oz glass of hard liquor (44 mL). General instructions  Eat small, frequent meals instead of large meals. Avoid foods and drinks that make your symptoms worse. Talk with your health care provider about ways to manage stress, such as getting regular exercise or practicing deep breathing, meditation, or yoga. Do not use any products that contain nicotine  or tobacco. These products include cigarettes, chewing tobacco, and vaping devices, such as e-cigarettes. If you need help quitting, ask your health care provider. Drink enough fluid to keep your urine pale yellow. Keep all follow-up visits. This is important. Contact a health care provider if: Your symptoms get worse. Your abdominal pain gets worse. Your symptoms return after treatment. You have a fever. Get help right away if: You vomit blood or a substance that looks like coffee grounds. You have black or dark red stools. You are unable to keep fluids down. These symptoms may represent a serious problem that is an emergency. Do not wait to see if the symptoms will go away. Get medical help right away. Call your local emergency services (911 in the [place] Do not drive yourself to the hospital. Summary Gastritis is inflammation of the lining of the stomach that can occur suddenly (acute) or develop slowly over time (chronic). This condition is diagnosed with a medical history, a physical exam, or tests. This condition may be treated with medicines to treat infection or medicines to reduce the amount of acid in your stomach. Follow your health care provider's instructions about taking medicines, making changes to your diet, and knowing when to call for help. This information is not intended to replace advice given to you by your health care provider. Make sure you discuss any questions you have with your health care provider. Document Revised: 08/14/2020 Document Reviewed: 08/14/2020 Elsevier Patient Education  2024 [employer].

## 2023-05-09 DIAGNOSIS — N951 Menopausal and female climacteric states: Secondary | ICD-10-CM | POA: Insufficient documentation

## 2023-05-09 NOTE — Assessment & Plan Note (Signed)
 Presents with a 4-day history of burning, tender abdominal pain, exacerbated by touch. Symptoms include nausea, one-day diarrhea, and dark stools. Increased coffee intake and stress reported. Physical exam shows upper abdominal tenderness. Differential diagnosis includes GERD and gastritis. Not taking expired omeprazole . Pantoprazole  recommended due to better insurance coverage. Discussed risks of untreated gastritis (ulcers, esophageal damage) and benefits of pantoprazole  (reduced acid production, healing). Alternative: omeprazole  if insurance changes. - Prescribe pantoprazole , higher dose for two weeks, then reduce to once daily - Prescribe Bentyl  for abdominal cramping, up to three times daily as needed - Advise avoiding black coffee, fatty, and fried foods - Recommend mild foods such as rice and applesauce - Monitor symptoms and report if no improvement in a few days - Potential referral to gastroenterologist if symptoms persist

## 2023-05-09 NOTE — Assessment & Plan Note (Signed)
 Reports difficulty losing weight and possible nocturnal hot flashes. Post-hysterectomy with oophorectomy, complicating menopausal status determination. Symptoms consistent with perimenopause (weight gain, mood changes). Discussed weight gain challenges during perimenopause. Recommended small, frequent meals with balanced protein and carbohydrates. Advised against skipping meals to prevent metabolic slowdown. - Provide educational materials on perimenopause symptoms - Recommend small, frequent meals with balanced protein and carbohydrates - Advise against skipping meals to prevent metabolic slowdown

## 2023-06-02 ENCOUNTER — Encounter: Payer: Self-pay | Admitting: Nurse Practitioner

## 2023-06-20 ENCOUNTER — Encounter: Payer: Self-pay | Admitting: Gastroenterology

## 2023-06-30 ENCOUNTER — Other Ambulatory Visit: Payer: Self-pay | Admitting: Nurse Practitioner

## 2023-06-30 DIAGNOSIS — K219 Gastro-esophageal reflux disease without esophagitis: Secondary | ICD-10-CM

## 2023-06-30 DIAGNOSIS — K2900 Acute gastritis without bleeding: Secondary | ICD-10-CM

## 2023-07-05 ENCOUNTER — Other Ambulatory Visit: Payer: Self-pay | Admitting: Nurse Practitioner

## 2023-07-05 DIAGNOSIS — J4520 Mild intermittent asthma, uncomplicated: Secondary | ICD-10-CM

## 2023-08-07 ENCOUNTER — Ambulatory Visit: Payer: 59 | Admitting: Gastroenterology

## 2023-08-07 ENCOUNTER — Encounter: Payer: Self-pay | Admitting: Gastroenterology

## 2023-08-07 ENCOUNTER — Other Ambulatory Visit

## 2023-08-07 VITALS — BP 104/62 | HR 84 | Ht 64.0 in | Wt 200.2 lb

## 2023-08-07 DIAGNOSIS — R140 Abdominal distension (gaseous): Secondary | ICD-10-CM | POA: Diagnosis not present

## 2023-08-07 DIAGNOSIS — K219 Gastro-esophageal reflux disease without esophagitis: Secondary | ICD-10-CM | POA: Diagnosis not present

## 2023-08-07 DIAGNOSIS — R110 Nausea: Secondary | ICD-10-CM

## 2023-08-07 DIAGNOSIS — K2900 Acute gastritis without bleeding: Secondary | ICD-10-CM

## 2023-08-07 HISTORY — DX: Nausea: R11.0

## 2023-08-07 HISTORY — DX: Abdominal distension (gaseous): R14.0

## 2023-08-07 MED ORDER — PANTOPRAZOLE SODIUM 40 MG PO TBEC
40.0000 mg | DELAYED_RELEASE_TABLET | Freq: Every day | ORAL | 1 refills | Status: AC
Start: 2023-08-07 — End: ?

## 2023-08-07 NOTE — Progress Notes (Addendum)
 08/07/2023 KERIN KREN [id number] [birth year]   HISTORY OF PRESENT ILLNESS: This is a 43 year old female who is new to our office.  Was referred here by her PCP, Georgeanne Nim, NP, for evaluation of GERD.  She tells me that her primary complaints are bloating and nausea.  She says that her abdomen becomes very bloated every day, progressive throughout the day after eating.  Bloating is not an issue first thing in the morning.  Says that she knows that she is definitely lactose intolerant.  Says that fiber causes bloating as well.  Also reports nausea, sometimes worse in the morning, but that is often times after eating throughout the day as well.  Some heartburn and reflux, but not a lot.  Was having some lower abdominal cramping and was given dicyclomine, but says that she has not needed to use it because that has not been an issue recently.  She says that she moves her bowels fine without issues.  She did try Protonix for a few weeks earlier this year and did feel like that helped some.  Extensive labs including a TSH in the fall were on remarkable.  Takes maybe 3 ibuprofens a week, but no other NSAIDs.   Past Medical History:  Diagnosis Date   Acute vaginitis 03/31/2022   Anemia    h/o   Anginal pain (HCC)    pt. takes nitrostat- as needed, hasn't had since 04/2012, sees Dr. 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 2013   Neuromuscular disorder (HCC)    muscle spasms-back & arms    SVT (supraventricular tachycardia) (HCC) 01/22/2012   UTI (lower urinary tract infection) 01/20/2012   Past Surgical History:  Procedure Laterality Date   ABDOMINAL HYSTERECTOMY      PERIPHERALLY INSERTED CENTRAL CATHETER INSERTION     during event of Meningitis- then d/c'd to home /w & treated /w antibiotics    SUBOCCIPITAL CRANIECTOMY CERVICAL LAMINECTOMY N/A 07/22/2012   Procedure: SUBOCCIPITAL CRANIECTOMY CERVICAL LAMINECTOMY/DURAPLASTY;  Surgeon: Hewitt Shorts, MD;  Location: [facility];  Service: Neurosurgery;  Laterality: N/A;  Suboccipital craniectomy with upper cervial laminectomy with duroplasty    TUBAL LIGATION     VAGINAL DELIVERY     x3   WISDOM TOOTH EXTRACTION      reports that she has never smoked. She has never been exposed to tobacco smoke. She has never used smokeless tobacco. She reports that she does not drink alcohol and does not use drugs. family history includes Healthy in her father; Heart attack in her mother. Allergies  Allergen Reactions   Amoxicillin Anaphylaxis and Hives    Has patient had a PCN reaction causing immediate rash, facial/tongue/throat swelling, SOB or lightheadedness with hypotension: Yes Has patient had a PCN reaction causing severe rash involving mucus membranes or skin necrosis: Yes Has patient had a PCN reaction that required hospitalization No Has patient had a PCN reaction occurring within the last 10 years: No If all of the above answers are "NO", then may proceed with Cephalosporin use.    Imitrex [Sumatriptan Base] Anaphylaxis and Hives   Penicillins Anaphylaxis and Hives  Has patient had a PCN reaction causing immediate rash, facial/tongue/throat swelling, SOB or lightheadedness with hypotension: Yes Has patient had a PCN reaction causing severe rash involving mucus membranes or skin necrosis: Yes Has patient had a PCN reaction that required hospitalization No Has patient had a PCN reaction occurring within the last 10 years: No If all of the above answers are "NO", then may proceed with Cephalosporin use.    Tramadol Shortness Of Breath and Palpitations   Abilify [Aripiprazole]     seizures   Norco  [Hydrocodone-Acetaminophen] Other (See Comments)    Hallucinations    Percocet [Oxycodone-Acetaminophen] Other (See Comments)    Hallucinations    Soy Allergy (Obsolete) Hives      Outpatient Encounter Medications as of 08/07/2023  Medication Sig   albuterol (VENTOLIN HFA) 108 (90 Base) MCG/ACT inhaler INHALE 1-2 PUFFS BY MOUTH EVERY 6 HOURS AS NEEDED FOR WHEEZE OR SHORTNESS OF BREATH   dicyclomine (BENTYL) 20 MG tablet Take 1 tablet (20 mg total) by mouth 3 (three) times daily as needed for spasms (Belly cramping.).   ibuprofen (ADVIL) 800 MG tablet TAKE 1 TABLET BY MOUTH 2 TIMES DAILY AS NEEDED.   omeprazole (PRILOSEC) 40 MG capsule TAKE 1 CAPSULE (40 MG TOTAL) BY MOUTH DAILY. (Patient not taking: Reported on 08/07/2023)   pantoprazole (PROTONIX) 40 MG tablet Take one tablet by mouth every morning and one tablet by mouth every evening for 2 weeks. THEN take one tablet by mouth in the morning only every day. (Patient not taking: Reported on 08/07/2023)   pantoprazole (PROTONIX) 40 MG tablet Take 1 tablet (40 mg total) by mouth daily. (Patient not taking: Reported on 08/07/2023)   [DISCONTINUED] aspirin-acetaminophen-caffeine (EXCEDRIN MIGRAINE) 250-250-65 MG tablet Take 2 tablets by mouth every 6 (six) hours as needed for headache.   [DISCONTINUED] citalopram (CELEXA) 40 MG tablet TAKE 1 TABLET BY MOUTH EVERY DAY   No facility-administered encounter medications on file as of 08/07/2023.    REVIEW OF SYSTEMS  : All other systems reviewed and negative except where noted in the History of Present Illness.   PHYSICAL EXAM: BP 104/62   Pulse 84   Ht 5\' 4"  (1.626 m)   Wt 200 lb 4 oz (90.8 kg)   BMI 34.37 kg/m  General: Well developed female in no acute distress Head: Normocephalic and atraumatic Eyes:  Sclerae anicteric, conjunctiva pink. Ears: Normal auditory acuity Lungs: Clear throughout to auscultation; no W/R/R. Heart: Regular rate and rhythm; no M/R/G. Abdomen: Soft, non-distended.   BS present.  Non-tender. Musculoskeletal: Symmetrical with no gross deformities  Skin: No lesions on visible extremities Neurological: Alert oriented x 4, grossly non-focal Psychological:  Alert and cooperative. Normal mood and affect  ASSESSMENT AND PLAN: *43 year old female with primary complaints of bloating that is progressive throughout the day after eating and nausea also usually after eating, but sometimes first thing in the morning.  Felt like her symptoms were a little bit better when she took Protonix for a few weeks earlier this year.  Denies a lot of significant heartburn or reflux though.  I will put her back on Protonix 40 mg daily for a few months and see how things go.  Prescription sent to pharmacy.  Will check celiac labs.  We discussed a lot of diet in detail.  She is sure that she is lactose intolerant.  Says that fiber causes her bloating as well.  She was given literature on FODMAP diet after we discussed this.  I would like  her to look at that and try to make some dietary changes over the next few months.  Will see her back in 3 months for follow-up.   CC:  Early, Sara E, NP

## 2023-08-07 NOTE — Patient Instructions (Addendum)
 We have sent the following medications to your pharmacy for you to pick up at your convenience: Pantoprazole     Your provider has requested that you go to the basement level for lab work before leaving today. Press "B" on the elevator. The lab is located at the first door on the left as you exit the elevator.   Low-FODMAP Eating Plan  FODMAP stands for fermentable oligosaccharides, disaccharides, monosaccharides, and polyols. These are sugars that are hard for some people to digest. A low-FODMAP eating plan may help some people who have irritable bowel syndrome (IBS) and certain other bowel (intestinal) diseases to manage their symptoms. This meal plan can be complicated to follow. Work with a diet and nutrition specialist (dietitian) to make a low-FODMAP eating plan that is right for you. A dietitian can help make sure that you get enough nutrition from this diet. What are tips for following this plan? Reading food labels Check labels for hidden FODMAPs such as: High-fructose syrup. Honey. Agave. Natural fruit flavors. Onion or garlic powder. Choose low-FODMAP foods that contain 3-4 grams of fiber per serving. Check food labels for serving sizes. Eat only one serving at a time to make sure FODMAP levels stay low. Shopping Shop with a list of foods that are recommended on this diet and make a meal plan. Meal planning Follow a low-FODMAP eating plan for up to 6 weeks, or as told by your health care provider or dietitian. To follow the eating plan: Eliminate high-FODMAP foods from your diet completely. Choose only low-FODMAP foods to eat. You will do this for 2-6 weeks. Gradually reintroduce high-FODMAP foods into your diet one at a time. Most people should wait a few days before introducing the next new high-FODMAP food into their meal plan. Your dietitian can recommend how quickly you may reintroduce foods. Keep a daily record of what and how much you eat and drink. Make note of any  symptoms that you have after eating. Review your daily record with a dietitian regularly to identify which foods you can eat and which foods you should avoid. General tips Drink enough fluid each day to keep your urine pale yellow. Avoid processed foods. These often have added sugar and may be high in FODMAPs. Avoid most dairy products, whole grains, and sweeteners. Work with a dietitian to make sure you get enough fiber in your diet. Avoid high FODMAP foods at meals to manage symptoms. Recommended foods Fruits Bananas, oranges, tangerines, lemons, limes, blueberries, raspberries, strawberries, grapes, cantaloupe, honeydew melon, kiwi, papaya, passion fruit, and pineapple. Limited amounts of dried cranberries, banana chips, and shredded coconut. Vegetables Eggplant, zucchini, cucumber, peppers, green beans, bean sprouts, lettuce, arugula, kale, Swiss chard, spinach, collard greens, bok choy, summer squash, potato, and tomato. Limited amounts of corn, carrot, and sweet potato. Green parts of scallions. Grains Gluten-free grains, such as rice, oats, buckwheat, quinoa, corn, polenta, and millet. Gluten-free pasta, bread, or cereal. Rice noodles. Corn tortillas. Meats and other proteins Unseasoned beef, pork, poultry, or fish. Eggs. Helene Loader. Tofu (firm) and tempeh. Limited amounts of nuts and seeds, such as almonds, walnuts, [place] nuts, pecans, peanuts, nut butters, pumpkin seeds, chia seeds, and sunflower seeds. Dairy Lactose-free milk, yogurt, and kefir. Lactose-free cottage cheese and ice cream. Non-dairy milks, such as almond, coconut, hemp, and rice milk. Non-dairy yogurt. Limited amounts of goat cheese, brie, mozzarella, parmesan, swiss, and other hard cheeses. Fats and oils Butter-free spreads. Vegetable oils, such as olive, canola, and sunflower oil. Seasoning and other foods  Artificial sweeteners with names that do not end in "ol," such as aspartame, saccharine, and stevia. Maple syrup,  white table sugar, raw sugar, brown sugar, and molasses. Mayonnaise, soy sauce, and tamari. Fresh basil, coriander, parsley, rosemary, and thyme. Beverages Water and mineral water. Sugar-sweetened soft drinks. Small amounts of orange juice or cranberry juice. Black and green tea. Most dry wines. Coffee. The items listed above may not be a complete list of foods and beverages you can eat. Contact a dietitian for more information. Foods to avoid Fruits Fresh, dried, and juiced forms of apple, pear, watermelon, peach, plum, cherries, apricots, blackberries, boysenberries, figs, nectarines, and mango. Avocado. Vegetables Chicory root, artichoke, asparagus, cabbage, snow peas, Brussels sprouts, broccoli, sugar snap peas, mushrooms, celery, and cauliflower. Onions, garlic, leeks, and the white part of scallions. Grains Wheat, including kamut, durum, and semolina. Barley and bulgur. Couscous. Wheat-based cereals. Wheat noodles, bread, crackers, and pastries. Meats and other proteins Fried or fatty meat. Sausage. Cashews and pistachios. Soybeans, baked beans, black beans, chickpeas, kidney beans, fava beans, navy beans, lentils, black-eyed peas, and split peas. Dairy Milk, yogurt, ice cream, and soft cheese. Cream and sour cream. Milk-based sauces. Custard. Buttermilk. Soy milk. Seasoning and other foods Any sugar-free gum or candy. Foods that contain artificial sweeteners such as sorbitol, mannitol, isomalt, or xylitol. Foods that contain honey, high-fructose corn syrup, or agave. Bouillon, vegetable stock, beef stock, and chicken stock. Garlic and onion powder. Condiments made with onion, such as hummus, chutney, pickles, relish, salad dressing, and salsa. Tomato paste. Beverages Chicory-based drinks. Coffee substitutes. Chamomile tea. Fennel tea. Sweet or fortified wines such as port or sherry. Diet soft drinks made with isomalt, mannitol, maltitol, sorbitol, or xylitol. Apple, pear, and mango juice.  Juices with high-fructose corn syrup. The items listed above may not be a complete list of foods and beverages you should avoid. Contact a dietitian for more information. Summary FODMAP stands for fermentable oligosaccharides, disaccharides, monosaccharides, and polyols. These are sugars that are hard for some people to digest. A low-FODMAP eating plan is a short-term diet that helps to ease symptoms of certain bowel diseases. The eating plan usually lasts up to 6 weeks. After that, high-FODMAP foods are reintroduced gradually and one at a time. This can help you find out which foods may be causing symptoms. A low-FODMAP eating plan can be complicated. It is best to work with a dietitian who has experience with this type of plan. This information is not intended to replace advice given to you by your health care provider. Make sure you discuss any questions you have with your health care provider. Document Revised: 08/28/2019 Document Reviewed: 08/28/2019 Elsevier Patient Education  2024 [employer].

## 2023-08-07 NOTE — Addendum Note (Signed)
 Addended byPatricio Boop on: 08/07/2023 09:13 AM   Modules accepted: Orders

## 2023-08-09 LAB — IGA: Immunoglobulin A: 151 mg/dL (ref 47–310)

## 2023-08-09 LAB — TIQ-MISC

## 2023-08-09 LAB — TEST AUTHORIZATION

## 2023-08-09 LAB — TISSUE TRANSGLUTAMINASE, IGA: (tTG) Ab, IgA: <1 U/mL

## 2023-08-11 ENCOUNTER — Encounter: Payer: Self-pay | Admitting: Gastroenterology

## 2023-08-13 ENCOUNTER — Ambulatory Visit: Admitting: Medical

## 2023-08-13 VITALS — BP 106/68 | HR 84 | Temp 99.7°F | Wt 201.8 lb

## 2023-08-13 DIAGNOSIS — R52 Pain, unspecified: Secondary | ICD-10-CM | POA: Diagnosis not present

## 2023-08-13 DIAGNOSIS — R8290 Unspecified abnormal findings in urine: Secondary | ICD-10-CM

## 2023-08-13 DIAGNOSIS — J029 Acute pharyngitis, unspecified: Secondary | ICD-10-CM

## 2023-08-13 DIAGNOSIS — J301 Allergic rhinitis due to pollen: Secondary | ICD-10-CM

## 2023-08-13 LAB — POCT URINALYSIS DIP (PROADVANTAGE DEVICE)
Bilirubin, UA: NEGATIVE
Blood, UA: NEGATIVE
Glucose, UA: NEGATIVE mg/dL
Ketones, POC UA: NEGATIVE mg/dL
Leukocytes, UA: NEGATIVE
Nitrite, UA: NEGATIVE
Protein Ur, POC: NEGATIVE mg/dL
Specific Gravity, Urine: 1.015
Urobilinogen, Ur: NEGATIVE
pH, UA: 6 (ref 5.0–8.0)

## 2023-08-13 LAB — POC COVID19 BINAXNOW: SARS Coronavirus 2 Ag: NEGATIVE

## 2023-08-13 LAB — POCT RAPID STREP A (OFFICE): Rapid Strep A Screen: NEGATIVE

## 2023-08-13 LAB — POCT INFLUENZA A/B
Influenza A, POC: NEGATIVE
Influenza B, POC: NEGATIVE

## 2023-08-13 NOTE — Progress Notes (Signed)
 Subjective:  [name] is a 43 y.o. female who presents for Chief Complaint  Patient presents with   Sore Throat    Sore throat and hurts to swallow. Symptoms include HA, and some wheezing, body aches. Symptoms started Saturday evening     Here for sore throat x 2 days.  Hurts to swallow.  Has some headache.  Mostly frontal headache.  No ear pain.  Some sneezing, no runny nose.  Not stopped up.   No cough. Eyes burn, seems red and itchy. Has felt some body aches.  Felt possible febrile.  Using ibuprofen  and zyrtec.  No sick contacts.  She does tend to get spring allergies  She also notes a little bit of discomfort with urination.  No burning with some irritation.  Urine is cloudier than normal.  No concern for STD.  No new partner.  She notes having a Pap smear and STD screen that was negative a few months ago.  No current vaginal discharge.  No other aggravating or relieving factors.    No other c/o.  Past Medical History:  Diagnosis Date   Acute vaginitis 03/31/2022   Anemia    h/o   Anginal pain (HCC)    pt. takes nitrostat - as needed, hasn't had since 04/2012, sees Dr. 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 2013   Neuromuscular disorder (HCC)    muscle spasms-back & arms    SVT (supraventricular tachycardia) (HCC) 01/22/2012   UTI (lower urinary tract infection) 01/20/2012   Current Outpatient Medications on File Prior to Visit  Medication Sig Dispense Refill   albuterol  (VENTOLIN  HFA) 108 (90 Base) MCG/ACT inhaler INHALE 1-2 PUFFS BY MOUTH EVERY 6 HOURS AS NEEDED FOR WHEEZE OR SHORTNESS OF BREATH 18 each 3   dicyclomine  (BENTYL ) 20 MG tablet Take 1 tablet  (20 mg total) by mouth 3 (three) times daily as needed for spasms (Belly cramping.). 90 tablet 3   ibuprofen  (ADVIL ) 800 MG tablet TAKE 1 TABLET BY MOUTH 2 TIMES DAILY AS NEEDED. 60 tablet 1   pantoprazole  (PROTONIX ) 40 MG tablet Take 1 tablet (40 mg total) by mouth daily. 90 tablet 1   No current facility-administered medications on file prior to visit.     The following portions of the patient's history were reviewed and updated as appropriate: allergies, current medications, past family history, past medical history, past social history, past surgical history and problem list.  ROS Otherwise as in subjective above   Objective: BP 106/68   Pulse 84   Temp 99.7 F (37.6 C)   Wt 201 lb 12.8 oz (91.5 kg)   BMI 34.64 kg/m   General appearance: alert, no distress, well developed, well nourished HEENT: normocephalic, sclerae anicteric, conjunctiva pink and moist, TMs pearly, nares patent, no discharge, mild erythema, pharynx with only minimal erythema  oral cavity: MMM, no lesions Neck: supple, no lymphadenopathy, no thyromegaly, no masses Heart: RRR, normal S1, S2, no murmurs Lungs: CTA bilaterally, no wheezes, rhonchi, or rales Abdomen: +bs, soft, non tender, non distended, no masses, no hepatomegaly, no splenomegaly    Assessment: Encounter Diagnoses  Name Primary?  Sore throat Yes   Body aches    Cloudy urine    Allergic rhinitis due to pollen, unspecified seasonality      Plan: Sore throat, body aches-symptoms suggest possible viral URI versus seasonal allergies.  She has some overlap of symptoms Drink at least 80 to 100 ounces of water daily Consider salt water gargles 3-4 times a day to soothe the throat and to clear out mucus from the throat Consider over-the-counter Chloraseptic sore throat spray as needed Use ibuprofen  400 mg twice daily for the next 2 days for pain discomfort Continue allergy medicine Zyrtec at night If much worse in the next few days call  back  Allergic rhinitis-continue Zyrtec at night for the next 1 to 2 months.  Can alternate with Allegra every other month.  Can continue eyedrops for allergies over-the-counter.  Cloudy urine-urinalysis reviewed, unremarkable Drink plan of the water over the next few days Consider adding some lemon juice to water the next few days to acidify the urine If worsens in the coming days and let us  know No obvious signs of urinary tract infection today   Teairra was seen today for sore throat.  Diagnoses and all orders for this visit:  Sore throat -     POC COVID-19 -     POCT Influenza A/B -     Rapid Strep A  Body aches -     POC COVID-19 -     POCT Influenza A/B -     Rapid Strep A  Cloudy urine -     POCT Urinalysis DIP (Proadvantage Device)  Allergic rhinitis due to pollen, unspecified seasonality    Follow up: prn

## 2023-08-13 NOTE — Patient Instructions (Signed)
 Sore throat, body aches-symptoms suggest possible viral URI versus seasonal allergies.  She has some overlap of symptoms Drink at least 80 to 100 ounces of water daily Consider salt water gargles 3-4 times a day to soothe the throat and to clear out mucus from the throat Consider over-the-counter Chloraseptic sore throat spray as needed Use ibuprofen  400 mg twice daily for the next 2 days for pain discomfort Continue allergy medicine Zyrtec at night If much worse in the next few days call back  Allergic rhinitis-continue Zyrtec at night for the next 1 to 2 months.  Can alternate with Allegra every other month.  Can continue eyedrops for allergies over-the-counter.  Cloudy urine-urinalysis reviewed, unremarkable Drink plan of the water over the next few days Consider adding some lemon juice to water the next few days to acidify the urine If worsens in the coming days and let us  know No obvious signs of urinary tract infection today

## 2023-08-15 ENCOUNTER — Other Ambulatory Visit: Payer: Self-pay | Admitting: Medical

## 2023-08-15 MED ORDER — FLUTICASONE PROPIONATE 50 MCG/ACT NA SUSP: 1.0000 | Freq: Every day | NASAL | 2 refills | Status: DC

## 2023-08-15 MED ORDER — AZITHROMYCIN 250 MG PO TABS: ORAL_TABLET | ORAL | 0 refills | Status: DC

## 2023-08-15 MED ORDER — FLUCONAZOLE 150 MG PO TABS
150.0000 mg | ORAL_TABLET | ORAL | 0 refills | Status: DC
Start: 2023-08-15 — End: 2023-11-14

## 2023-08-15 MED ORDER — BENZONATATE 200 MG PO CAPS
200.0000 mg | ORAL_CAPSULE | Freq: Three times a day (TID) | ORAL | 0 refills | Status: DC | PRN
Start: 2023-08-15 — End: 2023-11-14

## 2023-08-18 ENCOUNTER — Other Ambulatory Visit (HOSPITAL_BASED_OUTPATIENT_CLINIC_OR_DEPARTMENT_OTHER): Payer: Self-pay | Admitting: Nurse Practitioner

## 2023-08-18 DIAGNOSIS — R519 Headache, unspecified: Secondary | ICD-10-CM

## 2023-08-20 NOTE — Telephone Encounter (Signed)
 Last appt. 02/01/23 CPE

## 2023-09-18 ENCOUNTER — Encounter: Payer: Self-pay | Admitting: Nurse Practitioner

## 2023-09-27 ENCOUNTER — Encounter: Payer: Self-pay | Admitting: Podiatry

## 2023-09-27 ENCOUNTER — Ambulatory Visit (INDEPENDENT_AMBULATORY_CARE_PROVIDER_SITE_OTHER): Admitting: Podiatry

## 2023-09-27 DIAGNOSIS — M205X2 Other deformities of toe(s) (acquired), left foot: Secondary | ICD-10-CM

## 2023-09-27 DIAGNOSIS — M205X1 Other deformities of toe(s) (acquired), right foot: Secondary | ICD-10-CM | POA: Diagnosis not present

## 2023-09-27 DIAGNOSIS — L84 Corns and callosities: Secondary | ICD-10-CM | POA: Diagnosis not present

## 2023-09-27 MED ORDER — AMMONIUM LACTATE 12 % EX CREA: 1.0000 | TOPICAL_CREAM | CUTANEOUS | 2 refills | Status: AC | PRN

## 2023-09-27 NOTE — Patient Instructions (Signed)
  Choose a moisturizer from  the list below:  For normal skin: Moisturize feet once daily; do not apply between toes A.  CeraVe Daily Moisturizing Lotion B.  Lubriderm Advanced Therapy Lotion or Lubriderm Intense Skin Repair Lotion C.  Aquaphor Intensive Repair Lotion D.  Gold Bond Ultimate Diabetic Foot Lotion E.  Eucerin Intensive Repair Moisturizing Lotion  For extremely dry, cracked feet: moisturize feet once daily; do not apply between toes A. CeraVe Healing Ointment B. Eucerin Aquaphor Advanced Repair Cream C. Vaseline Petroleum Healing Jelly   If you have problems reaching your feet: apply to feet once daily; do not apply between toes A. Aquaphor Advanced Therapy Ointment Body Spray  B.  Vaseline Intensive Care Spray Moisturizer (Unscented,  Cocoa Radiant Spray or Aloe Smooth Spray)

## 2023-09-27 NOTE — Progress Notes (Signed)
  Subjective:  Patient ID: [name], female    DOB: 1981-01-05,  MRN: [id number]  Chief Complaint  Patient presents with   Callouses    RM#[id number] Bilateral calluses causing great deal of pain has tried trimming at home with no success.    Discussed the use of AI scribe software for clinical note transcription with the patient, who gave verbal consent to proceed.  History of Present Illness [name] is a 43 year old female who presents with painful calluses on her feet.  The calluses are primarily located on her heels and fifth toes, causing significant discomfort. She trims them herself, but they tend to grow back thicker. She has tried soaking them and using corn removers but is cautious about overuse due to the risk of infection. The calluses do not typically open, bleed, or drain unless cut too deeply, which she avoids.  She is not on blood thinners and does not have diabetes.Her job with the [employer] involves a lot of walking and activities, which may contribute to the callus formation. She has tried various moisturizers, but some, like Vaseline, seem to make the calluses thicker. She seeks effective over-the-counter treatments to manage the calluses.      Objective:    Physical Exam General: AAO x3, NAD  Dermatological: Thick hyperkeratotic tissue noted bilateral heels as well as the fifth digits laterally on the left side worse than the right.  There is no underlying ulceration, drainage or signs of infection.  Small dried blood present callus to the left heel.  No ulcerations are identified.  No signs of infection at this time.  Vascular: Dorsalis Pedis artery and Posterior Tibial artery pedal pulses are 2/4 bilateral with immedate capillary fill time.  There is no pain with calf compression, swelling, warmth, erythema.   Neruologic: Grossly intact via light touch bilateral.   Musculoskeletal: Tenderness in the skin lesions.  Adductovarus is present fifth  toes.  Gait: Unassisted, Nonantalgic.     No images are attached to the encounter.    Results    Assessment:   1. 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 Acquired adductovarus rotation of toe, left   3. Callus      Plan:  Patient was evaluated and treated and all questions answered.  Assessment and Plan Assessment & Plan Calluses on feet Chronic calluses on heels and fifth toes causing discomfort.  - As a courtesy I debrided the hyperkeratotic lesions without any complications or bleeding today. - Provide pads for fifth toes to reduce pressure. - Recommend shoes with wide toe box. - Provide list of recommended moisturizers at front desk. - Prescribe Amlactin and send to pharmacy. - Advise gel heel pads in shoes. - Suggest pumice stone for light filing after showering. - Instruct consistent use of creams and pads daily.   No follow-ups on file.   Charity Conch DPM

## 2023-10-19 ENCOUNTER — Other Ambulatory Visit: Payer: Self-pay | Admitting: Medical

## 2023-11-01 ENCOUNTER — Ambulatory Visit: Admitting: Gastroenterology

## 2023-11-14 ENCOUNTER — Ambulatory Visit (INDEPENDENT_AMBULATORY_CARE_PROVIDER_SITE_OTHER): Admitting: Family Medicine

## 2023-11-14 ENCOUNTER — Encounter: Payer: Self-pay | Admitting: Family Medicine

## 2023-11-14 ENCOUNTER — Ambulatory Visit: Payer: Self-pay

## 2023-11-14 VITALS — BP 100/70 | HR 100 | Temp 98.1°F | Ht 64.0 in | Wt 193.6 lb

## 2023-11-14 DIAGNOSIS — F411 Generalized anxiety disorder: Secondary | ICD-10-CM

## 2023-11-14 DIAGNOSIS — F3181 Bipolar II disorder: Secondary | ICD-10-CM

## 2023-11-14 DIAGNOSIS — T7849XA Other allergy, initial encounter: Secondary | ICD-10-CM | POA: Diagnosis not present

## 2023-11-14 NOTE — Telephone Encounter (Signed)
 Copied from CRM [id number]. Topic: Clinical - Red Word Triage >> Nov 14, 2023  8:19 AM Marissa P wrote: Red Word that prompted transfer to Nurse Triage: Patient has been having some hives (like a rash on her face), panic attack and really bad anxiety lately. Would like to come in as soon as possible.

## 2023-11-14 NOTE — Progress Notes (Signed)
 Chief Complaint  Patient presents with   Urticaria    Patient have been having hives and itching on her face that began Monday. Has been having panic attacks about twice a day for a couple weeks. Sometimes being overwhelmed can bring these on for her.    She had tried some new makeup on Sunday.  On Monday, she noted some welps at first, followed by some little bumps on her nose, chin, above her upper lip, and eyelids--everywhere she applied makeup Welps--she described puffy on face, chest, looked swollen.  She took a benadryl  and these resolved. Last benadryl  was Monday night. When she feels very anxious, she might see some swelling/welps return.  Today she noticed the bumps on her upper lip. She has some itching on her eyelids and chin. The lip doesn't itch.  Anxiety has been bad.  She stopped celexa  in April, trying to handle things on her own. Has a job with the [employer], which she enjoys. She went for an assessment at [facility] a couple of months ago.  She is trying to get a therapist, but reports they don't have any. She saw someone at [facility], telehealth--was prescribed a new medication, but wasn't covered by her insurance--thinks [facility].    PMH, PSH, SH reviewed Bipolar, anxiety  Outpatient Encounter Medications as of 11/14/2023  Medication Sig Note   ammonium lactate  (AMLACTIN) 12 % cream Apply 1 Application topically as needed for dry skin.    dicyclomine  (BENTYL ) 20 MG tablet Take 1 tablet (20 mg total) by mouth 3 (three) times daily as needed for spasms (Belly cramping.). 11/14/2023: Prn, took 8pm last night   pantoprazole  (PROTONIX ) 40 MG tablet Take 1 tablet (40 mg total) by mouth daily.    albuterol  (VENTOLIN  HFA) 108 (90 Base) MCG/ACT inhaler INHALE 1-2 PUFFS BY MOUTH EVERY 6 HOURS AS NEEDED FOR WHEEZE OR SHORTNESS OF BREATH (Patient not taking: Reported on 11/14/2023) 11/14/2023: As needed prior to exercise, not recently   fluticasone  (FLONASE ) 50 MCG/ACT nasal spray  Place 1 spray into both nostrils daily. (Patient not taking: Reported on 11/14/2023) 11/14/2023: As needed, not recently   ibuprofen  (ADVIL ) 800 MG tablet TAKE 1 TABLET BY MOUTH 2 TIMES DAILY AS NEEDED. (Patient not taking: Reported on 11/14/2023) 11/14/2023: As needed, not in the last 24hrs   [DISCONTINUED] azithromycin  (ZITHROMAX ) 250 MG tablet 2 tablets day 1, then 1 tablet days 2-4 (Patient not taking: Reported on 09/27/2023)    [DISCONTINUED] benzonatate  (TESSALON ) 200 MG capsule Take 1 capsule (200 mg total) by mouth 3 (three) times daily as needed for cough. (Patient not taking: Reported on 09/27/2023)    [DISCONTINUED] fluconazole  (DIFLUCAN ) 150 MG tablet Take 1 tablet (150 mg total) by mouth once a week. (Patient not taking: Reported on 09/27/2023)    No facility-administered encounter medications on file as of 11/14/2023.   Allergies  Allergen Reactions   Amoxicillin Anaphylaxis and Hives    Has patient had a PCN reaction causing immediate rash, facial/tongue/throat swelling, SOB or lightheadedness with hypotension: Yes Has patient had a PCN reaction causing severe rash involving mucus membranes or skin necrosis: Yes Has patient had a PCN reaction that required hospitalization No Has patient had a PCN reaction occurring within the last 10 years: No If all of the above answers are NO, then may proceed with Cephalosporin use.    Imitrex [Sumatriptan Base] Anaphylaxis and Hives   Penicillins Anaphylaxis and Hives    Has patient had a PCN reaction causing immediate rash, facial/tongue/throat  swelling, SOB or lightheadedness with hypotension: Yes Has patient had a PCN reaction causing severe rash involving mucus membranes or skin necrosis: Yes Has patient had a PCN reaction that required hospitalization No Has patient had a PCN reaction occurring within the last 10 years: No If all of the above answers are NO, then may proceed with Cephalosporin use.    Tramadol Shortness Of Breath and  Palpitations   Abilify [Aripiprazole]     seizures   Norco [Hydrocodone -Acetaminophen ] Other (See Comments)    Hallucinations    Percocet [Oxycodone -Acetaminophen ] Other (See Comments)    Hallucinations    Soy Allergy (Obsolete) Hives    ROS: no f/c, URI symptoms, shortness of breath, CP.  +anxiety. Rash on face per HPI. No other symptoms or complaints.    PHYSICAL EXAM:  BP 100/70   Pulse 100   Temp 98.1 F (36.7 C) (Tympanic)   Ht 5' 4 (1.626 m)   Wt 193 lb 9.6 oz (87.8 kg)   BMI 33.23 kg/m   Well-appearing, anxious female, in no acute distress HEENT conjunctiva and sclera are clear, EOMI. TM's and EAC's normal.  OP clear. Neck: no lymphadenopathy, thyromegaly or mass Heart: regular rate and rhythm Lungs: clear bilaterally Skin: Small pustule above upper lip on L. One small papule on forehead above L eye. No urticaria, erythema, other lesions/rash. Chest appears normal. Psych: anxious, full range of affect. Normal hygiene and grooming Neuro: alert and oriented, cranial nerves grossly intact. Normal gait     11/14/2023   11:31 AM 05/03/2023   10:04 AM 09/19/2022    1:31 PM 08/14/2022   11:48 AM 11/03/2021    8:59 AM  Depression screen PHQ 2/9  Decreased Interest 2 0 0 1 1  Down, Depressed, Hopeless 2 0 2 2 1   PHQ - 2 Score 4 0 2 3 2   Altered sleeping 1  0 2 2  Tired, decreased energy 2  0 3 2  Change in appetite 2  0 1 2  Feeling bad or failure about yourself  2  3 2 1   Trouble concentrating 2  0 2 3  Moving slowly or fidgety/restless 3  0 3 3  Suicidal thoughts 0  0 1 0  PHQ-9 Score 16  5 17 15   Difficult doing work/chores Somewhat difficult  Somewhat difficult Very difficult Extremely dIfficult      11/14/2023   11:30 AM 10/10/2022    1:41 PM 11/03/2021    9:01 AM  GAD 7 : Generalized Anxiety Score  Nervous, Anxious, on Edge 2 3 1   Control/stop worrying 2 0 1  Worry too much - different things 3 1 1   Trouble relaxing 1 1 3   Restless 3 2 3   Easily  annoyed or irritable 3 1 3   Afraid - awful might happen 2 2 0  Total GAD 7 Score 16 10 12   Anxiety Difficulty Not difficult at all Somewhat difficult Extremely difficult      ASSESSMENT/PLAN:  Allergic reaction to cosmetics - improving.  Reassured re: exam today.  HC 1% BID prn itching/rash. Benadryl  prn hives (hasn't needed x2d)  Bipolar 2 disorder (HCC) - currently off treatment. Encouraged her to f/u with psych (telehealth, or through [facility]), to discuss other medications, that are covered by insurance  Generalized anxiety disorder - currently off medication. Had assessment with [facility]. Encouraged to find therapist ,psychiatrist. Counseled re: breathe the box breathing   Your skin looks pretty clear today.   I suspect you may  have had a reaction to the makeup you had used on Sunday, but appears to be improving. You can use over-the-counter hydrocortisone  cream (1%) twice daily if needed for itching. Continue to avoid makeup and new products. If hives/welps recur, use the antihistamine (benadryl ).  Please follow-up with [facility] in trying to get a therapist, and perhaps psychiatrist (vs continuing with the telehealth). If you can't find a therapist there, look anywhere else that takes your insurance [facility], [facility], [facility] etc).  Use the breathe the box technique that we discussed--breathing in for 4 seconds, holding your breath for 4 seconds, breathing out for 4 seconds, holding the empty breath for 4 seconds, and continue to repeat this cycle.   I spent 30 minutes dedicated to the care of this patient, including pre-visit review of records, face to face time, post-visit ordering of testing and documentation.

## 2023-11-14 NOTE — Telephone Encounter (Signed)
 This RN attempted outbound attempt to patient - 1st attempt. No answer. LVM.

## 2023-11-14 NOTE — Patient Instructions (Signed)
 Your skin looks pretty clear today.   I suspect you may have had a reaction to the makeup you had used on Sunday, but appears to be improving. You can use over-the-counter hydrocortisone  cream (1%) twice daily if needed for itching. Continue to avoid makeup and new products. If hives/welps recur, use the antihistamine (benadryl ).  Please follow-up with [facility] in trying to get a therapist, and perhaps psychiatrist (vs continuing with the telehealth). If you can't find a therapist there, look anywhere else that takes your insurance [facility], [facility], [facility] etc).  Use the breathe the box technique that we discussed--breathing in for 4 seconds, holding your breath for 4 seconds, breathing out for 4 seconds, holding the empty breath for 4 seconds, and continue to repeat this cycle.

## 2023-11-14 NOTE — Telephone Encounter (Signed)
 FYI Only or Action Required?: FYI only for provider.  Patient was last seen in primary care on 08/13/2023 by Bulah Alm RAMAN, PA-C.  Called Nurse Triage reporting Rash.  Symptoms began several days ago.  Interventions attempted: Nothing.  Symptoms are: gradually worsening.  Triage Disposition: See PCP When Office is Open (Within 3 Days)  Patient/caregiver understands and will follow disposition?: Yes        Reason for Disposition  [1] Pimples (localized) AND Domitilla.Distel ] no improvement after using Care Advice  Answer Assessment - Initial Assessment Questions This RN returned patient's call and she had already gone inside office at [facility] to schedule an appt for today.     1. APPEARANCE of RASH: What does the rash look like? (e.g., blisters, dry flaky skin, red spots, redness, sores)     Patient states the bumps are so tiny around nose crevices 2. LOCATION: Where is the rash located?      In crevices around nose and mouth, eyes, and chin 3. NUMBER: How many spots are there?      Unable to count 4. SIZE: How big are the spots? (e.g., inches, cm; or compare to size of pinhead, tip of pen, eraser, pea)      Patient states the spots are so tiny and you have to get up close 5. ONSET: When did the rash start?      Monday 6. ITCHING: Does the rash itch? If Yes, ask: How bad is the itch?  (Scale 0-10; or none, mild, moderate, severe)     Patient states she is unable to determine, it is off and on 7. PAIN: Does the rash hurt? If Yes, ask: How bad is the pain?  (Scale 0-10; or none, mild, moderate, severe)     No 8. OTHER SYMPTOMS: Do you have any other symptoms? (e.g., fever)     Patient denies fever or drainage  Protocols used: Rash or Redness - Localized-A-AH

## 2023-11-22 ENCOUNTER — Ambulatory Visit: Admitting: Nurse Practitioner

## 2023-11-25 ENCOUNTER — Encounter: Payer: Self-pay | Admitting: Nurse Practitioner

## 2023-11-27 ENCOUNTER — Telehealth: Payer: Self-pay

## 2023-11-27 DIAGNOSIS — G44219 Episodic tension-type headache, not intractable: Secondary | ICD-10-CM

## 2023-11-27 MED ORDER — CYCLOBENZAPRINE HCL 10 MG PO TABS: 10.0000 mg | ORAL_TABLET | Freq: Every day | ORAL | 0 refills | Status: DC | PRN

## 2023-11-27 NOTE — Telephone Encounter (Signed)
 Called pt. Back and she is requesting a refill on Cyclobenzaprine  10 mg she doesn't use it much but needs it from time to time for her musculoskeletal pain and pain she is behind her eye from time  to time. I told her I would ask you about it but she may need an appt.   Copied from CRM [id number]. Topic: Clinical - Medication Question >> Nov 27, 2023  7:55 AM Laurier BROCKS wrote: Reason for CRM: Patient would like a call back at [phone]. Patient is requesting a med refill for a muscle relaxer but is unsure of the name of the medication. Patient was advised she would need to schedule an appointment due to some of her symptoms she experienced on 11/25/23, patient is wanting a call back from the nurse instead.

## 2023-11-27 NOTE — Telephone Encounter (Signed)
 Please let her know I have sent this refill in for her.

## 2023-11-27 NOTE — Telephone Encounter (Signed)
 Appointment scheduled.

## 2023-12-17 ENCOUNTER — Ambulatory Visit (INDEPENDENT_AMBULATORY_CARE_PROVIDER_SITE_OTHER): Admitting: Nurse Practitioner

## 2023-12-17 ENCOUNTER — Encounter: Payer: Self-pay | Admitting: Nurse Practitioner

## 2023-12-17 VITALS — BP 130/82 | HR 88 | Wt 193.6 lb

## 2023-12-17 DIAGNOSIS — R002 Palpitations: Secondary | ICD-10-CM | POA: Diagnosis not present

## 2023-12-17 DIAGNOSIS — R000 Tachycardia, unspecified: Secondary | ICD-10-CM

## 2023-12-17 DIAGNOSIS — R6889 Other general symptoms and signs: Secondary | ICD-10-CM

## 2023-12-17 DIAGNOSIS — R7303 Prediabetes: Secondary | ICD-10-CM | POA: Diagnosis not present

## 2023-12-17 DIAGNOSIS — R202 Paresthesia of skin: Secondary | ICD-10-CM | POA: Diagnosis not present

## 2023-12-17 DIAGNOSIS — F410 Panic disorder [episodic paroxysmal anxiety] without agoraphobia: Secondary | ICD-10-CM | POA: Insufficient documentation

## 2023-12-17 DIAGNOSIS — E559 Vitamin D deficiency, unspecified: Secondary | ICD-10-CM

## 2023-12-17 HISTORY — DX: Tachycardia, unspecified: R00.0

## 2023-12-17 HISTORY — DX: Paresthesia of skin: R20.2

## 2023-12-17 HISTORY — DX: Palpitations: R00.2

## 2023-12-17 HISTORY — DX: Other general symptoms and signs: R68.89

## 2023-12-17 LAB — CBC WITH DIFFERENTIAL/PLATELET

## 2023-12-17 NOTE — Assessment & Plan Note (Signed)
 Symptoms of cold intolerance, palpitations, and weight gain suggest possible thyroid dysfunction, which could explain the constellation of symptoms including anxiety and somatic complaints. - Order thyroid function tests

## 2023-12-17 NOTE — Progress Notes (Signed)
 Camie FORBES Doing, DNP, AGNP-c [facility] [address] [place], [place] [address] [phone]   ACUTE VISIT on 12/17/2023  Blood pressure 130/82, pulse 88, weight 193 lb 9.6 oz (87.8 kg).  Subjective:  HPI History of Present Illness [name] is a 43 year old female who presents with heart racing, feeling cold, anxiety, and tingling and numbness in the arms and legs.  She experiences episodes of heart racing and feeling cold, noticeable even during warm weather. During one incident, she was visibly shaking and felt extremely cold, which was unusual for the summer. These episodes are often accompanied by anxiety, especially when researching her symptoms online or reviewing lab results, and she experiences physical symptoms such as shaking.  She describes tingling and numbness in her arms, hands, feet, and ankles, likened to 'pins and needles.' These symptoms began the previous day and have been intermittent. She notes that these sensations sometimes coincide with dietary choices, particularly after consuming starches or sugary foods. She has a history of dietary modifications, avoiding starches and sugars due to their impact on her symptoms, including dizziness and joint pain. She recalls a past prescription for gabapentin  for neuropathic pain but prefers to avoid such medications now.  She mentions a significant weight gain of about 25 pounds over a three-month period last year, which she attributes to dietary habits and possibly menopause, she has had a hysterectomy. She is actively trying to lose weight through exercise and dietary changes, including reducing junk food and portion sizes. She has been taking vitamin D  once a week for about a month. She also mentions regular bowel and bladder habits, with no significant changes noted. She drinks a lot of water and occasionally experiences clear urine, but no burning or discomfort during urination.  In terms of social  history, she works with a client with autism and engages in physical activities like going to [employer], which she finds beneficial for her health.   Depression screen 8 Anxiety screen 13- improved from last time.   ROS negative except for what is listed in HPI. History, Medications, Surgery, SDOH, and Family History reviewed and updated as appropriate.  Objective:  Physical Exam Vitals and nursing note reviewed.  Constitutional:      Appearance: Normal appearance.  HENT:     Head: Normocephalic.  Eyes:     Conjunctiva/sclera: Conjunctivae normal.     Pupils: Pupils are equal, round, and reactive to light.  Neck:     Vascular: No carotid bruit.  Cardiovascular:     Rate and Rhythm: Regular rhythm. Tachycardia present.     Pulses: Normal pulses.     Heart sounds: Normal heart sounds. No murmur heard. Pulmonary:     Effort: Pulmonary effort is normal.     Breath sounds: Normal breath sounds.  Abdominal:     General: Bowel sounds are normal. There is no distension.     Palpations: Abdomen is soft. There is no mass.     Tenderness: There is no abdominal tenderness. There is no right CVA tenderness, left CVA tenderness or guarding.  Musculoskeletal:        General: No tenderness. Normal range of motion.     Cervical back: Neck supple. No rigidity or tenderness.     Right lower leg: No edema.     Left lower leg: No edema.  Lymphadenopathy:     Cervical: No cervical adenopathy.  Skin:    General: Skin is warm and dry.  Capillary Refill: Capillary refill takes less than 2 seconds.  Neurological:     Mental Status: She is alert and oriented to person, place, and time.     Sensory: No sensory deficit.     Motor: No weakness.     Coordination: Coordination normal.  Psychiatric:        Attention and Perception: Attention normal.        Mood and Affect: Mood and affect normal.        Speech: Speech normal.        Behavior: Behavior normal. Behavior is cooperative.         Thought Content: Thought content normal.         Assessment & Plan:   Problem List Items Addressed This Visit     Pre-diabetes   Recheck labs today. Unclear if blood sugar abnormalities are contributing to symptoms.       Relevant Orders   Hemoglobin A1c   CBC with Differential/Platelet   Comprehensive metabolic panel with GFR   Vitamin B12   Iron, TIBC and Ferritin Panel   T4, free   TSH   VITAMIN D  25 Hydroxy (Vit-D Deficiency, Fractures)   Lipid panel   Palpitations - Primary   Symptoms of cold intolerance, palpitations, and weight gain suggest possible thyroid dysfunction, which could explain the constellation of symptoms including anxiety and somatic complaints. - Order thyroid function tests      Relevant Orders   Hemoglobin A1c   CBC with Differential/Platelet   Comprehensive metabolic panel with GFR   Vitamin B12   Iron, TIBC and Ferritin Panel   T4, free   TSH   VITAMIN D  25 Hydroxy (Vit-D Deficiency, Fractures)   Lipid panel   Panic disorder without agoraphobia   Intermittent anxiety with somatic symptoms including palpitations, paresthesia, and cold intolerance, exacerbated by internet searches and lab result reviews. Differential includes panic attacks, thyroid abnormality, blood sugar abnormality, and nutritional deficiencies. - Check labs including thyroid function, B12, A1c, and vitamin D  levels - Advise limiting internet searches related to symptoms to reduce anxiety      Relevant Orders   Hemoglobin A1c   CBC with Differential/Platelet   Comprehensive metabolic panel with GFR   Vitamin B12   Iron, TIBC and Ferritin Panel   T4, free   TSH   VITAMIN D  25 Hydroxy (Vit-D Deficiency, Fractures)   Lipid panel   Paresthesias   Paresthesia and cold intolerance could be related to vitamin B12, thyroid, or D deficiency. Recent initiation of vitamin D  supplementation. Vitamin B12 deficiency could also contribute to oral symptoms such as angular  cheilitis. - Order vitamin B12, thyroid, and vitamin D  levels      Relevant Orders   Hemoglobin A1c   CBC with Differential/Platelet   Comprehensive metabolic panel with GFR   Vitamin B12   Iron, TIBC and Ferritin Panel   T4, free   TSH   VITAMIN D  25 Hydroxy (Vit-D Deficiency, Fractures)   Lipid panel   Sensation of feeling cold   Relevant Orders   Hemoglobin A1c   CBC with Differential/Platelet   Comprehensive metabolic panel with GFR   Vitamin B12   Iron, TIBC and Ferritin Panel   T4, free   TSH   VITAMIN D  25 Hydroxy (Vit-D Deficiency, Fractures)   Lipid panel   Tachycardia   Relevant Orders   Hemoglobin A1c   CBC with Differential/Platelet   Comprehensive metabolic panel with GFR   Vitamin B12  Iron, TIBC and Ferritin Panel   T4, free   TSH   VITAMIN D  25 Hydroxy (Vit-D Deficiency, Fractures)   Lipid panel   Vitamin D  deficiency   Relevant Orders   Hemoglobin A1c   CBC with Differential/Platelet   Comprehensive metabolic panel with GFR   Vitamin B12   Iron, TIBC and Ferritin Panel   T4, free   TSH   VITAMIN D  25 Hydroxy (Vit-D Deficiency, Fractures)   Lipid panel     Camie FORBES Doing, DNP, AGNP-c Time: 42 minutes, >50% spent counseling, care coordination, chart review, and documentation.

## 2023-12-17 NOTE — Assessment & Plan Note (Signed)
 Recheck labs today. Unclear if blood sugar abnormalities are contributing to symptoms.

## 2023-12-17 NOTE — Assessment & Plan Note (Signed)
 Intermittent anxiety with somatic symptoms including palpitations, paresthesia, and cold intolerance, exacerbated by internet searches and lab result reviews. Differential includes panic attacks, thyroid abnormality, blood sugar abnormality, and nutritional deficiencies. - Check labs including thyroid function, B12, A1c, and vitamin D  levels - Advise limiting internet searches related to symptoms to reduce anxiety

## 2023-12-17 NOTE — Assessment & Plan Note (Signed)
 Paresthesia and cold intolerance could be related to vitamin B12, thyroid, or D deficiency. Recent initiation of vitamin D  supplementation. Vitamin B12 deficiency could also contribute to oral symptoms such as angular cheilitis. - Order vitamin B12, thyroid, and vitamin D  levels

## 2023-12-17 NOTE — Patient Instructions (Addendum)
 We discussed symptoms of: - anxiety - numbness/tingling - nausea - fast heart rate - feeling cold  You report all of the symptoms worse after having processed foods or starches.  Some things we will test for today: - Thyroid condition - Blood sugar- these symptoms can happen when your blood sugars drop low, especially after eating low protein and high carbohydrate foods.  - Vitamin B12- this can cause many of your symptoms, including the cuts in the corner of the mouth.    I have given you a handout of a sample diet for 2 weeks that focuses on low fat, low cholesterol, low carbohydrates, high protein, and high fiber for a vegetarian style diet.  You do not have to follow this step by step, but use as a guide to help.   Keep working on your exercise. A goal of 150 minutes of moderate intensity exercise a week is great! You want to work out at a pace that makes your heart rate come up, but also allows you to have a conversation.

## 2023-12-18 LAB — COMPREHENSIVE METABOLIC PANEL WITH GFR
ALT: 7 IU/L (ref 0–32)
AST: 17 IU/L (ref 0–40)
Albumin: 4.2 g/dL (ref 3.9–4.9)
Alkaline Phosphatase: 93 IU/L (ref 44–121)
BUN/Creatinine Ratio: 10 (ref 9–23)
BUN: 6 mg/dL (ref 6–24)
Bilirubin Total: 0.2 mg/dL (ref 0.0–1.2)
CO2: 19 mmol/L — ABNORMAL LOW (ref 20–29)
Calcium: 9.5 mg/dL (ref 8.7–10.2)
Chloride: 102 mmol/L (ref 96–106)
Creatinine, Ser: 0.63 mg/dL (ref 0.57–1.00)
Globulin, Total: 3 g/dL (ref 1.5–4.5)
Glucose: 94 mg/dL (ref 70–99)
Potassium: 4.1 mmol/L (ref 3.5–5.2)
Sodium: 135 mmol/L (ref 134–144)
Total Protein: 7.2 g/dL (ref 6.0–8.5)
eGFR: 112 mL/min/1.73 (ref >59)

## 2023-12-18 LAB — CBC WITH DIFFERENTIAL/PLATELET
Basos: 1
EOS (ABSOLUTE): 0 x10E3/uL (ref 0.0–0.2)
Eos: 4
Hematocrit: 40 (ref 34.0–46.6)
Hemoglobin: 12.9 g/dL (ref 11.1–15.9)
Immature Granulocytes: 0
Immature Granulocytes: 0 x10E3/uL (ref 0.0–0.1)
Lymphs: 46
MCH: 28.9 pg (ref 26.6–33.0)
MCHC: 32.3 g/dL (ref 31.5–35.7)
MCV: 90 fL (ref 79–97)
Monocytes Absolute: 0.2 x10E3/uL (ref 0.0–0.4)
Monocytes Absolute: 0.4 x10E3/uL (ref 0.1–0.9)
Monocytes: 7
Neutrophils Absolute: 2 x10E3/uL (ref 1.4–7.0)
Neutrophils Absolute: 2.3 x10E3/uL (ref 0.7–3.1)
Neutrophils: 42
Platelets: 299 x10E3/uL (ref 150–450)
RBC: 4.46 x10E6/uL (ref 3.77–5.28)
RDW: 12.9 (ref 11.7–15.4)
WBC: 4.9 x10E3/uL (ref 3.4–10.8)

## 2023-12-18 LAB — VITAMIN D 25 HYDROXY (VIT D DEFICIENCY, FRACTURES): Vit D, 25-Hydroxy: 51.7 ng/mL (ref 30.0–100.0)

## 2023-12-18 LAB — IRON,TIBC AND FERRITIN PANEL
Ferritin: 36 ng/mL (ref 15–150)
Iron Saturation: 9 — AB (ref 15–55)
Iron: 32 ug/dL (ref 27–159)
Total Iron Binding Capacity: 354 ug/dL (ref 250–450)
UIBC: 322 ug/dL (ref 131–425)

## 2023-12-18 LAB — T4, FREE: Free T4: 1 ng/dL (ref 0.82–1.77)

## 2023-12-18 LAB — LIPID PANEL
Chol/HDL Ratio: 2.5 ratio (ref 0.0–4.4)
Cholesterol, Total: 141 mg/dL (ref 100–199)
HDL: 57 mg/dL (ref >39)
LDL Chol Calc (NIH): 71 mg/dL (ref 0–99)
Triglycerides: 65 mg/dL (ref 0–149)
VLDL Cholesterol Cal: 13 mg/dL (ref 5–40)

## 2023-12-18 LAB — VITAMIN B12: Vitamin B-12: 388 pg/mL (ref 232–1245)

## 2023-12-18 LAB — HEMOGLOBIN A1C
Est. average glucose Bld gHb Est-mCnc: 120 mg/dL
Hgb A1c MFr Bld: 5.8 % — ABNORMAL HIGH (ref 4.8–5.6)

## 2023-12-18 LAB — TSH: TSH: 1.3 u[IU]/mL (ref 0.450–4.500)

## 2023-12-19 ENCOUNTER — Other Ambulatory Visit: Payer: Self-pay

## 2023-12-19 ENCOUNTER — Ambulatory Visit: Payer: Self-pay | Admitting: Nurse Practitioner

## 2023-12-19 DIAGNOSIS — E538 Deficiency of other specified B group vitamins: Secondary | ICD-10-CM

## 2023-12-19 DIAGNOSIS — E611 Iron deficiency: Secondary | ICD-10-CM

## 2023-12-25 ENCOUNTER — Other Ambulatory Visit: Payer: Self-pay | Admitting: Nurse Practitioner

## 2023-12-25 DIAGNOSIS — G44219 Episodic tension-type headache, not intractable: Secondary | ICD-10-CM

## 2023-12-25 NOTE — Telephone Encounter (Signed)
 Last apt 12/17/23.

## 2024-02-09 ENCOUNTER — Encounter (HOSPITAL_COMMUNITY): Payer: Self-pay | Admitting: Emergency Medicine

## 2024-02-09 ENCOUNTER — Other Ambulatory Visit: Payer: Self-pay

## 2024-02-09 ENCOUNTER — Emergency Department (HOSPITAL_COMMUNITY)

## 2024-02-09 ENCOUNTER — Emergency Department (HOSPITAL_COMMUNITY)
Admission: EM | Admit: 2024-02-09 | Discharge: 2024-02-09 | Disposition: A | Discharge status: Home / Self Care | Attending: Emergency Medicine | Admitting: Emergency Medicine

## 2024-02-09 DIAGNOSIS — M4802 Spinal stenosis, cervical region: Secondary | ICD-10-CM | POA: Diagnosis not present

## 2024-02-09 DIAGNOSIS — F909 Attention-deficit hyperactivity disorder, unspecified type: Secondary | ICD-10-CM | POA: Insufficient documentation

## 2024-02-09 DIAGNOSIS — X58XXXA Exposure to other specified factors, initial encounter: Secondary | ICD-10-CM | POA: Insufficient documentation

## 2024-02-09 DIAGNOSIS — M25551 Pain in right hip: Secondary | ICD-10-CM | POA: Diagnosis not present

## 2024-02-09 DIAGNOSIS — Y9301 Activity, walking, marching and hiking: Secondary | ICD-10-CM | POA: Insufficient documentation

## 2024-02-09 DIAGNOSIS — Z79899 Other long term (current) drug therapy: Secondary | ICD-10-CM | POA: Diagnosis not present

## 2024-02-09 DIAGNOSIS — F32A Depression, unspecified: Secondary | ICD-10-CM | POA: Insufficient documentation

## 2024-02-09 DIAGNOSIS — J45909 Unspecified asthma, uncomplicated: Secondary | ICD-10-CM | POA: Diagnosis not present

## 2024-02-09 DIAGNOSIS — W19XXXA Unspecified fall, initial encounter: Secondary | ICD-10-CM | POA: Insufficient documentation

## 2024-02-09 DIAGNOSIS — S0990XA Unspecified injury of head, initial encounter: Secondary | ICD-10-CM | POA: Diagnosis not present

## 2024-02-09 DIAGNOSIS — Z9889 Other specified postprocedural states: Secondary | ICD-10-CM | POA: Insufficient documentation

## 2024-02-09 DIAGNOSIS — M47812 Spondylosis without myelopathy or radiculopathy, cervical region: Secondary | ICD-10-CM | POA: Diagnosis not present

## 2024-02-09 DIAGNOSIS — R55 Syncope and collapse: Secondary | ICD-10-CM | POA: Diagnosis not present

## 2024-02-09 DIAGNOSIS — S199XXA Unspecified injury of neck, initial encounter: Secondary | ICD-10-CM | POA: Diagnosis not present

## 2024-02-09 LAB — CBC
HCT: 39 % (ref 36.0–46.0)
Hemoglobin: 13 g/dL (ref 12.0–15.0)
MCH: 29.5 pg (ref 26.0–34.0)
MCHC: 33.3 g/dL (ref 30.0–36.0)
MCV: 88.6 fL (ref 80.0–100.0)
Platelets: 299 K/uL (ref 150–400)
RBC: 4.4 MIL/uL (ref 3.87–5.11)
RDW: 12.4 % (ref 11.5–15.5)
WBC: 7.3 K/uL (ref 4.0–10.5)
nRBC: 0 % (ref 0.0–0.2)

## 2024-02-09 LAB — ETHANOL: Alcohol, Ethyl (B): <15 mg/dL (ref <15)

## 2024-02-09 LAB — CBG MONITORING, ED: Glucose-Capillary: 84 mg/dL (ref 70–99)

## 2024-02-09 LAB — URINALYSIS, ROUTINE W REFLEX MICROSCOPIC
Bilirubin Urine: NEGATIVE
Glucose, UA: NEGATIVE mg/dL
Hgb urine dipstick: NEGATIVE
Ketones, ur: NEGATIVE mg/dL
Leukocytes,Ua: NEGATIVE
Nitrite: NEGATIVE
Protein, ur: NEGATIVE mg/dL
Specific Gravity, Urine: 1.004 — ABNORMAL LOW (ref 1.005–1.030)
pH: 7 (ref 5.0–8.0)

## 2024-02-09 LAB — BASIC METABOLIC PANEL WITH GFR
Anion gap: 10 (ref 5–15)
BUN: <5 mg/dL — ABNORMAL LOW (ref 6–20)
CO2: 23 mmol/L (ref 22–32)
Calcium: 9.2 mg/dL (ref 8.9–10.3)
Chloride: 105 mmol/L (ref 98–111)
Creatinine, Ser: 0.67 mg/dL (ref 0.44–1.00)
GFR, Estimated: >60 mL/min (ref >60)
Glucose, Bld: 112 mg/dL — ABNORMAL HIGH (ref 70–99)
Potassium: 3.9 mmol/L (ref 3.5–5.1)
Sodium: 138 mmol/L (ref 135–145)

## 2024-02-09 LAB — PREGNANCY, URINE: Preg Test, Ur: NEGATIVE

## 2024-02-09 LAB — I-STAT CG4 LACTIC ACID, ED: Lactic Acid, Venous: 1.1 mmol/L (ref 0.5–1.9)

## 2024-02-09 NOTE — ED Triage Notes (Signed)
 Pt BIb [facility] from home for syncope with fall Family heard a thud and found pt in floor unconscious.   Pt was unresponsive on EMS arrival to scene.  Family reported seizure-like activity (rigidness vs. Shaking)  Became more alert when loading into truck.  Complaining of right hip pain. A&O at this time.  145/87 100% RA, HR 88 CBG 112  18G L. AC

## 2024-02-09 NOTE — ED Provider Triage Note (Signed)
 Emergency Medicine Provider Triage Evaluation Note  [name] , a 43 y.o. female  was evaluated in triage.  Pt complains of unwitnessed syncope, patient was at home, feeling normal, She remembers she was waking up on the ground, confused, family reports that they heard a thud.  Family reported some rigidness versus shaking, no previous history of seizures.  She became more alert shortly after being loaded into her truck.  No previous history of similar.  She endorses some right hip pain, denies any head or neck pain.  Denies any alcohol, drug use.  Reports that she has been eating and drinking like normal.  Reports some possible frequent urination but reports that she has been drinking a lot of water..  Review of Systems  Positive: syncope Negative:   Physical Exam  BP 131/80   Pulse 84   Temp 98.8 F (37.1 C)   Resp 18   SpO2 100%  Gen:   Awake, no distress   Resp:  Normal effort  MSK:   Moves extremities without difficulty  Other:  Moves all 4 limbs spontaneously, CN II through XII grossly intact, can ambulate without difficulty, intact sensation throughout.  Medical Decision Making  Medically screening exam initiated at 1:10 PM.  Appropriate orders placed.  [name] was informed that the remainder of the evaluation will be completed by another provider, this initial triage assessment does not replace that evaluation, and the importance of remaining in the ED until their evaluation is complete.  Workup initiated in triage    Evelyn Sherlean DEL, PA-C 02/09/24 1316

## 2024-02-09 NOTE — ED Provider Notes (Signed)
 [place] EMERGENCY DEPARTMENT AT [facility] Provider Note   CSN: [id number] Arrival date & time: 02/09/24  1248     Patient presents with: No chief complaint on file.   [name] is a 43 y.o. female Pt complains of unwitnessed syncope, patient was at home, feeling normal. The first thing she remembers she was waking up on the ground, confused, family reports that they heard a thud.  Family reported some rigidness versus shaking, no previous history of seizures.  She became more alert shortly after being loaded into her truck.  No previous history of similar.  She endorses some right hip pain, denies any head or neck pain.  Denies any alcohol, drug use.  Reports that she has been eating and drinking like normal.  Reports some possible frequent urination but reports that she has been drinking a lot of water.[name]   HPI     Prior to Admission medications   Medication Sig Start Date End Date Taking? Authorizing Provider  albuterol  (VENTOLIN  HFA) 108 (90 Base) MCG/ACT inhaler INHALE 1-2 PUFFS BY MOUTH EVERY 6 HOURS AS NEEDED FOR WHEEZE OR SHORTNESS OF BREATH 07/06/23   Early, Sara E, NP  ammonium lactate  (AMLACTIN) 12 % cream Apply 1 Application topically as needed for dry skin. 09/27/23   Gershon Donnice SAUNDERS, DPM  Cholecalciferol (VITAMIN D3) 1.25 MG (50000 UT) CAPS Take 1 capsule by mouth once a week. 11/14/23   [provider]  cyclobenzaprine  (FLEXERIL ) 10 MG tablet TAKE 1 TABLET BY MOUTH DAILY AS NEEDED FOR MUSCLE SPASMS. 12/25/23   Early, Sara E, NP  dicyclomine  (BENTYL ) 20 MG tablet Take 1 tablet (20 mg total) by mouth 3 (three) times daily as needed for spasms (Belly cramping.). 05/03/23   Early, Sara E, NP  pantoprazole  (PROTONIX ) 40 MG tablet Take 1 tablet (40 mg total) by mouth daily. 08/07/23   Mansouraty, Aloha Raddle., MD    Allergies: Amoxicillin, Imitrex [sumatriptan base], Penicillins, Tramadol, Abilify [aripiprazole], Norco [hydrocodone -acetaminophen ],  Percocet [oxycodone -acetaminophen ], and Soy allergy (obsolete)    Review of Systems  All other systems reviewed and are negative.   Updated Vital Signs BP 128/84 (BP Location: Right Arm)   Pulse 85   Temp 98.3 F (36.8 C)   Resp 17   SpO2 100%   Physical Exam Vitals and nursing note reviewed.  Constitutional:      General: She is not in acute distress.    Appearance: Normal appearance.  HENT:     Head: Normocephalic and atraumatic.  Eyes:     General:        Right eye: No discharge.        Left eye: No discharge.  Cardiovascular:     Rate and Rhythm: Normal rate and regular rhythm.     Heart sounds: No murmur heard.    No friction rub. No gallop.  Pulmonary:     Effort: Pulmonary effort is normal.     Breath sounds: Normal breath sounds.     Comments: No wheezing, rhonchi, stridor, rales Abdominal:     General: Bowel sounds are normal.     Palpations: Abdomen is soft.  Skin:    General: Skin is warm and dry.     Capillary Refill: Capillary refill takes less than 2 seconds.  Neurological:     Mental Status: She is alert and oriented to person, place, and time.     Comments: Moves all 4 limbs spontaneously, CN II through XII grossly intact, can ambulate  without difficulty, intact sensation throughout.   Psychiatric:        Mood and Affect: Mood normal.        Behavior: Behavior normal.     (all labs ordered are listed, but only abnormal results are displayed) Labs Reviewed  BASIC METABOLIC PANEL WITH GFR - Abnormal; Notable for the following components:      Result Value   Glucose, Bld 112 (*)    BUN <5 (*)    All other components within normal limits  URINALYSIS, ROUTINE W REFLEX MICROSCOPIC - Abnormal; Notable for the following components:   Color, Urine STRAW (*)    APPearance HAZY (*)    Specific Gravity, Urine 1.004 (*)    All other components within normal limits  CBC  ETHANOL  PREGNANCY, URINE  I-STAT CG4 LACTIC ACID, ED  CBG MONITORING, ED     EKG: None  Radiology: DG Hip Unilat W or Wo Pelvis 2-3 Views Right Result Date: 02/09/2024 CLINICAL DATA:  Fall and right hip pain. EXAM: DG HIP (WITH OR WITHOUT PELVIS) 2-3V RIGHT COMPARISON:  None Available. FINDINGS: There is no evidence of hip fracture or dislocation. There is no evidence of arthropathy or other focal bone abnormality. IMPRESSION: Negative. Electronically Signed   By: Vanetta Chou M.D.   On: 02/09/2024 18:13   CT Cervical Spine Wo Contrast Result Date: 02/09/2024 EXAM: CT CERVICAL SPINE WITHOUT CONTRAST 02/09/2024 01:37:41 PM TECHNIQUE: CT of the cervical spine was performed without the administration of intravenous contrast. Multiplanar reformatted images are provided for review. Automated exposure control, iterative reconstruction, and/or weight based adjustment of the mA/kV was utilized to reduce the radiation dose to as low as reasonably achievable. COMPARISON: MRI cervical spine 03/02/2013. CLINICAL HISTORY: Neck trauma, dangerous injury mechanism (Age [age]y). Pt BIb [facility] from home for syncope with fall Family heard a thud and found pt in floor unconscious. Pt was unresponsive on EMS arrival to scene. Family reported seizure-like activity (rigidness vs. Shaking) Became more alert when loading into truck. ; Complaining of right hip pain. A\T\O at this time. FINDINGS: CERVICAL SPINE: BONES AND ALIGNMENT: Postsurgical changes of suboccipital craniectomy and resection of the C1 posterior arch. No acute fracture or traumatic malalignment. DEGENERATIVE CHANGES: Degenerative endplate osteophytes and disc space narrowing at C5-C6 and C6-C7. Uncovertebral hypertrophy particularly at C5-C6 and C6-C7. No high grade osseous spinal canal stenosis. SOFT TISSUES: No prevertebral soft tissue swelling. IMPRESSION: 1. No acute abnormality of the cervical spine related to neck trauma. 2. Degenerative endplate osteophytes and disc space narrowing at C5-C6 and C6-C7. 3. Postsurgical  changes of suboccipital craniectomy and C1 posterior arch resection. Electronically signed by: Donnice Mania MD 02/09/2024 01:52 PM EDT RP Workstation: [id number]   CT Head Wo Contrast Result Date: 02/09/2024 EXAM: CT HEAD WITHOUT CONTRAST 02/09/2024 01:37:41 PM TECHNIQUE: CT of the head was performed without the administration of intravenous contrast. Automated exposure control, iterative reconstruction, and/or weight based adjustment of the mA/kV was utilized to reduce the radiation dose to as low as reasonably achievable. COMPARISON: CT head 01/11/2023. CLINICAL HISTORY: Head trauma, moderate-severe. Pt BIb [facility] from home for syncope with fall Family heard a thud and found pt in floor unconscious. Pt was unresponsive on EMS arrival to scene. Family reported seizure-like activity (rigidness vs. Shaking) Became more alert when loading into truck. ; Complaining of right hip pain. A\T\O at this time. FINDINGS: BRAIN AND VENTRICLES: No acute hemorrhage. No evidence of acute infarct. No hydrocephalus. No extra-axial collection. No mass effect or  midline shift. ORBITS: No acute abnormality. SINUSES: No acute abnormality. SOFT TISSUES AND SKULL: Post surgical changes of suboccipital craniectomy and resection of the C1 posterior arch. No acute soft tissue abnormality. No skull fracture. IMPRESSION: 1. No acute intracranial abnormality. 2. Postoperative changes of suboccipital craniectomy and C1 posterior arch resection. Electronically signed by: Donnice Mania MD 02/09/2024 01:48 PM EDT RP Workstation: [id number]     Procedures   Medications Ordered in the ED - No data to display                                  Medical Decision Making Amount and/or Complexity of Data Reviewed Labs: ordered. Radiology: ordered.   This patient is a 43 y.o. female  who presents to the ED for concern of syncope, unwitnessed fall, head injury.   Differential diagnoses prior to evaluation: The emergent differential  diagnosis includes, but is not limited to,  CVA, ACS, arrhythmia, vasovagal / orthostatic hypotension, sepsis, hypoglycemia, electrolyte disturbance, respiratory failure, anemia, dehydration, heat injury, polypharmacy, malignancy, anxiety/panic attack. . This is not an exhaustive differential.   Past Medical History / Co-morbidities / Social History: Chiari malformation status post craniectomy.  ADHD, depression, asthma  Physical Exam: Physical exam performed. The pertinent findings include: Mild tenderness to palpation of the right hip, no excess or breath sounds, vital signs stable in the emergency department.  Normal neurologic exam, patient can stand and ambulate without any lightheadedness or dizziness at this time.  Lab Tests/Imaging studies: I personally interpreted labs/imaging and the pertinent results include: CBC unremarkable, BMP unremarkable, UA with somewhat low specific gravity, patient reports that she is been drinking lots of water, otherwise unremarkable.  Negative ethanol, normal CBG, normal lactic acid, negative pregnancy.  With normal lactic acid overall low clinical suspicion for seizure activity.  I independently turbid plain film radiograph of the right hip, CT C-spine, CT head which shows no evidence of acute fracture or injury, some osteophyte formation in the neck, and post craniectomy changes, expected based on history. I agree with the radiologist interpretation.  Cardiac monitoring: EKG obtained and interpreted by myself and attending physician which shows: NSR, no acute ST-T changes   Disposition: After consideration of the diagnostic results and the patients response to treatment, I feel that patient feeling improved, no emergent cause identified for syncope.  This is her first episode of syncope, based on her reassuring evaluation I do think that she is stable for discharge at this time, extensive return precautions given.  She reports that she may have been feeling  stressful, or having a panic attack prior to the syncope, and is wondering if this may have caused it.  Discussed that it is possible that panic attack could have led to the syncope.  emergency department workup does not suggest an emergent condition requiring admission or immediate intervention beyond what has been performed at this time. The plan is: as above. The patient is safe for discharge and has been instructed to return immediately for worsening symptoms, change in symptoms or any other concerns.    Final diagnoses:  Syncope, unspecified syncope type  Injury of head, initial encounter    ED Discharge Orders     None          Rosan Sherlean VEAR DEVONNA 02/09/24 1903    Doretha Folks, MD 02/10/24 1055

## 2024-02-09 NOTE — Discharge Instructions (Signed)
 Workup today was reassuring, there was no evidence of traumatic head or neck injury.  Your lab work was very reassuring, there was no evidence of urinary tract infection.  It is difficult to say why you may have passed out, but you can follow-up closely with your primary care doctor.  If you do have a repeat episode of feeling like you are in a pass out or suddenly passing out in the future I would recommend returning for further evaluation.  It was a pleasure taking care of you today.  Please drink plenty of fluids including electrolyte containing fluids, such as pedialyte, gatorade to help with possible mild dehydration.

## 2024-02-26 ENCOUNTER — Ambulatory Visit

## 2024-02-29 ENCOUNTER — Encounter: Payer: Self-pay | Admitting: Nurse Practitioner

## 2024-03-03 NOTE — Telephone Encounter (Signed)
 Spoke to patient , she states urine is back to normal color now and she will hold off and call back if appt needed

## 2024-03-06 ENCOUNTER — Ambulatory Visit: Payer: Self-pay

## 2024-03-06 NOTE — Telephone Encounter (Addendum)
 FYI Only or Action Required?: FYI only for provider: appointment scheduled on 11/14.  Patient was last seen in primary care on 12/17/2023 by Early, Camie BRAVO, NP.  Called Nurse Triage reporting Recurrent Skin Infections.  Symptoms began several months ago.  Interventions attempted: OTC medications: Cortisone Cream.  Symptoms are: unchanged.  Triage Disposition: See Physician Within 24 Hours  Patient/caregiver understands and will follow disposition?: Yes           Copied from CRM #[id number]. Topic: Clinical - Medication Question >> Mar 06, 2024  3:26 PM Hadassah PARAS wrote: Reason for CRM: Pt is requesting a refill on a cream for folliculitis (does not know name). Please advice [phone] Reason for Disposition  Nursing judgment or information in reference  Answer Assessment - Initial Assessment Questions 1. REASON FOR CALL: What is your main concern right now?    Patient called in stating she has been waxing intermittently since September, and now states she has folliculitis ,and wants cream prescribed by PCP in the past. Patient offered an OV so the provider can properly assess the area of concern. She agrees with plan of care, appt. Scheduled for 11/14  Protocols used: No Guideline Available-A-AH

## 2024-03-07 ENCOUNTER — Ambulatory Visit: Admitting: Nurse Practitioner

## 2024-03-07 ENCOUNTER — Encounter: Payer: Self-pay | Admitting: Nurse Practitioner

## 2024-03-07 VITALS — BP 130/82 | HR 96

## 2024-03-07 DIAGNOSIS — L739 Follicular disorder, unspecified: Secondary | ICD-10-CM

## 2024-03-07 DIAGNOSIS — F603 Borderline personality disorder: Secondary | ICD-10-CM

## 2024-03-07 MED ORDER — KETOCONAZOLE 2 % EX CREA: 1.0000 | TOPICAL_CREAM | Freq: Two times a day (BID) | CUTANEOUS | 3 refills | Status: DC

## 2024-03-07 MED ORDER — BETAMETHASONE DIPROPIONATE 0.05 % EX OINT: TOPICAL_OINTMENT | CUTANEOUS | 3 refills | Status: DC

## 2024-03-07 NOTE — Progress Notes (Signed)
  Evelyn FORBES Doing, DNP, AGNP-c [name] [address] [place], [place] [address] [phone]   ACUTE VISIT on 03/07/2024  Blood pressure 130/82, pulse 96.  Subjective:  HPI  Has been waxing and developed infected hairs. History of Present Illness [name] is a 43 year old female who presents with persistent irritation and itching in the bikini area following waxing.  She experienced irritation and itching in the bikini area after waxing in September, which was followed by swimming and hot tub use. The irritation is described as itchy and burning, with symptoms that fluctuate in intensity. The irritation worsens after physical activity and is exacerbated by wearing panties, causing rubbing and discomfort.  The symptoms have persisted for almost a month, with hair regrowth in the area causing soreness and a darkened appearance. The irritation is more pronounced in the area between her legs and has impacted her ability to have intercourse due to pain.  She has attempted home remedies such as Neosporin without significant improvement. No associated drainage or burning during urination.  She takes multiple vitamins, including B12 and fish oil, and notes changes in urine color with vitamin intake. She is concerned about taking excessive vitamins and mentions a family history of a relative who took excessive vitamins.  ROS negative except for what is listed in HPI. History, Medications, Surgery, SDOH, and Family History reviewed and updated as appropriate.  Objective:  Physical Exam Constitutional:      Appearance: Normal appearance.  Skin:    General: Skin is warm and dry.     Capillary Refill: Capillary refill takes less than 2 seconds.     Findings: Erythema and rash present.  Neurological:     General: No focal deficit present.     Mental Status: She is alert and oriented to person, place, and time.  Psychiatric:        Mood and Affect: Mood normal.          Assessment & Plan:   Problem List Items Addressed This Visit     Borderline personality disorder (HCC)   Exacerbated by shorter days and darker environment during autumn. Currently not on medication but considering resuming treatment. Has a therapist and is managing symptoms independently. - Encouraged to consider resuming medication for depression. - Advised to stay active and maintain a positive outlook.      Other Visit Diagnoses       Folliculitis    -  Primary   Relevant Medications   betamethasone  dipropionate (DIPROLENE ) 0.05 % ointment   ketoconazole (NIZORAL) 2 % cream      Folliculitis related to waxing and hair regrowth Folliculitis likely due to waxing and curly hair regrowth, presenting with itching, burning, and soreness in the bikini area. Symptoms have persisted for almost a month, exacerbated by waxing and hot tub use. No drainage observed. Differential includes folliculitis barbae and irritation from shaving or waxing. - Prescribed betamethasone  cream for itching and healing. - Prescribed ketoconazole cream to be used twice daily with betamethasone . - Advised to mix creams if desired and apply a thin layer to affected area. - Instructed to contact if symptoms do not improve by Monday for further evaluation.  [name] E Evelyn Dolce, DNP, AGNP-c

## 2024-03-07 NOTE — Patient Instructions (Addendum)
 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   When hair is waxed and begins to grow back, the tip of the hair is sharp. In people with curly hair textures, the hair grows out of the skin and begins to curl immediately, causing the sharp tip to cut into the skin.  The hair growing back into the skin (that is already inflamed from the waxing process), causes itching and bumps to form. It can also cause the skin to darken (this is called post inflammatory hyperpigmentation).   The only way to completely stop these from occurring is by stopping shaving and waxing in the area.   You can reduce the amount of inflammation by shaving with an electric razor or using a chemical hair remover. The electric razor does not often give a close shave and the chemical hair remover can cause skin irritation.   You can try shaving with a regular razor in the direction that hair grows (not against the hair growth), but this also has the risk of causing Pseudofolliculitis barbae.  I have sent in betamethasone  and ketoconazole cream to help clear this up. You can use this twice a day.

## 2024-03-10 ENCOUNTER — Encounter: Payer: Self-pay | Admitting: Nurse Practitioner

## 2024-03-10 ENCOUNTER — Other Ambulatory Visit: Payer: Self-pay | Admitting: Nurse Practitioner

## 2024-03-10 DIAGNOSIS — L739 Follicular disorder, unspecified: Secondary | ICD-10-CM

## 2024-03-10 MED ORDER — DOXYCYCLINE HYCLATE 100 MG PO TABS: 100.0000 mg | ORAL_TABLET | Freq: Two times a day (BID) | ORAL | 0 refills | Status: DC

## 2024-03-11 DIAGNOSIS — F603 Borderline personality disorder: Secondary | ICD-10-CM | POA: Insufficient documentation

## 2024-03-11 NOTE — Assessment & Plan Note (Signed)
 Exacerbated by shorter days and darker environment during autumn. Currently not on medication but considering resuming treatment. Has a therapist and is managing symptoms independently. - Encouraged to consider resuming medication for depression. - Advised to stay active and maintain a positive outlook.

## 2024-03-12 ENCOUNTER — Ambulatory Visit (HOSPITAL_COMMUNITY)
Admission: EM | Admit: 2024-03-12 | Discharge: 2024-03-12 | Disposition: A | Discharge status: Home / Self Care | Attending: Psychiatry | Admitting: Psychiatry

## 2024-03-12 DIAGNOSIS — F3170 Bipolar disorder, currently in remission, most recent episode unspecified: Secondary | ICD-10-CM

## 2024-03-12 DIAGNOSIS — Z560 Unemployment, unspecified: Secondary | ICD-10-CM | POA: Insufficient documentation

## 2024-03-12 DIAGNOSIS — F411 Generalized anxiety disorder: Secondary | ICD-10-CM | POA: Insufficient documentation

## 2024-03-12 DIAGNOSIS — F319 Bipolar disorder, unspecified: Secondary | ICD-10-CM | POA: Insufficient documentation

## 2024-03-12 DIAGNOSIS — Z658 Other specified problems related to psychosocial circumstances: Secondary | ICD-10-CM | POA: Insufficient documentation

## 2024-03-12 DIAGNOSIS — Z79899 Other long term (current) drug therapy: Secondary | ICD-10-CM | POA: Insufficient documentation

## 2024-03-12 MED ORDER — LURASIDONE HCL 40 MG PO TABS: 40.0000 mg | ORAL_TABLET | Freq: Every day | ORAL | 0 refills | Status: DC

## 2024-03-12 NOTE — ED Notes (Signed)
 Discharge instructions reviewed w/ pt. Medications, follow-up care and resources reviewed. Pt verbalized understanding. All belongings returned to pt. Pt A&Ox4, ambulatory w/ steady gait and VSS upon departure.

## 2024-03-12 NOTE — ED Provider Notes (Signed)
 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 female. Patient presented voluntarily to [facility] reporting her "mood has been all over the place." She states she was encouraged by her therapist to come in due to worsening emotional regulation and increased anxiety.  Patient has a documented psychiatric history of Bipolar I Disorder and Major Depressive Disorder. She reports increased irritability, mood fluctuations, and anxiety related to psychosocial stressors--primarily loss of employment and limited transportation. Patient reports that she was recently advised by her outpatient provider to start Caplyta, but she did not initiate therapy after researching potential side effects.  Patient reports past inpatient psychiatric admission in 2021 for mania. Her most recent medication was Celexa , discontinued in July 2025 due to perceived lack of benefit. She reports previous difficulty tolerating antipsychotics during hospitalization and experienced stroke-like symptoms at that time.  She denies current suicidal ideation, homicidal ideation, auditory or visual hallucinations, and paranoia at the time of evaluation. She does acknowledge intermittent intrusive negative thoughts and feelings of inadequacy under stress but denies intent or plan to harm herself or others.  Collateral history provided by husband indicates mild mood instability and increased worry consistent with stressors. He confirms patient is safe to discharge, engages in coping activities, attends weekly therapy, has no access to firearms, and does not use alcohol or substances.  Patient is not acutely  psychotic, suicidal, or homicidal. She is able to contract for safety and has stable home supports. No immediate safety concerns identified. Protective factors include long-term marriage, family support, engagement in care, coping skills, and future orientation.  Plan: Discharge home with husband.  Initiate Latuda  20 mg PO daily with evening meal to target mood instability, anxiety, and bipolar symptoms. Discussed benefits, administration with food for absorption, and comparatively favorable metabolic profile. Sent 30-day supply of medication to preferred pharmacy.  Encouraged continued participation in weekly psychotherapy.  Discussed Intensive Outpatient Program (IOP) as an additional option; patient will consider pending schedule changes.  Patient instructed to follow up with [facility] for ongoing psychiatric medication management, preferably before holidays or immediately after her job transition.  Reviewed crisis instructions: return to [facility] or nearest ED for acute worsening, inability to function, suicidal or homicidal ideation, severe insomnia, or medication intolerance.  Safety Patient and husband confirm no access to weapons. Husband agrees to monitor patient and report any concerning changes. Patient able to verbalize safety plan and coping strategies.  Flowsheet Row ED from 03/12/2024 in [facility] ED from 02/09/2024 in [facility] Emergency Department at [facility] ED from 01/20/2023 in [facility] Department at [facility]  C-SSRS RISK CATEGORY No Risk No Risk No Risk    Psychiatric Specialty Exam  Presentation  General Appearance:Appropriate for Environment  Eye Contact:Good  Speech:Clear and Coherent  Speech Volume:Normal  Handedness:No data recorded  Mood and Affect  Mood: Anxious  Affect: Appropriate   Thought Process  Thought Processes: Coherent  Descriptions of  Associations:Intact  Orientation:Full (Time, Place and Person)  Thought Content:WDL; Tangential  Diagnosis of Schizophrenia or Schizoaffective disorder in past: No data recorded  Hallucinations:None  Ideas of Reference:None  Suicidal Thoughts:No  Homicidal Thoughts:No   Sensorium  Memory: Immediate Fair  Judgment: Good  Insight: Good   Executive Functions  Concentration: Good  Attention Span: Good  Recall: [name] of Knowledge: Fair  Language: Good   Psychomotor Activity  Psychomotor Activity: Normal   Assets  Assets: Communication Skills; Desire for Improvement; Financial Resources/Insurance; Social Support; Intimacy; Housing; Resilience   Sleep  Sleep: Good  Number of hours: No data recorded  Physical Exam: Physical Exam ROS Blood pressure (!) 127/108, pulse 98, temperature 98.6 F (37 C), temperature source Oral, resp. rate 18, SpO2 100%. There is no height or weight on file to calculate BMI.  Musculoskeletal: Strength & Muscle Tone: within normal limits Gait & Station: normal Patient leans: N/A   [facility] MSE Discharge Disposition for Follow up and Recommendations: Based on my evaluation the patient does not appear to have an emergency medical condition and can be discharged with resources and follow up care in outpatient services for Medication Management and Individual Therapy Patient is psychiatrically stable at this time, denies safety concerns, demonstrates insight and willingness to engage in treatment, and has an appropriate support system. Patient is discharged home in care of her husband.  [name], PMHNP 03/12/2024, 6:17 PM

## 2024-03-12 NOTE — Discharge Instructions (Addendum)
  Discharge recommendations:  Patient is to take medications as prescribed. Please see information for follow-up appointment with psychiatry and therapy. Please follow up with your primary care provider for all medical related needs.   Therapy: We recommend that patient participate in individual therapy to address mental health concerns.  Medications: The patient or guardian is to contact a medical professional and/or outpatient provider to address any new side effects that develop. The patient or guardian should update outpatient providers of any new medications and/or medication changes.   Atypical antipsychotics: If you are prescribed an atypical antipsychotic, it is recommended that your height, weight, BMI, blood pressure, fasting lipid panel, and fasting blood sugar be monitored by your outpatient providers.  Safety:  The patient should abstain from use of illicit substances/drugs and abuse of any medications. If symptoms worsen or do not continue to improve or if the patient becomes actively suicidal or homicidal then it is recommended that the patient return to the closest hospital emergency department, the [facility], or call 911 for further evaluation and treatment. National Suicide Prevention Lifeline 1-800-SUICIDE or (612) 269-2738.  About 988 988 offers 24/7 access to trained crisis counselors who can help people experiencing mental health-related distress. People can call or text 988 or chat 988lifeline.org for themselves or if they are worried about a loved one who may need crisis support.  Crisis Mobile: [facility]:                     734 710 3128 (for crisis response 24 hours a day) [facility] Hotline:                                            240-834-1733   Safety Plan Kelsha TAJANAY HURLEY will reach out to her husband Marg Macmaster, call 911 or call mobile crisis, or go to nearest emergency room if condition worsens or  if suicidal thoughts become active Patients' will follow up with [facility] for outpatient psychiatric services (therapy/medication management).  The suicide prevention education provided includes the following: Suicide risk factors Suicide prevention and interventions National Suicide Hotline telephone number [facility] assessment telephone number [facility] Emergency Assistance 911 [facility] and/or Residential Mobile Crisis Unit telephone number Request made of family/significant other to:  husband Damon Water Quality Scientist (e.g., guns, rifles, knives), all items previously/currently identified as safety concern.   Remove drugs/medications (over the counter, prescriptions, illicit drugs), all items previously/currently identified as a safety concern.

## 2024-03-12 NOTE — Progress Notes (Signed)
   03/12/24 1626  [facility] Triage Screening (Walk-ins at [facility] only)  How Did You Hear About Us ? Other (Comment) (Therapist)  What Is the Reason for Your Visit/Call Today? [name] is a 36Y female presenting to [facility] as a vol walk-in. Pt states that her therapist told her that she should come in and be seen. Pt states that she has bee feeling like a maniac. Pt states that she has been off her meds since July and would like to be started on new medications. Pt has a hx of Bipolar 1, BPD, and MDD. Pt denies SI, HI, AVH, and substance use.  How Long Has This Been Causing You Problems? 1-6 months  Have You Recently Had Any Thoughts About Hurting Yourself? No  Are You Planning to Commit Suicide/Harm Yourself At This time? No  Have you Recently Had Thoughts About Hurting Someone Sherral? No  Are You Planning To Harm Someone At This Time? No  Are you currently experiencing any auditory, visual or other hallucinations? No  Have You Used Any Alcohol or Drugs in the Past 24 Hours? No  Do you have any current medical co-morbidities that require immediate attention? No  What Do You Feel Would Help You the Most Today? Medication(s)  Determination of Need Routine (7 days)  Options For Referral Medication Management;Outpatient Therapy

## 2024-03-14 ENCOUNTER — Ambulatory Visit (HOSPITAL_COMMUNITY)
Admission: EM | Admit: 2024-03-14 | Discharge: 2024-03-14 | Disposition: A | Discharge status: Home / Self Care | Attending: Psychiatry | Admitting: Psychiatry

## 2024-03-14 DIAGNOSIS — Z79899 Other long term (current) drug therapy: Secondary | ICD-10-CM | POA: Insufficient documentation

## 2024-03-14 DIAGNOSIS — R400 Somnolence: Secondary | ICD-10-CM | POA: Insufficient documentation

## 2024-03-14 NOTE — ED Provider Notes (Cosign Needed Addendum)
 [facility] Medical Screening Exam  Patient Name: [name] MRN: [id number] Date of Evaluation: 03/14/24 Chief Complaint:   Diagnosis:  Final diagnoses:  Medication management    History of Present illness: [name] is a 43 y.o. female.  Per TTS:  Patient reports to [facility] voluntarily due to increased anxiety. She reports she was started on Latuda  on 11/19 which has caused some drowsiness. She states she usually takes benadryl  to help with sleep and took 1 tablet along with her latuda  and looked online and realized she was not supposed to take them together and became anxious. Patient has a documented history of BPD,Bipolar disorder, panic disorder, ADHD, and MDD. She states she is established with outpatient therapy at [facility] counseling but is not established with outpatient psychatry services. Patient denies SI/HI and AVH.  Per chart review, patient was evaluated here on 03/12/2024  when she presented with worsening emotional regulation and increased anxiety. She reported increased irritability, mood fluctuations, and anxiety related to psychosocial stressors including employment loss and limited transportation. She was recommended to start taking Latuda  20 mg at dinner time daily. Patient presented with a hx of inpatient hospitalization in 2021 for mania. She reported hx of taking Celexa   which she stopped taking in July 2025 as it was not helping. She reported hx of psychotropic medication intolerance with stroke-like symptoms as side effects.  Today, she reports that she is concerned about her well-being  after taking a Benadryl  and Latuda  together, and started feeling drowsy and tired. Patient searched  information  on Google about the two medications and  started feeling worried about what could happen to her.  She denies SI/HI/AVH.   Assessment: 43 year-old female who appears to be her stated age. She is appropriately dressed and groomed. Appears  healthy and well-nourished. Alert and oriented x 4. Does not appear preoccupied or responding to internal stimuli. She engages in this assessment appropriately, pleasant and cooperative. Maintains eye contact and her thought process is coherent. She denies current SI but reports a hx. Denies HI/AVH.  Patient does appear tired and sleepy and reports she felt like this after taking the two medications together (Benadryl  25 mg and Latuda  20 mg. Reports she was feeling anxious and decided to take Benadryl  OTC. Patient became concerned and came here for evaluation to ensure of her safety. She presents with no acute distress appears and reports feeling tired.   Patient was recently prescribed a mood stabilizer Latuda  and she has taken it twice only so far, and drowsiness/dizziness are some of  possible side effects from this medication.  In addition, patient added Benadryl   (25 mg) which presents about the same side effects. Patient reports no additional symptoms and states I just wanted to make sure I am going to be OK. She reports no safety issues at home and states she is not planning to drive, that her husband is driving. She is encouraged to rest as soon as she gets home, and to check with her provider before  using OTC medications.   Patient reports that she currently has a therapist that she sees on a regular basis and has no concerns about the service. She expresses her desire to receive medication management services at [facility] services: Information provided. Crisis and safety plan discussed with patient: She is encouraged to return to this service or any other close emergency service should her symptoms worsen. Patient reports no weapons in the house. Patient reports feeling supported  and planning to start a new employment Monday. Encouragements and support provided.  Patient has no sign of distress upon discharge.    Flowsheet Row ED from 03/14/2024 in [facility]  ED from 03/12/2024 in [facility] ED from 02/09/2024 in [facility] Emergency Department at [facility]  C-SSRS RISK CATEGORY No Risk No Risk No Risk    Psychiatric Specialty Exam  Presentation  General Appearance:Appropriate for Environment  Eye Contact:Good  Speech:Clear and Coherent  Speech Volume:Normal  Handedness:Right   Mood and Affect  Mood: Anxious  Affect: Appropriate   Thought Process  Thought Processes: Coherent  Descriptions of Associations:Intact  Orientation:Full (Time, Place and Person)  Thought Content:WDL  Diagnosis of Schizophrenia or Schizoaffective disorder in past: No data recorded  Hallucinations:None  Ideas of Reference:None  Suicidal Thoughts:No  Homicidal Thoughts:No   Sensorium  Memory: Immediate Fair; Recent Fair; Remote Fair  Judgment: Fair  Insight: Fair   Art Therapist  Concentration: Fair  Attention Span: Fair  Recall: [employer] of Knowledge: Fair  Language: Fair   Psychomotor Activity  Psychomotor Activity: Normal   Assets  Assets: Manufacturing Systems Engineer; Desire for Improvement; Financial Resources/Insurance; Housing; Physical Health; Social Support   Sleep  Sleep: Good  Number of hours: No data recorded  Physical Exam: Physical Exam Vitals and nursing note reviewed.  Constitutional:      Appearance: Normal appearance.  HENT:     Head: Normocephalic and atraumatic.     Right Ear: Tympanic membrane normal.     Left Ear: Tympanic membrane normal.     Nose: Nose normal.     Mouth/Throat:     Mouth: Mucous membranes are moist.  Eyes:     Extraocular Movements: Extraocular movements intact.     Pupils: Pupils are equal, round, and reactive to light.  Cardiovascular:     Rate and Rhythm: Normal rate.     Pulses: Normal pulses.  Pulmonary:     Effort: Pulmonary effort is normal.  Musculoskeletal:        General: Normal range of motion.      Cervical back: Normal range of motion and neck supple.  Neurological:     General: No focal deficit present.     Mental Status: She is alert and oriented to person, place, and time.  Psychiatric:        Thought Content: Thought content normal.    Review of Systems  Constitutional: Negative.   HENT: Negative.    Eyes: Negative.   Respiratory: Negative.    Cardiovascular: Negative.   Gastrointestinal: Negative.   Genitourinary: Negative.   Skin: Negative.   Neurological: Negative.   Endo/Heme/Allergies: Negative.   Psychiatric/Behavioral:  The patient is nervous/anxious.    Blood pressure 123/69, pulse 100, temperature 98.4 F (36.9 C), temperature source Oral, resp. rate 18, SpO2 100%. There is no height or weight on file to calculate BMI.  Musculoskeletal: Strength & Muscle Tone: within normal limits Gait & Station: normal Patient leans: N/A   [facility] MSE Discharge Disposition for Follow up and Recommendations: Based on my evaluation the patient does not appear to have an emergency medical condition and can be discharged with resources and follow up care in outpatient services for Medication Management, Individual Therapy, and Group Therapy   Randall Bouquet, NP 03/14/2024, 9:19 PM

## 2024-03-14 NOTE — Progress Notes (Signed)
   03/14/24 1951  [facility] Triage Screening (Walk-ins at [facility] only)  How Did You Hear About Us ? Family/Friend  What Is the Reason for Your Visit/Call Today? Patient reports to [facility] voluntarily due to increased anxiety. She reports she was started on Latuda  on 11/19 which has caused some drowsiness. She states she usually takes benadryl  to help with sleep and took 1 tablet along with her latuda  and looked online and realized she was not supposed to take them together and became anxious.  Patient has a documented history of BPD,Bipolar disorder, panic disorder, ADHD, and MDD. She states she is established with outpatient therapy at [facility] counseling but is not established with outpatient psychatry services. Patient denies SI/HI and AVH.  How Long Has This Been Causing You Problems? <Week  Have You Recently Had Any Thoughts About Hurting Yourself? No  Are You Planning to Commit Suicide/Harm Yourself At This time? No  Have you Recently Had Thoughts About Hurting Someone Sherral? No  Are You Planning To Harm Someone At This Time? No  Physical Abuse Denies  Verbal Abuse Denies  Sexual Abuse Denies  Exploitation of patient/patient's resources Denies  Self-Neglect Denies  Are you currently experiencing any auditory, visual or other hallucinations? No  Have You Used Any Alcohol or Drugs in the Past 24 Hours? No  Do you have any current medical co-morbidities that require immediate attention? No  Clinician description of patient physical appearance/behavior: calm, cooperative, drowsy  What Do You Feel Would Help You the Most Today? Medication(s);Stress Management  If access to [facility] was not available, would you have sought care in the Emergency Department? No  Determination of Need Routine (7 days)  Options For Referral Medication Management;Outpatient Therapy

## 2024-03-14 NOTE — Discharge Instructions (Signed)

## 2024-03-18 ENCOUNTER — Other Ambulatory Visit: Payer: Self-pay

## 2024-03-18 DIAGNOSIS — E538 Deficiency of other specified B group vitamins: Secondary | ICD-10-CM

## 2024-03-18 DIAGNOSIS — E611 Iron deficiency: Secondary | ICD-10-CM

## 2024-03-19 LAB — CBC WITH DIFFERENTIAL/PLATELET
Basophils Absolute: 0.1 x10E3/uL (ref 0.0–0.2)
Basos: 1 %
EOS (ABSOLUTE): 0.1 x10E3/uL (ref 0.0–0.4)
Eos: 2 %
Hematocrit: 39.4 % (ref 34.0–46.6)
Hemoglobin: 13 g/dL (ref 11.1–15.9)
Immature Grans (Abs): 0 x10E3/uL (ref 0.0–0.1)
Immature Granulocytes: 0 %
Lymphocytes Absolute: 1.9 x10E3/uL (ref 0.7–3.1)
Lymphs: 34 %
MCH: 30 pg (ref 26.6–33.0)
MCHC: 33 g/dL (ref 31.5–35.7)
MCV: 91 fL (ref 79–97)
Monocytes Absolute: 0.5 x10E3/uL (ref 0.1–0.9)
Monocytes: 9 %
Neutrophils Absolute: 3 x10E3/uL (ref 1.4–7.0)
Neutrophils: 54 %
Platelets: 322 x10E3/uL (ref 150–450)
RBC: 4.34 x10E6/uL (ref 3.77–5.28)
RDW: 12.2 % (ref 11.7–15.4)
WBC: 5.5 x10E3/uL (ref 3.4–10.8)

## 2024-03-19 LAB — IRON,TIBC AND FERRITIN PANEL
Ferritin: 79 ng/mL (ref 15–150)
Iron Saturation: 33 % (ref 15–55)
Iron: 111 ug/dL (ref 27–159)
Total Iron Binding Capacity: 337 ug/dL (ref 250–450)
UIBC: 226 ug/dL (ref 131–425)

## 2024-03-19 LAB — VITAMIN B12: Vitamin B-12: 423 pg/mL (ref 232–1245)

## 2024-03-27 ENCOUNTER — Ambulatory Visit

## 2024-03-30 ENCOUNTER — Other Ambulatory Visit: Payer: Self-pay | Admitting: Nurse Practitioner

## 2024-03-31 ENCOUNTER — Ambulatory Visit

## 2024-03-31 VITALS — Ht 64.0 in | Wt 182.0 lb

## 2024-03-31 DIAGNOSIS — Z0000 Encounter for general adult medical examination without abnormal findings: Secondary | ICD-10-CM | POA: Diagnosis not present

## 2024-03-31 DIAGNOSIS — Z1231 Encounter for screening mammogram for malignant neoplasm of breast: Secondary | ICD-10-CM

## 2024-03-31 NOTE — Patient Instructions (Addendum)
 [name],  Thank you for taking the time for your Medicare Wellness Visit. I appreciate your continued commitment to your health goals. Please review the care plan we discussed, and feel free to reach out if I can assist you further.  Please note that Annual Wellness Visits do not include a physical exam. Some assessments may be limited, especially if the visit was conducted virtually. If needed, we may recommend an in-person follow-up with your provider.  Ongoing Care Seeing your primary care provider every 3 to 6 months helps us  monitor your health and provide consistent, personalized care.   Referrals If a referral was made during today's visit and you haven't received any updates within two weeks, please contact the referred provider directly to check on the status.  Recommended Screenings:  Health Maintenance  Topic Date Due   Pneumococcal Vaccine (1 of 2 - PCV) Never done   Hepatitis B Vaccine (1 of 3 - 19+ 3-dose series) Never done   HPV Vaccine (1 - 3-dose SCDM series) Never done   COVID-19 Vaccine (2 - Pfizer risk series) 04/21/2022   Breast Cancer Screening  08/06/2023   Flu Shot  07/22/2024*   Medicare Annual Wellness Visit  03/31/2025   Pap with HPV screening  02/01/2028   DTaP/Tdap/Td vaccine (3 - Td or Tdap) 01/10/2033   HIV Screening  Completed   Meningitis B Vaccine  Aged Out   Hepatitis C Screening  Discontinued  *Topic was postponed. The date shown is not the original due date.       03/31/2024    3:46 PM  Advanced Directives  Does Patient Have a Medical Advance Directive? No  Would patient like information on creating a medical advance directive? No - Patient declined    Vision: Annual vision screenings are recommended for early detection of glaucoma, cataracts, and diabetic retinopathy. These exams can also reveal signs of chronic conditions such as diabetes and high blood pressure.  Dental: Annual dental screenings help detect early signs of oral  cancer, gum disease, and other conditions linked to overall health, including heart disease and diabetes.  Please see the attached documents for additional preventive care recommendations.

## 2024-03-31 NOTE — Progress Notes (Signed)
 Chief Complaint  Patient presents with   Medicare Wellness     Subjective:   [name] is a 43 y.o. female who presents for a Medicare Annual Wellness Visit.  Visit info / Clinical Intake: Medicare Wellness Visit Type:: Subsequent Annual Wellness Visit Persons participating in visit and providing information:: patient Medicare Wellness Visit Mode:: Telephone If telephone:: video declined Since this visit was completed virtually, some vitals may be partially provided or unavailable. Missing vitals are due to the limitations of the virtual format.: Documented vitals are patient reported If Telephone or Video please confirm:: I connected with patient using audio/video enable telemedicine. I verified patient identity with two identifiers, discussed telehealth limitations, and patient agreed to proceed. Patient Location:: Home Provider Location:: Office Interpreter Needed?: No Pre-visit prep was completed: no AWV questionnaire completed by patient prior to visit?: no Living arrangements:: lives with spouse/significant other Patient's Overall Health Status Rating: (!) fair Typical amount of pain: none Does pain affect daily life?: no Are you currently prescribed opioids?: no  Dietary Habits and Nutritional Risks How many meals a day?: 2 Eats fruit and vegetables daily?: yes Most meals are obtained by: preparing own meals In the last 2 weeks, have you had any of the following?: none Diabetic:: no  Functional Status Activities of Daily Living (to include ambulation/medication): Independent Ambulation: Independent with device- listed below Home Assistive Devices/Equipment: Dentures (specify type); Eyeglasses Medication Administration: Independent Home Management (perform basic housework or laundry): Independent Manage your own finances?: yes Primary transportation is: driving Concerns about vision?: no *vision screening is required for WTM* Concerns about hearing?:  no  Fall Screening Falls in the past year?: 0 Number of falls in past year: 0 Was there an injury with Fall?: 0 Fall Risk Category Calculator: 0 Patient Fall Risk Level: Low Fall Risk  Fall Risk Patient at Risk for Falls Due to: No Fall Risks Fall risk Follow up: Falls evaluation completed  Home and Transportation Safety: All rugs have non-skid backing?: yes All stairs or steps have railings?: yes Grab bars in the bathtub or shower?: (!) no Have non-skid surface in bathtub or shower?: (!) no Good home lighting?: yes Regular seat belt use?: yes Hospital stays in the last year:: no  Cognitive Assessment Difficulty concentrating, remembering, or making decisions? : yes Will 6CIT or Mini Cog be Completed: yes What year is it?: 0 points What month is it?: 0 points Give patient an address phrase to remember (5 components): [name] About what time is it?: 0 points Count backwards from 20 to 1: 0 points Say the months of the year in reverse: 2 points Repeat the address phrase from earlier: 4 points 6 CIT Score: 6 points  Advance Directives (For Healthcare) Does Patient Have a Medical Advance Directive?: No Would patient like information on creating a medical advance directive?: No - Patient declined  Reviewed/Updated  Reviewed/Updated: Reviewed All (Medical, Surgical, Family, Medications, Allergies, Care Teams, Patient Goals)    Allergies (verified) Amoxicillin, Imitrex [sumatriptan base], Penicillins, Tramadol, Abilify [aripiprazole], Norco [hydrocodone -acetaminophen ], Percocet [oxycodone -acetaminophen ], and Soy allergy (obsolete)   Current Medications (verified) Outpatient Encounter Medications as of 03/31/2024  Medication Sig   albuterol  (VENTOLIN  HFA) 108 (90 Base) MCG/ACT inhaler INHALE 1-2 PUFFS BY MOUTH EVERY 6 HOURS AS NEEDED FOR WHEEZE OR SHORTNESS OF BREATH   ammonium lactate  (AMLACTIN) 12 % cream Apply 1 Application topically as needed for dry skin.    betamethasone  dipropionate (DIPROLENE ) 0.05 % ointment Apply a thin layer  to the skin twice a day for 1 week, then break for 5 days, then restart if symptoms are still there.   Cholecalciferol (VITAMIN D3) 1.25 MG (50000 UT) CAPS Take 1 capsule by mouth once a week.   dicyclomine  (BENTYL ) 20 MG tablet Take 1 tablet (20 mg total) by mouth 3 (three) times daily as needed for spasms (Belly cramping.).   doxycycline  (VIBRA -TABS) 100 MG tablet Take 1 tablet (100 mg total) by mouth 2 (two) times daily. (Patient not taking: Reported on 03/31/2024)   ketoconazole  (NIZORAL ) 2 % cream Apply 1 Application topically 2 (two) times daily. Until symptoms are gone.   lurasidone  (LATUDA ) 40 MG TABS tablet Take 1 tablet (40 mg total) by mouth daily with supper. (Patient not taking: Reported on 03/31/2024)   pantoprazole  (PROTONIX ) 40 MG tablet Take 1 tablet (40 mg total) by mouth daily.   No facility-administered encounter medications on file as of 03/31/2024.    History: Past Medical History:  Diagnosis Date   Acute vaginitis 03/31/2022   Anemia    h/o   Anginal pain    pt. takes nitrostat - as needed, hasn't had since 04/2012, sees Dr. 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 2013   Nausea without vomiting 08/07/2023   Neuromuscular disorder (HCC)    muscle spasms-back & arms    Other fatigue 02/21/2022   Palpitations 12/17/2023   Papanicolaou smear for cervical cancer screening 02/19/2023   Paresthesias 12/17/2023   Sensation of feeling cold 12/17/2023   SVT (supraventricular tachycardia) 01/22/2012   Tachycardia 12/17/2023   UTI (lower urinary tract infection) 01/20/2012    Vision changes 12/11/2021   Past Surgical History:  Procedure Laterality Date   ABDOMINAL HYSTERECTOMY     PERIPHERALLY INSERTED CENTRAL CATHETER INSERTION     during event of Meningitis- then d/c'd to home /w & treated /w antibiotics    SUBOCCIPITAL CRANIECTOMY CERVICAL LAMINECTOMY N/A 07/22/2012   Procedure: SUBOCCIPITAL CRANIECTOMY CERVICAL LAMINECTOMY/DURAPLASTY;  Surgeon: Lamar LELON Peaches, MD;  Location: [facility];  Service: Neurosurgery;  Laterality: N/A;  Suboccipital craniectomy with upper cervial laminectomy with duroplasty    TUBAL LIGATION     VAGINAL DELIVERY     x3   WISDOM TOOTH EXTRACTION     Family History  Problem Relation Age of Onset   Heart attack Mother    Healthy Father    Colon cancer Neg Hx    Esophageal cancer Neg Hx    Social History   Occupational History   Not on file  Tobacco Use   Smoking status: Never    Passive exposure: Never   Smokeless tobacco: Never  Vaping Use   Vaping status: Never Used  Substance and Sexual Activity   Alcohol use: No   Drug use: No   Sexual activity: Yes    Birth control/protection: Surgical   Tobacco Counseling Counseling given: No  SDOH Screenings   Food Insecurity: No Food Insecurity (03/31/2024)  Housing: Unknown (03/31/2024)  Transportation Needs: No Transportation Needs (03/31/2024)  Utilities: Not At Risk (03/31/2024)  Depression (PHQ2-9): Low Risk  (03/31/2024)  Financial Resource Strain: Medium Risk (08/13/2023)  Physical Activity: Insufficiently Active (03/31/2024)  Social Connections: Socially Integrated (03/31/2024)  Stress: Stress Concern Present (03/31/2024)  Tobacco Use: Low Risk  (03/31/2024)  Health Literacy: Adequate Health Literacy (03/31/2024)   See flowsheets for full screening details  Depression Screen PHQ 2 & 9 Depression Scale- Over the past 2 weeks, how often have you been bothered by any of the following problems? Little interest or pleasure in doing things: 1 Feeling down, depressed,  or hopeless (PHQ Adolescent also includes...irritable): 1 PHQ-2 Total Score: 2 Trouble falling or staying asleep, or sleeping too much: 0 Feeling tired or having little energy: 0 Poor appetite or overeating (PHQ Adolescent also includes...weight loss): 0 Feeling bad about yourself - or that you are a failure or have let yourself or your family down: 1 Trouble concentrating on things, such as reading the newspaper or watching television (PHQ Adolescent also includes...like school work): 1 Moving or speaking so slowly that other people could have noticed. Or the opposite - being so fidgety or restless that you have been moving around a lot more than usual: 0 Thoughts that you would be better off dead, or of hurting yourself in some way: 0 PHQ-9 Total Score: 4 If you checked off any problems, how difficult have these problems made it for you to do your work, take care of things at home, or get along with other people?: Somewhat difficult  Depression Treatment Depression Interventions/Treatment : Currently on Treatment     Goals Addressed               This Visit's Progress     Increase physical activity (pt-stated)        Lose more weight.             Objective:    Today's Vitals   03/31/24 1545  Weight: 182 lb (82.6 kg)  Height: 5' 4 (1.626 m)   Body mass index is 31.24 kg/m.  Hearing/Vision screen Hearing Screening - Comments:: Denies hearing difficulties   Vision Screening - Comments:: Wears rx glasses - up to date with routine eye exams with  Dr Abigail Immunizations and Health Maintenance Health Maintenance  Topic Date Due   Pneumococcal Vaccine (1 of 2 - PCV) Never done   Hepatitis B Vaccines 19-59 Average Risk (1 of 3 - 19+ 3-dose series) Never done   HPV VACCINES (1 - 3-dose SCDM series) Never done   COVID-19 Vaccine (2 - Pfizer risk series) 04/21/2022   Mammogram  08/06/2023   Influenza Vaccine  07/22/2024 (Originally 11/23/2023)   Medicare Annual Wellness  (AWV)  03/31/2025   Cervical Cancer Screening (HPV/Pap Cotest)  02/01/2028   DTaP/Tdap/Td (3 - Td or Tdap) 01/10/2033   HIV Screening  Completed   Meningococcal B Vaccine  Aged Out   Hepatitis C Screening  Discontinued        Assessment/Plan:  This is a routine wellness examination for [name].  Patient Care Team: Early, Camie BRAVO, NP as PCP - General (Nurse Practitioner) Levern Hutching, MD as PCP - Cardiology (Cardiology)  I have personally reviewed and noted the following in the patient's chart:   Medical and social history Use of alcohol, tobacco or illicit drugs  Current medications and supplements including opioid prescriptions. Functional ability and status Nutritional status Physical activity Advanced directives List of other physicians Hospitalizations, surgeries, and ER visits in previous 12 months Vitals Screenings to include cognitive, depression, and falls Referrals and appointments  Orders Placed This Encounter  Procedures   MM 3D  SCREENING MAMMOGRAM BILATERAL BREAST    Standing Status:   Future    Expiration Date:   03/31/2025    Reason for Exam (SYMPTOM  OR DIAGNOSIS REQUIRED):   Breast cancer screening    Is the patient pregnant?:   No    Preferred imaging location?:   [facility]   In addition, I have reviewed and discussed with patient certain preventive protocols, quality metrics, and best practice recommendations. A written personalized care plan for preventive services as well as general preventive health recommendations were provided to patient.   Rojelio LELON Blush, LPN   [id number]/04/[id number]   Return in 1 year on 04/07/25  After Visit Summary: (MyChart) Due to this being a telephonic visit, the after visit summary with patients personalized plan was offered to patient via MyChart   Nurse Notes: None

## 2024-04-01 ENCOUNTER — Ambulatory Visit: Payer: Self-pay

## 2024-04-02 ENCOUNTER — Ambulatory Visit: Payer: Self-pay | Admitting: Nurse Practitioner

## 2024-04-02 DIAGNOSIS — R519 Headache, unspecified: Secondary | ICD-10-CM

## 2024-04-02 DIAGNOSIS — M62838 Other muscle spasm: Secondary | ICD-10-CM

## 2024-04-03 ENCOUNTER — Encounter (HOSPITAL_COMMUNITY): Payer: Self-pay

## 2024-04-03 ENCOUNTER — Ambulatory Visit (HOSPITAL_COMMUNITY): Admission: EM | Admit: 2024-04-03 | Discharge: 2024-04-03 | Disposition: A | Discharge status: Home / Self Care

## 2024-04-03 ENCOUNTER — Ambulatory Visit: Payer: Self-pay

## 2024-04-03 DIAGNOSIS — F410 Panic disorder [episodic paroxysmal anxiety] without agoraphobia: Secondary | ICD-10-CM

## 2024-04-03 NOTE — ED Triage Notes (Signed)
 Patient presenting with symptoms of a panic attack. States she felt nervous and vomited. States in the process of getting a psychiatrist. States she had a panic attack yesterday, took a benadryl  and then went to sleep.   States she was L[place]uda a month ago but got off due to an adverse reaction.

## 2024-04-03 NOTE — ED Provider Notes (Signed)
 [facility]    CSN: [id number] Arrival date & time: 04/03/24  9166      History   Chief Complaint Chief Complaint  Patient presents with   Panic Attack    HPI [name] is a 43 y.o. female.   Patient presents today with panic attack.  Reports she had a panic attack last night and took Benadryl  and went to sleep which made her feel better.  Reports she has history of anxiety, depression, and has been prescribed medicine in the past.  Most recently, she took Latuda  but it made her suicidal so she stopped taking it.  She denies any current suicidal ideation or homicidal ideation.  Reports she is feeling more anxious lately, states that she will stare in the mirror at herself for hours worried that she may have skin cancer.  Thinks she has little bumps all over her face and is concerned that they may be skin cancer.  Also concerned because her husband was recently sick so is worried she may be sick.  She denies fever, cough, congestion, sore throat.  Reports she has allergies and that is why she takes Benadryl  occasionally as well.    Past Medical History:  Diagnosis Date   Acute vaginitis 03/31/2022   Anemia    h/o   Anginal pain    pt. takes nitrostat - as needed, hasn't had since 04/2012, sees Dr. 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 2013   Nausea without vomiting 08/07/2023   Neuromuscular disorder (HCC)    muscle spasms-back & arms    Other fatigue 02/21/2022   Palpitations 12/17/2023   Papanicolaou smear for cervical cancer screening 02/19/2023   Paresthesias 12/17/2023   Sensation of feeling cold 12/17/2023   SVT  (supraventricular tachycardia) 01/22/2012   Tachycardia 12/17/2023   UTI (lower urinary tract infection) 01/20/2012   Vision changes 12/11/2021    Patient Active Problem List   Diagnosis Date Noted   Borderline personality disorder (HCC) 03/11/2024   Perimenopausal symptoms 05/09/2023   Encounter for annual physical exam 02/19/2023   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/19/2023   Gastroesophageal reflux disease 03/31/2022   Attention deficit hyperactivity disorder (ADHD), 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   Pre-diabetes 12/11/2021   Mild intermittent asthma without complication 12/11/2021   Intractable episodic headache 12/11/2021   Vitamin D  deficiency 12/11/2021   Bipolar 2 disorder (HCC) 12/11/2021   Panic disorder without agoraphobia 12/11/2021   Major depressive disorder, 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   Arnold-Chiari malformation, 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    Past Surgical History:  Procedure Laterality Date   ABDOMINAL HYSTERECTOMY     PERIPHERALLY INSERTED CENTRAL CATHETER INSERTION     during event of Meningitis- then d/c'd to home /w & treated /w antibiotics    SUBOCCIPITAL CRANIECTOMY CERVICAL LAMINECTOMY N/A 07/22/2012   Procedure: SUBOCCIPITAL CRANIECTOMY CERVICAL LAMINECTOMY/DURAPLASTY;  Surgeon: Lamar LELON Peaches, MD;  Location: [facility];  Service: Neurosurgery;  Laterality: N/A;  Suboccipital craniectomy with upper cervial laminectomy with duroplasty    TUBAL LIGATION     VAGINAL DELIVERY     x3  WISDOM TOOTH EXTRACTION      OB History   No obstetric history on file.      Home Medications    Prior to Admission medications  Medication Sig Start Date End Date Taking? Authorizing Provider  albuterol  (VENTOLIN  HFA) 108 (90 Base) MCG/ACT inhaler INHALE 1-2 PUFFS BY MOUTH EVERY 6 HOURS AS NEEDED FOR WHEEZE OR SHORTNESS OF BREATH 07/06/23  Yes Early, Sara E, NP  Cholecalciferol (VITAMIN D3) 1.25 MG (50000 UT) CAPS Take 1 capsule  by mouth once a week. 11/14/23  Yes [provider]  dicyclomine  (BENTYL ) 20 MG tablet Take 1 tablet (20 mg total) by mouth 3 (three) times daily as needed for spasms (Belly cramping.). 05/03/23  Yes Early, Sara E, NP  pantoprazole  (PROTONIX ) 40 MG tablet Take 1 tablet (40 mg total) by mouth daily. 08/07/23  Yes Mansouraty, Aloha Raddle., MD  ammonium lactate  (AMLACTIN) 12 % cream Apply 1 Application topically as needed for dry skin. 09/27/23   Gershon Donnice SAUNDERS, DPM  betamethasone  dipropionate (DIPROLENE ) 0.05 % ointment Apply a thin layer to the skin twice a day for 1 week, then break for 5 days, then restart if symptoms are still there. 03/07/24   Early, Sara E, NP  ketoconazole  (NIZORAL ) 2 % cream Apply 1 Application topically 2 (two) times daily. Until symptoms are gone. 03/07/24   EarlyCamie BRAVO, NP    Family History Family History  Problem Relation Age of Onset   Heart attack Mother    Healthy Father    Colon cancer Neg Hx    Esophageal cancer Neg Hx     Social History Social History[1]   Allergies   Amoxicillin, Imitrex [sumatriptan base], Penicillins, Tramadol, Abilify [aripiprazole], Latuda  [lurasidone ], Norco [hydrocodone -acetaminophen ], Percocet [oxycodone -acetaminophen ], and Soy allergy (obsolete)   Review of Systems Review of Systems Per HPI  Physical Exam Triage Vital Signs ED Triage Vitals  Encounter Vitals Group     BP 04/03/24 0917 (!) 114/59     Girls Systolic BP Percentile --      Girls Diastolic BP Percentile --      Boys Systolic BP Percentile --      Boys Diastolic BP Percentile --      Pulse Rate 04/03/24 0917 (!) 104     Resp 04/03/24 0917 18     Temp 04/03/24 0917 97.9 F (36.6 C)     Temp Source 04/03/24 0917 Oral     SpO2 04/03/24 0917 97 %     Weight 04/03/24 0917 180 lb 14.4 oz (82.1 kg)     Height 04/03/24 0917 5' 4 (1.626 m)     Head Circumference --      Peak Flow --      Pain Score 04/03/24 0915 0     Pain Loc --      Pain  Education --      Exclude from Growth Chart --    No data found.  Updated Vital Signs BP (!) 114/59 (BP Location: Left Arm)   Pulse (!) 104   Temp 97.9 F (36.6 C) (Oral)   Resp 18   Ht 5' 4 (1.626 m)   Wt 180 lb 14.4 oz (82.1 kg)   SpO2 97%   BMI 31.05 kg/m   Visual Acuity Right Eye Distance:   Left Eye Distance:   Bilateral Distance:    Right Eye Near:   Left Eye Near:    Bilateral Near:     Physical Exam Vitals and nursing note  reviewed.  Constitutional:      General: She is not in acute distress.    Appearance: Normal appearance. She is not toxic-appearing.  HENT:     Head: Normocephalic and atraumatic.     Right Ear: External ear normal.     Left Ear: External ear normal.     Mouth/Throat:     Mouth: Mucous membranes are moist.     Pharynx: Oropharynx is clear. Postnasal drip present.  Cardiovascular:     Rate and Rhythm: Regular rhythm. Tachycardia present.  Pulmonary:     Effort: Pulmonary effort is normal. No respiratory distress.     Breath sounds: Normal breath sounds. No wheezing, rhonchi or rales.  Skin:    General: Skin is warm and dry.     Capillary Refill: Capillary refill takes less than 2 seconds.     Coloration: Skin is not jaundiced or pale.     Findings: No erythema or rash.  Neurological:     Mental Status: She is alert and oriented to person, place, and time.  Psychiatric:        Mood and Affect: Mood is anxious.        Behavior: Behavior is cooperative.        Thought Content: Thought content is paranoid. Thought content does not include homicidal or suicidal ideation. Thought content does not include homicidal or suicidal plan.      UC Treatments / Results  Labs (all labs ordered are listed, but only abnormal results are displayed) Labs Reviewed - No data to display  EKG   Radiology No results found.  Procedures Procedures (including critical care time)  Medications Ordered in UC Medications - No data to  display  Initial Impression / Assessment and Plan / UC Course  I have reviewed the triage vital signs and the nursing notes.  Pertinent labs & imaging results that were available during my care of the patient were reviewed by me and considered in my medical decision making (see chart for details).   Patient is a pleasant, nontoxic-appearing 43 year old African-American female presenting today for panic attack.  She is mildly tachycardic, otherwise vital signs are stable.  She appears well overall and examination is reassuring; no SI or HI.  She appears anxious and a little bit paranoid.  I recommended she follow-up with therapist today and sooner at [facility] if symptoms worsen.  The patient was given the opportunity to ask questions.  All questions answered to their satisfaction.  The patient is in agreement to this plan.   Final Clinical Impressions(s) / UC Diagnoses   Final diagnoses:  Panic attack     Discharge Instructions      Please reach out to your Therapist today.  Continue with prior plan of care for panic/anxiety.  If you have another panic attack, or anxiety/depression worsens prior to your appointment with Psychiatry, please seek care at [facility] - contact information has been provided.     ED Prescriptions   None    PDMP not reviewed this encounter.    [1]  Social History Tobacco Use   Smoking status: Never    Passive exposure: Never   Smokeless tobacco: Never  Vaping Use   Vaping status: Never Used  Substance Use Topics   Alcohol use: No   Drug use: No     Chandra Harlene LABOR, NP 04/03/24 1052

## 2024-04-03 NOTE — Discharge Instructions (Addendum)
 Please reach out to your Therapist today.  Continue with prior plan of care for panic/anxiety.  If you have another panic attack, or anxiety/depression worsens prior to your appointment with Psychiatry, please seek care at [facility] care - contact information has been provided.

## 2024-04-03 NOTE — Telephone Encounter (Signed)
 FYI Only or Action Required?: Action required by provider: update on patient condition.  Patient was last seen in primary care on 03/07/2024 by Early, Camie BRAVO, NP.  Called Nurse Triage reporting Panic Attack.  Symptoms began today.  Interventions attempted: Nothing.  Symptoms are: unchanged.Pt. Was driving and had a panic attack, is in [facility] parking lot. Pt. Is going in to be seen. Instructed not to drive, verbalizes understanding.States she is concerned she could have cancer.  Triage Disposition: Go to ED Now (or PCP Triage)  Patient/caregiver understands and will follow disposition?: Yes       Copied from CRM #[id number]. Topic: Clinical - Red Word Triage >> Apr 03, 2024  8:09 AM Darshell M wrote: Red Word that prompted transfer to Nurse Triage: Patient having panic attack and had to pull over after reading something online about cancer. Patient wants to be seen for spot on her face today for concern of cancer. Reason for Disposition  Patient sounds very sick or weak to the triager  Answer Assessment - Initial Assessment Questions 1. CONCERN: Did anything happen that prompted you to call today?      panic 2. ANXIETY SYMPTOMS: Can you describe how you (your loved one; patient) have been feeling? (e.g., tense, restless, panicky, anxious, keyed up, overwhelmed, sense of impending doom).      panic 3. ONSET: How long have you been feeling this way? (e.g., hours, days, weeks)     today 4. SEVERITY: How would you rate the level of anxiety? (e.g., 0 - 10; or mild, moderate, severe).     severe 5. FUNCTIONAL IMPAIRMENT: How have these feelings affected your ability to do daily activities? Have you had more difficulty than usual doing your normal daily activities? (e.g., getting better, same, worse; self-care, school, work, interactions)     yes 6. HISTORY: Have you felt this way before? Have you ever been diagnosed with an anxiety problem in the past? (e.g., generalized  anxiety disorder, panic attacks, PTSD). If Yes, ask: How was this problem treated? (e.g., medicines, counseling, etc.)     yes 7. RISK OF HARM - SUICIDAL IDEATION: Do you ever have thoughts of hurting or killing yourself? If Yes, ask:  Do you have these feelings now? Do you have a plan on how you would do this?     no 8. TREATMENT:  What has been done so far to treat this anxiety? (e.g., medicines, relaxation strategies). What has helped?     no 9. THERAPIST: Do you have a counselor or therapist? If Yes, ask: What is their name?     no 10. POTENTIAL TRIGGERS: Do you drink caffeinated beverages (e.g., coffee, colas, teas), and how much daily? Do you drink alcohol or use any drugs? Have you started any new medicines recently?       no 11. PATIENT SUPPORT: Who is with you now? Who do you live with? Do you have family or friends who you can talk to?        husband 35. OTHER SYMPTOMS: Do you have any other symptoms? (e.g., feeling depressed, trouble concentrating, trouble sleeping, trouble breathing, palpitations or fast heartbeat, chest pain, sweating, nausea, or diarrhea)       Scared of cancer 13. PREGNANCY: Is there any chance you are pregnant? When was your last menstrual period?       no  Protocols used: Anxiety and Panic Attack-A-AH

## 2024-04-11 ENCOUNTER — Encounter (HOSPITAL_COMMUNITY): Payer: Self-pay

## 2024-04-11 ENCOUNTER — Ambulatory Visit (HOSPITAL_COMMUNITY)
Admission: EM | Admit: 2024-04-11 | Discharge: 2024-04-11 | Disposition: A | Discharge status: Home / Self Care | Attending: Physician Assistant | Admitting: Physician Assistant

## 2024-04-11 DIAGNOSIS — F411 Generalized anxiety disorder: Secondary | ICD-10-CM | POA: Diagnosis not present

## 2024-04-11 DIAGNOSIS — L739 Follicular disorder, unspecified: Secondary | ICD-10-CM

## 2024-04-11 MED ORDER — DOXYCYCLINE HYCLATE 100 MG PO CAPS: 100.0000 mg | ORAL_CAPSULE | Freq: Two times a day (BID) | ORAL | 0 refills | Status: DC

## 2024-04-11 NOTE — ED Triage Notes (Signed)
 Patient was seen last month for vaginal discomfort after waxing. Was given an RX cream. States the cream was too strong.   States these last few days the suprapubic area has become irritated again.

## 2024-04-11 NOTE — Discharge Instructions (Signed)
 Start doxycycline  100 mg twice daily for 7 days.  Stay out of the sun while on this medication.  Use hypoallergenic soaps and detergents including Dove sensitive soap.  Follow-up with your primary care if your symptoms persist.  If anything worsens and you have increasing pain, rash, vaginal symptoms you need to be seen immediately.  I do recommend that you follow-up with that behavioral health urgent care for anxiety.  They may be able to provide additional medication to help manage your symptoms.  Follow-up with your cardiologist as scheduled.  If anything worsens and you have chest pain, shortness of breath, heart racing, lightheadedness, passing out you need to go to the ER.

## 2024-04-11 NOTE — ED Provider Notes (Signed)
 " [facility]    CSN: [id number] Arrival date & time: 04/11/24  1727      History   Chief Complaint Chief Complaint  Patient presents with   Groin Pain    HPI [name] is a 43 y.o. female.   Patient presents today with a prolonged history of vaginal irritation after waxing.  She reports that her symptoms began after she waxed herself in September 2025.  She immediately developed significant folliculitis and concern for chemical burns.  She was seen by her primary care who initially prescribed betamethasone  and ketoconazole  topical medication but reports that this caused worsening symptoms and so she discontinued them quickly.  She has not attempted boxing or other grooming since then but feels as though she might be developing recurrent folliculitis as she has noticed small bumps at the base of the hair follicle with associated discomfort.  She denies history of recurrent skin infections or MRSA.  Denies any fever, nausea, vomiting.  Denies any pelvic pain, abdominal pain, vaginal discharge.  She has been applying Neosporin and did recently change her soap to CeraVe when she typically uses Dove.  She was noted to be tachycardic.  She reports significant anxiety and is not currently being treated for this as in the past when she has been given medication this suicidal ideation.  She was seen by cardiology earlier today and reports that she had a normal workup including EKG.  She has had blood work including TSH and iron studies as well as electrolytes within the past few months that have all been normal.  She denies any chest pain, shortness of breath, headache, vision change, dizziness, near syncope.    Past Medical History:  Diagnosis Date   Acute vaginitis 03/31/2022   Anemia    h/o   Anginal pain    pt. takes nitrostat - as needed, hasn't had since 04/2012, sees Dr. Levern, last ekg was /w PCP- [facility]   Anxiety    Asthma    Bloating 08/07/2023   Borderline  personality disorder (HCC)    Chiari malformation    Depression    E coli bacteremia 01/22/2012   GERD (gastroesophageal reflux disease)    Headache(784.0)    Headache(784.0) 01/19/2012   Major depressive disorder, recurrent episode, mild 04/20/2018   Meningitis    Oct. 2013   Nausea without vomiting 08/07/2023   Neuromuscular disorder (HCC)    muscle spasms-back & arms    Other fatigue 02/21/2022   Palpitations 12/17/2023   Papanicolaou smear for cervical cancer screening 02/19/2023   Paresthesias 12/17/2023   Sensation of feeling cold 12/17/2023   SVT (supraventricular tachycardia) 01/22/2012   Tachycardia 12/17/2023   UTI (lower urinary tract infection) 01/20/2012   Vision changes 12/11/2021    Patient Active Problem List   Diagnosis Date Noted   Borderline personality disorder (HCC) 03/11/2024   Perimenopausal symptoms 05/09/2023   Encounter for annual physical exam 02/19/2023   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/19/2023   Gastroesophageal reflux disease 03/31/2022   Attention deficit hyperactivity disorder (ADHD), 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   Pre-diabetes 12/11/2021   Mild intermittent asthma without complication 12/11/2021   Intractable episodic headache 12/11/2021   Vitamin D  deficiency 12/11/2021   Bipolar 2 disorder (HCC) 12/11/2021   Panic disorder without agoraphobia 12/11/2021   Major depressive disorder, 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   Arnold-Chiari malformation, 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    Past Surgical History:  Procedure Laterality Date   ABDOMINAL HYSTERECTOMY  PERIPHERALLY INSERTED CENTRAL CATHETER INSERTION     during event of Meningitis- then d/c'd to home /w & treated /w antibiotics    SUBOCCIPITAL CRANIECTOMY CERVICAL LAMINECTOMY N/A 07/22/2012   Procedure: SUBOCCIPITAL CRANIECTOMY CERVICAL LAMINECTOMY/DURAPLASTY;  Surgeon: Lamar LELON Peaches, MD;  Location: [facility];  Service: Neurosurgery;   Laterality: N/A;  Suboccipital craniectomy with upper cervial laminectomy with duroplasty    TUBAL LIGATION     VAGINAL DELIVERY     x3   WISDOM TOOTH EXTRACTION      OB History   No obstetric history on file.      Home Medications    Prior to Admission medications  Medication Sig Start Date End Date Taking? Authorizing Provider  doxycycline  (VIBRAMYCIN ) 100 MG capsule Take 1 capsule (100 mg total) by mouth 2 (two) times daily. 04/11/24  Yes Havish Petties K, PA-C  albuterol  (VENTOLIN  HFA) 108 (90 Base) MCG/ACT inhaler INHALE 1-2 PUFFS BY MOUTH EVERY 6 HOURS AS NEEDED FOR WHEEZE OR SHORTNESS OF BREATH 07/06/23   Early, Sara E, NP  ammonium lactate  (AMLACTIN) 12 % cream Apply 1 Application topically as needed for dry skin. 09/27/23   Gershon Donnice SAUNDERS, DPM  betamethasone  dipropionate (DIPROLENE ) 0.05 % ointment Apply a thin layer to the skin twice a day for 1 week, then break for 5 days, then restart if symptoms are still there. 03/07/24   Early, Sara E, NP  Cholecalciferol (VITAMIN D3) 1.25 MG (50000 UT) CAPS Take 1 capsule by mouth once a week. 11/14/23   [provider]  dicyclomine  (BENTYL ) 20 MG tablet Take 1 tablet (20 mg total) by mouth 3 (three) times daily as needed for spasms (Belly cramping.). 05/03/23   Early, Sara E, NP  ketoconazole  (NIZORAL ) 2 % cream Apply 1 Application topically 2 (two) times daily. Until symptoms are gone. 03/07/24   Early, Sara E, NP  pantoprazole  (PROTONIX ) 40 MG tablet Take 1 tablet (40 mg total) by mouth daily. 08/07/23   Mansouraty, Aloha Raddle., MD    Family History Family History  Problem Relation Age of Onset   Heart attack Mother    Healthy Father    Colon cancer Neg Hx    Esophageal cancer Neg Hx     Social History Social History[1]   Allergies   Amoxicillin, Imitrex [sumatriptan base], Penicillins, Tramadol, Abilify [aripiprazole], Latuda  [lurasidone ], Norco [hydrocodone -acetaminophen ], Percocet [oxycodone -acetaminophen ], and Soy  allergy (obsolete)   Review of Systems Review of Systems  Constitutional:  Negative for activity change, appetite change, fatigue and fever.  Respiratory:  Negative for shortness of breath.   Cardiovascular:  Negative for chest pain, palpitations and leg swelling.  Genitourinary:  Positive for genital sores. Negative for dysuria, frequency, pelvic pain, urgency, vaginal bleeding, vaginal discharge and vaginal pain.  Musculoskeletal:  Negative for arthralgias and myalgias.  Skin:  Positive for rash.  Neurological:  Negative for dizziness, light-headedness and headaches.     Physical Exam Triage Vital Signs ED Triage Vitals [04/11/24 1913]  Encounter Vitals Group     BP 122/85     Girls Systolic BP Percentile      Girls Diastolic BP Percentile      Boys Systolic BP Percentile      Boys Diastolic BP Percentile      Pulse Rate (!) 118     Resp 16     Temp 98.7 F (37.1 C)     Temp Source Oral     SpO2 97 %     Weight 180  lb 12.4 oz (82 kg)     Height 5' 4 (1.626 m)     Head Circumference      Peak Flow      Pain Score 6     Pain Loc      Pain Education      Exclude from Growth Chart    No data found.  Updated Vital Signs BP 122/85 (BP Location: Left Arm)   Pulse (!) 118   Temp 98.7 F (37.1 C) (Oral)   Resp 16   Ht 5' 4 (1.626 m)   Wt 180 lb 12.4 oz (82 kg)   SpO2 97%   BMI 31.03 kg/m   Visual Acuity Right Eye Distance:   Left Eye Distance:   Bilateral Distance:    Right Eye Near:   Left Eye Near:    Bilateral Near:     Physical Exam Vitals reviewed.  Constitutional:      General: She is awake. She is not in acute distress.    Appearance: Normal appearance. She is well-developed. She is not ill-appearing.     Comments: Very pleasant female appears stated age in no acute distress sitting comfortably in exam room  HENT:     Head: Normocephalic and atraumatic.  Cardiovascular:     Rate and Rhythm: Regular rhythm. Tachycardia present.     Heart  sounds: Normal heart sounds, S1 normal and S2 normal. No murmur heard. Pulmonary:     Effort: Pulmonary effort is normal.     Breath sounds: Normal breath sounds. No wheezing, rhonchi or rales.     Comments: Clear to auscultation bilaterally Abdominal:     Palpations: Abdomen is soft.     Tenderness: There is no abdominal tenderness.  Genitourinary:    Labia:        Right: Rash present.        Left: Rash present.      Comments: Multiple fine pustules over her mons pubis and along superior portion of labia majora bilaterally.  No ulcerations or additional lesions noted. Psychiatric:        Behavior: Behavior is cooperative.      UC Treatments / Results  Labs (all labs ordered are listed, but only abnormal results are displayed) Labs Reviewed - No data to display  EKG   Radiology No results found.  Procedures Procedures (including critical care time)  Medications Ordered in UC Medications - No data to display  Initial Impression / Assessment and Plan / UC Course  I have reviewed the triage vital signs and the nursing notes.  Pertinent labs & imaging results that were available during my care of the patient were reviewed by me and considered in my medical decision making (see chart for details).     Patient is well-appearing, afebrile, nontoxic.  She does have a few scattered pustules and I am unsure if this is the beginning of a folliculitis versus typical hair growth but given she has had increasing discomfort we will cover with doxycycline  100 mg twice daily for 7 days.  Recommended hypoallergenic soaps and detergents and to avoid grooming until her symptoms have completely resolved.  We discussed that if her symptoms are worsening or changing and she has a rash, additional lesions, fever, increasing pain she should return for reevaluation.  She was noted to be tachycardic and reported that she was feeling anxious.  She has had normal blood work recently but we did obtain  an EKG that showed normal sinus rhythm with  PVCs but without ischemic changes and no significant change other than occasional PVC compared to her last tracing on 02/11/2024.  She has established cardiology and was encouraged to follow-up with them as scheduled.  We discussed that if she develops any symptoms including chest pain, palpitations, lightheadedness, near-syncope she needs to be seen immediately.  It is possible that her symptoms are related to anxiety and encouraged her to follow-up with behavioral urgent care for additional evaluation.  Final Clinical Impressions(s) / UC Diagnoses   Final diagnoses:  Folliculitis  Anxiety state     Discharge Instructions      Start doxycycline  100 mg twice daily for 7 days.  Stay out of the sun while on this medication.  Use hypoallergenic soaps and detergents including Dove sensitive soap.  Follow-up with your primary care if your symptoms persist.  If anything worsens and you have increasing pain, rash, vaginal symptoms you need to be seen immediately.  I do recommend that you follow-up with that behavioral health urgent care for anxiety.  They may be able to provide additional medication to help manage your symptoms.  Follow-up with your cardiologist as scheduled.  If anything worsens and you have chest pain, shortness of breath, heart racing, lightheadedness, passing out you need to go to the ER.     ED Prescriptions     Medication Sig Dispense Auth. Provider   doxycycline  (VIBRAMYCIN ) 100 MG capsule Take 1 capsule (100 mg total) by mouth 2 (two) times daily. 14 capsule Tokiko Diefenderfer K, PA-C      PDMP not reviewed this encounter.    [1]  Social History Tobacco Use   Smoking status: Never    Passive exposure: Never   Smokeless tobacco: Never  Vaping Use   Vaping status: Never Used  Substance Use Topics   Alcohol use: No   Drug use: No     Sherrell Rocky POUR, PA-C 04/11/24 2043  "

## 2024-04-14 ENCOUNTER — Ambulatory Visit (HOSPITAL_COMMUNITY): Admitting: Psychiatry

## 2024-04-14 ENCOUNTER — Encounter (HOSPITAL_COMMUNITY): Payer: Self-pay | Admitting: Psychiatry

## 2024-04-14 DIAGNOSIS — F3181 Bipolar II disorder: Secondary | ICD-10-CM

## 2024-04-14 DIAGNOSIS — F410 Panic disorder [episodic paroxysmal anxiety] without agoraphobia: Secondary | ICD-10-CM | POA: Diagnosis not present

## 2024-04-14 MED ORDER — GABAPENTIN 100 MG PO CAPS: 100.0000 mg | ORAL_CAPSULE | Freq: Three times a day (TID) | ORAL | 3 refills | Status: DC
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
## 2024-04-21 ENCOUNTER — Telehealth (HOSPITAL_COMMUNITY): Payer: Self-pay

## 2024-04-21 ENCOUNTER — Other Ambulatory Visit (HOSPITAL_COMMUNITY): Payer: Self-pay | Admitting: Psychiatry

## 2024-04-21 DIAGNOSIS — F3181 Bipolar II disorder: Secondary | ICD-10-CM

## 2024-04-21 DIAGNOSIS — F410 Panic disorder [episodic paroxysmal anxiety] without agoraphobia: Secondary | ICD-10-CM

## 2024-04-21 MED ORDER — GABAPENTIN 100 MG PO CAPS: 100.0000 mg | ORAL_CAPSULE | Freq: Three times a day (TID) | ORAL | 3 refills | Status: DC

## 2024-04-21 NOTE — Telephone Encounter (Signed)
 gabapentin  (NEURONTIN ) 100 MG capsule 100 mg, 3 times daily         Summary:Take 1 capsule (100 mg total) by mouth 3 (three) times daily., Starting Mon 04/14/2024, Normal Dose, Route, Frequency:100 mg, Oral, 3 times dailyStart:12/22/2025Ordered On:12/22/2025Pharmacy:[id number] - [place], [place] - [address] Associated:Taking:Long-term:Med Note:       Change Directions:Take 1 capsule (100 mg total) by mouth 3 (three) times daily. Ordering Department:[facility] Authorized Ab:[email or web address], Brittney E, NP Dispense:90 capsule Refills:3 ordered

## 2024-04-21 NOTE — Telephone Encounter (Signed)
 Medication sent to preferred pharmacy

## 2024-04-22 ENCOUNTER — Other Ambulatory Visit (HOSPITAL_COMMUNITY): Payer: Self-pay | Admitting: Psychiatry

## 2024-04-22 MED ORDER — LAMOTRIGINE 25 MG PO TABS: 25.0000 mg | ORAL_TABLET | Freq: Every day | ORAL | 0 refills | Status: DC

## 2024-04-22 NOTE — Telephone Encounter (Signed)
 Patient reports that gabapentin  makes her dizzy.  She reports that she discontinued it.  Patient reports that she continues to have mood instability.  She does not like antipsychotics or antidepressants.  Today patient agreeable to starting Lamictal 25 mg for 2 weeks.  She will then increase to 50 mg for another 2 weeks and taper to 75 mg.  Potential side effects of medication and risks vs benefits of treatment vs non-treatment were explained and discussed. All questions were answered.

## 2024-04-24 ENCOUNTER — Other Ambulatory Visit: Payer: Self-pay | Admitting: Nurse Practitioner

## 2024-04-24 ENCOUNTER — Other Ambulatory Visit (HOSPITAL_BASED_OUTPATIENT_CLINIC_OR_DEPARTMENT_OTHER): Payer: Self-pay | Admitting: Family Medicine

## 2024-04-24 DIAGNOSIS — G44219 Episodic tension-type headache, not intractable: Secondary | ICD-10-CM

## 2024-04-24 DIAGNOSIS — R519 Headache, unspecified: Secondary | ICD-10-CM

## 2024-04-25 ENCOUNTER — Encounter (HOSPITAL_COMMUNITY): Payer: Self-pay

## 2024-04-25 ENCOUNTER — Ambulatory Visit (HOSPITAL_COMMUNITY)
Admission: EM | Admit: 2024-04-25 | Discharge: 2024-04-25 | Disposition: A | Discharge status: Home / Self Care | Attending: Nurse Practitioner | Admitting: Nurse Practitioner

## 2024-04-25 DIAGNOSIS — H8110 Benign paroxysmal vertigo, unspecified ear: Secondary | ICD-10-CM

## 2024-04-25 DIAGNOSIS — L739 Follicular disorder, unspecified: Secondary | ICD-10-CM

## 2024-04-25 MED ORDER — BENZOYL PEROXIDE WASH 5 % EX LIQD: CUTANEOUS | 0 refills | Status: DC

## 2024-04-25 MED ORDER — MECLIZINE HCL 12.5 MG PO TABS: 12.5000 mg | ORAL_TABLET | Freq: Three times a day (TID) | ORAL | 0 refills | Status: DC | PRN

## 2024-04-25 MED ORDER — CLINDAMYCIN PHOS (TWICE-DAILY) 1 % EX GEL: CUTANEOUS | 0 refills | Status: DC

## 2024-04-25 NOTE — Discharge Instructions (Addendum)
 Your external vaginal discomfort appears related to skin and hair follicle irritation following waxing, rather than an infection. You may continue to notice some tenderness in small areas as hair grows back. Clean the area gently twice a day using the benzoyl peroxide wash as prescribed, then apply the clindamycin  gel to the affected external skin only. Avoid waxing or shaving, as this can worsen irritation; trimming the hair is the safest option while healing occurs. Wear loose-fitting underwear, avoid fragranced soaps or products in the area, and keep the skin clean and dry. Mild improvement may occur gradually as inflammation settles and hair regrowth completes. Follow up with your primary care provider or a gynecologist if symptoms do not continue to improve, if new bumps develop, or if discomfort persists beyond treatment.  Your dizziness is likely related to gabapentin , which has already been stopped. It is reassuring that symptoms are improving. You may take meclizine as needed for dizziness, especially if symptoms are triggered by head movements. Move slowly when changing positions, avoid sudden bending or looking up or down, and use support when standing if you feel unsteady. Continue monitoring your symptoms, as medication-related vertigo often resolves gradually over time.  Seek emergency care right away if you develop severe or worsening dizziness, fainting, chest pain, new or worsening headache, weakness on one side of the body, vision or speech changes, confusion, inability to walk safely, or signs of infection such as fever, rapidly increasing pain, spreading redness, swelling, or drainage from the affected skin.

## 2024-04-25 NOTE — ED Triage Notes (Signed)
 Patient presents with vaginal discomfort after waxing. Request cream for vaginal area.  Patient states she has vertigo, stop taking her gabapentin  several days ago.

## 2024-04-25 NOTE — Telephone Encounter (Signed)
 I do not see this medication in her current med list.

## 2024-04-25 NOTE — ED Provider Notes (Signed)
 " [facility]    CSN: [id number] Arrival date & time: 04/25/24  0801      History   Chief Complaint No chief complaint on file.   HPI [name] is a 44 y.o. female.   Discussed the use of AI scribe software for clinical note transcription with the patient, who gave verbal consent to proceed.   The patient presents with ongoing external vaginal irritation that began after waxing in September. She was previously evaluated on December 19th and treated with doxycycline  twice daily for seven days. She reports that her primary care provider also prescribed topical creams, which she discontinued because they were excessively strong and worsened her symptoms. The discomfort is described as localized external tenderness to touch, primarily involving the superior aspect of the vulvar area. She states the irritation was initially severe but has gradually improved over time. She denies internal vaginal itching, irritation, discharge, odor, or other internal vaginal symptoms. Hair regrowth is occurring in the previously waxed area, with some regions improving while other small localized areas remain uncomfortable.  The patient also reports intermittent vertigo that has been improving since discontinuing gabapentin  four days ago. Gabapentin  had been prescribed at a dose of three times daily for anxiety, but she experienced significant dizziness shortly after starting the medication. After discussing the symptoms with a pharmacist, who advised reducing the dosing frequency to once daily, the patient elected to stop the medication completely after approximately three days due to concern for side effects. She describes the dizziness as intermittent and unpredictable, often triggered by head movements such as looking up or down, with greater severity in the days immediately after discontinuation and gradual improvement since.  Additionally, the patient notes an intermittent sensation of  something being stuck in her throat, which she attributes to possible postnasal drainage. She denies associated headache, facial, hand, or foot numbness or tingling, cough, shortness of breath, or fever. She reports recent exposure to multiple medications within a short period of time and expresses concern regarding medication-related side effects.  The following sections of the patient's history were reviewed and updated as appropriate: allergies, current medications, past family history, past medical history, past social history, past surgical history, and problem list.       Past Medical History:  Diagnosis Date   Acute vaginitis 03/31/2022   Anemia    h/o   Anginal pain    pt. takes nitrostat - as needed, hasn't had since 04/2012, sees Dr. 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 2013   Nausea without vomiting 08/07/2023   Neuromuscular disorder (HCC)    muscle spasms-back & arms    Other fatigue 02/21/2022   Palpitations 12/17/2023   Papanicolaou smear for cervical cancer screening 02/19/2023   Paresthesias 12/17/2023   Sensation of feeling cold 12/17/2023   SVT (supraventricular tachycardia) 01/22/2012   Tachycardia 12/17/2023   UTI (lower urinary tract infection) 01/20/2012   Vision changes 12/11/2021    Patient Active Problem List   Diagnosis Date Noted   Borderline personality disorder (HCC) 03/11/2024   Perimenopausal symptoms 05/09/2023   Encounter for annual physical exam 02/19/2023  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/19/2023   Gastroesophageal reflux disease 03/31/2022   Attention deficit hyperactivity disorder (ADHD), predominantly  inattentive type 12/11/2021   Pre-diabetes 12/11/2021   Mild intermittent asthma without complication 12/11/2021   Intractable episodic headache 12/11/2021   Vitamin D  deficiency 12/11/2021   Bipolar 2 disorder (HCC) 12/11/2021   Panic disorder without agoraphobia 12/11/2021   Major depressive disorder, 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   Arnold-Chiari malformation, 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    Past Surgical History:  Procedure Laterality Date   ABDOMINAL HYSTERECTOMY     PERIPHERALLY INSERTED CENTRAL CATHETER INSERTION     during event of Meningitis- then d/c'd to home /w & treated /w antibiotics    SUBOCCIPITAL CRANIECTOMY CERVICAL LAMINECTOMY N/A 07/22/2012   Procedure: SUBOCCIPITAL CRANIECTOMY CERVICAL LAMINECTOMY/DURAPLASTY;  Surgeon: Lamar LELON Peaches, MD;  Location: [facility];  Service: Neurosurgery;  Laterality: N/A;  Suboccipital craniectomy with upper cervial laminectomy with duroplasty    TUBAL LIGATION     VAGINAL DELIVERY     x3   WISDOM TOOTH EXTRACTION      OB History   No obstetric history on file.      Home Medications    Prior to Admission medications  Medication Sig Start Date End Date Taking? Authorizing Provider  benzoyl peroxide 5 % external liquid Cleanse affected area twice a day, pat dry and apply prescribed clindamycin  gel 04/25/24  Yes Iola Lukes, FNP  clindamycin  (CLINDAGEL) 1 % gel Apply thin layer to affected areas two times a day after cleansing with benzoyl peroxide 04/25/24  Yes Jameca Chumley, FNP  meclizine (ANTIVERT) 12.5 MG tablet Take 1 tablet (12.5 mg total) by mouth 3 (three) times daily as needed for dizziness (vertigo). 04/25/24  Yes Iola Lukes, FNP  albuterol  (VENTOLIN  HFA) 108 (90 Base) MCG/ACT inhaler INHALE 1-2 PUFFS BY MOUTH EVERY 6 HOURS AS NEEDED FOR WHEEZE OR SHORTNESS OF BREATH 07/06/23   Early, Sara E, NP  ammonium lactate  (AMLACTIN) 12 % cream Apply 1 Application topically as needed for dry  skin. 09/27/23   Gershon Donnice SAUNDERS, DPM  Cholecalciferol (VITAMIN D3) 1.25 MG (50000 UT) CAPS Take 1 capsule by mouth once a week. 11/14/23   [provider]  dicyclomine  (BENTYL ) 20 MG tablet Take 1 tablet (20 mg total) by mouth 3 (three) times daily as needed for spasms (Belly cramping.). 05/03/23   Early, Sara E, NP  pantoprazole  (PROTONIX ) 40 MG tablet Take 1 tablet (40 mg total) by mouth daily. 08/07/23   Mansouraty, Aloha Raddle., MD    Family History Family History  Problem Relation Age of Onset   Heart attack Mother    Healthy Father    Colon cancer Neg Hx    Esophageal cancer Neg Hx     Social History Social History[1]   Allergies   Amoxicillin, Imitrex [sumatriptan base], Penicillins, Tramadol, Abilify [aripiprazole], Latuda  [lurasidone ], Norco [hydrocodone -acetaminophen ], Percocet [oxycodone -acetaminophen ], and Soy allergy (obsolete)   Review of Systems Review of Systems  Constitutional:  Positive for fever.  HENT:  Negative for postnasal drip and sore throat.   Eyes:  Negative for visual disturbance.  Respiratory:  Negative for cough and shortness of breath.   Cardiovascular:  Negative for chest pain and palpitations.  Gastrointestinal:  Negative for nausea and vomiting.  Genitourinary:  Negative for dysuria and vaginal discharge.  Skin:  Negative for rash.       Itchy skin   Neurological:  Positive for dizziness. Negative for weakness, numbness and headaches.  Psychiatric/Behavioral:  The patient is nervous/anxious (increased anxiety; followed by behavioral health).   All other systems reviewed and are negative.    Physical Exam Triage Vital Signs ED Triage Vitals  Encounter Vitals Group     BP 04/25/24 0818 126/84     Girls Systolic BP Percentile --      Girls Diastolic BP Percentile --      Boys Systolic BP Percentile --      Boys Diastolic BP Percentile --      Pulse Rate 04/25/24 0818 92     Resp 04/25/24 0818 18     Temp 04/25/24 0821 98.4 F  (36.9 C)     Temp src --      SpO2 04/25/24 0818 96 %     Weight --      Height --      Head Circumference --      Peak Flow --      Pain Score 04/25/24 0824 0     Pain Loc --      Pain Education --      Exclude from Growth Chart --    Orthostatic VS for the past 24 hrs:  BP- Lying Pulse- Lying BP- Sitting Pulse- Sitting BP- Standing at 0 minutes Pulse- Standing at 0 minutes  04/25/24 0830 113/76 96 126/84 91 (!) 122/91 92    Updated Vital Signs BP 126/84 (BP Location: Left Arm)   Pulse 92   Temp 98.4 F (36.9 C)   Resp 18   SpO2 96%   Visual Acuity Right Eye Distance:   Left Eye Distance:   Bilateral Distance:    Right Eye Near:   Left Eye Near:    Bilateral Near:     Physical Exam Vitals reviewed. Exam conducted with a chaperone present Evelyn Angus, RN).  Constitutional:      General: She is awake. She is not in acute distress.    Appearance: Normal appearance. She is well-developed. She is not ill-appearing, toxic-appearing or diaphoretic.  HENT:     Head: Normocephalic.     Right Ear: Hearing normal.     Left Ear: Hearing normal.     Nose: Nose normal.     Mouth/Throat:     Mouth: Mucous membranes are moist.  Eyes:     General: Vision grossly intact. No visual field deficit.    Extraocular Movements: Extraocular movements intact.     Right eye: Normal extraocular motion and no nystagmus.     Left eye: Normal extraocular motion and no nystagmus.     Conjunctiva/sclera: Conjunctivae normal.     Pupils: Pupils are equal, round, and reactive to light.  Cardiovascular:     Rate and Rhythm: Normal rate and regular rhythm.     Heart sounds: Normal heart sounds.  Pulmonary:     Effort: Pulmonary effort is normal.     Breath sounds: Normal breath sounds and air entry.  Musculoskeletal:        General: Normal range of motion.     Cervical back: Normal range of motion and neck supple.  Skin:    General: Skin is warm and dry.     Comments: The mons pubis and  external vaginal region reveals coarse hair regrowth. Two small, well-circumscribed, firm lesions are palpated within the right labia majora with mild localized tenderness. There is no associated erythema, edema, fluctuance, drainage, or warmth present.   Neurological:     General: No focal deficit present.  Mental Status: She is alert and oriented to person, place, and time.     Cranial Nerves: No cranial nerve deficit, dysarthria or facial asymmetry.     Sensory: Sensation is intact. No sensory deficit.     Motor: Motor function is intact.     Coordination: Coordination is intact.     Gait: Gait is intact.  Psychiatric:        Speech: Speech normal.        Behavior: Behavior normal. Behavior is cooperative.      UC Treatments / Results  Labs (all labs ordered are listed, but only abnormal results are displayed) Labs Reviewed - No data to display  EKG   Radiology No results found.  Procedures Procedures (including critical care time)  Medications Ordered in UC Medications - No data to display  Initial Impression / Assessment and Plan / UC Course  I have reviewed the triage vital signs and the nursing notes.  Pertinent labs & imaging results that were available during my care of the patient were reviewed by me and considered in my medical decision making (see chart for details).     The patient presents with two primary concerns. First, she reports persistent external vaginal irritation following waxing performed in September. Current symptoms are limited to localized external tenderness, most notable in the upper vulvar area, without internal vaginal itching, discharge, odor, or dyspareunia. Examination reveals hair regrowth in the mons pubis and external vaginal region with a few small, palpable bumps, without erythema, warmth, drainage, fluctuance, or swelling, and no evidence of acute bacterial infection. Clinical presentation is most consistent with follicular  irritation and post-waxing folliculitis. Treatment includes benzoyl peroxide 5% wash twice daily followed by topical clindamycin  1% gel twice daily. The patient was advised to avoid shaving or waxing and to trim hair only to reduce further irritation. She was instructed to follow up with her primary care provider or gynecology if symptoms do not improve or worsen, and to seek urgent evaluation for signs of infection such as increasing pain, redness, swelling, drainage, or fever.  Secondly, the patient reports vertigo that began after initiation of gabapentin  taken three times daily for approximately three days. She discontinued the medication four days ago due to severe dizziness. Symptoms have been improving since discontinuation but continue to occur intermittently, particularly with positional changes involving head movement. This presentation is consistent with medication-induced vertigo with gradual resolution following cessation. Meclizine 12.5 mg was prescribed to be taken as needed for dizziness. The patient was counseled on slow position changes and fall precautions. She was advised to follow up with her primary care provider if symptoms persist beyond continued medication washout or worsen, and to seek emergency care for severe or persistent vertigo, syncope, focal neurologic deficits, chest pain, or new onset headache.  Today's evaluation has revealed no signs of a dangerous process. Discussed diagnosis with patient and/or guardian. Patient and/or guardian aware of their diagnosis, possible red flag symptoms to watch out for and need for close follow up. Patient and/or guardian understands verbal and written discharge instructions. Patient and/or guardian comfortable with plan and disposition.  Patient and/or guardian has a clear mental status at this time, good insight into illness (after discussion and teaching) and has clear judgment to make decisions regarding their care  Documentation was  completed with the aid of voice recognition software. Transcription may contain typographical errors.  Final Clinical Impressions(s) / UC Diagnoses   Final diagnoses:  Chronic folliculitis  Benign paroxysmal positional  vertigo, unspecified laterality     Discharge Instructions      Your external vaginal discomfort appears related to skin and hair follicle irritation following waxing, rather than an infection. You may continue to notice some tenderness in small areas as hair grows back. Clean the area gently twice a day using the benzoyl peroxide wash as prescribed, then apply the clindamycin  gel to the affected external skin only. Avoid waxing or shaving, as this can worsen irritation; trimming the hair is the safest option while healing occurs. Wear loose-fitting underwear, avoid fragranced soaps or products in the area, and keep the skin clean and dry. Mild improvement may occur gradually as inflammation settles and hair regrowth completes. Follow up with your primary care provider or a gynecologist if symptoms do not continue to improve, if new bumps develop, or if discomfort persists beyond treatment.  Your dizziness is likely related to gabapentin , which has already been stopped. It is reassuring that symptoms are improving. You may take meclizine as needed for dizziness, especially if symptoms are triggered by head movements. Move slowly when changing positions, avoid sudden bending or looking up or down, and use support when standing if you feel unsteady. Continue monitoring your symptoms, as medication-related vertigo often resolves gradually over time.  Seek emergency care right away if you develop severe or worsening dizziness, fainting, chest pain, new or worsening headache, weakness on one side of the body, vision or speech changes, confusion, inability to walk safely, or signs of infection such as fever, rapidly increasing pain, spreading redness, swelling, or drainage from the affected  skin.      ED Prescriptions     Medication Sig Dispense Auth. Provider   benzoyl peroxide 5 % external liquid Cleanse affected area twice a day, pat dry and apply prescribed clindamycin  gel 148 g Aluel Schwarz, FNP   clindamycin  (CLINDAGEL) 1 % gel Apply thin layer to affected areas two times a day after cleansing with benzoyl peroxide 30 g Kourosh Jablonsky, FNP   meclizine (ANTIVERT) 12.5 MG tablet Take 1 tablet (12.5 mg total) by mouth 3 (three) times daily as needed for dizziness (vertigo). 12 tablet Iola Lukes, FNP      PDMP not reviewed this encounter.      [1]  Social History Tobacco Use   Smoking status: Never    Passive exposure: Never   Smokeless tobacco: Never  Vaping Use   Vaping status: Never Used  Substance Use Topics   Alcohol use: No   Drug use: No     Iola Lukes, FNP 04/25/24 1034  "

## 2024-04-29 ENCOUNTER — Ambulatory Visit: Payer: Self-pay | Admitting: Internal Medicine

## 2024-05-01 ENCOUNTER — Telehealth: Payer: Self-pay

## 2024-05-01 NOTE — Telephone Encounter (Signed)
 I called pt. Back and she had questions about the medicine that was called by UC for her feet. She said it was burning, I did let her know that the medicine has peroxide in it will burn. She asked about getting her cyclobenzaprine  refilled again and I let her know I had already sent a message back to you about that and that we are seeing pts. So you have reviewed that yet. Pt. Stated she still take cyclobenzaprine  but she stopped taking Gabapentin  and Lyrica.    Copied from CRM #[id number]. Topic: Clinical - Medical Advice >> May 01, 2024 10:25 AM [name] wrote: Reason for CRM: Patient called in would like for the nurse to give her a callback  [phone]

## 2024-05-02 ENCOUNTER — Ambulatory Visit
Admission: RE | Admit: 2024-05-02 | Discharge: 2024-05-02 | Disposition: A | Source: Ambulatory Visit | Attending: Nurse Practitioner

## 2024-05-02 DIAGNOSIS — Z1231 Encounter for screening mammogram for malignant neoplasm of breast: Secondary | ICD-10-CM

## 2024-05-03 ENCOUNTER — Ambulatory Visit (HOSPITAL_COMMUNITY)
Admission: EM | Admit: 2024-05-03 | Discharge: 2024-05-03 | Disposition: A | Discharge status: Home / Self Care | Attending: Family Medicine | Admitting: Family Medicine

## 2024-05-03 DIAGNOSIS — F411 Generalized anxiety disorder: Secondary | ICD-10-CM

## 2024-05-03 MED ORDER — HYDROXYZINE PAMOATE 25 MG PO CAPS: 25.0000 mg | ORAL_CAPSULE | Freq: Two times a day (BID) | ORAL | 0 refills | Status: DC | PRN
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
## 2024-05-03 NOTE — Discharge Instructions (Addendum)
 Discharge Recommendations:   Medications: Patient is to take medications as prescribed. A prescription for hydroxyzine  has been sent to [facility]. Take as directed. The patient is to contact a medical professional and/or outpatient provider to address any new side effects that develop. The patient should update outpatient providers of any new medications and/or medication changes.   Outpatient Follow up: Please review list of outpatient resources for psychiatry and counseling. Please follow up with your primary care provider for all medical related needs.   Therapy: We recommend that patient participate in individual therapy to address mental health concerns.  Safety:   The following safety precautions should be taken:   No sharp objects. This includes scissors, razors, scrapers, and putty knives.   Chemicals should be removed and locked up.   Medications should be removed and locked up.   Weapons should be removed and locked up. This includes firearms, knives and instruments that can be used to cause injury.   The patient should abstain from use of illicit substances/drugs and abuse of any medications.  If symptoms worsen or do not continue to improve or if the patient becomes actively suicidal or homicidal then it is recommended that the patient return to the closest hospital emergency department, the [facility], or call 911 for further evaluation and treatment.  National Suicide Prevention Lifeline 1-800-SUICIDE or (734)488-5336.  About 988 988 offers 24/7 access to trained crisis counselors who can help people experiencing mental health-related distress. People can call or text 988 or chat 988lifeline.org for themselves or if they are worried about a loved one who may need crisis support.

## 2024-05-03 NOTE — ED Provider Notes (Signed)
 Behavioral Health Urgent Care Medical Screening Exam  Patient Name: [name] MRN: [id number] Date of Evaluation: 05/03/2024 Chief Complaint: Back to back panic attacks  Diagnosis:  Final diagnoses:  Anxiety state   Chart reviewed with attending psychiatrist, Dr Garvin Gaines.   History of Present illness: Per triage, [name] is a 44 y.o. female who presents voluntarily to [facility] due to increased anxiety.. Patient reports having passive suicidal thoughts a few days ago but denies any currently. Patient denies any HI or AVH. Patient reports diagnosis of borderline personality, bipolar disorder, ADHD, and major depressive disorder, along with panic disorder. Patient is not currently on medication. She does engage in consistent therapy. Patient reports increase in her anxiety for the past couple of weeks. She reports that she had a portion of her body waxed a few weeks back and it is slow in healing of problems with the follicles that have been irritated. Due to the fact that she has had sepsis in the past patient is afraid that she may end up with sepsis again and that has her very anxious. Patient denies any alcohol or substance use.[name]   Pt is seen face-to-face on the [facility] Adult treatment area. On approach, she is sitting in the room with the lights out. Pt is alert & oriented x 4 and engages in today's visit. Today, panic states she came in for evaluation due to having back to back panic attacks for the past 2 weeks. States these attacks are triggered by feel like a burden to my family and today I feel like I want to crash. States I feel so jittery and like something is going to happen. States she was previously prescribed Celexa , however, discontinued this in July because I felt like it wasn't helping. States she has had previous trials of gabapentin , Latuda  (stopped in Nov after 2 days due to agitation), Effexor, Wellbutrin  (palpitations), Saphris, Buspar , Zoloft ,  Abilify  (gave me seizures). She reports mental health diagnoses of bipolar disorder, depression, and GAD. She receives counseling through [facility].States he has an appt with a new psychiatrist on Jan. 26. States anxiety is also worsened by folliculitis. This has been an ongoing issue since receiving a bikini wax in November and she has medical treatment for this on a few different occasions. She denies suicidal or homicidal intent, ideation or plan. States last suicide attempt was over 10 years ago via overdose on medication. Denies AVH.   Flowsheet Row ED from 05/03/2024 in [facility] UC from 04/25/2024 in [facility] Visit from 04/14/2024 in [facility]  C-SSRS RISK CATEGORY No Risk No Risk Error: Q7 should not be populated when Q6 is No    Psychiatric Specialty Exam  Presentation  General Appearance:Appropriate for Environment  Eye Contact:Good  Speech:Clear and Coherent  Speech Volume:Normal  Handedness:Right   Mood and Affect  Mood: Anxious  Affect: Appropriate   Thought Process  Thought Processes: Coherent  Descriptions of Associations:Intact  Orientation:Full (Time, Place and Person)  Thought Content:WDL  Diagnosis of Schizophrenia or Schizoaffective disorder in past: No data recorded  Hallucinations:None  Ideas of Reference:None  Suicidal Thoughts:No  Homicidal Thoughts:No   Sensorium  Memory: Immediate Fair; Recent Fair; Remote Fair  Judgment: Fair  Insight: Fair   Art Therapist  Concentration: Fair  Attention Span: Fair  Recall: [employer] of Knowledge: Fair  Language: Fair   Psychomotor Activity  Psychomotor Activity: Normal   Assets  Assets: Manufacturing Systems Engineer; Desire for Improvement; Financial Resources/Insurance; Housing; Physical Health; Social Support   Sleep  Sleep: Good  Number of hours: No data  recorded  Physical Exam: Physical Exam Vitals and nursing note reviewed.  HENT:     Head: Normocephalic.     Mouth/Throat:     Mouth: Mucous membranes are moist.  Cardiovascular:     Rate and Rhythm: Normal rate.  Pulmonary:     Effort: Pulmonary effort is normal.  Skin:    General: Skin is warm and dry.  Neurological:     Mental Status: She is alert and oriented to person, place, and time.  Psychiatric:     Comments: See HPI    Review of Systems  Constitutional:  Negative for chills and fever.  HENT:  Negative for congestion and sore throat.   Respiratory:  Negative for cough and shortness of breath.   Cardiovascular:  Negative for chest pain and palpitations.  Gastrointestinal:  Negative for constipation, nausea and vomiting.  Psychiatric/Behavioral:  Negative for depression, hallucinations, substance abuse and suicidal ideas. The patient is nervous/anxious.    Blood pressure 124/86, pulse 85, temperature 98.7 F (37.1 C), temperature source Oral, resp. rate 18, SpO2 97%. There is no height or weight on file to calculate BMI.  Musculoskeletal: Strength & Muscle Tone: within normal limits Gait & Station: normal Patient leans: N/A   [facility] MSE Discharge Disposition for Follow up and Recommendations: Based on my evaluation the patient does not appear to have an emergency medical condition and can be discharged with resources and follow up care in outpatient services for Medication Management and Individual Therapy  A prescription for hydroxyzine  pamoate 25 mg PO BID prn anxiety was sent to [facility]. Pt advised to take as directed and to keep follow up appointment on 05/19/2024. No acute safety concerns or active suicidal ideation are present at the time of discharge.   The patient has been provided with outpatient resources. Crisis contact information and instructions for accessing emergency services has also been provided. The patient is aware of their discharge plan,  demonstrates insight into their condition, and agrees to adhere to the recommended follow-up care.   Sherrell Culver, PMHNP-BC, FNP-BC  05/03/2024, 4:13 PM

## 2024-05-05 NOTE — Telephone Encounter (Signed)
 Pt called asking if she can get a refill on the antibiotic cream.  She would like it sent to [facility].

## 2024-05-07 ENCOUNTER — Ambulatory Visit: Payer: Self-pay | Admitting: Nurse Practitioner

## 2024-05-07 ENCOUNTER — Ambulatory Visit (HOSPITAL_BASED_OUTPATIENT_CLINIC_OR_DEPARTMENT_OTHER)

## 2024-05-07 VITALS — BP 120/82 | HR 78 | Ht 64.0 in | Wt 179.0 lb

## 2024-05-07 DIAGNOSIS — F3181 Bipolar II disorder: Secondary | ICD-10-CM | POA: Diagnosis not present

## 2024-05-07 DIAGNOSIS — F411 Generalized anxiety disorder: Secondary | ICD-10-CM

## 2024-05-07 DIAGNOSIS — F410 Panic disorder [episodic paroxysmal anxiety] without agoraphobia: Secondary | ICD-10-CM

## 2024-05-07 MED ORDER — ARIPIPRAZOLE 2 MG PO TABS: 2.0000 mg | ORAL_TABLET | Freq: Every day | ORAL | 0 refills | Status: DC

## 2024-05-07 MED ORDER — HYDROXYZINE PAMOATE 25 MG PO CAPS: 25.0000 mg | ORAL_CAPSULE | Freq: Two times a day (BID) | ORAL | 0 refills | Status: DC | PRN

## 2024-05-07 MED ORDER — CYCLOBENZAPRINE HCL 5 MG PO TABS: ORAL_TABLET | ORAL | 1 refills | Status: AC

## 2024-05-07 NOTE — Progress Notes (Signed)
 BH MD/PA/NP OP Progress Note  05/07/2024 2:24 PM [name]  MRN:  [id number]  Visit Diagnosis:    ICD-10-CM   1. Bipolar 2 disorder (HCC)  F31.81 ARIPiprazole  (ABILIFY ) 2 MG tablet    2. Panic disorder without agoraphobia  F41.0 hydrOXYzine  (VISTARIL ) 25 MG capsule    3. GAD (generalized anxiety disorder)  F41.1 hydrOXYzine  (VISTARIL ) 25 MG capsule      Assessment:  [name] presents for follow-up evaluation. Today, 05/07/2024, patient reports anxiety that has been progressively getting worse associated around the fear of having illnesses, taking medications and having side effects.  She has had similar behaviors in the past, resolved and year ago however they have progressed recently after she had an affair with an individual leading to stress and anxiety.  She is not depressed, is not actively passively homicidal or suicidal and has been having a lot of nervousness around medication and illnesses.  She is not using any substances including alcohol or cigarettes.  She was prescribed gabapentin  recently however she developed dizziness and she discontinued it.  In the past she has been on multiple medications, is likely highly reactive to the increased doses of medications.  Hydroxyzine  has helped her with anxiety and sleep and she is taking magnesium  over-the-counter for the same.  She has a good therapeutic relationship with a therapist, encouraged her to keep up with the therapy appointments as she was significantly benefit from it.  Educational counseling was done at the current visit however the patient was not amenable to being on any mood stabilizers currently.  She was amenable to trying Abilify  at a lower dose of 2 mg, discussed the risk benefits and side effects of medications.  She was highly passive towards any medications that would cause weight gain.  Will have her back in the clinic in 4 weeks.   Risk Assessment: An assessment of suicide and violence risk factors  was performed as part of this evaluation and is not significantly changed from the last visit. While future psychiatric events cannot be accurately predicted, the patient does not currently require acute inpatient psychiatric care and does not currently meet [place]  involuntary commitment criteria. Patient was given contact information for crisis resources, behavioral health clinic and was instructed to call 911 for emergencies.   Plan: # Bipolar 2 disorder Past medication trials:  Status of problem: Current Interventions: -- Patient discontinued gabapentin  due to drowsiness -- Start Abilify  2 mg daily, does have a history of seizures however patient denied  # GAD/Panic attacks Past medication trials:  Status of problem: Current Interventions: -- Continue hydroxyzine  25 mg twice daily as needed -- Patient is using over-the-counter magnesium   Chief Complaint:  Chief Complaint  Patient presents with   Follow-up   Medication Refill   HPI:  [name] is a 44 year old female with a history of depression, ADHD, bipolar 2 disorder, panic disorder, personality disorder, previous suicidal ideation self-injurious behaviors presented to the clinic today for follow-up.  She has tried multiple medications in the past hydroxyzine , Buspar , Wellbutrin , Celexa , Effexor, gabapentin , Latuda  (made suicidal), Abilify  (disliked), Lamictal ,  Zyprexa  (dislikes), Ambien , Zoloft , and Trintellix.  She was recently prescribed gabapentin  however stopped taking it after experiencing dizziness. Today, she presented to the clinic unaccompanied and reported that I am all over the place, anxiety is bad .  Reported that she has been googling I look all the stuff up and I think I have this, thinking something is wrong with me, something is  wrong with my face .  Reported that she has been looking for her medications, has been watching [employer].  Reported that I am losing control .  Reported she is  currently not on medications apart from hydroxyzine  and magnesium  it helps ignores .  Reported that I am scared of my psychiatric medications, they make me suicidal .  Reported that she was diagnosed with bipolar 2 in 2012, I had a lot of manic episodes .  Reported that she was sleeping less and talking a lot more during those episodes.  She reported taking the past she has been impulsively tried to cut but not recently.  Also stated that 2 months ago I had an affair .  And things got worse after that.  Stated I think I have a disease .  She currently reported good appetite, stated that she sleeps from 9:30 PM till 5 AM and has been taking hydroxyzine  before bedtime and tea in the morning.  He reported that I am not depressed it is just anxiety .  She denied any active or passive SI/HI/AVH.  Stated that it is more nervousness .  Reported that she has recently started school for early childhood, autism and she is looking forward to it.  She denied using any substances including alcohol or cigarettes. Stated that she does not want to be on any medication that causes weight gain, discussed about Latuda  and Lamictal  and the patient declined both of them.  Discussed about Abilify  however the patient stated that she would like to start at a lower dose, she has a history of seizures however she was not sure if that was being caused by Zoloft  or Abilify  and she was amenable to starting it.  She is also undergoing therapy has good therapeutic relationship, encouraged her to keep up with the therapy appointments. Will have her back in the clinic in 4 weeks.   Past Psychiatric History: Depression, ADHD, Bipolar 2, Panic Disorder with agoraphobia, borderline personality disorder, SI (admitted in 2012 for jumping out of the building), self mutualization (cutting)   Past Medical History:  Past Medical History:  Diagnosis Date   Acute vaginitis 03/31/2022   Anemia    h/o   Anginal pain    pt. takes  nitrostat - as needed, hasn't had since 04/2012, sees Dr. 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 2013   Nausea without vomiting 08/07/2023   Neuromuscular disorder (HCC)    muscle spasms-back & arms    Other fatigue 02/21/2022   Palpitations 12/17/2023   Papanicolaou smear for cervical cancer screening 02/19/2023   Paresthesias 12/17/2023   Sensation of feeling cold 12/17/2023   SVT (supraventricular tachycardia) 01/22/2012   Tachycardia 12/17/2023   UTI (lower urinary tract infection) 01/20/2012   Vision changes 12/11/2021    Past Surgical History:  Procedure Laterality Date   ABDOMINAL HYSTERECTOMY     PERIPHERALLY INSERTED CENTRAL CATHETER INSERTION     during event of Meningitis- then d/c'd to home /w & treated /w antibiotics    SUBOCCIPITAL CRANIECTOMY CERVICAL LAMINECTOMY N/A 07/22/2012   Procedure: SUBOCCIPITAL CRANIECTOMY CERVICAL LAMINECTOMY/DURAPLASTY;  Surgeon:  Lamar LELON Peaches, MD;  Location: [facility];  Service: Neurosurgery;  Laterality: N/A;  Suboccipital craniectomy with upper cervial laminectomy with duroplasty    TUBAL LIGATION     VAGINAL DELIVERY     x3   WISDOM TOOTH EXTRACTION      Family Psychiatric History: Cousin drug use, anxiety on paternal family, father alcohol use   Family History:  Family History  Problem Relation Age of Onset   Heart attack Mother    Healthy Father    Colon cancer Neg Hx    Esophageal cancer Neg Hx     Social History:  Social History   Socioeconomic History   Marital status: Married    Spouse name: Not on file   Number of children: Not on file   Years of education: Not on file   Highest  education level: GED or equivalent  Occupational History   Not on file  Tobacco Use   Smoking status: Never    Passive exposure: Never   Smokeless tobacco: Never  Vaping Use   Vaping status: Never Used  Substance and Sexual Activity   Alcohol use: No   Drug use: No   Sexual activity: Yes    Birth control/protection: Surgical  Other Topics Concern   Not on file  Social History Narrative   Not on file   Social Drivers of Health   Tobacco Use: Low Risk (04/25/2024)   Patient History    Smoking Tobacco Use: Never    Smokeless Tobacco Use: Never    Passive Exposure: Never  Financial Resource Strain: Medium Risk (08/13/2023)   Overall Financial Resource Strain (CARDIA)    Difficulty of Paying Living Expenses: Somewhat hard  Food Insecurity: No Food Insecurity (03/31/2024)   Epic    Worried About Radiation Protection Practitioner of Food in the Last Year: Never true    Ran Out of Food in the Last Year: Never true  Transportation Needs: No Transportation Needs (03/31/2024)   Epic    Lack of Transportation (Medical): No    Lack of Transportation (Non-Medical): No  Physical Activity: Insufficiently Active (03/31/2024)   Exercise Vital Sign    Days of Exercise per Week: 3 days    Minutes of Exercise per Session: 30 min  Stress: Stress Concern Present (03/31/2024)   [employer] of Occupational Health - Occupational Stress Questionnaire    Feeling of Stress: Rather much  Social Connections: Socially Integrated (03/31/2024)   Social Connection and Isolation Panel    Frequency of Communication with Friends and Family: More than three times a week    Frequency of Social Gatherings with Friends and Family: More than three times a week    Attends Religious Services: More than 4 times per year    Active Member of Clubs or Organizations: Yes    Attends Banker Meetings: More than 4 times per year    Marital Status: Married  Depression (PHQ2-9): High Risk (04/14/2024)   Depression (PHQ2-9)     PHQ-2 Score: 19  Alcohol Screen: Not on file  Housing: Unknown (03/31/2024)   Epic    Unable to Pay for Housing in the Last Year: No    Number of Times Moved in the Last Year: Not on file    Homeless in the Last Year: No  Utilities: Not At Risk (03/31/2024)   Epic    Threatened with loss of utilities: No  Health Literacy: Adequate Health Literacy (03/31/2024)   B1300 Health Literacy    Frequency  of need for help with medical instructions: Never    Allergies: Allergies[1]  Current Medications: Current Outpatient Medications  Medication Sig Dispense Refill   ARIPiprazole  (ABILIFY ) 2 MG tablet Take 1 tablet (2 mg total) by mouth daily. 30 tablet 0   albuterol  (VENTOLIN  HFA) 108 (90 Base) MCG/ACT inhaler INHALE 1-2 PUFFS BY MOUTH EVERY 6 HOURS AS NEEDED FOR WHEEZE OR SHORTNESS OF BREATH 18 each 3   ammonium lactate  (AMLACTIN) 12 % cream Apply 1 Application topically as needed for dry skin. 385 g 2   benzoyl peroxide  5 % external liquid Cleanse affected area twice a day, pat dry and apply prescribed clindamycin  gel 148 g 0   Cholecalciferol (VITAMIN D3) 1.25 MG (50000 UT) CAPS Take 1 capsule by mouth once a week.     clindamycin  (CLINDAGEL) 1 % gel Apply thin layer to affected areas two times a day after cleansing with benzoyl peroxide  30 g 0   dicyclomine  (BENTYL ) 20 MG tablet Take 1 tablet (20 mg total) by mouth 3 (three) times daily as needed for spasms (Belly cramping.). 90 tablet 3   hydrOXYzine  (VISTARIL ) 25 MG capsule Take 1 capsule (25 mg total) by mouth 2 (two) times daily as needed for anxiety. 60 capsule 0   ibuprofen  (ADVIL ) 800 MG tablet TAKE 1 TABLET BY MOUTH 2 TIMES DAILY AS NEEDED. 60 tablet 1   meclizine  (ANTIVERT ) 12.5 MG tablet Take 1 tablet (12.5 mg total) by mouth 3 (three) times daily as needed for dizziness (vertigo). 12 tablet 0   pantoprazole  (PROTONIX ) 40 MG tablet Take 1 tablet (40 mg total) by mouth daily. 90 tablet 1   No current facility-administered medications  for this visit.     Musculoskeletal: Strength & Muscle Tone: within normal limits Gait & Station: normal Patient leans: N/A  Psychiatric Specialty Exam: Blood pressure 120/82, pulse 78, height 5' 4 (1.626 m), weight 179 lb (81.2 kg).Body mass index is 30.73 kg/m. Review of Systems   General Appearance: Neat and Well Groomed  Eye Contact:  Good  Speech:  Clear and Coherent and Normal Rate; no latency on interview today  Volume:  Normal  Mood: Eythymic  Affect:  Euthymic; calm; mildly anxious  Thought Content: Normal  Suicidal Thoughts:  No  Homicidal Thoughts:  No  Thought Process:  Goal Directed and Linear  Orientation:  Full (Time, Place, and Person)    Memory:  Grossly intact   Judgment:  Fair  Insight:  Fair  Concentration:  Concentration: Fair  Recall:  not formally assessed   Fund of Knowledge: Good  Language: Good  Psychomotor Activity:  Normal  Akathisia:  No  AIMS (if indicated): not done  Assets:  Communication Skills Desire for Improvement Housing Physical Health Talents/Skills  ADL's:  Intact  Cognition: WNL  Sleep:  Fair     Metabolic Disorder Labs: Lab Results  Component Value Date   HGBA1C 5.8 (H) 12/17/2023   No results found for: PROLACTIN Lab Results  Component Value Date   CHOL 141 12/17/2023   TRIG 65 12/17/2023   HDL 57 12/17/2023   CHOLHDL 2.5 12/17/2023   VLDL 7 09/29/2008   LDLCALC 71 12/17/2023   LDLCALC 70 02/01/2023   Lab Results  Component Value Date   TSH 1.300 12/17/2023   TSH 1.600 02/01/2023    Therapeutic Level Labs: Lab Results  Component Value Date   LITHIUM  <0.06 (L) 04/18/2017   No results found for: VALPROATE No results found for: CBMZ   Screenings:  GAD-7    Flowsheet Row Office Visit from 04/14/2024 in [facility] Office Visit from 12/17/2023 in [facility] Office Visit from 11/14/2023 in [facility] Office Visit from 10/10/2022 in [place]  [facility] Office Visit from 11/03/2021 in [facility] at [facility]  Total GAD-7 Score 15 13 16 10 12    PHQ2-9    Flowsheet Row Office Visit from 04/14/2024 in [facility] Clinical Support from 03/31/2024 in [facility] ED from 03/14/2024 in [facility] ED from 03/12/2024 in [facility] Office Visit from 12/17/2023 in [facility]  PHQ-2 Total Score 4 2 2 2 1   PHQ-9 Total Score 19 4 4 4 8    Flowsheet Row ED from 05/03/2024 in [facility] UC from 04/25/2024 in [facility] Visit from 04/14/2024 in [facility]  C-SSRS RISK CATEGORY No Risk No Risk Error: Q7 should not be populated when Q6 is No    Collaboration of Care: Collaboration of Care: Medication Management AEB Dr. Carvin  Patient/Guardian was advised Release of Information must be obtained prior to any record release in order to collaborate their care with an outside provider. Patient/Guardian was advised if they have not already done so to contact the registration department to sign all necessary forms in order for us  to release information regarding their care.   Consent: Patient/Guardian gives verbal consent for treatment and assignment of benefits for services provided during this visit. Patient/Guardian expressed understanding and agreed to proceed.    Deaundra Kutzer, MD 05/07/2024, 2:24 PM     [1]  Allergies Allergen Reactions   Amoxicillin Anaphylaxis and Hives    Has patient had a PCN reaction causing immediate rash, facial/tongue/throat swelling, SOB or lightheadedness with hypotension: Yes Has patient had a PCN reaction causing severe rash involving mucus membranes or skin necrosis: Yes Has patient had a PCN reaction that required hospitalization No Has patient had a PCN reaction  occurring within the last 10 years: No If all of the above answers are NO, then may proceed with Cephalosporin use.    Imitrex [Sumatriptan Base] Anaphylaxis and Hives   Penicillins Anaphylaxis and Hives    Has patient had a PCN reaction causing immediate rash, facial/tongue/throat swelling, SOB or lightheadedness with hypotension: Yes Has patient had a PCN reaction causing severe rash involving mucus membranes or skin necrosis: Yes Has patient had a PCN reaction that required hospitalization No Has patient had a PCN reaction occurring within the last 10 years: No If all of the above answers are NO, then may proceed with Cephalosporin use.    Tramadol Shortness Of Breath and Palpitations   Abilify  [Aripiprazole ]     seizures   Latuda  [Lurasidone ] Other (See Comments)    Suicidal ideations    Norco [Hydrocodone -Acetaminophen ] Other (See Comments)    Hallucinations    Percocet [Oxycodone -Acetaminophen ] Other (See Comments)    Hallucinations    Soy Allergy (Obsolete) Hives

## 2024-05-07 NOTE — Progress Notes (Unsigned)
 BH MD/PA/NP OP Progress Note  05/07/2024 8:42 AM [name]  MRN:  [id number]  Visit Diagnosis: No diagnosis found.  Assessment:  [name] presents for follow-up evaluation. Today, 05/07/2024, patient reports ***  Risk Assessment: An assessment of suicide and violence risk factors was performed as part of this evaluation and is not significantly changed from the last visit. While future psychiatric events cannot be accurately predicted, the patient does not currently require acute inpatient psychiatric care and does not currently meet [facility]  involuntary commitment criteria. Patient was given contact information for crisis resources, behavioral health clinic and was instructed to call 911 for emergencies.   1. Bipolar 2 disorder (HCC) (Primary)   Start- gabapentin  (NEURONTIN ) 100 MG capsule; Take 1 capsule (100 mg total) by mouth 3 (three) times daily.  Dispense: 90 capsule; Refill: 3   2. Panic disorder without agoraphobia   Start- gabapentin  (NEURONTIN ) 100 MG capsule; Take 1 capsule (100 mg total) by mouth 3 (three) times daily.  Dispense: 90 capsule; Refill: 3   Chief Complaint: No chief complaint on file.  HPI:   44 year old female seen today for initial psychiatric evaluation. She was referred to outpatient psychiatry by [facility] where she was seen on 04/03/2024 for having panic attacks.  She has a psychiatric history of Depression, ADHD, Bipolar 2, Panic Disorder with agoraphobia, borderline personality disorder, SI (admitted in 2012 for jumping out of the building), self mutualization (cutting). Currently she is not managed on medications but as tried hydroxyzine , Buspar , Wellbutrin , Celexa , Effexor, gabapentin , Latuda  (made suicidal), Abilify  (disliked), Lamictal ,  Zyprexa  (dislikes), Ambien , Zoloft , and Trintellix.  She reports that she discontinued Celexa  about 5 months ago.   Today patient logged in virtually but her camera was turned off. During exam  she was pleasant, cooperative, and engaged in conversation.  She informed clinical research associate that she becomes overwhelmed easily.  She notes that when she becomes overwhelmed she goes into panic.  Patient notes that she panics over illnesses she believes she may have. She informed clinical research associate that at one point she thought that because she had bumps on her face she has skin cancer.  She also endorses she has been having chest pain and at times thought she was having a heart attack but later determined it was a panic attack.  She notes that she was seen by cardiology and was informed that her heart health was good.  Patient also has had several lab workups which indicated no abnormalities.  During panic attacks patient reports she is short of breath, sweating, palpitations, and chest pain.  Today provider conducted GAD-7 the patient scored a 15.   Patient denies alcohol, tobacco, or illegal drug use.  She informed clinical research associate that one of her stressors includes quitting her job at the [employer].  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  Patient reports that she has increased stress recently to the notes that she worries about her husband, her 3 children, and a family event that will be held her to the house.  Patient notes that she does not want this event to occur at her house and is having increased anxiety.  Today provider conducted PHQ-9 and patient scored a 19.  She endorses adequate sleep and reduced appetite.  Patient notes that she has been trying to eat healthy and she was told that she was prediabetic.  She notes that  she has lost 20 pounds.  Today she endorses passive SI but denies wanting to harm herself.  She denies SI/HI/AVH.     Patient describes having manic behaviors.  She informed clinical research associate that on several occasions she impulsively cut her beer.  She also notes that she had a long-term affair with other men.  At times  she reports that she felt that she had sexually transmitted diseases because of her affairs.  Patient describes being distracted, have racing thoughts, and fluctuations in mood.   Patient requested not to start antipsychotic as in the past it caused suicidal ideations.  Provider recommended a mood stabilizer.  Patient not agreeable to Lamictal  or Tegretol but was agreeable to trialing gabapentin  100 mg 3 times daily to help manage mood and anxiety.  Patient found Celexa  somewhat effective in the past in controlling her depression.  Provider informed patient that antidepressant can worsen manic behaviors.  She endorsed understanding.  [facility] Potential side effects of medication and risks vs benefits of treatment vs non-treatment were explained and discussed. All questions were answered.  No other concerns noted at this time.     Past Psychiatric History: Depression, ADHD, Bipolar 2, Panic Disorder with agoraphobia, borderline personality disorder, SI (admitted in 2012 for jumping out of the building), self mutualization (cutting)   Past Medical History:  Past Medical History:  Diagnosis Date   Acute vaginitis 03/31/2022   Anemia    h/o   Anginal pain    pt. takes nitrostat - as needed, hasn't had since 04/2012, sees Dr. 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 2013   Nausea without vomiting 08/07/2023   Neuromuscular disorder (HCC)    muscle spasms-back & arms    Other fatigue 02/21/2022   Palpitations 12/17/2023   Papanicolaou smear for cervical cancer screening 02/19/2023   Paresthesias 12/17/2023    Sensation of feeling cold 12/17/2023   SVT (supraventricular tachycardia) 01/22/2012   Tachycardia 12/17/2023   UTI (lower urinary tract infection) 01/20/2012   Vision changes 12/11/2021    Past Surgical History:  Procedure Laterality Date   ABDOMINAL HYSTERECTOMY     PERIPHERALLY INSERTED CENTRAL CATHETER INSERTION     during event of Meningitis- then d/c'd to home /w & treated /w antibiotics    SUBOCCIPITAL CRANIECTOMY CERVICAL LAMINECTOMY N/A 07/22/2012   Procedure: SUBOCCIPITAL CRANIECTOMY CERVICAL LAMINECTOMY/DURAPLASTY;  Surgeon: Lamar LELON Peaches, MD;  Location: [facility];  Service: Neurosurgery;  Laterality: N/A;  Suboccipital craniectomy with upper cervial laminectomy with duroplasty    TUBAL LIGATION     VAGINAL DELIVERY     x3   WISDOM TOOTH EXTRACTION      Family Psychiatric History:  Cousin drug use, anxiety on paternal family, father alcohol use   Family History:  Family History  Problem Relation Age of Onset   Heart attack Mother    Healthy Father    Colon cancer Neg Hx    Esophageal cancer Neg Hx     Social History:  Social History   Socioeconomic History   Marital status: Married    Spouse name:  Not on file   Number of children: Not on file   Years of education: Not on file   Highest education level: GED or equivalent  Occupational History   Not on file  Tobacco Use   Smoking status: Never    Passive exposure: Never   Smokeless tobacco: Never  Vaping Use   Vaping status: Never Used  Substance and Sexual Activity   Alcohol use: No   Drug use: No   Sexual activity: Yes    Birth control/protection: Surgical  Other Topics Concern   Not on file  Social History Narrative   Not on file   Social Drivers of Health   Tobacco Use: Low Risk (04/25/2024)   Patient History    Smoking Tobacco Use: Never    Smokeless Tobacco Use: Never    Passive Exposure: Never  Financial Resource Strain: Medium Risk (08/13/2023)   Overall Financial Resource Strain  (CARDIA)    Difficulty of Paying Living Expenses: Somewhat hard  Food Insecurity: No Food Insecurity (03/31/2024)   Epic    Worried About Radiation Protection Practitioner of Food in the Last Year: Never true    Ran Out of Food in the Last Year: Never true  Transportation Needs: No Transportation Needs (03/31/2024)   Epic    Lack of Transportation (Medical): No    Lack of Transportation (Non-Medical): No  Physical Activity: Insufficiently Active (03/31/2024)   Exercise Vital Sign    Days of Exercise per Week: 3 days    Minutes of Exercise per Session: 30 min  Stress: Stress Concern Present (03/31/2024)   [employer] of Occupational Health - Occupational Stress Questionnaire    Feeling of Stress: Rather much  Social Connections: Socially Integrated (03/31/2024)   Social Connection and Isolation Panel    Frequency of Communication with Friends and Family: More than three times a week    Frequency of Social Gatherings with Friends and Family: More than three times a week    Attends Religious Services: More than 4 times per year    Active Member of Clubs or Organizations: Yes    Attends Banker Meetings: More than 4 times per year    Marital Status: Married  Depression (PHQ2-9): High Risk (04/14/2024)   Depression (PHQ2-9)    PHQ-2 Score: 19  Alcohol Screen: Not on file  Housing: Unknown (03/31/2024)   Epic    Unable to Pay for Housing in the Last Year: No    Number of Times Moved in the Last Year: Not on file    Homeless in the Last Year: No  Utilities: Not At Risk (03/31/2024)   Epic    Threatened with loss of utilities: No  Health Literacy: Adequate Health Literacy (03/31/2024)   B1300 Health Literacy    Frequency of need for help with medical instructions: Never    Allergies: Allergies[1]  Current Medications: Current Outpatient Medications  Medication Sig Dispense Refill   albuterol  (VENTOLIN  HFA) 108 (90 Base) MCG/ACT inhaler INHALE 1-2 PUFFS BY MOUTH EVERY 6 HOURS AS NEEDED FOR  WHEEZE OR SHORTNESS OF BREATH 18 each 3   ammonium lactate  (AMLACTIN) 12 % cream Apply 1 Application topically as needed for dry skin. 385 g 2   benzoyl peroxide  5 % external liquid Cleanse affected area twice a day, pat dry and apply prescribed clindamycin  gel 148 g 0   Cholecalciferol (VITAMIN D3) 1.25 MG (50000 UT) CAPS Take 1 capsule by mouth once a week.     clindamycin  (CLINDAGEL) 1 %  gel Apply thin layer to affected areas two times a day after cleansing with benzoyl peroxide  30 g 0   dicyclomine  (BENTYL ) 20 MG tablet Take 1 tablet (20 mg total) by mouth 3 (three) times daily as needed for spasms (Belly cramping.). 90 tablet 3   hydrOXYzine  (VISTARIL ) 25 MG capsule Take 1 capsule (25 mg total) by mouth 2 (two) times daily as needed for anxiety. 30 capsule 0   ibuprofen  (ADVIL ) 800 MG tablet TAKE 1 TABLET BY MOUTH 2 TIMES DAILY AS NEEDED. 60 tablet 1   meclizine  (ANTIVERT ) 12.5 MG tablet Take 1 tablet (12.5 mg total) by mouth 3 (three) times daily as needed for dizziness (vertigo). 12 tablet 0   pantoprazole  (PROTONIX ) 40 MG tablet Take 1 tablet (40 mg total) by mouth daily. 90 tablet 1   No current facility-administered medications for this visit.     Musculoskeletal: Strength & Muscle Tone: within normal limits Gait & Station: normal Patient leans: N/A  Psychiatric Specialty Exam: There were no vitals taken for this visit.There is no height or weight on file to calculate BMI. Review of Systems   General Appearance: Neat and Well Groomed  Eye Contact:  Good  Speech:  Clear and Coherent and Normal Rate; no latency on interview today  Volume:  Normal  Mood: Eythymic  Affect:  Euthymic; calm; mildly anxious  Thought Content: Normal  Suicidal Thoughts:  No  Homicidal Thoughts:  No  Thought Process:  Goal Directed and Linear  Orientation:  Full (Time, Place, and Person)    Memory:  Grossly intact   Judgment:  Fair  Insight:  Fair  Concentration:  Concentration: Fair  Recall:   not formally assessed   Fund of Knowledge: Good  Language: Good  Psychomotor Activity:  Normal  Akathisia:  No  AIMS (if indicated): not done  Assets:  Communication Skills Desire for Improvement Housing Physical Health Talents/Skills  ADL's:  Intact  Cognition: WNL  Sleep:  Fair     Metabolic Disorder Labs: Lab Results  Component Value Date   HGBA1C 5.8 (H) 12/17/2023   No results found for: PROLACTIN Lab Results  Component Value Date   CHOL 141 12/17/2023   TRIG 65 12/17/2023   HDL 57 12/17/2023   CHOLHDL 2.5 12/17/2023   VLDL 7 09/29/2008   LDLCALC 71 12/17/2023   LDLCALC 70 02/01/2023   Lab Results  Component Value Date   TSH 1.300 12/17/2023   TSH 1.600 02/01/2023    Therapeutic Level Labs: Lab Results  Component Value Date   LITHIUM  <0.06 (L) 04/18/2017   No results found for: VALPROATE No results found for: CBMZ   Screenings: GAD-7    Flowsheet Row Office Visit from 04/14/2024 in [facility] Office Visit from 12/17/2023 in [facility] Office Visit from 11/14/2023 in [facility] Office Visit from 10/10/2022 in [facility] Office Visit from 11/03/2021 in [facility]  Total GAD-7 Score 15 13 16 10 12    PHQ2-9    Flowsheet Row Office Visit from 04/14/2024 in [facility] Clinical Support from 03/31/2024 in [facility] ED from 03/14/2024 in [facility] ED from 03/12/2024 in [facility] Office Visit from 12/17/2023 in [facility]  PHQ-2 Total Score 4 2 2 2 1   PHQ-9 Total Score 19 4 4 4 8    Flowsheet Row ED from 05/03/2024 in [facility] UC from 04/25/2024 in [facility] Visit from 04/14/2024 in [facility]  C-SSRS RISK CATEGORY No Risk No  Risk Error: Q7 should not be populated when Q6 is No    Collaboration of Care: Collaboration of Care: Medication Management AEB ***  Patient/Guardian was advised Release of Information must be obtained prior to any record release in order to collaborate their care with an outside provider. Patient/Guardian was advised if they have not already done so to contact the registration department to sign all necessary forms in order for us  to release information regarding their care.   Consent: Patient/Guardian gives verbal consent for treatment and assignment of benefits for services provided during this visit. Patient/Guardian expressed understanding and agreed to proceed.    Keila Turan, MD 05/07/2024, 8:42 AM    [1]  Allergies Allergen Reactions   Amoxicillin Anaphylaxis and Hives    Has patient had a PCN reaction causing immediate rash, facial/tongue/throat swelling, SOB or lightheadedness with hypotension: Yes Has patient had a PCN reaction causing severe rash involving mucus membranes or skin necrosis: Yes Has patient had a PCN reaction that required hospitalization No Has patient had a PCN reaction occurring within the last 10 years: No If all of the above answers are NO, then may proceed with Cephalosporin use.    Imitrex [Sumatriptan Base] Anaphylaxis and Hives   Penicillins Anaphylaxis and Hives    Has patient had a PCN reaction causing immediate rash, facial/tongue/throat swelling, SOB or lightheadedness with hypotension: Yes Has patient had a PCN reaction causing severe rash involving mucus membranes or skin necrosis: Yes Has patient had a PCN reaction that required hospitalization No Has patient had a PCN reaction occurring within the last 10 years: No If all of the above answers are NO, then may proceed with Cephalosporin use.    Tramadol Shortness Of Breath and Palpitations   Abilify  [Aripiprazole ]     seizures   Latuda  [place].borders ] Other (See Comments)    Suicidal  ideations    Norco [Hydrocodone -Acetaminophen ] Other (See Comments)    Hallucinations    Percocet [Oxycodone -Acetaminophen ] Other (See Comments)    Hallucinations    Soy Allergy (Obsolete) Hives

## 2024-05-07 NOTE — Addendum Note (Signed)
 Addended by: CARVIN CROCK on: 05/07/2024 05:07 PM   Modules accepted: Level of Service

## 2024-05-08 ENCOUNTER — Telehealth (HOSPITAL_COMMUNITY): Payer: Self-pay | Admitting: *Deleted

## 2024-05-08 ENCOUNTER — Ambulatory Visit (HOSPITAL_COMMUNITY)
Admission: EM | Admit: 2024-05-08 | Discharge: 2024-05-08 | Disposition: A | Discharge status: Home / Self Care | Attending: Family Medicine | Admitting: Family Medicine

## 2024-05-08 ENCOUNTER — Encounter (HOSPITAL_COMMUNITY): Payer: Self-pay

## 2024-05-08 DIAGNOSIS — R300 Dysuria: Secondary | ICD-10-CM | POA: Diagnosis not present

## 2024-05-08 DIAGNOSIS — N898 Other specified noninflammatory disorders of vagina: Secondary | ICD-10-CM | POA: Diagnosis not present

## 2024-05-08 LAB — POCT URINE DIPSTICK
Bilirubin, UA: NEGATIVE
Blood, UA: NEGATIVE
Glucose, UA: NEGATIVE mg/dL
Ketones, POC UA: NEGATIVE mg/dL
Leukocytes, UA: NEGATIVE
Nitrite, UA: NEGATIVE
POC PROTEIN,UA: NEGATIVE
Spec Grav, UA: 1.015
Urobilinogen, UA: 0.2 U/dL
pH, UA: 7

## 2024-05-08 NOTE — Discharge Instructions (Addendum)
 I do not see any signs of skin infection or follicle infection but I do see some skin irritation around your vagina. We are going to leave this area alone for a week or two and give the skin time to heal. No new prescription medications. Keep your normal bathing schedule.  If needed, you may try over the counter Dermoplast spray to ease any discomfort.

## 2024-05-08 NOTE — ED Provider Notes (Signed)
 " [facility]   [id number] 05/08/24 Arrival Time: 9144  ASSESSMENT & PLAN:  1. Vaginal irritation   2. Dysuria       Discharge Instructions      I do not see any signs of skin infection or follicle infection but I do see some skin irritation around your vagina. We are going to leave this area alone for a week or two and give the skin time to heal. No new prescription medications. Keep your normal bathing schedule.  If needed, you may try over the counter Dermoplast spray to ease any discomfort.    U/A normal.  Labs Reviewed  POCT URINE DIPSTICK   Reviewed expectations re: course of current medical issues. Questions answered. Outlined signs and symptoms indicating need for more acute intervention. Patient verbalized understanding. After Visit Summary given.   SUBJECTIVE:  [name] is a 44 y.o. female who has been seen here a few times with vaginal folliculitis; here for recheck. Feels area is improving. Did recently pour witch hazel over vagina; ques if this irritated skin more. Does complain of dysuria.   No LMP recorded. Patient has had a hysterectomy.   OBJECTIVE:  Vitals:   05/08/24 0928  BP: 110/67  Pulse: 91  Resp: 14  Temp: 98 F (36.7 C)  TempSrc: Oral  SpO2: 99%     General appearance: alert, cooperative, appears stated age and no distress GU: (nurse chaperone present) mild skin irritation over labia; no signs of folliculitis; no bleeding Psychological: alert and cooperative; normal mood and affect.  Results for orders placed or performed during the hospital encounter of 05/08/24  POC Urinalysis Dipstick   Collection Time: 05/08/24  9:45 AM  Result Value Ref Range   Color, UA yellow yellow   Clarity, UA clear clear   Glucose, UA negative negative mg/dL   Bilirubin, UA negative negative   Ketones, POC UA negative negative mg/dL   Spec Grav, UA 8.984 8.989 - 1.025   Blood, UA negative negative   pH, UA 7.0 5.0 - 8.0    POC PROTEIN,UA negative negative, trace   Urobilinogen, UA 0.2 0.2 or 1.0 E.U./dL   Nitrite, UA Negative Negative   Leukocytes, UA Negative Negative    Labs Reviewed  POCT URINE DIPSTICK    Allergies[1]  Past Medical History:  Diagnosis Date   Acute vaginitis 03/31/2022   Anemia    h/o   Anginal pain    pt. takes nitrostat - as needed, hasn't had since 04/2012, sees Dr. 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 2013   Nausea without vomiting 08/07/2023   Neuromuscular disorder (HCC)    muscle spasms-back & arms    Other fatigue 02/21/2022   Palpitations 12/17/2023   Papanicolaou smear for cervical cancer screening 02/19/2023   Paresthesias 12/17/2023   Sensation of feeling cold 12/17/2023   SVT (supraventricular tachycardia) 01/22/2012   Tachycardia 12/17/2023   UTI (lower urinary tract infection) 01/20/2012   Vision changes 12/11/2021   Family History  Problem Relation Age of Onset   Heart attack Mother    Healthy Father    Colon  cancer Neg Hx    Esophageal cancer Neg Hx    Social History   Socioeconomic History   Marital status: Married    Spouse name: Not on file   Number of children: Not on file   Years of education: Not on file   Highest education level: GED or equivalent  Occupational History   Not on file  Tobacco Use   Smoking status: Never    Passive exposure: Never   Smokeless tobacco: Never  Vaping Use   Vaping status: Never Used  Substance and Sexual Activity   Alcohol use: No   Drug use: No   Sexual activity: Yes    Birth control/protection: Surgical  Other Topics Concern   Not on file  Social History Narrative    Not on file   Social Drivers of Health   Tobacco Use: Low Risk (05/08/2024)   Patient History    Smoking Tobacco Use: Never    Smokeless Tobacco Use: Never    Passive Exposure: Never  Financial Resource Strain: Medium Risk (08/13/2023)   Overall Financial Resource Strain (CARDIA)    Difficulty of Paying Living Expenses: Somewhat hard  Food Insecurity: No Food Insecurity (03/31/2024)   Epic    Worried About Radiation Protection Practitioner of Food in the Last Year: Never true    Ran Out of Food in the Last Year: Never true  Transportation Needs: No Transportation Needs (03/31/2024)   Epic    Lack of Transportation (Medical): No    Lack of Transportation (Non-Medical): No  Physical Activity: Insufficiently Active (03/31/2024)   Exercise Vital Sign    Days of Exercise per Week: 3 days    Minutes of Exercise per Session: 30 min  Stress: Stress Concern Present (03/31/2024)   [employer] of Occupational Health - Occupational Stress Questionnaire    Feeling of Stress: Rather much  Social Connections: Socially Integrated (03/31/2024)   Social Connection and Isolation Panel    Frequency of Communication with Friends and Family: More than three times a week    Frequency of Social Gatherings with Friends and Family: More than three times a week    Attends Religious Services: More than 4 times per year    Active Member of [employer] or Organizations: Yes    Attends Banker Meetings: More than 4 times per year    Marital Status: Married  Catering Manager Violence: Not At Risk (03/31/2024)   Epic    Fear of Current or Ex-Partner: No    Emotionally Abused: No    Physically Abused: No    Sexually Abused: No  Depression (PHQ2-9): High Risk (04/14/2024)   Depression (PHQ2-9)    PHQ-2 Score: 19  Alcohol Screen: Not on file  Housing: Unknown (03/31/2024)   Epic    Unable to Pay for Housing in the Last Year: No    Number of Times Moved in the Last Year: Not on file    Homeless in the Last Year: No   Utilities: Not At Risk (03/31/2024)   Epic    Threatened with loss of utilities: No  Health Literacy: Adequate Health Literacy (03/31/2024)   B1300 Health Literacy    Frequency of need for help with medical instructions: Never            [1]  Allergies Allergen Reactions   Amoxicillin Anaphylaxis and Hives    Has patient had a PCN reaction causing immediate rash, facial/tongue/throat swelling, SOB or lightheadedness with hypotension: Yes Has patient had a PCN reaction  causing severe rash involving mucus membranes or skin necrosis: Yes Has patient had a PCN reaction that required hospitalization No Has patient had a PCN reaction occurring within the last 10 years: No If all of the above answers are NO, then may proceed with Cephalosporin use.    Imitrex [Sumatriptan Base] Anaphylaxis and Hives   Penicillins Anaphylaxis and Hives    Has patient had a PCN reaction causing immediate rash, facial/tongue/throat swelling, SOB or lightheadedness with hypotension: Yes Has patient had a PCN reaction causing severe rash involving mucus membranes or skin necrosis: Yes Has patient had a PCN reaction that required hospitalization No Has patient had a PCN reaction occurring within the last 10 years: No If all of the above answers are NO, then may proceed with Cephalosporin use.    Tramadol Shortness Of Breath and Palpitations   Abilify  [Aripiprazole ]     seizures   Latuda  [Lurasidone ] Other (See Comments)    Suicidal ideations    Norco [Hydrocodone -Acetaminophen ] Other (See Comments)    Hallucinations    Percocet [Oxycodone -Acetaminophen ] Other (See Comments)    Hallucinations    Soy Allergy (Obsolete) Hives     Rolinda Rogue, MD 05/08/24 1027  "

## 2024-05-08 NOTE — ED Triage Notes (Signed)
 Patient states she has folliculitis and had a wax job in the pubic area in September and had issues in October and now the hair is coming back now and has inflammation where the hair follicles are. Patient states she poured Witch hazel on her pubic area yesterday. Patient states the area is worse. Patient also c/o dysuria and states it started after she used witch hazel.

## 2024-05-08 NOTE — Telephone Encounter (Signed)
 Pt called to advise that she went to [facility] today for issue not r/t psychiatry, but says that the nurse there told me I AM allergic to Abilify . Pt does not want to continue Abilify  and would like to restart Celexa , continue Hydroxyzine .   Last visit: 05/07/24 Next visit: 06/04/24

## 2024-05-13 ENCOUNTER — Encounter: Payer: Self-pay | Admitting: Family Medicine

## 2024-05-13 ENCOUNTER — Ambulatory Visit: Admitting: Family Medicine

## 2024-05-13 ENCOUNTER — Ambulatory Visit: Payer: Self-pay

## 2024-05-13 ENCOUNTER — Telehealth: Payer: Self-pay

## 2024-05-13 VITALS — BP 112/72 | HR 82 | Wt 178.6 lb

## 2024-05-13 DIAGNOSIS — L710 Perioral dermatitis: Secondary | ICD-10-CM | POA: Diagnosis not present

## 2024-05-13 MED ORDER — TACROLIMUS 0.03 % EX OINT: TOPICAL_OINTMENT | Freq: Two times a day (BID) | CUTANEOUS | 0 refills | Status: DC

## 2024-05-13 NOTE — Patient Instructions (Signed)
 VISIT SUMMARY:  Today, we discussed your facial flushing and dermatitis symptoms. You experience a burning sensation and redness, particularly when stressed or panicky, with bumps and welts around your face. These symptoms have been ongoing for over three months and were worse yesterday. We reviewed your current skincare routine and the timing of your Abilify  medication.  YOUR PLAN:  -PERIORAL DERMATITIS: Perioral dermatitis is a chronic skin condition that causes redness and burning around the mouth, often triggered by stress, food, and weather. We prescribed an ointment for flare-ups, which you should apply during episodes of burning and redness. Use CeraVe blue moisturizer on your damp face after showering, and consider a moisturizer with hydro urea for better efficacy. Be mindful of your triggers, including stress, food, weather, and age. Your prescription has been sent to [facility].  INSTRUCTIONS:  Please follow up with us  if your symptoms do not improve or if you have any concerns about your treatment plan.

## 2024-05-13 NOTE — Telephone Encounter (Signed)
 Copied from CRM #[id number]. Topic: Clinical - Prescription Issue >> May 13, 2024  3:31 PM Antwanette L wrote: Reason for CRM: The patient was seen by Dr. Vita today, and tacrolimus  (Protopic ) 0.03% ointment was prescribed. The patient reports that her insurance provider [facility]) will not cover this medication. The patient is requesting that the provider prescribe an alternative medication that is covered by her insurance. She requests that the prescription be sent to [facility].

## 2024-05-13 NOTE — Telephone Encounter (Signed)
 FYI Only or Action Required?: FYI only for provider: appointment scheduled on 1/20.  Patient was last seen in primary care on 03/07/2024 by Early, Camie BRAVO, NP.  Called Nurse Triage reporting Urticaria.  Symptoms began about a month ago.  Interventions attempted: OTC medications: Aloe vera.  Symptoms are: gradually worsening.  Triage Disposition: See PCP When Office is Open (Within 3 Days)  Patient/caregiver understands and will follow disposition?: Yes    Increased anxiety causes onset of small hives and bumpy red rash on face about 1x/day x1 month. Burning and mild itching. No SOB or swelling of lips, tongue or throat. No exposure to known allergens. No new medication. Occur with episodes increased anxiety. Scheduled appt with different provider at home office d/t no PCP availability within timeframe. Advised UC or ED for worsening symptoms.    Message from Eldorado B sent at 05/13/2024 10:35 AM EST  Reason for Triage: patient says this is triggered by anxiety/ hives on face/ burning/ itching.   Reason for Disposition  Hives persist > 1 week  Answer Assessment - Initial Assessment Questions 1. APPEARANCE: What does the rash look like?      Combo of small red bumps and welts  2. LOCATION: Where is the rash located?      Face, mainly on chin  3. NUMBER: How many hives are there?      Several  4. SIZE: How big are the hives? (e.g., inches, cm, compare to coins) Do they all look the same or do they vary in shape and size?      Small  5. ONSET: When did the hives begin? (e.g., hours or days ago)      1 month ago, intermittent. Occur daily.   6. ITCHING: Does it itch? If Yes, ask: How bad is the itch?  (e.g., none, mild, moderate, severe)     Mild  7. RECURRENT PROBLEM: Have you had hives before? If Yes, ask: When was the last time? and What happened that time?      1 month ago, intermittent. Occur daily.   8. TRIGGERS: Were you exposed to any new  food, plant, cosmetic product or animal just before the hives began?     Stress  9. OTHER SYMPTOMS: Do you have any other symptoms? (e.g., fever, tongue swelling, difficulty breathing, abdomen pain)     Burning when welts occur. Itching eyes.  10. PREGNANCY: Is there any chance you are pregnant? When was your last menstrual period?       Denies. Had hysterectomy.  Protocols used: Hives-A-AH

## 2024-05-13 NOTE — Progress Notes (Signed)
" ° °  Name: [name]   Date of Visit: 05/13/24   Date of last visit with me: Visit date not found   CHIEF COMPLAINT:  Chief Complaint  Patient presents with   Acute Visit    Intermittent hives and bumpy rash on face, feels like it is burning, been going on for around a month.       HPI:  Discussed the use of AI scribe software for clinical note transcription with the patient, who gave verbal consent to proceed.  History of Present Illness   [name] is a 44 year old female who presents with facial flushing and dermatitis symptoms.  She experiences facial flushing and a burning sensation, particularly when upset or panicky. Her face feels 'like I'm on fire' with bumps and sometimes welts, primarily around her face. These symptoms have persisted for more than three months, with episodes that come and go, and were notably worse the previous day.  She uses CeraVe face wash (green version) and Vaseline for her lips, which she finds helpful. No clear trigger related to food has been identified, but stress and possibly weather are potential triggers.  She has been experimenting with the timing of her Abilify  medication, initially taking it in the morning but switching to nighttime due to sleep disturbances.         OBJECTIVE:       04/14/2024    8:43 AM  Depression screen PHQ 2/9  Decreased Interest   Down, Depressed, Hopeless   PHQ - 2 Score   Altered sleeping   Tired, decreased energy   Change in appetite   Feeling bad or failure about yourself    Trouble concentrating   Moving slowly or fidgety/restless   Suicidal thoughts   PHQ-9 Score   Difficult doing work/chores      Information is confidential and restricted. Go to Review Flowsheets to unlock data.     BP Readings from Last 3 Encounters:  05/13/24 112/72  05/08/24 110/67  04/25/24 126/84    BP 112/72   Pulse 82   Wt 178 lb 9.6 oz (81 kg)   BMI 30.66 kg/m    Physical Exam           Physical Exam  Noted small raised bumps along the perioral area as well as some erythema.  Does appear to be consistent with perioral dermatitis. 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 Differential includes atopic dermatitis. - Prescribed ointment for flare-ups, apply during burning and redness. - Advised use of CeraVe blue moisturizer post-shower on damp face. - Recommended moisturizer with hydro urea for efficacy. - Educated on triggers: stress, food, weather, age. - Sent prescription to [facility].         Konner Saiz A. Vita MD [facility] "

## 2024-05-19 ENCOUNTER — Ambulatory Visit (HOSPITAL_COMMUNITY)

## 2024-05-20 ENCOUNTER — Telehealth (HOSPITAL_COMMUNITY): Payer: Self-pay | Admitting: *Deleted

## 2024-05-20 NOTE — Telephone Encounter (Signed)
 Pt called with c/o increased agitation and increasing irritation as well as being super sleepy, which she attributes to the Abilify  2 mg. Please review. Thank you.  Last visit: 05/07/24 Next visit: 06/04/24

## 2024-05-21 NOTE — Progress Notes (Unsigned)
 BH MD/PA/NP OP Progress Note  05/21/2024 10:56 AM [name]  MRN:  [id number]  Visit Diagnosis:  No diagnosis found.   Assessment:  [name] presents for follow-up evaluation. Today, 05/21/24, patient reports anxiety that has been progressively getting worse associated around the fear of having illnesses, taking medications and having side effects.  She has had similar behaviors in the past, resolved and year ago however they have progressed recently after she had an affair with an individual leading to stress and anxiety.  She is not depressed, is not actively passively homicidal or suicidal and has been having a lot of nervousness around medication and illnesses.  She is not using any substances including alcohol or cigarettes.  She was prescribed gabapentin  recently however she developed dizziness and she discontinued it.  In the past she has been on multiple medications, is likely highly reactive to the increased doses of medications.  Hydroxyzine  has helped her with anxiety and sleep and she is taking magnesium  over-the-counter for the same.  She has a good therapeutic relationship with a therapist, encouraged her to keep up with the therapy appointments as she was significantly benefit from it.  Educational counseling was done at the current visit however the patient was not amenable to being on any mood stabilizers currently.  She was amenable to trying Abilify  at a lower dose of 2 mg, discussed the risk benefits and side effects of medications.  She was highly passive towards any medications that would cause weight gain.  Will have her back in the clinic in 4 weeks.   Risk Assessment: An assessment of suicide and violence risk factors was performed as part of this evaluation and is not significantly changed from the last visit. While future psychiatric events cannot be accurately predicted, the patient does not currently require acute inpatient psychiatric care and does not  currently meet   involuntary commitment criteria. Patient was given contact information for crisis resources, behavioral health clinic and was instructed to call 911 for emergencies.   Plan: # Bipolar 2 disorder Past medication trials:  Status of problem: Current Interventions: -- Patient discontinued gabapentin  due to drowsiness -- Start Abilify  2 mg daily, does have a history of seizures however patient denied  # GAD/Panic attacks Past medication trials:  Status of problem: Current Interventions: -- Continue hydroxyzine  25 mg twice daily as needed -- Patient is using over-the-counter magnesium   Chief Complaint:  No chief complaint on file.  HPI:  Ms. Baez is a 44 year old female with a history of depression, ADHD, bipolar 2 disorder, panic disorder, personality disorder, previous suicidal ideation self-injurious behaviors presented to the clinic today for follow-up.  She has tried multiple medications in the past hydroxyzine , Buspar , Wellbutrin , Celexa , Effexor, gabapentin , Latuda  (made suicidal), Abilify  (disliked), Lamictal ,  Zyprexa  (dislikes), Ambien , Zoloft , and Trintellix.  She was recently prescribed gabapentin  however stopped taking it after experiencing dizziness. Today, she presented to the clinic unaccompanied and reported that I am all over the place, anxiety is bad .  Reported that she has been googling I look all the stuff up and I think I have this, thinking something is wrong with me, something is wrong with my face .  Reported that she has been looking for her medications, has been watching [employer].  Reported that I am losing control .  Reported she is currently not on medications apart from hydroxyzine  and magnesium  it helps ignores .  Reported that I am scared of my psychiatric medications, they  make me suicidal .  Reported that she was diagnosed with bipolar 2 in 2012, I had a lot of manic episodes .  Reported that she was sleeping less  and talking a lot more during those episodes.  She reported taking the past she has been impulsively tried to cut but not recently.  Also stated that 2 months ago I had an affair .  And things got worse after that.  Stated I think I have a disease .  She currently reported good appetite, stated that she sleeps from 9:30 PM till 5 AM and has been taking hydroxyzine  before bedtime and tea in the morning.  He reported that I am not depressed it is just anxiety .  She denied any active or passive SI/HI/AVH.  Stated that it is more nervousness .  Reported that she has recently started school for early childhood, autism and she is looking forward to it.  She denied using any substances including alcohol or cigarettes. Stated that she does not want to be on any medication that causes weight gain, discussed about Latuda  and Lamictal  and the patient declined both of them.  Discussed about Abilify  however the patient stated that she would like to start at a lower dose, she has a history of seizures however she was not sure if that was being caused by Zoloft  or Abilify  and she was amenable to starting it.  She is also undergoing therapy has good therapeutic relationship, encouraged her to keep up with the therapy appointments. Will have her back in the clinic in 4 weeks.   Past Psychiatric History: Depression, ADHD, Bipolar 2, Panic Disorder with agoraphobia, borderline personality disorder, SI (admitted in 2012 for jumping out of the building), self mutualization (cutting)   Past Medical History:  Past Medical History:  Diagnosis Date   Acute vaginitis 03/31/2022   Anemia    h/o   Anginal pain    pt. takes nitrostat - as needed, hasn't had since 04/2012, sees Dr. 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 2013   Nausea without vomiting 08/07/2023   Neuromuscular disorder (HCC)    muscle spasms-back & arms    Other fatigue 02/21/2022   Palpitations 12/17/2023   Papanicolaou smear for cervical cancer screening 02/19/2023   Paresthesias 12/17/2023   Sensation of feeling cold 12/17/2023   SVT (supraventricular tachycardia) 01/22/2012   Tachycardia 12/17/2023   UTI (lower urinary tract infection) 01/20/2012   Vision changes 12/11/2021    Past Surgical History:  Procedure Laterality Date   ABDOMINAL HYSTERECTOMY     PERIPHERALLY INSERTED CENTRAL CATHETER INSERTION     during event of Meningitis- then d/c'd to home /w & treated /w antibiotics    SUBOCCIPITAL CRANIECTOMY CERVICAL LAMINECTOMY N/A 07/22/2012   Procedure: SUBOCCIPITAL CRANIECTOMY CERVICAL LAMINECTOMY/DURAPLASTY;  Surgeon: Lamar LELON Peaches, MD;  Location: [facility];  Service: Neurosurgery;  Laterality: N/A;  Suboccipital craniectomy with upper cervial laminectomy with duroplasty    TUBAL LIGATION     VAGINAL DELIVERY     x3   WISDOM TOOTH EXTRACTION      Family Psychiatric History: Cousin drug use,  anxiety on paternal family, father alcohol use   Family History:  Family History  Problem Relation Age of Onset   Heart attack Mother    Healthy Father    Colon cancer Neg Hx    Esophageal cancer Neg Hx     Social History:  Social History   Socioeconomic History   Marital status: Married    Spouse name: Not on file   Number of children: Not on file   Years of education: Not on file   Highest education level: GED or equivalent  Occupational History   Not on file  Tobacco Use   Smoking status: Never    Passive exposure: Never   Smokeless tobacco: Never  Vaping Use   Vaping status: Never Used  Substance and Sexual Activity   Alcohol use: No   Drug use: No   Sexual activity: Yes     Birth control/protection: Surgical  Other Topics Concern   Not on file  Social History Narrative   Not on file   Social Drivers of Health   Tobacco Use: Low Risk (05/13/2024)   Patient History    Smoking Tobacco Use: Never    Smokeless Tobacco Use: Never    Passive Exposure: Never  Financial Resource Strain: Medium Risk (08/13/2023)   Overall Financial Resource Strain (CARDIA)    Difficulty of Paying Living Expenses: Somewhat hard  Food Insecurity: No Food Insecurity (03/31/2024)   Epic    Worried About Radiation Protection Practitioner of Food in the Last Year: Never true    Ran Out of Food in the Last Year: Never true  Transportation Needs: No Transportation Needs (03/31/2024)   Epic    Lack of Transportation (Medical): No    Lack of Transportation (Non-Medical): No  Physical Activity: Insufficiently Active (03/31/2024)   Exercise Vital Sign    Days of Exercise per Week: 3 days    Minutes of Exercise per Session: 30 min  Stress: Stress Concern Present (03/31/2024)   [employer] of Occupational Health - Occupational Stress Questionnaire    Feeling of Stress: Rather much  Social Connections: Socially Integrated (03/31/2024)   Social Connection and Isolation Panel    Frequency of Communication with Friends and Family: More than three times a week    Frequency of Social Gatherings with Friends and Family: More than three times a week    Attends Religious Services: More than 4 times per year    Active Member of Clubs or Organizations: Yes    Attends Banker Meetings: More than 4 times per year    Marital Status: Married  Depression (PHQ2-9): High Risk (04/14/2024)   Depression (PHQ2-9)    PHQ-2 Score: 19  Alcohol Screen: Not on file  Housing: Unknown (03/31/2024)   Epic    Unable to Pay for Housing in the Last Year: No    Number of Times Moved in the Last Year: Not on file    Homeless in the Last Year: No  Utilities: Not At Risk (03/31/2024)   Epic    Threatened with loss of  utilities: No  Health Literacy: Adequate Health Literacy (03/31/2024)   B1300 Health Literacy    Frequency of need for help with medical instructions: Never    Allergies: Allergies[1]  Current Medications: Current Outpatient Medications  Medication Sig Dispense Refill   albuterol  (VENTOLIN  HFA) 108 (90 Base) MCG/ACT inhaler INHALE 1-2 PUFFS BY MOUTH EVERY 6 HOURS AS NEEDED FOR WHEEZE OR SHORTNESS OF BREATH 18 each 3   ammonium  lactate (AMLACTIN) 12 % cream Apply 1 Application topically as needed for dry skin. (Patient not taking: Reported on 05/13/2024) 385 g 2   ARIPiprazole  (ABILIFY ) 2 MG tablet Take 1 tablet (2 mg total) by mouth daily. 30 tablet 0   benzoyl peroxide  5 % external liquid Cleanse affected area twice a day, pat dry and apply prescribed clindamycin  gel (Patient not taking: Reported on 05/13/2024) 148 g 0   Cholecalciferol (VITAMIN D3) 1.25 MG (50000 UT) CAPS Take 1 capsule by mouth once a week.     cyclobenzaprine  (FLEXERIL ) 5 MG tablet Take 1 tablet up to twice a day as needed for muscle spasms and headache not controlled with other medication. Avoid taking daily. 30 tablet 1   dicyclomine  (BENTYL ) 20 MG tablet Take 1 tablet (20 mg total) by mouth 3 (three) times daily as needed for spasms (Belly cramping.). 90 tablet 3   hydrOXYzine  (VISTARIL ) 25 MG capsule Take 1 capsule (25 mg total) by mouth 2 (two) times daily as needed for anxiety. 60 capsule 0   ibuprofen  (ADVIL ) 800 MG tablet TAKE 1 TABLET BY MOUTH 2 TIMES DAILY AS NEEDED. 60 tablet 1   pantoprazole  (PROTONIX ) 40 MG tablet Take 1 tablet (40 mg total) by mouth daily. 90 tablet 1   tacrolimus  (PROTOPIC ) 0.03 % ointment Apply topically 2 (two) times daily. 100 g 0   No current facility-administered medications for this visit.     Musculoskeletal: Strength & Muscle Tone: within normal limits Gait & Station: normal Patient leans: N/A  Psychiatric Specialty Exam: There were no vitals taken for this visit.There is no  height or weight on file to calculate BMI. Review of Systems   General Appearance: Neat and Well Groomed  Eye Contact:  Good  Speech:  Clear and Coherent and Normal Rate; no latency on interview today  Volume:  Normal  Mood: Eythymic  Affect:  Euthymic; calm; mildly anxious  Thought Content: Normal  Suicidal Thoughts:  No  Homicidal Thoughts:  No  Thought Process:  Goal Directed and Linear  Orientation:  Full (Time, Place, and Person)    Memory:  Grossly intact   Judgment:  Fair  Insight:  Fair  Concentration:  Concentration: Fair  Recall:  not formally assessed   Fund of Knowledge: Good  Language: Good  Psychomotor Activity:  Normal  Akathisia:  No  AIMS (if indicated): not done  Assets:  Communication Skills Desire for Improvement Housing Physical Health Talents/Skills  ADL's:  Intact  Cognition: WNL  Sleep:  Fair     Metabolic Disorder Labs: Lab Results  Component Value Date   HGBA1C 5.8 (H) 12/17/2023   No results found for: PROLACTIN Lab Results  Component Value Date   CHOL 141 12/17/2023   TRIG 65 12/17/2023   HDL 57 12/17/2023   CHOLHDL 2.5 12/17/2023   VLDL 7 09/29/2008   LDLCALC 71 12/17/2023   LDLCALC 70 02/01/2023   Lab Results  Component Value Date   TSH 1.300 12/17/2023   TSH 1.600 02/01/2023    Therapeutic Level Labs: Lab Results  Component Value Date   LITHIUM  <0.06 (L) 04/18/2017   No results found for: VALPROATE No results found for: CBMZ   Screenings: GAD-7    Flowsheet Row Office Visit from 04/14/2024 in [facility] Office Visit from 12/17/2023 in [facility] Office Visit from 11/14/2023 in [facility] Office Visit from 10/10/2022 in [facility] Office Visit from 11/03/2021 in [facility] at [facility]  Total GAD-7 Score 15 13 16 10 12    PHQ2-9    Flowsheet Row Office Visit from 04/14/2024 in [facility] Clinical Support from 03/31/2024 in [facility] ED from 03/14/2024 in [facility] ED from 03/12/2024 in [facility] Office Visit from 12/17/2023 in [facility]  PHQ-2 Total Score 4 2 2 2 1   PHQ-9 Total Score 19 4 4 4 8    Flowsheet Row UC from 05/08/2024 in [facility] ED from 05/03/2024 in [facility] UC from 04/25/2024 in [facility] at [facility] RISK CATEGORY No Risk No Risk No Risk    Collaboration of Care: Collaboration of Care: Medication Management AEB Dr. Carvin  Patient/Guardian was advised Release of Information must be obtained prior to any record release in order to collaborate their care with an outside provider. Patient/Guardian was advised if they have not already done so to contact the registration department to sign all necessary forms in order for us  to release information regarding their care.   Consent: Patient/Guardian gives verbal consent for treatment and assignment of benefits for services provided during this visit. Patient/Guardian expressed understanding and agreed to proceed.    Kelsay Haggard, MD 05/21/2024, 10:56 AM      [1]  Allergies Allergen Reactions   Amoxicillin Anaphylaxis and Hives    Has patient had a PCN reaction causing immediate rash, facial/tongue/throat swelling, SOB or lightheadedness with hypotension: Yes Has patient had a PCN reaction causing severe rash involving mucus membranes or skin necrosis: Yes Has patient had a PCN reaction that required hospitalization No Has patient had a PCN reaction occurring within the last 10 years: No If all of the above answers are NO, then may proceed with Cephalosporin use.    Imitrex [Sumatriptan Base] Anaphylaxis and Hives   Penicillins Anaphylaxis and Hives    Has patient had a PCN reaction causing immediate rash, facial/tongue/throat  swelling, SOB or lightheadedness with hypotension: Yes Has patient had a PCN reaction causing severe rash involving mucus membranes or skin necrosis: Yes Has patient had a PCN reaction that required hospitalization No Has patient had a PCN reaction occurring within the last 10 years: No If all of the above answers are NO, then may proceed with Cephalosporin use.    Tramadol Shortness Of Breath and Palpitations   Abilify  [Aripiprazole ]     seizures   Latuda  [email or web address] ] Other (See Comments)    Suicidal ideations    Norco [Hydrocodone -Acetaminophen ] Other (See Comments)    Hallucinations    Percocet [Oxycodone -Acetaminophen ] Other (See Comments)    Hallucinations    Soy Allergy (Obsolete) Hives

## 2024-05-22 ENCOUNTER — Encounter: Payer: Self-pay | Admitting: Family Medicine

## 2024-05-22 ENCOUNTER — Ambulatory Visit: Payer: Self-pay

## 2024-05-22 ENCOUNTER — Ambulatory Visit: Admitting: Family Medicine

## 2024-05-22 VITALS — BP 118/80 | HR 84 | Temp 97.8°F | Ht 64.0 in | Wt 182.4 lb

## 2024-05-22 DIAGNOSIS — N898 Other specified noninflammatory disorders of vagina: Secondary | ICD-10-CM | POA: Diagnosis not present

## 2024-05-22 DIAGNOSIS — F411 Generalized anxiety disorder: Secondary | ICD-10-CM | POA: Diagnosis not present

## 2024-05-22 DIAGNOSIS — L739 Follicular disorder, unspecified: Secondary | ICD-10-CM

## 2024-05-22 NOTE — Patient Instructions (Addendum)
 Please be sure to contact your psychiatrist today to come up with a plan, since you stopped taking your abilify  a few days ago.   Your vaginal exam was completely normal. The area of your itching was at the labia majora. Since everything has been getting better, you can just give things more time. You can continue to use the Dermoplast spray as needed for itching (it has menthol that makes it feel cool, and has numbing medicine). If the itching is worse, you can try over-the-counter hydrocortisone  1% cream twice a day, just to the itchy area. I do not think you will need this. Continue to keep the area clean (regular gentle soaps), and keep the area dry.

## 2024-05-22 NOTE — Progress Notes (Signed)
 Chief Complaint  Patient presents with   Folliculitis    Has been going on x 4 months. Waxed around 4 months ago and has been ongoing-not sure what to use. Burns and is itchy.    Patient presents for evaluation of ongoing vaginal irritation and itching.  She has been dealing with vaginal folliculitis for several months (started after waxing in September, she reports pain started in October).  Topical betamethasone  and ketoconazole  were initially prescribed, and were too harsh, made things worse. 12/19 she went to ER for worsening symptoms, and was treated with a course of doxycycine for 7 days. She went back to UC on 1/2 for ongoing issues with folliculitis.  She was prescribed benzoyl peroxide  and clindamycin  gel. She was advised to avoid fragrances, keep the skin clean and dry. She was back in the UC on 1/15 with irritation. She had used some witch hazel to the vaginal area, which flared things up.  She was advised there wasn't any evidence of skin or follicle infection, but noted some vaginal irritation.  Advised to leave things alone for a week or two, to give skin time to heal.  She was advised she could use dermoplast spray prn (OTC benzocaine/menthol).    She presents today due to some itching and burning, but overall is improving.  She states that the pain is much better. She notes that it is starting to itch more. She used the spray about once a day, and got some benefit for a few hours. Hasn't used the spray in a few days.  She has been using a [employer] (without fragrance), and warm water.  She used some warm salt water as well.  She was last seen in our office by Dr. Vita on 1/20 with perioral dermatitis, he prescribed topical tacrolimus . This was too expensive, so she never picked it up. The rash comes and goes.  The Cerevie has been helping. She thinks the rash is related to her anxiety flaring. Currently denies any facial symptoms.  She reports stopping the abilify  3 days ago,  started 1/15 by her psychiatrist.  She felt sedated when taking this medication, even when she switched to taking it at night. She was also concerned about some weight gain while taking it. She admits that her anxiety is not well controlled.  She is also sedated by the hydroxyzine , but it is a capsule so she can't take a lower dose.     PMH, PSH, SH reviewed  Outpatient Encounter Medications as of 05/22/2024  Medication Sig Note   albuterol  (VENTOLIN  HFA) 108 (90 Base) MCG/ACT inhaler INHALE 1-2 PUFFS BY MOUTH EVERY 6 HOURS AS NEEDED FOR WHEEZE OR SHORTNESS OF BREATH 05/22/2024: As needed prior to exercise, not recently     ammonium lactate  (AMLACTIN) 12 % cream Apply 1 Application topically as needed for dry skin. 05/22/2024: As needed   Cholecalciferol (VITAMIN D3) 1.25 MG (50000 UT) CAPS Take 1 capsule by mouth once a week.    hydrOXYzine  (VISTARIL ) 25 MG capsule Take 1 capsule (25 mg total) by mouth 2 (two) times daily as needed for anxiety. 05/22/2024: As needed   ibuprofen  (ADVIL ) 800 MG tablet TAKE 1 TABLET BY MOUTH 2 TIMES DAILY AS NEEDED. 05/22/2024: As needed   pantoprazole  (PROTONIX ) 40 MG tablet Take 1 tablet (40 mg total) by mouth daily.    [DISCONTINUED] dicyclomine  (BENTYL ) 20 MG tablet Take 1 tablet (20 mg total) by mouth 3 (three) times daily as needed for spasms (Belly cramping.).  11/14/2023: Prn, took 8pm last night   cyclobenzaprine  (FLEXERIL ) 5 MG tablet Take 1 tablet up to twice a day as needed for muscle spasms and headache not controlled with other medication. Avoid taking daily. (Patient not taking: Reported on 05/22/2024)    [DISCONTINUED] ARIPiprazole  (ABILIFY ) 2 MG tablet Take 1 tablet (2 mg total) by mouth daily.    [DISCONTINUED] benzoyl peroxide  5 % external liquid Cleanse affected area twice a day, pat dry and apply prescribed clindamycin  gel (Patient not taking: Reported on 05/13/2024)    [DISCONTINUED] tacrolimus  (PROTOPIC ) 0.03 % ointment Apply topically 2 (two) times  daily.    No facility-administered encounter medications on file as of 05/22/2024.   Allergies  Allergen Reactions   Amoxicillin Anaphylaxis and Hives    Has patient had a PCN reaction causing immediate rash, facial/tongue/throat swelling, SOB or lightheadedness with hypotension: Yes Has patient had a PCN reaction causing severe rash involving mucus membranes or skin necrosis: Yes Has patient had a PCN reaction that required hospitalization No Has patient had a PCN reaction occurring within the last 10 years: No If all of the above answers are NO, then may proceed with Cephalosporin use.    Imitrex [Sumatriptan Base] Anaphylaxis and Hives   Penicillins Anaphylaxis and Hives    Has patient had a PCN reaction causing immediate rash, facial/tongue/throat swelling, SOB or lightheadedness with hypotension: Yes Has patient had a PCN reaction causing severe rash involving mucus membranes or skin necrosis: Yes Has patient had a PCN reaction that required hospitalization No Has patient had a PCN reaction occurring within the last 10 years: No If all of the above answers are NO, then may proceed with Cephalosporin use.    Tramadol Shortness Of Breath and Palpitations   Abilify  [Aripiprazole ]     seizures   Latuda  [Lurasidone ] Other (See Comments)    Suicidal ideations    Norco [Hydrocodone -Acetaminophen ] Other (See Comments)    Hallucinations    Percocet [Oxycodone -Acetaminophen ] Other (See Comments)    Hallucinations    Soy Allergy (Obsolete) Hives     ROS:  Denies any fever, nausea, vomiting, diarrhea. Denies any pelvic pain, abdominal pain, vaginal discharge. Denies vaginal bleeding (s/p hysterectomy). She notes some vaginal dryness. Denies URI symptoms, just slight head congestion, known allergies. +anxiety isn't well controlled.    PHYSICAL EXAM:  BP 118/80   Pulse 84   Temp 97.8 F (36.6 C) (Skin)   Ht 5' 4 (1.626 m)   Wt 182 lb 6.4 oz (82.7 kg)   BMI 31.31 kg/m    Wt Readings from Last 3 Encounters:  05/22/24 182 lb 6.4 oz (82.7 kg)  05/13/24 178 lb 9.6 oz (81 kg)  04/11/24 180 lb 12.4 oz (82 kg)   Pleasant female. She is alert and oriented. She is somewhat anxious, full range of affect. HEENT: conjunctiva and sclera are clear, EOMI Neck: no lymphadenopathy Heart: regular rate and rhythm Lungs: clear bilaterally Back: no spinal or CVA tenderness Abdomen: soft, nontender GU: normal external genitalia.  No areas of inflammation, cysts, tenderness.  Area of her itching is along the edge of the labia majora, but skin appears entirely normal in this location.  There is no e/o discharge or other abnormalities. Extremities: no edema Neuro: alert and oriented, cranial nerves grossly intact, normal gait. Skin: normal turgor.  Facial skin is entirely normal, without any rash, dryness, bumps.   ASSESSMENT/PLAN:  Vaginal itching - itching is external.  Exam is normal. no evidence of any inflammation or  infection. Reassured. Supportive measures reviewed  Folliculitis - This appears to have finally resolved.  She hasn't been shaving, pain has resolved.  Pt reassurred. reviewed cleansing regimen  Generalized anxiety disorder - and bipolar d/o. Pt recently stopped the abilify  that was started 2 weeks ago. Encouraged her to f/u with her psych re: this medication and anxiety  I spent 33 minutes dedicated to the care of this patient, including pre-visit review of records, face to face time, post-visit ordering of testing and documentation.  Your vaginal exam was completely normal. The area of your itching was at the labia majora. Since everything has been getting better, you can just give things more time. You can continue to use the Dermoplast spray as needed for itching (it has menthol that makes it feel cool, and has numbing medicine). If the itching is worse, you can try over-the-counter hydrocortisone  1% cream twice a day, just to the itchy area. I do  not think you will need this. Continue to keep the area clean (regular gentle soaps), and keep the area dry.

## 2024-05-22 NOTE — Telephone Encounter (Signed)
 FYI Only or Action Required?: FYI only for provider: appointment scheduled on 1/29.  Patient was last seen in primary care on 05/13/2024 by Vita Morrow, MD.  Called Nurse Triage reporting Vaginal Itching.  Symptoms began several months ago.  Interventions attempted: Rest, hydration, or home remedies.  Symptoms are: gradually improving.  Triage Disposition: See Physician Within 24 Hours  Patient/caregiver understands and will follow disposition?: Yes  Copied from CRM #[id number]. Topic: Clinical - Medical Advice >> May 22, 2024 10:19 AM Travis F wrote: Reason for CRM: Patient is calling in wanting advice on what to use around the irritation around her vagina. Patient says when she went to Urgent Care she was told not use anything but it's been a few weeks and it's healing, but she wants to know what else she can do to help it. Reason for Disposition  [1] MILD to MODERATE vaginal pain AND [2] present > 24 hours  (Exception: Chronic pain.)  Answer Assessment - Initial Assessment Questions Vaginal folliculitis for the last few month.was told it could take up to 6 months. Creams given in the early stages were to harsh. Was seen in UC 2 weeks ago and was told not to use anything, just keep clean. Using salt water.   Questioning if can now start using aloe vera, witch hazel, or neosporin on the site.   Appt with PCP office today to assess.   1. SYMPTOM: What's the main symptom you're concerned about? (e.g., pain, itching, dryness)     Itching  2. LOCATION: Where is the  folliculitis located? (e.g., inside/outside, left/right)     Follicles around the vaginal  3. ONSET: When did the  folliculitis  start?     End of October  4. PAIN: Is there any pain? If Yes, ask: How bad is it? (Scale: 1-10; mild, moderate, severe)     Irritation- 5/10 5. ITCHING: Is there any itching? If Yes, ask: How bad is it? (Scale: 1-10; mild, moderate, severe)     4-5/10 6. CAUSE: What do you think  is causing the discharge? Have you had the same problem before? What happened then?     Since October 7. OTHER SYMPTOMS: Do you have any other symptoms? (e.g., fever, itching, vaginal bleeding, pain with urination, injury to genital area, vaginal foreign body)     Denies  Protocols used: Vaginal Symptoms-A-AH

## 2024-05-23 ENCOUNTER — Telehealth (HOSPITAL_COMMUNITY): Payer: Self-pay | Admitting: *Deleted

## 2024-05-23 NOTE — Telephone Encounter (Signed)
 Pt called to advise that she has tired to be compliant with the Abilify , even breaking tablet, but still feels tired all the time. Pt says she just cannot take the medicine any longer. She is asking for an antidepressant that she hasn't tried like Lexapro  or Cymbalta. Pt is also requesting a lower dose of the Hydroxyzine ; currently taking 25 mg. Please review.   Last visit: 05/19/24 Next visit: 06/04/24

## 2024-05-26 ENCOUNTER — Other Ambulatory Visit (HOSPITAL_COMMUNITY): Payer: Self-pay

## 2024-05-26 DIAGNOSIS — F410 Panic disorder [episodic paroxysmal anxiety] without agoraphobia: Secondary | ICD-10-CM

## 2024-05-26 MED ORDER — HYDROXYZINE HCL 10 MG PO TABS: 10.0000 mg | ORAL_TABLET | Freq: Two times a day (BID) | ORAL | 0 refills | Status: AC | PRN

## 2024-06-04 ENCOUNTER — Ambulatory Visit (HOSPITAL_COMMUNITY)

## 2025-04-07 ENCOUNTER — Ambulatory Visit
